# Patient Record
Sex: Female | Born: 1975 | Hispanic: Yes | Smoking: Never smoker
Health system: Southern US, Community
[De-identification: ages and names within clinical notes are randomized; demographics above are authoritative.]

## PROBLEM LIST (undated history)

## (undated) DIAGNOSIS — K3184 Gastroparesis: Secondary | ICD-10-CM

## (undated) DIAGNOSIS — I10 Essential (primary) hypertension: Secondary | ICD-10-CM

## (undated) DIAGNOSIS — G35 Multiple sclerosis: Secondary | ICD-10-CM

## (undated) DIAGNOSIS — R569 Unspecified convulsions: Secondary | ICD-10-CM

## (undated) DIAGNOSIS — K592 Neurogenic bowel, not elsewhere classified: Secondary | ICD-10-CM

## (undated) DIAGNOSIS — G43909 Migraine, unspecified, not intractable, without status migrainosus: Secondary | ICD-10-CM

## (undated) DIAGNOSIS — C439 Malignant melanoma of skin, unspecified: Secondary | ICD-10-CM

## (undated) DIAGNOSIS — E669 Obesity, unspecified: Secondary | ICD-10-CM

## (undated) DIAGNOSIS — N319 Neuromuscular dysfunction of bladder, unspecified: Secondary | ICD-10-CM

## (undated) DIAGNOSIS — E282 Polycystic ovarian syndrome: Secondary | ICD-10-CM

## (undated) DIAGNOSIS — I209 Angina pectoris, unspecified: Secondary | ICD-10-CM

## (undated) DIAGNOSIS — G629 Polyneuropathy, unspecified: Secondary | ICD-10-CM

## (undated) DIAGNOSIS — C55 Malignant neoplasm of uterus, part unspecified: Secondary | ICD-10-CM

## (undated) HISTORY — DX: Malignant melanoma of skin, unspecified: C43.9

## (undated) HISTORY — PX: COLON SURGERY: SHX602

## (undated) HISTORY — DX: Obesity, unspecified: E66.9

## (undated) HISTORY — DX: Multiple sclerosis: G35

## (undated) HISTORY — PX: BARIATRIC SURGERY: SHX1103

## (undated) HISTORY — DX: Neuromuscular dysfunction of bladder, unspecified: N31.9

## (undated) HISTORY — DX: Malignant neoplasm of uterus, part unspecified: C55

## (undated) HISTORY — DX: Neurogenic bowel, not elsewhere classified: K59.2

## (undated) HISTORY — DX: Polycystic ovarian syndrome: E28.2

## (undated) HISTORY — DX: Essential (primary) hypertension: I10

## (undated) HISTORY — PX: SKIN CANCER EXCISION: SHX779

## (undated) HISTORY — PX: DILATION AND CURETTAGE OF UTERUS: SHX78

## (undated) HISTORY — DX: Migraine, unspecified, not intractable, without status migrainosus: G43.909

## (undated) HISTORY — DX: Polyneuropathy, unspecified: G62.9

## (undated) HISTORY — PX: ABDOMINAL HYSTERECTOMY: SHX81

## (undated) HISTORY — PX: CHOLECYSTECTOMY: SHX55

## (undated) HISTORY — DX: Gastroparesis: K31.84

---

## 2004-03-25 ENCOUNTER — Emergency Department (HOSPITAL_COMMUNITY): Admission: EM | Admit: 2004-03-25 | Discharge: 2004-03-26 | Payer: Self-pay | Admitting: Emergency Medicine

## 2004-03-26 ENCOUNTER — Emergency Department (HOSPITAL_COMMUNITY): Admission: EM | Admit: 2004-03-26 | Discharge: 2004-03-26 | Payer: Self-pay | Admitting: Emergency Medicine

## 2004-08-15 ENCOUNTER — Encounter (INDEPENDENT_AMBULATORY_CARE_PROVIDER_SITE_OTHER): Payer: Self-pay | Admitting: *Deleted

## 2004-08-16 ENCOUNTER — Inpatient Hospital Stay (HOSPITAL_COMMUNITY): Admission: AD | Admit: 2004-08-16 | Discharge: 2004-08-17 | Payer: Self-pay | Admitting: Surgery

## 2004-10-09 ENCOUNTER — Encounter (INDEPENDENT_AMBULATORY_CARE_PROVIDER_SITE_OTHER): Payer: Self-pay | Admitting: *Deleted

## 2004-10-09 ENCOUNTER — Ambulatory Visit (HOSPITAL_COMMUNITY): Admission: RE | Admit: 2004-10-09 | Discharge: 2004-10-09 | Payer: Self-pay | Admitting: *Deleted

## 2005-05-21 ENCOUNTER — Ambulatory Visit (HOSPITAL_COMMUNITY): Admission: RE | Admit: 2005-05-21 | Discharge: 2005-05-21 | Payer: Self-pay | Admitting: Obstetrics & Gynecology

## 2005-05-22 ENCOUNTER — Encounter (INDEPENDENT_AMBULATORY_CARE_PROVIDER_SITE_OTHER): Payer: Self-pay | Admitting: *Deleted

## 2005-08-20 ENCOUNTER — Emergency Department (HOSPITAL_COMMUNITY): Admission: EM | Admit: 2005-08-20 | Discharge: 2005-08-20 | Payer: Self-pay | Admitting: Emergency Medicine

## 2006-03-14 ENCOUNTER — Ambulatory Visit: Payer: Self-pay | Admitting: Internal Medicine

## 2006-03-18 ENCOUNTER — Ambulatory Visit: Payer: Self-pay | Admitting: Internal Medicine

## 2006-03-22 ENCOUNTER — Ambulatory Visit: Payer: Self-pay | Admitting: Endocrinology

## 2006-04-01 ENCOUNTER — Encounter: Admission: RE | Admit: 2006-04-01 | Discharge: 2006-04-01 | Payer: Self-pay | Admitting: Internal Medicine

## 2006-04-08 ENCOUNTER — Ambulatory Visit: Payer: Self-pay | Admitting: Internal Medicine

## 2006-04-17 ENCOUNTER — Ambulatory Visit: Payer: Self-pay | Admitting: Internal Medicine

## 2006-05-31 ENCOUNTER — Encounter: Admission: RE | Admit: 2006-05-31 | Discharge: 2006-05-31 | Payer: Self-pay | Admitting: Obstetrics & Gynecology

## 2006-06-03 ENCOUNTER — Encounter: Admission: RE | Admit: 2006-06-03 | Discharge: 2006-06-03 | Payer: Self-pay | Admitting: Obstetrics & Gynecology

## 2006-07-08 ENCOUNTER — Ambulatory Visit: Payer: Self-pay | Admitting: Internal Medicine

## 2006-10-21 ENCOUNTER — Ambulatory Visit: Payer: Self-pay | Admitting: Internal Medicine

## 2006-11-25 ENCOUNTER — Ambulatory Visit: Payer: Self-pay | Admitting: Internal Medicine

## 2006-11-29 ENCOUNTER — Encounter: Payer: Self-pay | Admitting: Internal Medicine

## 2006-11-29 LAB — CONVERTED CEMR LAB

## 2007-01-07 ENCOUNTER — Ambulatory Visit: Payer: Self-pay | Admitting: Internal Medicine

## 2007-01-16 ENCOUNTER — Ambulatory Visit: Payer: Self-pay | Admitting: Internal Medicine

## 2007-04-04 ENCOUNTER — Ambulatory Visit: Payer: Self-pay | Admitting: Internal Medicine

## 2007-06-24 ENCOUNTER — Ambulatory Visit: Payer: Self-pay | Admitting: Internal Medicine

## 2007-06-27 ENCOUNTER — Encounter: Payer: Self-pay | Admitting: Internal Medicine

## 2007-06-27 DIAGNOSIS — E119 Type 2 diabetes mellitus without complications: Secondary | ICD-10-CM | POA: Insufficient documentation

## 2007-06-27 DIAGNOSIS — I10 Essential (primary) hypertension: Secondary | ICD-10-CM | POA: Insufficient documentation

## 2007-06-27 DIAGNOSIS — J45909 Unspecified asthma, uncomplicated: Secondary | ICD-10-CM | POA: Insufficient documentation

## 2007-06-27 DIAGNOSIS — K219 Gastro-esophageal reflux disease without esophagitis: Secondary | ICD-10-CM | POA: Insufficient documentation

## 2007-08-18 LAB — CONVERTED CEMR LAB: Pap Smear: NORMAL

## 2007-12-31 ENCOUNTER — Ambulatory Visit: Payer: Self-pay | Admitting: Internal Medicine

## 2007-12-31 DIAGNOSIS — J069 Acute upper respiratory infection, unspecified: Secondary | ICD-10-CM | POA: Insufficient documentation

## 2007-12-31 LAB — CONVERTED CEMR LAB
ALT: 167 units/L — ABNORMAL HIGH (ref 0–35)
AST: 117 units/L — ABNORMAL HIGH (ref 0–37)
Albumin: 3.8 g/dL (ref 3.5–5.2)
Alkaline Phosphatase: 67 units/L (ref 39–117)
BUN: 3 mg/dL — ABNORMAL LOW (ref 6–23)
Basophils Absolute: 0 10*3/uL (ref 0.0–0.1)
Basophils Relative: 0.2 % (ref 0.0–1.0)
Bilirubin, Direct: 0.2 mg/dL (ref 0.0–0.3)
CO2: 29 meq/L (ref 19–32)
Calcium: 9.2 mg/dL (ref 8.4–10.5)
Chloride: 102 meq/L (ref 96–112)
Cholesterol: 265 mg/dL (ref 0–200)
Creatinine, Ser: 0.6 mg/dL (ref 0.4–1.2)
Creatinine,U: 233.8 mg/dL
Direct LDL: 203.2 mg/dL
Eosinophils Absolute: 0.2 10*3/uL (ref 0.0–0.6)
Eosinophils Relative: 2.4 % (ref 0.0–5.0)
GFR calc Af Amer: 151 mL/min
GFR calc non Af Amer: 125 mL/min
Glucose, Bld: 147 mg/dL — ABNORMAL HIGH (ref 70–99)
HCT: 41.2 % (ref 36.0–46.0)
HDL: 36.7 mg/dL — ABNORMAL LOW (ref >39.0)
Hemoglobin: 14.2 g/dL (ref 12.0–15.0)
Hgb A1c MFr Bld: 6.9 % — ABNORMAL HIGH (ref 4.6–6.0)
Lymphocytes Relative: 38.3 % (ref 12.0–46.0)
MCHC: 34.5 g/dL (ref 30.0–36.0)
MCV: 91.1 fL (ref 78.0–100.0)
Microalb Creat Ratio: 6 mg/g (ref 0.0–30.0)
Microalb, Ur: 1.4 mg/dL (ref 0.0–1.9)
Monocytes Absolute: 0.7 10*3/uL (ref 0.2–0.7)
Monocytes Relative: 7.5 % (ref 3.0–11.0)
Neutro Abs: 4.6 10*3/uL (ref 1.4–7.7)
Neutrophils Relative %: 51.6 % (ref 43.0–77.0)
Platelets: 331 10*3/uL (ref 150–400)
Potassium: 3.9 meq/L (ref 3.5–5.1)
RBC: 4.52 M/uL (ref 3.87–5.11)
RDW: 11.8 % (ref 11.5–14.6)
Rapid Strep: NEGATIVE
Sodium: 138 meq/L (ref 135–145)
TSH: 1.14 microintl units/mL (ref 0.35–5.50)
Total Bilirubin: 0.8 mg/dL (ref 0.3–1.2)
Total CHOL/HDL Ratio: 7.2
Total Protein: 7.7 g/dL (ref 6.0–8.3)
Triglycerides: 132 mg/dL (ref 0–149)
VLDL: 26 mg/dL (ref 0–40)
WBC: 8.9 10*3/uL (ref 4.5–10.5)

## 2008-01-07 ENCOUNTER — Encounter: Payer: Self-pay | Admitting: Internal Medicine

## 2008-01-19 ENCOUNTER — Ambulatory Visit: Payer: Self-pay | Admitting: Internal Medicine

## 2008-01-19 DIAGNOSIS — E785 Hyperlipidemia, unspecified: Secondary | ICD-10-CM | POA: Insufficient documentation

## 2008-01-19 DIAGNOSIS — R945 Abnormal results of liver function studies: Secondary | ICD-10-CM | POA: Insufficient documentation

## 2008-01-21 ENCOUNTER — Encounter: Payer: Self-pay | Admitting: Internal Medicine

## 2008-01-21 LAB — CONVERTED CEMR LAB
Ferritin: 149.4 ng/mL (ref 10.0–291.0)
Iron: 65 ug/dL (ref 42–145)
Saturation Ratios: 18 % — ABNORMAL LOW (ref 20.0–50.0)
Transferrin: 257.4 mg/dL (ref >=212.0)

## 2008-01-22 ENCOUNTER — Encounter: Payer: Self-pay | Admitting: Internal Medicine

## 2008-01-26 HISTORY — PX: LAPAROSCOPIC GASTRIC BANDING: SHX1100

## 2008-01-27 ENCOUNTER — Encounter: Admission: RE | Admit: 2008-01-27 | Discharge: 2008-01-27 | Payer: Self-pay | Admitting: Internal Medicine

## 2008-01-27 DIAGNOSIS — K7689 Other specified diseases of liver: Secondary | ICD-10-CM | POA: Insufficient documentation

## 2008-01-28 ENCOUNTER — Encounter (INDEPENDENT_AMBULATORY_CARE_PROVIDER_SITE_OTHER): Payer: Self-pay | Admitting: *Deleted

## 2008-01-28 ENCOUNTER — Telehealth: Payer: Self-pay | Admitting: Internal Medicine

## 2008-01-29 LAB — CONVERTED CEMR LAB
A-1 Antitrypsin, Ser: 140 mg/dL (ref 83–200)
Albumin ELP: 55.4 % — ABNORMAL LOW (ref 55.8–66.1)
Alpha-1-Globulin: 3.5 % (ref 2.9–4.9)
Alpha-2-Globulin: 9.8 % (ref 7.1–11.8)
Anti Nuclear Antibody(ANA): NEGATIVE
Beta Globulin: 5.9 % (ref 4.7–7.2)
Ceruloplasmin: 40 mg/dL (ref 21–63)
Gamma Globulin: 19.2 % — ABNORMAL HIGH (ref 11.1–18.8)
HCV Ab: NEGATIVE
Hep B Core Total Ab: NEGATIVE
Hep B S Ab: NEGATIVE
Total Protein, Serum Electrophoresis: 8.3 g/dL (ref 6.0–8.3)

## 2008-02-09 ENCOUNTER — Ambulatory Visit: Payer: Self-pay | Admitting: Internal Medicine

## 2008-02-23 ENCOUNTER — Ambulatory Visit: Payer: Self-pay | Admitting: Internal Medicine

## 2008-02-23 DIAGNOSIS — R1084 Generalized abdominal pain: Secondary | ICD-10-CM | POA: Insufficient documentation

## 2008-03-02 ENCOUNTER — Ambulatory Visit: Payer: Self-pay | Admitting: Internal Medicine

## 2008-03-02 DIAGNOSIS — R5383 Other fatigue: Secondary | ICD-10-CM | POA: Insufficient documentation

## 2008-03-02 DIAGNOSIS — R5381 Other malaise: Secondary | ICD-10-CM | POA: Insufficient documentation

## 2008-03-02 DIAGNOSIS — R630 Anorexia: Secondary | ICD-10-CM | POA: Insufficient documentation

## 2008-03-17 ENCOUNTER — Ambulatory Visit: Payer: Self-pay | Admitting: Internal Medicine

## 2008-03-17 LAB — CONVERTED CEMR LAB
ALT: 95 units/L — ABNORMAL HIGH (ref 0–35)
AST: 56 units/L — ABNORMAL HIGH (ref 0–37)
Albumin: 3.9 g/dL (ref 3.5–5.2)
Alkaline Phosphatase: 69 units/L (ref 39–117)
BUN: 8 mg/dL (ref 6–23)
Basophils Absolute: 0 10*3/uL (ref 0.0–0.1)
Basophils Relative: 0.3 % (ref 0.0–1.0)
Bilirubin, Direct: 0.1 mg/dL (ref 0.0–0.3)
CO2: 30 meq/L (ref 19–32)
Calcium: 9.4 mg/dL (ref 8.4–10.5)
Chloride: 102 meq/L (ref 96–112)
Creatinine, Ser: 0.5 mg/dL (ref 0.4–1.2)
Eosinophils Absolute: 0.2 10*3/uL (ref 0.0–0.7)
Eosinophils Relative: 1.7 % (ref 0.0–5.0)
GFR calc Af Amer: 186 mL/min
GFR calc non Af Amer: 154 mL/min
Glucose, Bld: 136 mg/dL — ABNORMAL HIGH (ref 70–99)
HCT: 41.4 % (ref 36.0–46.0)
Hemoglobin: 14.2 g/dL (ref 12.0–15.0)
Lymphocytes Relative: 36.7 % (ref 12.0–46.0)
MCHC: 34.2 g/dL (ref 30.0–36.0)
MCV: 91.3 fL (ref 78.0–100.0)
Monocytes Absolute: 0.8 10*3/uL (ref 0.1–1.0)
Monocytes Relative: 7.9 % (ref 3.0–12.0)
Neutro Abs: 5.6 10*3/uL (ref 1.4–7.7)
Neutrophils Relative %: 53.4 % (ref 43.0–77.0)
Platelets: 345 10*3/uL (ref 150–400)
Potassium: 3.6 meq/L (ref 3.5–5.1)
RBC: 4.54 M/uL (ref 3.87–5.11)
RDW: 12 % (ref 11.5–14.6)
Sed Rate: 43 mm/hr — ABNORMAL HIGH (ref 0–22)
Sodium: 137 meq/L (ref 135–145)
Total Bilirubin: 0.6 mg/dL (ref 0.3–1.2)
Total Protein: 7.9 g/dL (ref 6.0–8.3)
WBC: 10.4 10*3/uL (ref 4.5–10.5)

## 2008-03-19 ENCOUNTER — Encounter: Payer: Self-pay | Admitting: Internal Medicine

## 2008-03-29 ENCOUNTER — Ambulatory Visit: Payer: Self-pay | Admitting: Internal Medicine

## 2008-03-29 DIAGNOSIS — R0609 Other forms of dyspnea: Secondary | ICD-10-CM | POA: Insufficient documentation

## 2008-03-29 DIAGNOSIS — R0989 Other specified symptoms and signs involving the circulatory and respiratory systems: Secondary | ICD-10-CM | POA: Insufficient documentation

## 2008-04-06 ENCOUNTER — Ambulatory Visit: Payer: Self-pay | Admitting: Internal Medicine

## 2008-04-07 ENCOUNTER — Ambulatory Visit (HOSPITAL_BASED_OUTPATIENT_CLINIC_OR_DEPARTMENT_OTHER): Admission: RE | Admit: 2008-04-07 | Discharge: 2008-04-07 | Payer: Self-pay | Admitting: Internal Medicine

## 2008-04-07 ENCOUNTER — Encounter: Payer: Self-pay | Admitting: Internal Medicine

## 2008-04-13 ENCOUNTER — Encounter: Payer: Self-pay | Admitting: Internal Medicine

## 2008-04-14 ENCOUNTER — Telehealth: Payer: Self-pay | Admitting: Internal Medicine

## 2008-04-15 ENCOUNTER — Encounter: Payer: Self-pay | Admitting: Internal Medicine

## 2008-04-22 ENCOUNTER — Ambulatory Visit: Payer: Self-pay | Admitting: Pulmonary Disease

## 2008-04-29 ENCOUNTER — Telehealth: Payer: Self-pay | Admitting: Internal Medicine

## 2008-07-28 ENCOUNTER — Encounter: Payer: Self-pay | Admitting: Internal Medicine

## 2008-08-06 ENCOUNTER — Ambulatory Visit (HOSPITAL_COMMUNITY): Admission: RE | Admit: 2008-08-06 | Discharge: 2008-08-06 | Payer: Self-pay | Admitting: Surgery

## 2008-08-20 ENCOUNTER — Emergency Department (HOSPITAL_COMMUNITY): Admission: EM | Admit: 2008-08-20 | Discharge: 2008-08-20 | Payer: Self-pay | Admitting: Emergency Medicine

## 2008-09-02 ENCOUNTER — Ambulatory Visit: Payer: Self-pay | Admitting: Internal Medicine

## 2008-09-02 DIAGNOSIS — M7710 Lateral epicondylitis, unspecified elbow: Secondary | ICD-10-CM | POA: Insufficient documentation

## 2008-09-02 LAB — CONVERTED CEMR LAB
BUN: 4 mg/dL — ABNORMAL LOW (ref 6–23)
CO2: 26 meq/L (ref 19–32)
Calcium: 9.1 mg/dL (ref 8.4–10.5)
Chloride: 101 meq/L (ref 96–112)
Creatinine, Ser: 0.6 mg/dL (ref 0.4–1.2)
Creatinine,U: 265.1 mg/dL
GFR calc Af Amer: 150 mL/min
GFR calc non Af Amer: 124 mL/min
Glucose, Bld: 183 mg/dL — ABNORMAL HIGH (ref 70–99)
Hgb A1c MFr Bld: 7.3 % — ABNORMAL HIGH (ref 4.6–6.0)
Microalb Creat Ratio: 12.4 mg/g (ref 0.0–30.0)
Microalb, Ur: 3.3 mg/dL — ABNORMAL HIGH (ref 0.0–1.9)
Potassium: 4 meq/L (ref 3.5–5.1)
Sodium: 136 meq/L (ref 135–145)
TSH: 0.98 microintl units/mL (ref 0.35–5.50)

## 2008-09-03 ENCOUNTER — Encounter: Payer: Self-pay | Admitting: Internal Medicine

## 2008-09-08 ENCOUNTER — Encounter: Payer: Self-pay | Admitting: Internal Medicine

## 2008-09-17 ENCOUNTER — Ambulatory Visit: Payer: Self-pay | Admitting: Internal Medicine

## 2008-09-17 DIAGNOSIS — H66009 Acute suppurative otitis media without spontaneous rupture of ear drum, unspecified ear: Secondary | ICD-10-CM | POA: Insufficient documentation

## 2008-09-17 LAB — CONVERTED CEMR LAB: Rapid Strep: NEGATIVE

## 2008-09-24 ENCOUNTER — Encounter: Admission: RE | Admit: 2008-09-24 | Discharge: 2008-09-24 | Payer: Self-pay | Admitting: Surgery

## 2008-10-11 ENCOUNTER — Encounter: Payer: Self-pay | Admitting: Internal Medicine

## 2008-10-18 ENCOUNTER — Ambulatory Visit: Payer: Self-pay | Admitting: Internal Medicine

## 2008-10-18 LAB — CONVERTED CEMR LAB
ALT: 154 units/L — ABNORMAL HIGH (ref 0–35)
AST: 83 units/L — ABNORMAL HIGH (ref 0–37)
Albumin: 3.8 g/dL (ref 3.5–5.2)
Alkaline Phosphatase: 75 units/L (ref 39–117)
BUN: 7 mg/dL (ref 6–23)
Basophils Absolute: 0.1 10*3/uL (ref 0.0–0.1)
Basophils Relative: 1.1 % (ref 0.0–3.0)
Bilirubin, Direct: 0.1 mg/dL (ref 0.0–0.3)
CO2: 27 meq/L (ref 19–32)
Calcium: 9 mg/dL (ref 8.4–10.5)
Chloride: 103 meq/L (ref 96–112)
Creatinine, Ser: 0.6 mg/dL (ref 0.4–1.2)
Eosinophils Absolute: 0.1 10*3/uL (ref 0.0–0.7)
Eosinophils Relative: 1.3 % (ref 0.0–5.0)
GFR calc Af Amer: 150 mL/min
GFR calc non Af Amer: 124 mL/min
Glucose, Bld: 135 mg/dL — ABNORMAL HIGH (ref 70–99)
HCT: 41.8 % (ref 36.0–46.0)
Hemoglobin: 14.4 g/dL (ref 12.0–15.0)
Lymphocytes Relative: 31.3 % (ref 12.0–46.0)
MCHC: 34.4 g/dL (ref 30.0–36.0)
MCV: 92.6 fL (ref 78.0–100.0)
Mono Screen: NEGATIVE
Monocytes Absolute: 0.3 10*3/uL (ref 0.1–1.0)
Monocytes Relative: 2.5 % — ABNORMAL LOW (ref 3.0–12.0)
Neutro Abs: 7.1 10*3/uL (ref 1.4–7.7)
Neutrophils Relative %: 63.8 % (ref 43.0–77.0)
Platelets: 303 10*3/uL (ref 150–400)
Potassium: 3.8 meq/L (ref 3.5–5.1)
RBC: 4.52 M/uL (ref 3.87–5.11)
RDW: 12 % (ref 11.5–14.6)
Sodium: 137 meq/L (ref 135–145)
TSH: 2.23 microintl units/mL (ref 0.35–5.50)
Total Bilirubin: 0.5 mg/dL (ref 0.3–1.2)
Total Protein: 7.9 g/dL (ref 6.0–8.3)
WBC: 11 10*3/uL — ABNORMAL HIGH (ref 4.5–10.5)

## 2008-10-19 ENCOUNTER — Telehealth: Payer: Self-pay | Admitting: Internal Medicine

## 2008-10-21 ENCOUNTER — Encounter: Payer: Self-pay | Admitting: Internal Medicine

## 2008-10-25 ENCOUNTER — Ambulatory Visit (HOSPITAL_COMMUNITY): Admission: RE | Admit: 2008-10-25 | Discharge: 2008-10-25 | Payer: Self-pay | Admitting: Surgery

## 2008-11-24 ENCOUNTER — Ambulatory Visit: Payer: Self-pay | Admitting: Internal Medicine

## 2008-11-24 DIAGNOSIS — R209 Unspecified disturbances of skin sensation: Secondary | ICD-10-CM | POA: Insufficient documentation

## 2008-11-24 LAB — CONVERTED CEMR LAB
Creatinine,U: 357.4 mg/dL
Folate: 9.8 ng/mL
Hgb A1c MFr Bld: 7.2 % — ABNORMAL HIGH (ref 4.6–6.0)
Microalb Creat Ratio: 9.5 mg/g (ref 0.0–30.0)
Microalb, Ur: 3.4 mg/dL — ABNORMAL HIGH (ref 0.0–1.9)
Vitamin B-12: 397 pg/mL (ref 211–911)

## 2008-12-03 DIAGNOSIS — G35 Multiple sclerosis: Secondary | ICD-10-CM

## 2008-12-03 HISTORY — DX: Multiple sclerosis: G35

## 2008-12-17 ENCOUNTER — Ambulatory Visit: Payer: Self-pay | Admitting: Internal Medicine

## 2008-12-17 DIAGNOSIS — J209 Acute bronchitis, unspecified: Secondary | ICD-10-CM | POA: Insufficient documentation

## 2008-12-22 ENCOUNTER — Telehealth: Payer: Self-pay | Admitting: Internal Medicine

## 2008-12-23 ENCOUNTER — Ambulatory Visit: Payer: Self-pay | Admitting: Internal Medicine

## 2008-12-23 DIAGNOSIS — R350 Frequency of micturition: Secondary | ICD-10-CM | POA: Insufficient documentation

## 2008-12-23 LAB — CONVERTED CEMR LAB
Bilirubin Urine: NEGATIVE
Glucose, Urine, Semiquant: NEGATIVE
Ketones, urine, test strip: NEGATIVE
Nitrite: NEGATIVE
Protein, U semiquant: 30
Specific Gravity, Urine: >=1.03
Urobilinogen, UA: 0.2
WBC Urine, dipstick: NEGATIVE
pH: 6

## 2009-01-04 ENCOUNTER — Ambulatory Visit: Payer: Self-pay | Admitting: Diagnostic Radiology

## 2009-01-04 ENCOUNTER — Ambulatory Visit (HOSPITAL_BASED_OUTPATIENT_CLINIC_OR_DEPARTMENT_OTHER): Admission: RE | Admit: 2009-01-04 | Discharge: 2009-01-04 | Payer: Self-pay | Admitting: Internal Medicine

## 2009-01-04 ENCOUNTER — Ambulatory Visit: Payer: Self-pay | Admitting: Internal Medicine

## 2009-01-11 ENCOUNTER — Encounter: Admission: RE | Admit: 2009-01-11 | Discharge: 2009-02-08 | Payer: Self-pay | Admitting: Surgery

## 2009-01-17 ENCOUNTER — Telehealth (INDEPENDENT_AMBULATORY_CARE_PROVIDER_SITE_OTHER): Payer: Self-pay | Admitting: *Deleted

## 2009-01-19 ENCOUNTER — Encounter: Payer: Self-pay | Admitting: Internal Medicine

## 2009-01-19 LAB — HM DIABETES EYE EXAM: HM Diabetic Eye Exam: NORMAL

## 2009-01-20 ENCOUNTER — Encounter: Payer: Self-pay | Admitting: Internal Medicine

## 2009-01-21 ENCOUNTER — Encounter: Payer: Self-pay | Admitting: Internal Medicine

## 2009-01-25 ENCOUNTER — Ambulatory Visit (HOSPITAL_COMMUNITY): Admission: RE | Admit: 2009-01-25 | Discharge: 2009-01-26 | Payer: Self-pay | Admitting: Surgery

## 2009-02-07 ENCOUNTER — Ambulatory Visit: Payer: Self-pay | Admitting: Internal Medicine

## 2009-02-07 DIAGNOSIS — J309 Allergic rhinitis, unspecified: Secondary | ICD-10-CM | POA: Insufficient documentation

## 2009-02-07 LAB — HM DIABETES FOOT EXAM

## 2009-02-18 ENCOUNTER — Encounter: Payer: Self-pay | Admitting: Internal Medicine

## 2009-03-15 ENCOUNTER — Encounter: Payer: Self-pay | Admitting: Internal Medicine

## 2009-03-23 ENCOUNTER — Ambulatory Visit: Payer: Self-pay | Admitting: Internal Medicine

## 2009-03-23 DIAGNOSIS — J45901 Unspecified asthma with (acute) exacerbation: Secondary | ICD-10-CM | POA: Insufficient documentation

## 2009-05-13 ENCOUNTER — Emergency Department (HOSPITAL_COMMUNITY): Admission: EM | Admit: 2009-05-13 | Discharge: 2009-05-14 | Payer: Self-pay | Admitting: Emergency Medicine

## 2009-06-10 ENCOUNTER — Inpatient Hospital Stay (HOSPITAL_COMMUNITY): Admission: EM | Admit: 2009-06-10 | Discharge: 2009-06-13 | Payer: Self-pay | Admitting: Emergency Medicine

## 2009-06-10 ENCOUNTER — Telehealth: Payer: Self-pay | Admitting: Internal Medicine

## 2009-06-15 ENCOUNTER — Encounter: Admission: RE | Admit: 2009-06-15 | Discharge: 2009-08-16 | Payer: Self-pay | Admitting: Neurology

## 2009-06-16 ENCOUNTER — Ambulatory Visit: Payer: Self-pay | Admitting: Family Medicine

## 2009-06-16 ENCOUNTER — Telehealth (INDEPENDENT_AMBULATORY_CARE_PROVIDER_SITE_OTHER): Payer: Self-pay | Admitting: *Deleted

## 2009-06-16 DIAGNOSIS — G35 Multiple sclerosis: Secondary | ICD-10-CM | POA: Insufficient documentation

## 2009-06-23 ENCOUNTER — Encounter: Admission: RE | Admit: 2009-06-23 | Discharge: 2009-06-23 | Payer: Self-pay | Admitting: Neurology

## 2009-06-28 ENCOUNTER — Encounter: Admission: RE | Admit: 2009-06-28 | Discharge: 2009-09-26 | Payer: Self-pay | Admitting: Surgery

## 2009-07-04 ENCOUNTER — Encounter: Payer: Self-pay | Admitting: Internal Medicine

## 2009-07-06 ENCOUNTER — Telehealth: Payer: Self-pay | Admitting: Internal Medicine

## 2009-07-11 ENCOUNTER — Ambulatory Visit: Payer: Self-pay | Admitting: Internal Medicine

## 2009-07-11 DIAGNOSIS — G0490 Encephalitis and encephalomyelitis, unspecified: Secondary | ICD-10-CM | POA: Insufficient documentation

## 2009-07-11 DIAGNOSIS — G0491 Myelitis, unspecified: Secondary | ICD-10-CM

## 2009-08-02 ENCOUNTER — Encounter: Admission: RE | Admit: 2009-08-02 | Discharge: 2009-09-01 | Payer: Self-pay | Admitting: Internal Medicine

## 2009-09-05 ENCOUNTER — Emergency Department (HOSPITAL_COMMUNITY): Admission: EM | Admit: 2009-09-05 | Discharge: 2009-09-06 | Payer: Self-pay | Admitting: Emergency Medicine

## 2009-09-08 ENCOUNTER — Encounter: Payer: Self-pay | Admitting: Internal Medicine

## 2010-01-12 ENCOUNTER — Ambulatory Visit: Payer: Self-pay | Admitting: Internal Medicine

## 2010-01-12 DIAGNOSIS — J40 Bronchitis, not specified as acute or chronic: Secondary | ICD-10-CM | POA: Insufficient documentation

## 2010-01-19 ENCOUNTER — Ambulatory Visit: Payer: Self-pay | Admitting: Internal Medicine

## 2010-01-19 DIAGNOSIS — J329 Chronic sinusitis, unspecified: Secondary | ICD-10-CM | POA: Insufficient documentation

## 2010-03-09 ENCOUNTER — Ambulatory Visit: Payer: Self-pay | Admitting: Family Medicine

## 2010-03-17 ENCOUNTER — Telehealth (INDEPENDENT_AMBULATORY_CARE_PROVIDER_SITE_OTHER): Payer: Self-pay | Admitting: *Deleted

## 2010-03-19 ENCOUNTER — Emergency Department (HOSPITAL_COMMUNITY): Admission: EM | Admit: 2010-03-19 | Discharge: 2010-03-19 | Payer: Self-pay | Admitting: Emergency Medicine

## 2010-03-20 ENCOUNTER — Ambulatory Visit: Payer: Self-pay | Admitting: Internal Medicine

## 2010-04-06 ENCOUNTER — Telehealth: Payer: Self-pay | Admitting: Internal Medicine

## 2010-06-18 ENCOUNTER — Emergency Department (HOSPITAL_COMMUNITY): Admission: EM | Admit: 2010-06-18 | Discharge: 2010-06-18 | Payer: Self-pay | Admitting: Family Medicine

## 2010-08-28 ENCOUNTER — Ambulatory Visit: Payer: Self-pay | Admitting: Internal Medicine

## 2010-09-04 ENCOUNTER — Ambulatory Visit: Payer: Self-pay | Admitting: Internal Medicine

## 2010-09-04 LAB — CONVERTED CEMR LAB
ALT: 51 units/L — ABNORMAL HIGH (ref 0–35)
AST: 27 units/L (ref 0–37)
Albumin: 4.5 g/dL (ref 3.5–5.2)
Alkaline Phosphatase: 74 units/L (ref 39–117)
BUN: 8 mg/dL (ref 6–23)
Bilirubin, Direct: 0.1 mg/dL (ref 0.0–0.3)
CO2: 25 meq/L (ref 19–32)
Calcium: 9 mg/dL (ref 8.4–10.5)
Chloride: 105 meq/L (ref 96–112)
Creatinine, Ser: 0.58 mg/dL (ref 0.40–1.20)
Glucose, Bld: 152 mg/dL — ABNORMAL HIGH (ref 70–99)
HCT: 43.5 % (ref 36.0–46.0)
Hemoglobin: 15.1 g/dL — ABNORMAL HIGH (ref 12.0–15.0)
Hgb A1c MFr Bld: 6.7 % — ABNORMAL HIGH (ref <5.7)
Indirect Bilirubin: 0.3 mg/dL (ref 0.0–0.9)
MCHC: 34.7 g/dL (ref 30.0–36.0)
MCV: 89.5 fL (ref 78.0–100.0)
Platelets: 357 10*3/uL (ref 150–400)
Potassium: 4.5 meq/L (ref 3.5–5.3)
RBC: 4.86 M/uL (ref 3.87–5.11)
RDW: 12.9 % (ref 11.5–15.5)
Sodium: 139 meq/L (ref 135–145)
TSH: 1.56 microintl units/mL (ref 0.350–4.500)
Total Bilirubin: 0.4 mg/dL (ref 0.3–1.2)
Total Protein: 7.4 g/dL (ref 6.0–8.3)
WBC: 9.4 10*3/uL (ref 4.0–10.5)

## 2010-09-05 ENCOUNTER — Encounter: Payer: Self-pay | Admitting: Internal Medicine

## 2010-09-07 ENCOUNTER — Encounter: Payer: Self-pay | Admitting: Internal Medicine

## 2010-09-11 ENCOUNTER — Encounter: Payer: Self-pay | Admitting: Internal Medicine

## 2010-09-11 LAB — CONVERTED CEMR LAB
Collection Interval-CRCL: 24 hr
Creatinine 24 HR UR: 986 mg/24hr (ref 700–1800)
Creatinine Clearance: 122 mL/min — ABNORMAL HIGH (ref 75–115)
Creatinine, Urine: 164.3 mg/dL

## 2010-09-13 ENCOUNTER — Encounter: Payer: Self-pay | Admitting: Internal Medicine

## 2010-09-21 ENCOUNTER — Encounter: Admission: RE | Admit: 2010-09-21 | Discharge: 2010-09-21 | Payer: Self-pay | Admitting: Neurology

## 2010-12-03 ENCOUNTER — Emergency Department (HOSPITAL_COMMUNITY)
Admission: EM | Admit: 2010-12-03 | Discharge: 2010-12-03 | Payer: Self-pay | Source: Home / Self Care | Admitting: Emergency Medicine

## 2010-12-15 ENCOUNTER — Ambulatory Visit
Admission: RE | Admit: 2010-12-15 | Discharge: 2010-12-15 | Payer: Self-pay | Source: Home / Self Care | Attending: Internal Medicine | Admitting: Internal Medicine

## 2010-12-15 DIAGNOSIS — G43909 Migraine, unspecified, not intractable, without status migrainosus: Secondary | ICD-10-CM | POA: Insufficient documentation

## 2010-12-24 ENCOUNTER — Encounter: Payer: Self-pay | Admitting: Obstetrics & Gynecology

## 2011-01-02 NOTE — Assessment & Plan Note (Signed)
Summary: 2:45 appt.-Not feeling any better- jr   Vital Signs:  Patient profile:   35 year old female Weight:      256.50 pounds BMI:     44.19 O2 Sat:      97 % on Room air Temp:     98.2 degrees F oral Pulse rate:   98 / minute Pulse rhythm:   regular Resp:     18 per minute BP sitting:   120 / 72  (right arm) Cuff size:   large  Vitals Entered By: Glendell Docker CMA (January 19, 2010 2:59 PM)  O2 Flow:  Room air  Primary Care Provider:  D. Thomos Lemons DO  CC:  Cough.  History of Present Illness:   35 y/o for f/u re:  cough.  no significant improvement with doxy.  cough worse with laying down. sinus congestion.  cough is production.  dulera helping.  Allergies: 1)  ! Actos (Pioglitazone Hcl)  Past History:  Past Medical History: Asthma Diabetes mellitus, type II GERD Hypertension  PCOS  Gastroparesis Fatty Liver (NA[facility])    Polyneuropathy      Multple Sclerosis  (Dr Anne Hahn)  Past Surgical History: Cholecystectomy D&C        Lap Band surgery - 01/26/2008   Family History: Mother has diabetes, hypertension, and depresson Father has history of liver disease.  He died of cirrhosis. Family history of CAD Family history of ovarian cancer  Daughter with bipolar depression        Social History: Married Never Smoked   Alcohol use-no        works at [employer]   Physical Exam  General:  alert, well-developed, and well-nourished.   Ears:  rt and left TM retracted Mouth:  no exudates and pharyngeal erythema.   Lungs:  normal respiratory effort, normal breath sounds, and no wheezes.   Heart:  normal rate, regular rhythm, and no gallop.     Impression & Recommendations:  Problem # 1:  SINUSITIS (ICD-473.9) Pt with persistent cough that is worse with laying.  lungs are clear.  I suspect sinusitis.  take avelox as directed.  Patient advised to call office if symptoms persist or worsen.  Her updated medication list for this problem includes:    Avelox 400 Mg  Tabs (Moxifloxacin hcl) ..... One by mouth once daily  Complete Medication List: 1)  Onetouch Ultra Test Strp (Glucose blood) .... For once daily testing 2)  Riomet 500 Mg/37ml Soln (Metformin hcl) .[name].. 10 ml twice daily 3)  Handicapt Accessible Shower Head  .... Use as directed  dx: ms 4)  Wheelchair Misc (Misc. devices) .... Dx ms 5)  Shower Chair  .... Dx: ms 6)  Rolling Walker  .... Use as directed dx: ms 7)  Bd Insulin Syringe 27.5g X 5/8" 2 Ml Misc (Insulin syringe-needle u-100) .... Use as directed 8)  Neurontin 600 Mg Tabs (Gabapentin) .[name].. 1 by mouth tid 9)  Adderall Xr 20 Mg Xr24h-cap (Amphetamine-dextroamphetamine) .... Take 1 tablet by mouth every morning 10)  Rebif 22 Mcg/0.10ml Soln (Interferon beta-1a) .... 3x per week--mon, wed, sat 11)  Avelox 400 Mg Tabs (Moxifloxacin hcl) .... One by mouth once daily 12)  Dulera 100-5 Mcg/act Aero (Mometasone furo-formoterol fum) .... 2 puffs two times a day  Patient Instructions: 1)  Call our office if your symptoms do not  improve or gets worse. Prescriptions: AVELOX 400 MG TABS (MOXIFLOXACIN HCL) one by mouth once daily  #10 x 0   Entered  and Authorized by:   D. Thomos Lemons DO   Signed by:   D. Thomos Lemons DO on 01/19/2010   Method used:   Electronically to        [facility]. [phone]* (retail)       [address]       [place], [place]  [address]       Ph: [phone]       Fax: [phone]   RxID:   [id number]   Current Allergies (reviewed today): ! ACTOS (PIOGLITAZONE HCL)

## 2011-01-02 NOTE — Assessment & Plan Note (Signed)
Summary: f/u Re: M.S. & Diabetes, - jr   Vital Signs:  Patient profile:   35 year old female Weight:      246.75 pounds BMI:     42.51 Temp:     98.4 degrees F oral Pulse rate:   84 / minute Pulse rhythm:   regular Resp:     18 per minute BP sitting:   114 / 80  (right arm) Cuff size:   large  Vitals Entered By: Glendell Docker CMA (July 11, 2009 3:47 PM)  Primary Care Provider:  Dondra Spry DO  CC:  Discuss Health Status.  History of Present Illness: Discuss Health Status 35 y/o hispanic female for follow up.  Pt admitted 06/10/2009 and diagnosed with transverse myelitis.   Hospital records reviewed.   She has been treated with steroids.  Her lower ext somewhat improved but she still has to walk with walker.  She is very unsteady.     Her blood sugars exacerbated by steroids but now returning to normal.  Her brother concerned her symptoms may be secondary to Lyme disease.   Hosp w/u included Lyme titer which was negative.  Allergies: 1)  ! Actos (Pioglitazone Hcl)  Past History:  Past Medical History: Asthma Diabetes mellitus, type II GERD Hypertension PCOS  Gastroparesis Fatty Liver (NA[facility])    Polyneuropathy      Multple Sclerosis  (Dr Anne Hahn)  Family History: Mother has diabetes, hypertension, and depresson Father has history of liver disease.  He died of cirrhosis. Family history of CAD Family history of ovarian cancer  Daughter with bipolar depression       Social History: Married Never Smoked   Alcohol use-no        works at [employer]  Review of Systems       The patient complains of dyspnea on exertion.  The patient denies chest pain.    Physical Exam  General:  alert and overweight-appearing.   Head:  normocephalic and atraumatic.   Eyes:  vision grossly intact, pupils equal, pupils round, and pupils reactive to light.   Neck:  no masses.   Lungs:  Normal respiratory effort, chest expands symmetrically. Lungs are clear to auscultation, no  crackles or wheezes. Heart:  Normal rate and regular rhythm. S1 and S2 normal without gallop, murmur, click, rub or other extra sounds. Abdomen:  soft, non-tender, and normal bowel sounds.   Extremities:  trace ankle edema bilat Neurologic:  alert & oriented X3 and cranial nerves II-XII intact.  wide based unsteady gait.  ambulates with walker Psych:  normally interactive, good eye contact, not anxious appearing, and not depressed appearing.     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  She does not recall tick bite but [place]   Pt considering traveling to [facility] to meet with Lyme specialist.   Orders: T- * Misc. Laboratory test [phone]) T- * Misc. Laboratory test [phone]) Physical Therapy Referral (PT)  Problem # 2:  DIABETES MELLITUS, TYPE II (ICD-250.00) BS exacerbated by steroids.  Monitor A1c.  Her updated medication list for this problem includes:    Riomet 500 Mg/62ml Soln (Metformin hcl) .Marland KitchenMarland KitchenMarland KitchenMarland Kitchen 10 ml twice daily    Novolog 100 Unit/ml Soln (Insulin aspart) ..... Use as directed for sliding scale insulin  Orders: T-Basic Metabolic Panel [phone]) T- Hemoglobin A1C [phone])  Complete Medication List: 1)  Onetouch Ultra Test Strp (Glucose blood) .... For once daily testing 2)  Riomet 500 Mg/57ml Soln (Metformin hcl) .[name].. 10 ml twice daily 3)  Depo-provera 150 Mg/ml Susp (Medroxyprogesterone acetate) .... One shot im every 3 months 4)  Prednisone 10 Mg Tabs (Prednisone) .... Take as directed for 12 days 5)  Handicapt Accessible Shower Head  .... Use as directed  dx: ms 6)  Wheelchair Misc (Misc. devices) .... Dx ms 7)  Shower Chair  .... Dx: ms 8)  Rolling Walker  .... Use as directed dx: ms 9)  Novolog 100 Unit/ml Soln (Insulin aspart) .... Use as directed for sliding scale insulin 10)  Bd Insulin Syringe 27.5g X 5/8" 2 Ml Misc  (Insulin syringe-needle u-100) .... Use as directed 11)  Neurontin 300 Mg Caps (Gabapentin) .[name].. 1 cap  every morning and 2 capsules every evening 12)  Adderall Xr 20 Mg Xr24h-cap (Amphetamine-dextroamphetamine) .... Take 1 tablet by mouth every morning  Patient Instructions: 1)  Please schedule a follow-up appointment in 1 month.  Current Allergies (reviewed today): ! ACTOS (PIOGLITAZONE HCL)

## 2011-01-02 NOTE — Letter (Signed)
Summary: [facility]  [facility]   Imported By: Lester Hatley 10/25/2008 09:51:58  _____________________________________________________________________  External Attachment:    Type:   Image     Comment:   External Document

## 2011-01-02 NOTE — Assessment & Plan Note (Signed)
Summary: not feeling well   Vital Signs:  Patient Profile:   35 Years Old Female Height:     64 inches Weight:      266.50 pounds BMI:     45.91 Temp:     98.5 degrees F oral Pulse rate:   88 / minute Pulse rhythm:   regular Resp:     18 per minute BP sitting:   122 / 88  (right arm) Cuff size:   large  Vitals Entered By: Glendell Docker CMA (October 18, 2008 1:09 PM)                 PCP:  Dondra Spry DO  Chief Complaint:  Severe Fatigue.  History of Present Illness: c/o severe fatigue x 2 weeks.  She was seen approx 1 month ago with ear infection.   She says she has not been this tired since her last pregnancy.  She can not stay awake during the day.   She has fallen asleep at work.  She gets exhausted just taking a shower.    Current Allergies (reviewed today): ! ACTOS (PIOGLITAZONE HCL)  Past Medical History:    Asthma    Diabetes mellitus, type II    GERD    Hypertension    PCOS    Gastroparesis    Fatty Liver (NA[facility])     Past Surgical History:    Cholecystectomy    D&C      Family History:    Mother has diabetes, hypertension, and depresson    Father has history of liver disease.  He died of cirrhosis.    Family history of CAD    Family history of ovarian cancer    Daughter with bipolar depression    Social History:    Married    Never Smoked    Alcohol use-no            Physical Exam  General:     well-developed, well-nourished, and overweight-appearing.   Head:     normocephalic and atraumatic.   Ears:     R ear normal and L ear normal.   Mouth:     pharynx pink and moist and pharyngeal crowding.   Neck:     supple and no masses.   Lungs:     normal respiratory effort and normal breath sounds.   Heart:     normal rate, regular rhythm, and no gallop.   Abdomen:     mild RUQ tendernesssoft, no guarding, and no rigidity.   Extremities:     trace left pedal edema and trace right pedal edema.   Neurologic:     cranial nerves  II-XII intact and gait normal.   Psych:     normally interactive, good eye contact, not anxious appearing, and not depressed appearing.      Impression & Recommendations:  Problem # 1:  FATIGUE (ICD-780.79) Pt with severe fatigue.  She is unable to keep awake at work.  She has URI last month.  ? mono / viral illness.  If blood work normal, we discussed follow up with Dr. Shelle Iron for trial of CPAP.   (Previous sleep study only showed mild OSA 04/2008). Orders: TLB-ALT (SGPT) (84460-ALT) TLB-AST (SGOT) (84450-SGOT) TLB-BMP (Basic Metabolic Panel-BMET) (80048-METABOL) TLB-CBC Platelet - w/Differential (85025-CBCD) TLB-Hepatic/Liver Function Pnl (80076-HEPATIC) TLB-Mono Test (Monospot) (09811-BJYN) TLB-TSH (Thyroid Stimulating Hormone) (84443-TSH)   Complete Medication List: 1)  Bd U/f Mini Pen Needle 31g X 5 Mm Misc (Insulin pen needle) .... For  two times a day injection 2)  Onetouch Ultra Test Strp (Glucose blood) .... For once daily testing 3)  Diovan 80 Mg Tabs (Valsartan) .... One by mouth once daily 4)  Riomet 500 Mg/9ml Soln (Metformin hcl) .[name].. 10 ml twice daily 5)  Voltaren 1 % Gel (Diclofenac sodium) .... Apply qid 6)  Crestor 10 Mg Tabs (Rosuvastatin calcium) .... One by mouth once daily 7)  Fish Oil 1000 Mg Caps (Omega-3 fatty acids) .... Take 1 tablet by mouth two times a day   Patient Instructions: 1)  Please schedule a follow-up appointment in 2 weeks.   ] Current Allergies (reviewed today): ! ACTOS (PIOGLITAZONE HCL)

## 2011-01-02 NOTE — Letter (Signed)
Summary: Handicapped Placard/[facility]  Handicapped Placard/[facility]   Imported By: Lanelle Bal 06/29/2009 12:43:07  _____________________________________________________________________  External Attachment:    Type:   Image     Comment:   External Document

## 2011-01-02 NOTE — Letter (Signed)
     March 19, 2008   [facility] [address] [place], [place] [address]  RE:  LAB RESULTS  Dear  Ms. Rob Hickman,  The following is an interpretation of your most recent lab tests.  Please take note of any instructions provided or changes to medications that have resulted from your lab work.  ELECTROLYTES:  Good - no changes needed  KIDNEY FUNCTION TESTS:  Good - no changes needed  LIVER FUNCTION TESTS:  Fair - review at your next visit     ESR (sed rate) - slightly elevated.    I will discuss your labs further at our next office visit.   Sincerely Yours,    Dr. Thomos Lemons

## 2011-01-02 NOTE — Assessment & Plan Note (Signed)
Summary: feeling much worse/mhf   Vital Signs:  Patient profile:   35 year old female Height:      64 inches Weight:      257.75 pounds O2 Sat:      96 % on Room air Temp:     98.1 degrees F oral Pulse rate:   104 / minute Pulse rhythm:   regular Resp:     22 per minute BP sitting:   120 / 80  (right arm) Cuff size:   large  Vitals Entered By: Glendell Docker CMA (March 20, 2010 1:43 PM)  O2 Flow:  Room air  CC: Rm 2- Follow up    Primary Care Provider:  Dondra Spry DO  CC:  Rm 2- Follow up .  History of Present Illness: 35 y/o female for Er Follow up pt having persistent cough and congestion   vomiting is unresolved, has a rx for Zofran, but has not started cough is barking in nature  Allergies: 1)  ! Actos (Pioglitazone Hcl)  Past History:  Past Medical History: Asthma Diabetes mellitus, type II GERD Hypertension   PCOS  Gastroparesis Fatty Liver (NASH)    Polyneuropathy      Multple Sclerosis  (Dr Anne Hahn)  Past Surgical History: Cholecystectomy D&C        Lap Band surgery - 01/26/2008    Family History: Mother has diabetes, hypertension, and depresson Father has history of liver disease.  He died of cirrhosis. Family history of CAD Family history of ovarian cancer  Daughter with bipolar depression         Social History: Married Never Smoked    Alcohol use-no        works at [employer]   Physical Exam  General:  alert, well-developed, and well-nourished.   Lungs:  normal respiratory effort.  faint exp wheezing bilaterally Heart:  normal rate, regular rhythm, no murmur, and no gallop.   Extremities:  trace ankle edema bilat   Impression & Recommendations:  Problem # 1:  BRONCHITIS (ICD-490) Pt received course of z pak.  add ceftin.  question post viral cough vs pertussis.  use dulera as directed.  Patient advised to call office if symptoms persist or worsen.  The following medications were removed from the medication list:    Cheratussin  Ac 100-10 Mg/33ml Syrp (Guaifenesin-codeine) .[name]... 1-2 tsps q4-6 as needed for cough.  will cause drowsiness.  disp Her updated medication list for this problem includes:    Dulera 100-5 Mcg/act Aero (Mometasone furo-formoterol fum) .[name]... 2 puffs two times a day    Ventolin Hfa 108 (90 Base) Mcg/act Aers (Albuterol sulfate) .[name]... 2 puffs q4 as needed for wheezing    Hydromet 5-1.5 Mg/67ml Syrp (Hydrocodone-homatropine) .Marland KitchenMarland KitchenMarland KitchenMarland Kitchen 5 ml at bedtime as needed    Albuterol Sulfate (2.5 Mg/48ml) 0.083% Nebu (Albuterol sulfate) ..... Use qid as needed    Cefuroxime Axetil 500 Mg Tabs (Cefuroxime axetil) ..... One by mouth two times a day  Complete Medication List: 1)  Handicapt Accessible Shower Head  .... Use as directed  dx: ms 2)  Wheelchair Misc (Misc. devices) .... Dx ms 3)  Shower Chair  .... Dx: ms 4)  Rolling Walker  .... Use as directed dx: ms 5)  Neurontin 800 Mg Tabs (Gabapentin) .... Three times a day 6)  Adderall Xr 20 Mg Xr24h-cap (Amphetamine-dextroamphetamine) .... Take 1 tablet by mouth every morning as needed 7)  Rebif 22 Mcg/0.94ml Soln (Interferon beta-1a) .... 3x per week--mon, wed, sat  8)  Dulera 100-5 Mcg/act Aero (Mometasone furo-formoterol fum) .... 2 puffs two times a day 9)  Prednisone 20 Mg Tabs (Prednisone) .... Taper dose as directed 10)  Ventolin Hfa 108 (90 Base) Mcg/act Aers (Albuterol sulfate) .... 2 puffs q4 as needed for wheezing 11)  Zofran 4 Mg Tabs (Ondansetron hcl) .... Use as directed 12)  Hydromet 5-1.5 Mg/76ml Syrp (Hydrocodone-homatropine) .... 5 ml at bedtime as needed 13)  Albuterol Sulfate (2.5 Mg/36ml) 0.083% Nebu (Albuterol sulfate) .... Use qid as needed 14)  Cefuroxime Axetil 500 Mg Tabs (Cefuroxime axetil) .... One by mouth two times a day  Patient Instructions: 1)  Use neil med sinus over the counter 2)  Call our office if your symptoms do not  improve or gets worse. Prescriptions: CEFUROXIME AXETIL 500 MG TABS (CEFUROXIME AXETIL) one by mouth two  times a day  #20 x 0   Entered and Authorized by:   D. Thomos Lemons DO   Signed by:   D. Thomos Lemons DO on 03/20/2010   Method used:   Electronically to        [facility]. [phone]* (retail)       [address]       [place], [place]  [address]       Ph: [phone]       Fax: [phone]   RxID:   [id number] ALBUTEROL SULFATE (2.5 MG/3ML) 0.083% NEBU (ALBUTEROL SULFATE) use qid as needed  #1 month x 1   Entered and Authorized by:   D. Thomos Lemons DO   Signed by:   D. Thomos Lemons DO on 03/20/2010   Method used:   Electronically to        [facility]. [phone]* (retail)       [address]       [place], [place]  [address]       Ph: [phone]       Fax: [phone]   RxID:   [id number] HYDROMET 5-1.5 MG/5ML SYRP (HYDROCODONE-HOMATROPINE) 5 ml at bedtime as needed  #60 ml x 0   Entered and Authorized by:   D. Thomos Lemons DO   Signed by:   D. Thomos Lemons DO on 03/20/2010   Method used:   Print then Give to Patient   RxID:   [id number]

## 2011-01-02 NOTE — Letter (Signed)
   [place] [facility] [address] [place], [place]  [address]  [place] Phone: [phone]      September 05, 2010   [name] [address] [place], [place] [address]  RE:  LAB RESULTS  Dear  Ms. Plumb,  The following is an interpretation of your most recent lab tests.  Please take note of any instructions provided or changes to medications that have resulted from your lab work.  ELECTROLYTES:  Good - no changes needed  KIDNEY FUNCTION TESTS:  Good - no changes needed  LIVER FUNCTION TESTS:  Stable - no changes needed   THYROID STUDIES:  Thyroid studies normal TSH: 1.560     DIABETIC STUDIES:  Fair - schedule a follow-up appointment Blood Glucose: 152   HgbA1C: 6.7   Microalbumin/Creatinine Ratio: 9.5     CBC:  Good - no changes needed       Sincerely Yours,    Dr. Thomos Lemons  Appended Document:  mailed

## 2011-01-02 NOTE — Miscellaneous (Signed)
Summary: update meds  Clinical Lists Changes  Medications: Removed medication of GLUMETZA 1000 MG  TB24 (METFORMIN HCL) one by mouth qd Removed medication of OMEPRAZOLE 20 MG  TBEC (OMEPRAZOLE) 1 by mouth qd Added new medication of METFORMIN HCL 500 MG  TB24 (METFORMIN HCL) two times a day

## 2011-01-02 NOTE — Letter (Signed)
Summary: [facility]   Imported By: Lanelle Bal 09/19/2010 15:16:42  _____________________________________________________________________  External Attachment:    Type:   Image     Comment:   External Document

## 2011-01-02 NOTE — Assessment & Plan Note (Signed)
Summary: BS IS 160/ PAIN ON RIGHT ABD/MIGRAINE /NWS   Vital Signs:  Patient Profile:   35 Years Old Female Height:     64 inches Weight:      265 pounds Temp:     97.3 degrees F oral Pulse rate:   84 / minute BP sitting:   133 / 86  (right arm) Cuff size:   regular  Pt. in pain?   yes    Location:   abdomen    Intensity:   6    Type:       dull  Vitals Entered By: Maris Berger (February 23, 2008 3:53 PM)                  Chief Complaint:  abd pain.  History of Present Illness: here with abd pain for the past week more mid abd and right side, 6/10 in severity, constant, with weaness,m fatigue, lathargy, and decreased apetitie, no fever and not really feeling hot and cold; no other GI symptoms such as constipation; urine might be some cloudy  - no freq or pain or blood; blood sugars now more in the 160 range since she has been feeling ill    Updated Prior Medication List: GLUCOPHAGE XR 500 MG  TB24 (METFORMIN HCL) one by mouth bid BD U/F MINI PEN NEEDLE 31G X 5 MM  MISC (INSULIN PEN NEEDLE) for two times a day injection ONETOUCH ULTRA TEST   STRP (GLUCOSE BLOOD) for once daily testing DIOVAN 80 MG  TABS (VALSARTAN) one by mouth once daily CIPROFLOXACIN HCL 500 MG  TABS (CIPROFLOXACIN HCL) 1 by mouth bid OMEPRAZOLE 20 MG  TBEC (OMEPRAZOLE) 1 by mouth qd  Current Allergies (reviewed today): ! ACTOS (PIOGLITAZONE HCL)  Past Medical History:    Reviewed history from 06/27/2007 and no changes required:       Asthma       Diabetes mellitus, type II       GERD       Hypertension       PCOS       Gastroparesis  Past Surgical History:    Reviewed history from 06/27/2007 and no changes required:       Cholecystectomy       D&C   Family History:    Reviewed history from 01/19/2008 and no changes required:       Mother has diabetes, hypertension, and depresson       Father has history of liver disease.  He died of cirrhosis.       Family history of CAD  Family history of ovarian cancer  Social History:    Reviewed history from 01/19/2008 and no changes required:       Married       Never Smoked       Alcohol use-no       Daughter with bipolar depression     Physical Exam  General:     mild ill Head:     Normocephalic and atraumatic without obvious abnormalities. No apparent alopecia or balding. Eyes:     No corneal or conjunctival inflammation noted. EOMI. Perrla. Ears:     External ear exam shows no significant lesions or deformities.  Otoscopic examination reveals clear canals, tympanic membranes are intact bilaterally without bulging, retraction, inflammation or discharge. Hearing is grossly normal bilaterally. Nose:     External nasal examination shows no deformity or inflammation. Nasal mucosa are pink and moist without lesions or exudates. Mouth:  Oral mucosa and oropharynx without lesions or exudates.  Teeth in good repair. Neck:     No deformities, masses, or tenderness noted. Lungs:     Normal respiratory effort, chest expands symmetrically. Lungs are clear to auscultation, no crackles or wheezes. Heart:     Normal rate and regular rhythm. S1 and S2 normal without gallop, murmur, click, rub or other extra sounds. Abdomen:     diffuse mild tender and bloated somewhat it seems, ? worse suprapubic Extremities:     no edema, no ulcers     Impression & Recommendations:  Problem # 1:  ABDOMINAL PAIN, GENERALIZED (ICD-789.07) etiology unclear - recent labs reviewed, is s/p ccx, mostly bloating and pain I suspect may be related to her gastroparesis, or possible ileus related to UTI ?  - will check ua and culture, tx empiric with cipro course, also gave PPI with her epigastric problems epmric, check h pylori and lipase as well; consider CT if no answers or no better Orders: TLB-H. Pylori Abs(Helicobacter Pylori) (86677-HELICO) TLB-Lipase (83690-LIPASE) TLB-Amylase (82150-AMYL) T-Culture, Urine  (60454-09811) TLB-Udip ONLY (81003-UDIP)   Problem # 2:  DIABETES MELLITUS, TYPE II (ICD-250.00)  Her updated medication list for this problem includes:    Glucophage Xr 500 Mg Tb24 (Metformin hcl) ..... One by mouth bid    Diovan 80 Mg Tabs (Valsartan) ..... One by mouth once daily  Labs Reviewed: HgBA1c: 6.9 (12/31/2007)   Creat: 0.6 (12/31/2007)     stable overall by hx and exam, ok to continue meds/tx as is   Problem # 3:  OBESITY, MORBID (ICD-278.01) d/w pt differences and advantages/disadvantages between lap gastric bypass vs roux-n-y - 10 minutes  Complete Medication List: 1)  Glucophage Xr 500 Mg Tb24 (Metformin hcl) .... One by mouth bid 2)  Bd U/f Mini Pen Needle 31g X 5 Mm Misc (Insulin pen needle) .... For two times a day injection 3)  Onetouch Ultra Test Strp (Glucose blood) .... For once daily testing 4)  Diovan 80 Mg Tabs (Valsartan) .... One by mouth once daily 5)  Ciprofloxacin Hcl 500 Mg Tabs (Ciprofloxacin hcl) .[name].. 1 by mouth bid 6)  Omeprazole 20 Mg Tbec (Omeprazole) .[name].. 1 by mouth qd   Patient Instructions: 1)  you will have blood and urine tests today 2)  start the omeprazole20 mg for possible stomach acid problems 3)  take the ciprofloxocin for probable urinary tract infection 4)  consider CT scan of the stomach if no better 5)  continue all medications that you may have been taking previously 6)  Please schedule an appointment with your primary doctor as needed    Prescriptions: OMEPRAZOLE 20 MG  TBEC (OMEPRAZOLE) 1 by mouth qd  #30 x 11   Entered and Authorized by:   Corwin Levins MD   Signed by:   Corwin Levins MD on 02/23/2008   Method used:   Print then Give to Patient   RxID:   [id number] CIPROFLOXACIN HCL 500 MG  TABS (CIPROFLOXACIN HCL) 1 by mouth bid  #20 x 0   Entered and Authorized by:   Corwin Levins MD   Signed by:   Corwin Levins MD on 02/23/2008   Method used:   Print then Give to Patient   RxID:   [id number]  ]
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
## 2011-01-02 NOTE — Assessment & Plan Note (Signed)
Summary: SORE THROAT-EAR ACHE-HEADACHE-STC  Medications Added CEFTIN 500 MG  TABS (CEFUROXIME AXETIL) one by mouth bid GLUCOPHAGE XR 500 MG  TB24 (METFORMIN HCL) one by mouth bid        Vital Signs:  Patient Profile:   35 Years Old Female Height:     64 inches Weight:      266.25 pounds BMI:     45.87 Temp:     97.7 degrees F oral Pulse rate:   82 / minute BP sitting:   124 / 82  (right arm)  Vitals Entered By: Glendell Docker (December 31, 2007 10:57 AM)                 Chief Complaint:  C/O SORE THROAT AND SNUS PROBLEMS and URI symptoms.  History of Present Illness:  URI Symptoms      This is a 35 year old woman who presents with URI symptoms.  The patient reports nasal congestion, sore throat, and earache.  Associated symptoms include fever.  The patient denies headache, muscle aches, and severe fatigue.    Current Allergies: ! ACTOS (PIOGLITAZONE HCL)  Past Medical History:    Reviewed history from 06/27/2007 and no changes required:       Asthma       Diabetes mellitus, type II       GERD       Hypertension       PCOS       Gastroparesis   Social History:    Married    Never Smoked    Alcohol use-no   Risk Factors:  Tobacco use:  never Alcohol use:  no  PAP Smear History:     Date of Last PAP Smear:  08/18/2007    Results:  normal    Review of Systems       She stopped all meds due to financial reasons.   Physical Exam  General:     alert and overweight-appearing.   Eyes:     vision grossly intact, pupils equal, pupils round, and pupils reactive to light.   Ears:     R TM retraction and L TM retraction.   Nose:     mucosal erythema and mucosal edema.   Mouth:     pharyngeal erythema and postnasal drip.   Neck:     supple and no masses.   Lungs:     normal respiratory effort and normal breath sounds.   Heart:     normal rate, regular rhythm, and no murmur.      Impression & Recommendations:  Problem # 1:  URI  (ICD-465.9) Patient with URI symptoms for over 1 week.  She reports fever this am and sinus congestion. Ceftin 500 mg by mouth two times a day.  Problem # 2:  DIABETES MELLITUS, TYPE II (ICD-250.00) She has stopped all meds due to financial pressure.  Repeat labs.  Restart Metformin.  Follow up within 1-2 months.  The following medications were removed from the medication list:    Metformin Hcl 1000 Mg Tabs (Metformin hcl) ..... One by mouth two times a day    Diovan 160 Mg Tabs (Valsartan) ..... One by mouth once daily    Byetta 10 Mcg Pen 10 Mcg/0.30ml Soln (Exenatide) ..... One by mouth two times a day before meals  Her updated medication list for this problem includes:    Glucophage Xr 500 Mg Tb24 (Metformin hcl) ..... One by mouth bid  Orders: TLB-A1C / Hgb A1C (  Glycohemoglobin) (83036-A1C) TLB-Hepatic/Liver Function Pnl (80076-HEPATIC) TLB-Lipid Panel (80061-LIPID) TLB-Microalbumin/Creat Ratio, Urine (82043-MALB) TLB-CBC Platelet - w/Differential (85025-CBCD)   Complete Medication List: 1)  Ceftin 500 Mg Tabs (Cefuroxime axetil) .... One by mouth bid 2)  Glucophage Xr 500 Mg Tb24 (Metformin hcl) .... One by mouth bid  Other Orders: Rapid Strep (16109) TLB-BMP (Basic Metabolic Panel-BMET) (80048-METABOL) TLB-TSH (Thyroid Stimulating Hormone) (84443-TSH)   Patient Instructions: 1)  Please schedule a follow-up appointment in 2 months.    Prescriptions: GLUCOPHAGE XR 500 MG  TB24 (METFORMIN HCL) one by mouth bid  #60 x 5   Entered and Authorized by:   Dondra Spry DO   Signed by:   Dondra Spry DO on 12/31/2007   Method used:   Electronically sent to ...       [facility] [address]       [address]       [place], [place]  [address]       Ph: [phone]       Fax: [phone]   RxID:   [id number] CEFTIN 500 MG  TABS (CEFUROXIME AXETIL) one by mouth bid  #20 x 0   Entered and Authorized by:   Dondra Spry DO   Signed by:    Dondra Spry DO on 12/31/2007   Method used:   Electronically sent to ...       [facility] [address]       [address]       [place], [place]  [address]       Ph: [phone]       Fax: [phone]   RxID:   [id number]  ]  Preventive Care Screening  Pap Smear:    Date:  08/18/2007    Results:  normal    Laboratory Results    Other Tests  Rapid Strep: negative

## 2011-01-02 NOTE — Letter (Signed)
Summary: [facility]  [facility]   Imported By: Lanelle Bal 09/27/2008 11:49:40  _____________________________________________________________________  External Attachment:    Type:   Image     Comment:   External Document

## 2011-01-02 NOTE — Progress Notes (Signed)
Summary: Status Update  Phone Note Call from Patient Call back at Home Phone [phone]   Caller: Patient Reason for Call: Acute Illness Summary of Call: patient called and states she is still having some chest tightness, weakness, sob and fatigue. She states she is using her inhalerwith some improvement, but is still having the chest tightness after use. She states she can schedule appointment for 1/21, but she would like to know what Dr Artist Pais suggest Initial call taken by: Glendell Docker CMA,  December 22, 2008 9:01 AM  Follow-up for Phone Call        see if pt can come in now Follow-up by: D. Thomos Lemons DO,  December 22, 2008 10:39 AM  Additional Follow-up for Phone Call Additional follow up Details #1::        I called to see if pt. could come in now & nobody answered the phone. Thank you, Michaelle Copas Additional Follow-up by: Michaelle Copas,  December 22, 2008 11:08 AM

## 2011-01-02 NOTE — Progress Notes (Signed)
Summary: Course of Treatment  Phone Note Call from Patient Call back at Work Phone [phone]   Caller: Patient Summary of Call: voice message left stating she was seen by Neurology and she was advised to choose her course of treatment and did not feel comfortable doing so. She is requesting a return call to discuss. Initial call taken by: Glendell Docker CMA,  July 06, 2009 8:46 AM  Follow-up for Phone Call        call returned to patient at [phone], no answer no voice mail call placed to [phone], no answer voice message left informing patient I was returning her phone call. Follow-up by: Glendell Docker CMA,  July 06, 2009 3:03 PM  Additional Follow-up for Phone Call Additional follow up Details #1::        needs OV to discuss and to review information Additional Follow-up by: D. Thomos Lemons DO,  July 07, 2009 11:29 AM    Additional Follow-up for Phone Call Additional follow up Details #2::    attempted to contact patient [phone], no answer, detailed voice message left informing patient per Dr Artist Pais instructions Follow-up by: Glendell Docker CMA,  July 08, 2009 9:25 AM

## 2011-01-02 NOTE — Assessment & Plan Note (Signed)
Summary: Wheezing, breathing problems, Sweating- jr   Vital Signs:  Patient profile:   35 year old female Weight:      245.25 pounds BMI:     42.25 Temp:     98.9 degrees F oral Pulse rate:   90 / minute Pulse rhythm:   regular Resp:     20 per minute BP sitting:   112 / 80  (right arm) Cuff size:   large  Primary Care Provider:  Dondra Spry DO  CC:  Wheezing.  History of Present Illness:       The patient presents with asthma exacerbation.  The patient complains of history of diagnosed Asthma, chest tightness, wheezing, and nocturnal awakening.  Precipitating event is non-adherence to asthma plan and ran out of medications.  Compliance with asthma plan is fair.  Response to rescue meds has been good.    Allergies: 1)  ! Actos (Pioglitazone Hcl)  Past History:  Past Medical History:    Asthma    Diabetes mellitus, type II    GERD    Hypertension    PCOS    Gastroparesis    Fatty Liver (NASH)       Polyneuropathy       Past Surgical History:    Cholecystectomy    D&C           Lap Band surgery - 01/26/2008  Family History:    Mother has diabetes, hypertension, and depresson    Father has history of liver disease.  He died of cirrhosis.    Family history of CAD    Family history of ovarian cancer     Daughter with bipolar depression      Social History:    Married    Never Smoked      Alcohol use-no             Review of Systems      See HPI for Pulmonary, Cardiac, and General review of systems.  Physical Exam  General:  alert and overweight-appearing.   Eyes:  vision grossly intact, pupils equal, pupils round, and pupils reactive to light.   Ears:  R ear normal and L ear normal.   Mouth:  pharynx pink and moist and no exudates.   Neck:  supple and no masses.   Lungs:  normal respiratory effort.  scattered exp wheeze bil Heart:  normal rate, regular rhythm, and no gallop.   Abdomen:  soft and non-tender.   Extremities:  trace left pedal edema and  trace right pedal edema.   Neurologic:  cranial nerves II-XII intact and gait normal.   Psych:  normally interactive, good eye contact, not anxious appearing, and not depressed appearing.     Impression & Recommendations:  Problem # 1:  ASTHMA, WITH ACUTE EXACERBATION (ICD-493.92) 35 yo with hx of asthma ran out her symbicort.   She is taking productive cough and wheezing.   Take Azithromycin and prednisone as directed.   Patient advised to call office if symptoms persist or worsen.  Her updated medication list for this problem includes:    Proair Hfa 108 (90 Base) Mcg/act Aers (Albuterol sulfate) .[name]... 2 inh q4h as needed shortness of breath    Symbicort 160-4.5 Mcg/act Aero (Budesonide-formoterol fumarate) .[name]... 2 puffs two times a day    Albuterol Sulfate (2.5 Mg/59ml) 0.083% Nebu (Albuterol sulfate) ..... Use qid as needed    Prednisone 10 Mg Tabs (Prednisone) .Marland KitchenMarland KitchenMarland KitchenMarland Kitchen 4 tabs by mouth q.d. x 3 days,  3 tabs  by mouth q.d. x 3 days, 2 tabs by mouth q.d. x 3 days, 1 tab p.o. x 3 days  Pulmonary Functions Reviewed: O2 sat: 95 (01/04/2009)  Complete Medication List: 1)  Onetouch Ultra Test Strp (Glucose blood) .... For once daily testing 2)  Riomet 500 Mg/64ml Soln (Metformin hcl) .[name].. 10 ml twice daily 3)  Proair Hfa 108 (90 Base) Mcg/act Aers (Albuterol sulfate) .... 2 inh q4h as needed shortness of breath 4)  Nexium 40 Mg Cpdr (Esomeprazole magnesium) .... One by mouth bid 30 min before meal 5)  Symbicort 160-4.5 Mcg/act Aero (Budesonide-formoterol fumarate) .... 2 puffs two times a day 6)  Albuterol Sulfate (2.5 Mg/26ml) 0.083% Nebu (Albuterol sulfate) .... Use qid as needed 7)  Fluticasone Propionate 50 Mcg/act Susp (Fluticasone propionate) .... 2 sprays each nostril qd 8)  Depo-provera 150 Mg/ml Susp (Medroxyprogesterone acetate) .... One shot im every 3 months 9)  Norethindrone Acetate 5 Mg Tabs (Norethindrone acetate) .... 2-3 tablets by mouth once daily 10)  Claritin 10 Mg Tabs  (Loratadine) .... Take 1 tablet by mouth once a day  as needed 11)  Azithromycin 250 Mg Tabs (Azithromycin) .... 2 tabs by mouth once daily x 3 days 12)  Prednisone 10 Mg Tabs (Prednisone) .... 4 tabs by mouth q.d. x 3 days,  3 tabs  by mouth q.d. x 3 days, 2 tabs by mouth q.d. x 3 days, 1 tab p.o. x 3 days  Patient Instructions: 1)  Call our office if your symptoms do not  improve or gets worse. 2)  Please schedule a follow-up appointment in 3 months. Prescriptions: PROAIR HFA 108 (90 BASE) MCG/ACT  AERS (ALBUTEROL SULFATE) 2 inh q4h as needed shortness of breath  #1 x 5   Entered and Authorized by:   D. Thomos Lemons DO   Signed by:   D. Thomos Lemons DO on 03/23/2009   Method used:   Electronically to        [facility] [phone]* (retail)       [address]       [place], [place]  [address]       Ph: [phone]       Fax: [phone]   RxID:   [id number] RIOMET 500 MG/5ML SOLN (METFORMIN HCL) 10 ml twice daily  #1 month x 5   Entered and Authorized by:   D. Thomos Lemons DO   Signed by:   D. Thomos Lemons DO on 03/23/2009   Method used:   Electronically to        [facility]. [phone]* (retail)       [address]       [place], [place]  [address]       Ph: [phone]       Fax: [phone]   RxID:   [id number] ONETOUCH ULTRA TEST   STRP (GLUCOSE BLOOD) for once daily testing  #100 x 5   Entered and Authorized by:   D. Thomos Lemons DO   Signed by:   D. Thomos Lemons DO on 03/23/2009   Method used:   Electronically to        [facility]. [phone]* (retail)       [address]       [place], [place]  [address]  Ph: [phone]       Fax: [phone]   RxID:   [id number] SYMBICORT 160-4.5 MCG/ACT AERO (BUDESONIDE-FORMOTEROL FUMARATE) 2 puffs two times a day  #1 x 5   Entered and Authorized by:   D. Thomos Lemons DO   Signed by:   D. Thomos Lemons DO on 03/23/2009   Method used:    Electronically to        [facility]. [phone]* (retail)       [address]       [place], [place]  [address]       Ph: [phone]       Fax: [phone]   RxID:   [id number] PREDNISONE 10 MG TABS (PREDNISONE) 4 tabs by mouth q.d. x 3 days,  3 tabs  by mouth q.d. x 3 days, 2 tabs by mouth q.d. x 3 days, 1 tab p.o. x 3 days  #30 x 0   Entered and Authorized by:   D. Thomos Lemons DO   Signed by:   D. Thomos Lemons DO on 03/23/2009   Method used:   Electronically to        [facility]. [phone]* (retail)       [address]       [place], [place]  [address]       Ph: [phone]       Fax: [phone]   RxID:   [id number] AZITHROMYCIN 250 MG TABS (AZITHROMYCIN) 2 tabs by mouth once daily x 3 days  #6 x 0   Entered and Authorized by:   D. Thomos Lemons DO   Signed by:   D. Thomos Lemons DO on 03/23/2009   Method used:   Electronically to        [facility]. [phone]* (retail)       [address]       [place], [place]  [address]       Ph: [phone]       Fax: [phone]   RxID:   [id number]   Current Allergies (reviewed today): ! ACTOS (PIOGLITAZONE HCL)

## 2011-01-02 NOTE — Consult Note (Signed)
Summary: [facility]   Imported By: Maryln Gottron 01/21/2008 15:45:48  _____________________________________________________________________  External Attachment:    Type:   Image     Comment:   External Document

## 2011-01-02 NOTE — Assessment & Plan Note (Signed)
Summary: coughing,congestion, chest pressure- jr   Vital Signs:  Patient profile:   35 year old female Height:      64 inches Weight:      254.75 pounds BMI:     43.89 O2 Sat:      97 % on Room air Temp:     98.0 degrees F oral Pulse rate:   106 / minute BP sitting:   128 / 88  (right arm)  Vitals Entered By: Lucious Groves (January 12, 2010 2:39 PM)  O2 Flow:  Room air CC: C/O coughing and congestion x 3 days. Chest pain and trouble breathing started last night. Pt is not producing any mucous, but has fever, and N&V./kb, URI symptoms Is Patient Diabetic? Yes Did you bring your meter with you today? No Pain Assessment Patient in pain? yes     Location: chest Intensity: 5 Type: heaviness Onset of pain  01-11-2010 Comments Motrin has not really made a difference./kb   Primary Care Provider:  Dondra Spry DO  CC:  C/O coughing and congestion x 3 days. Chest pain and trouble breathing started last night. Pt is not producing any mucous, but has fever, and N&V./kb, and URI symptoms.  History of Present Illness:  URI Symptoms      This is a 35 year old woman who presents with URI symptoms.  The patient reports productive cough.  Associated symptoms include low-grade fever (<100.5 degrees).  mild sob that is worse at night  Current Medications (verified): 1)  Onetouch Ultra Test   Strp (Glucose Blood) .... For Once Daily Testing 2)  Riomet 500 Mg/56ml Soln (Metformin Hcl) .[name].. 10 Ml Twice Daily 3)  Handicapt Accessible Shower Head .... Use As Directed  Dx: Ms 4)  Wheelchair  Misc (Misc. Devices) .... Dx Ms 5)  Shower Chair .... Dx: Ms 6)  Rolling Walker .... Use As Directed Dx: Ms 7)  Bd Insulin Syringe 27.5g X 5/8" 2 Ml Misc (Insulin Syringe-Needle U-100) .... Use As Directed 8)  Neurontin 600 Mg Tabs (Gabapentin) .[name].. 1 By Mouth Tid 9)  Adderall Xr 20 Mg Xr24h-Cap (Amphetamine-Dextroamphetamine) .... Take 1 Tablet By Mouth Every Morning 10)  Rebif 22 Mcg/0.74ml Soln (Interferon  Beta-1a) .... 3x Per Merril Abbe, Wed, Sat  Allergies (verified): 1)  ! Actos (Pioglitazone Hcl)  Past History:  Past Medical History: Asthma Diabetes mellitus, type II GERD  Hypertension  PCOS  Gastroparesis Fatty Liver (NASH)    Polyneuropathy      Multple Sclerosis  (Dr Anne Hahn)  Past Surgical History: Cholecystectomy D&C        Lap Band surgery - 01/26/2008    Family History: Mother has diabetes, hypertension, and depresson Father has history of liver disease.  He died of cirrhosis. Family history of CAD Family history of ovarian cancer  Daughter with bipolar depression        Social History: Married Never Smoked   Alcohol use-no        works at [employer]    Physical Exam  General:  alert and overweight-appearing.   Ears:  R ear normal and L ear normal.   Mouth:  pharyngeal erythema.   Neck:  supple, no masses, and no neck tenderness.   Lungs:  normal respiratory effort.  faint coarse breath sounds bilaterally Heart:  normal rate, regular rhythm, and no gallop.     Impression & Recommendations:  Problem # 1:  BRONCHITIS (ICD-490) 35 y/o female with hx of asthma with early bronchitis.  start  doxy.  samples of dulera provided.   Patient advised to call office if symptoms persist or worsen.  Her updated medication list for this problem includes:    Doxycycline 100 mg by mouth two times a day     Dulera 100-5 Mcg/act Aero (Mometasone furo-formoterol fum) .[name]... 2 puffs two times a day  Complete Medication List: 1)  Onetouch Ultra Test Strp (Glucose blood) .... For once daily testing 2)  Riomet 500 Mg/75ml Soln (Metformin hcl) .[name].. 10 ml twice daily 3)  Handicapt Accessible Shower Head  .... Use as directed  dx: ms 4)  Wheelchair Misc (Misc. devices) .... Dx ms 5)  Shower Chair  .... Dx: ms 6)  Rolling Walker  .... Use as directed dx: ms 7)  Bd Insulin Syringe 27.5g X 5/8" 2 Ml Misc (Insulin syringe-needle u-100) .... Use as directed 8)  Neurontin 600 Mg Tabs  (Gabapentin) .[name].. 1 by mouth tid 9)  Adderall Xr 20 Mg Xr24h-cap (Amphetamine-dextroamphetamine) .... Take 1 tablet by mouth every morning 10)  Rebif 22 Mcg/0.62ml Soln (Interferon beta-1a) .... 3x per week--mon, wed, sat 11)  Avelox 400 Mg Tabs (Moxifloxacin hcl) .... One by mouth once daily 12)  Dulera 100-5 Mcg/act Aero (Mometasone furo-formoterol fum) .... 2 puffs two times a day  Patient Instructions: 1)  Call our office if your symptoms do not  improve or gets worse. 2)  Please schedule a follow-up appointment in 1 month. 3)  BMP prior to visit, ICD-9: 250.00 4)  Hepatic Panel prior to visit, ICD-9: 250.00 5)  Lipid Panel prior to visit, ICD-9: 250.00 6)  TSH prior to visit, ICD-9: 250.00 7)  HbgA1C prior to visit, ICD-9: 250.00 8)  Please return for lab work one (1) week before your next appointment.  Prescriptions: DOXYCYCLINE HYCLATE 100 MG TABS (DOXYCYCLINE HYCLATE) one by mouth two times a day  #20 x 0   Entered and Authorized by:   D. Thomos Lemons DO   Signed by:   D. Thomos Lemons DO on 01/12/2010   Method used:   Electronically to        [facility]. [phone]* (retail)       [address]       [place], [place]  [address]       Ph: [phone]       Fax: [phone]   RxID:   [id number]

## 2011-01-02 NOTE — Assessment & Plan Note (Signed)
Summary: FU-$50-PER PT DISCUSS BLOODWORK-STC   Vital Signs:  Patient Profile:   35 Years Old Female Height:     64 inches Weight:      266.38 pounds BMI:     45.89 Temp:     97.8 degrees F oral Pulse rate:   76 / minute BP sitting:   120 / 88  (left arm)  Vitals Entered By: Glendell Docker (March 29, 2008 10:40 AM)                 Chief Complaint:  F/U BLOOD SUGAR & LAB and Type 2 diabetes mellitus follow-up.  History of Present Illness:  Type 2 Diabetes Mellitus Follow-Up      This is a 35 year old woman who presents for Type 2 diabetes mellitus follow-up.  The patient denies polyuria, polydipsia, weight loss, and weight gain.  The patient denies the following symptoms: chest pain.  Since the last visit the patient reports good dietary compliance and  monitoring blood glucose.  The patient has been measuring capillary blood glucose before breakfast.  AM fasting CBG is 160's to 180's.      Current Allergies (reviewed today): ! ACTOS (PIOGLITAZONE HCL)  Past Medical History:    Asthma    Diabetes mellitus, type II    GERD    Hypertension    PCOS    Gastroparesis    Fatty Liver (NASH)   Family History:    Mother has diabetes, hypertension, and depresson    Father has history of liver disease.  He died of cirrhosis.    Family history of CAD    Family history of ovarian cancer    Daughter with bipolar depression  Social History:    Married    Never Smoked    Alcohol use-no    Review of Systems      See HPI   Physical Exam  General:     alert and overweight-appearing.   Mouth:     Oral mucosa and oropharynx without lesions or exudates.   Neck:     supple and no masses.   Lungs:     normal respiratory effort and normal breath sounds.   Heart:     normal rate, regular rhythm, no murmur, and no gallop.   Abdomen:     soft and non-tender.   Extremities:     trace left pedal edema and trace right pedal edema.   Neurologic:     alert & oriented X3 and  cranial nerves II-XII intact.   Skin:     turgor normal and color normal.   Psych:     normally interactive and good eye contact.      Impression & Recommendations:  Problem # 1:  DIABETES MELLITUS, TYPE II (ICD-250.00) Pt reports nausea and loose stools with metformin.  Trial of glumetza.  Pt's AM CBG 160-180.  Increase lantus to 10 units two times a day.  Her updated medication list for this problem includes:    Glumetza 1000 Mg Tb24 (Metformin hcl) ..... One by mouth qd    Diovan 80 Mg Tabs (Valsartan) ..... One by mouth once daily    Lantus Solostar 100 Unit/ml Soln (Insulin glargine) .Marland KitchenMarland KitchenMarland KitchenMarland Kitchen 10 units two times a day - titrate as discussed  Labs Reviewed: HgBA1c: 6.9 (12/31/2007)   Creat: 0.5 (03/17/2008)      Problem # 2:  FATTY LIVER DISEASE (ICD-571.8) Transaminases improved.  Continue metformin.  Question referral to GI specialist at [facility]. (Dr. Tobi Bastos  [name] )  Problem # 3:  SNORING (ICD-786.09) Daugher notes loud snoring.  Arrange sleep study.  OSA may be exacerbating diabetes control.  Problem # 4:  HYPERTENSION (ICD-401.9) BP improved.  Maintain current medication regimen.  Her updated medication list for this problem includes:    Diovan 80 Mg Tabs (Valsartan) ..... One by mouth once daily  BP today: 120/88 Prior BP: 136/98 (03/02/2008)  Labs Reviewed: Creat: 0.5 (03/17/2008) Chol: 265 (12/31/2007)   HDL: 36.7 (12/31/2007)   LDL: DEL (12/31/2007)   TG: 132 (12/31/2007)   Complete Medication List: 1)  Glumetza 1000 Mg Tb24 (Metformin hcl) .... One by mouth qd 2)  Bd U/f Mini Pen Needle 31g X 5 Mm Misc (Insulin pen needle) .... For two times a day injection 3)  Onetouch Ultra Test Strp (Glucose blood) .... For once daily testing 4)  Diovan 80 Mg Tabs (Valsartan) .... One by mouth once daily 5)  Omeprazole 20 Mg Tbec (Omeprazole) .[name].. 1 by mouth qd 6)  Lantus Solostar 100 Unit/ml Soln (Insulin glargine) .[name].. 10 units two times a day - titrate as  discussed  Other Orders: Sleep Disorder Referral (Sleep Disorder)   Patient Instructions: 1)  Please schedule a follow-up appointment in 3 months. 2)  BMP prior to visit, ICD-9:  401.9 3)  Hepatic Panel prior to visit, ICD-9:  272.4 4)  HbgA1C prior to visit, ICD-9:  250.00 5)  Urine Microalbumin prior to visit, ICD-9: 250.00 6)  Please return for lab work one (1) week before your next appointment.   (Fasting)    Prescriptions: GLUMETZA 1000 MG  TB24 (METFORMIN HCL) one by mouth qd  #30 x 5   Entered and Authorized by:   D. Thomos Lemons DO   Signed by:   D. Thomos Lemons DO on 03/29/2008   Method used:   Electronically sent to ...       [facility] [address] [phone]*       [phone]       [place], [place]  [address]       Ph: [phone]       Fax: [phone]   RxID:   [id number] LANTUS SOLOSTAR 100 UNIT/ML  SOLN (INSULIN GLARGINE) 10 units two times a day - titrate as discussed  #1 box x 5   Entered and Authorized by:   D. Thomos Lemons DO   Signed by:   D. Thomos Lemons DO on 03/29/2008   Method used:   Electronically sent to ...       [facility] [address] [phone]*       [address]       [place], [place]  [address]       Ph: [phone]       Fax: [phone]   RxID:   [id number]  ] Current Allergies (reviewed today): ! ACTOS (PIOGLITAZONE HCL)

## 2011-01-02 NOTE — Progress Notes (Signed)
Summary: Results   Phone Note Call from Patient Call back at Work Phone [phone]   Caller: Patient Call For: Dr Artist Pais Summary of Call: Patient called [facility] requesting lab and U/S results. Please advise   OK to lm on office vm per patient Initial call taken by: Rock Nephew CMA,  January 28, 2008 1:36 PM  Follow-up for Phone Call        blood tests neg for chronic hepatitis.  Abd u/s shows fatty liver.  She needs f/u with GI for NASH. Follow-up by: D. Thomos Lemons DO,  January 28, 2008 9:50 PM  Additional Follow-up for Phone Call Additional follow up Details #1::        Lf mess for pt Additional Follow-up by: Lamar Sprinkles,  January 29, 2008 1:18 PM

## 2011-01-02 NOTE — Consult Note (Signed)
Summary: [facility]   Imported By: Lanelle Bal 02/07/2009 12:14:00  _____________________________________________________________________  External Attachment:    Type:   Image     Comment:   External Document

## 2011-01-02 NOTE — Assessment & Plan Note (Signed)
Summary: SHORTNESS OF BREATH COUGHING UP PHLEGM/MHF   Vital Signs:  Patient profile:   35 year old female Weight:      255 pounds O2 Sat:      95 % on Room air Temp:     98.5 degrees F oral Pulse rate:   106 / minute BP sitting:   110 / 80  (left arm)  Vitals Entered By: Doristine Devoid (March 09, 2010 1:56 PM)  O2 Flow:  Room air CC: wheezing, cough, and SOB x4 days    History of Present Illness: 35 yo woman here today for wheezing, cough, and SOB.  sxs started a few weeks ago w/ 'allergy type symptoms', SOB x2 days.  ran out of inhaler 4 days ago.  hx of asthma.  tried claritin and zyrtec w/out relief.  cough is now productive.  fever to 102 at night.  pt reports asthma only occurs when sick.  doesn't have albuterol inhaler.  no known sick contacts.  Current Medications (verified): 1)  Handicapt Accessible Shower Head .... Use As Directed  Dx: Ms 2)  Wheelchair  Misc (Misc. Devices) .... Dx Ms 3)  Shower Chair .... Dx: Ms 4)  Rolling Walker .... Use As Directed Dx: Ms 5)  Neurontin 800 Mg Tabs (Gabapentin) .... Three Times A Day 6)  Adderall Xr 20 Mg Xr24h-Cap (Amphetamine-Dextroamphetamine) .... Take 1 Tablet By Mouth Every Morning As Needed 7)  Rebif 22 Mcg/0.71ml Soln (Interferon Beta-1a) .... 3x Per Merril Abbe, Wed, Sat 8)  Dulera 100-5 Mcg/act Aero (Mometasone Furo-Formoterol Fum) .... 2 Puffs Two Times A Day 9)  Prednisone 20 Mg Tabs (Prednisone) .... 2 Tabs Daily X10 Days.  Take W/ Food. 10)  Ventolin Hfa 108 (90 Base) Mcg/act Aers (Albuterol Sulfate) .... 2 Puffs Q4 As Needed For Wheezing 11)  Azithromycin 250 Mg  Tabs (Azithromycin) .... 2 By  Mouth Today and Then 1 Daily For 4 Days 12)  Cheratussin Ac 100-10 Mg/7ml Syrp (Guaifenesin-Codeine) .[name].. 1-2 Tsps Q4-6 As Needed For Cough.  Will Cause Drowsiness.  Disp  Allergies (verified): 1)  ! Actos (Pioglitazone Hcl)  Past History:  Past Medical History: Last updated: 01/19/2010 Asthma Diabetes mellitus, type  II GERD Hypertension  PCOS  Gastroparesis Fatty Liver (NASH)    Polyneuropathy      Multple Sclerosis  (Dr Anne Hahn)  Review of Systems      See HPI  Physical Exam  General:  alert, well-developed, and well-nourished.   Head:  normocephalic and atraumatic.   Eyes:  no injxn or inflammation Ears:  TMs WNL Nose:  mild congestion Mouth:  no tonsilar erythema, edema, or exudate Lungs:  diffuse wheezing CTAB s/p neb Heart:  normal rate, regular rhythm, and no gallop.     Impression & Recommendations:  Problem # 1:  BRONCHITIS (ICD-490) Assessment Unchanged pt w/ bronchitis which is likely exacerbating her asthma.  start Azithro.  codeine cough syrup for night cough.  sample and script given for dulera.  sample and script for albuterol.  start steroids for 10 day burst, no taper needed.  reviewed supportive care and red flags that should prompt return.  Pt expresses understanding and is in agreement w/ this plan.  The following medications were removed from the medication list:    Avelox 400 Mg Tabs (Moxifloxacin hcl) ..... One by mouth once daily Her updated medication list for this problem includes:    Dulera 100-5 Mcg/act Aero (Mometasone furo-formoterol fum) .[name]... 2 puffs two times a day  Ventolin Hfa 108 (90 Base) Mcg/act Aers (Albuterol sulfate) .[name]... 2 puffs q4 as needed for wheezing    Azithromycin 250 Mg Tabs (Azithromycin) .[name]... 2 by  mouth today and then 1 daily for 4 days    Cheratussin Ac 100-10 Mg/8ml Syrp (Guaifenesin-codeine) .[name]... 1-2 tsps q4-6 as needed for cough.  will cause drowsiness.  disp  Problem # 2:  ASTHMA (ICD-493.90) Assessment: Deteriorated see above Her updated medication list for this problem includes:    Dulera 100-5 Mcg/act Aero (Mometasone furo-formoterol fum) .[name]... 2 puffs two times a day    Prednisone 20 Mg Tabs (Prednisone) .[name]... 2 tabs daily x10 days.  take w/ food.    Ventolin Hfa 108 (90 Base) Mcg/act Aers (Albuterol sulfate) .[name]...  2 puffs q4 as needed for wheezing  Complete Medication List: 1)  Handicapt Accessible Shower Head  .... Use as directed  dx: ms 2)  Wheelchair Misc (Misc. devices) .... Dx ms 3)  Shower Chair  .... Dx: ms 4)  Rolling Walker  .... Use as directed dx: ms 5)  Neurontin 800 Mg Tabs (Gabapentin) .... Three times a day 6)  Adderall Xr 20 Mg Xr24h-cap (Amphetamine-dextroamphetamine) .... Take 1 tablet by mouth every morning as needed 7)  Rebif 22 Mcg/0.64ml Soln (Interferon beta-1a) .... 3x per week--mon, wed, sat 8)  Dulera 100-5 Mcg/act Aero (Mometasone furo-formoterol fum) .... 2 puffs two times a day 9)  Prednisone 20 Mg Tabs (Prednisone) .... 2 tabs daily x10 days.  take w/ food. 10)  Ventolin Hfa 108 (90 Base) Mcg/act Aers (Albuterol sulfate) .... 2 puffs q4 as needed for wheezing 11)  Azithromycin 250 Mg Tabs (Azithromycin) .... 2 by  mouth today and then 1 daily for 4 days 12)  Cheratussin Ac 100-10 Mg/31ml Syrp (Guaifenesin-codeine) .[name].. 1-2 tsps q4-6 as needed for cough.  will cause drowsiness.  disp  Other Orders: Nebulizer Tx (98119)  Patient Instructions: 1)  Follow up if wheezing continues or worsens 2)  Start the Azithromycin to treat a bronchitis 3)  Use the Dulera daily as directed to prevent symptoms 4)  Use the albuterol as needed for wheezing 5)  Take the Prednisone as directed- no need for a taper 6)  Use the codeine cough syrup to help you sleep 7)  Hang in there! Prescriptions: CHERATUSSIN AC 100-10 MG/5ML SYRP (GUAIFENESIN-CODEINE) 1-2 tsps Q4-6 as needed for cough.  will cause drowsiness.  disp  #150 x 0   Entered and Authorized by:   Neena Rhymes MD   Signed by:   Neena Rhymes MD on 03/09/2010   Method used:   Print then Give to Patient   RxID:   [id number] AZITHROMYCIN 250 MG  TABS (AZITHROMYCIN) 2 by  mouth today and then 1 daily for 4 days  #6 x 0   Entered and Authorized by:   Neena Rhymes MD   Signed by:   Neena Rhymes MD on  03/09/2010   Method used:   Electronically to        [facility] [phone]* (retail)       [address]       [place], [place]  [address]       Ph: [phone]       Fax: [phone]   RxID:   [id number] VENTOLIN HFA 108 (90 BASE) MCG/ACT AERS (ALBUTEROL SULFATE) 2 puffs Q4 as needed for wheezing  #1 x 3   Entered  and Authorized by:   Neena Rhymes MD   Signed by:   Neena Rhymes MD on 03/09/2010   Method used:   Electronically to        [facility]. [phone]* (retail)       [address]       [place], [place]  [address]       Ph: [phone]       Fax: [phone]   RxID:   [id number] PREDNISONE 20 MG TABS (PREDNISONE) 2 tabs daily x10 days.  take w/ food.  #20 x 0   Entered and Authorized by:   Neena Rhymes MD   Signed by:   Neena Rhymes MD on 03/09/2010   Method used:   Electronically to        [facility]. [phone]* (retail)       [address]       [place], [place]  [address]       Ph: [phone]       Fax: [phone]   RxID:   [id number] DULERA 100-5 MCG/ACT AERO (MOMETASONE FURO-FORMOTEROL FUM) 2 puffs two times a day  #1 x 1   Entered and Authorized by:   Neena Rhymes MD   Signed by:   Neena Rhymes MD on 03/09/2010   Method used:   Electronically to        [facility]. [phone]* (retail)       [address]       [place], [place]  [address]       Ph: [phone]       Fax: [phone]   RxID:   [id number]

## 2011-01-02 NOTE — Letter (Signed)
Summary: Paperwork for [facility]  Paperwork for [facility]   Imported By: Lanelle Bal 09/14/2009 12:18:18  _____________________________________________________________________  External Attachment:    Type:   Image     Comment:   External Document

## 2011-01-02 NOTE — Letter (Signed)
Summary: Out of Work  Adult nurse at [employer]. [address]   [place], [place] [address]   Phone: [phone]  Fax: [phone]      January 04, 2009      Employee:  [name]       To Whom It May Concern:   For Medical reasons, please excuse the above named employee from work for the following dates:  Start:   01/04/2009  End:   01/06/2009  May resume normal work schedule on 01/06/2009.If you need additional information, please feel free to contact our office.           Sincerely,    Glendell Docker CMA Dr Thomos Lemons

## 2011-01-02 NOTE — Assessment & Plan Note (Signed)
Summary: F/U MEDS/JK   Vital Signs:  Patient Profile:   35 Years Old Female Height:     64 inches Weight:      266 pounds BMI:     45.82 Temp:     98.2 degrees F oral Pulse rate:   88 / minute Pulse rhythm:   regular Resp:     18 per minute BP sitting:   118 / 84  (right arm) Cuff size:   large  Vitals Entered By: Glendell Docker CMA (September 02, 2008 1:09 PM)                 PCP:  Dondra Spry DO  Chief Complaint:  Follow up Er visit and Type 2 diabetes mellitus follow-up.  History of Present Illness:  Type 2 Diabetes Mellitus Follow-Up      This is a 35 year old woman who presents for Type 2 diabetes mellitus follow-up.  The patient denies weight loss, and weight gain.  She stopped lantus due to low blood sugars.  The patient denies the following symptoms: neuropathic pain and chest pain.  Since the last visit the patient reports good dietary compliance and monitoring blood glucose.  The patient has been measuring capillary blood glucose before breakfast.  Since the last visit, the patient reports having had eye care by an ophthalmologist.    Pt reports left elbow injury.  She bumped her elbow and has persistent pain (laternal aspect).  No redness or swelling.  Pt still persuing wt loss surgery.   She requests medications be changed to liquid if possible.    Current Allergies (reviewed today): ! ACTOS (PIOGLITAZONE HCL)  Past Medical History:    Asthma    Diabetes mellitus, type II    GERD    Hypertension    PCOS    Gastroparesis    Fatty Liver (NASH)   Past Surgical History:    Cholecystectomy    D&C    Family History:    Mother has diabetes, hypertension, and depresson    Father has history of liver disease.  He died of cirrhosis.    Family history of CAD    Family history of ovarian cancer    Daughter with bipolar depression   Social History:    Married    Never Smoked    Alcohol use-no     Review of Systems      See HPI   Physical  Exam  General:     alert, well-developed, and well-nourished.   Head:     normocephalic and atraumatic.   Neck:     supple and no masses.   Lungs:     normal respiratory effort and normal breath sounds.   Heart:     normal rate, regular rhythm, no murmur, and no gallop.   Abdomen:     soft and non-tender.   Extremities:     trace left pedal edema and trace right pedal edema.   Neurologic:     alert & oriented X3 and cranial nerves II-XII intact.    Diabetes Management Exam:    Foot Exam (with socks and/or shoes not present):       Sensory-Monofilament:          Left foot: normal          Right foot: normal       Inspection:          Left foot: normal  Right foot: normal       Nails:          Left foot: normal          Right foot: normal    Eye Exam:       Eye Exam done elsewhere          Date: 01/07/2008          Results: normal          Done by: Dr. Lorin Picket ([facility])    Impression & Recommendations:  Problem # 1:  DIABETES MELLITUS, TYPE II (ICD-250.00) Lantus DCed due to low blood sugar.   Change metformin to liquid as per Dr. Allene Pyo request.  ( There is concern pills can get stuck after surgery)  The following medications were removed from the medication list:    Lantus Solostar 100 Unit/ml Soln (Insulin glargine) .Marland KitchenMarland KitchenMarland KitchenMarland Kitchen 10 units two times a day - titrate as discussed  Her updated medication list for this problem includes:    Diovan 80 Mg Tabs (Valsartan) ..... One by mouth once daily    Riomet 500 Mg/23ml Soln (Metformin hcl) .Marland KitchenMarland KitchenMarland KitchenMarland Kitchen 10 ml twice daily  Orders: TLB-BMP (Basic Metabolic Panel-BMET) (80048-METABOL) TLB-TSH (Thyroid Stimulating Hormone) (84443-TSH) TLB-A1C / Hgb A1C (Glycohemoglobin) (83036-A1C) TLB-Microalbumin/Creat Ratio, Urine (82043-MALB)  Labs Reviewed: HgBA1c: 6.9 (12/31/2007)   Creat: 0.5 (03/17/2008)   Microalbumin: 1.4 (12/31/2007)   Problem # 2:  HYPERLIPIDEMIA (ICD-272.4) We discussed risk of statin therapy in  NASH pts.  Limited studies but statin may actually held NASH.  Monitor LFT's before next visit.  Her updated medication list for this problem includes:    Crestor 10 Mg Tabs (Rosuvastatin calcium) ..... One by mouth once daily  Labs Reviewed: Chol: 265 (12/31/2007)   HDL: 36.7 (12/31/2007)   LDL: DEL (12/31/2007)   TG: 132 (12/31/2007) SGOT: 56 (03/17/2008)   SGPT: 95 (03/17/2008)   Problem # 3:  HYPERTENSION (ICD-401.9) BP well controlled.  Maintain current medication regimen.  Her updated medication list for this problem includes:    Diovan 80 Mg Tabs (Valsartan) ..... One by mouth once daily  Orders: TLB-BMP (Basic Metabolic Panel-BMET) (80048-METABOL) TLB-TSH (Thyroid Stimulating Hormone) (84443-TSH) TLB-A1C / Hgb A1C (Glycohemoglobin) (83036-A1C) TLB-Microalbumin/Creat Ratio, Urine (82043-MALB)  BP today: 118/84 Prior BP: 120/88 (03/29/2008)  Labs Reviewed: Creat: 0.5 (03/17/2008) Chol: 265 (12/31/2007)   HDL: 36.7 (12/31/2007)   LDL: DEL (12/31/2007)   TG: 132 (12/31/2007)   Problem # 4:  EPICONDYLITIS (ICD-726.32) Left epicondylitis after mild trauma.  Ice pk two times a day x [redacted] wk along w diclofenac gel.  Ortho referral if no improvement.  Complete Medication List: 1)  Bd U/f Mini Pen Needle 31g X 5 Mm Misc (Insulin pen needle) .... For two times a day injection 2)  Onetouch Ultra Test Strp (Glucose blood) .... For once daily testing 3)  Diovan 80 Mg Tabs (Valsartan) .... One by mouth once daily 4)  Riomet 500 Mg/55ml Soln (Metformin hcl) .[name].. 10 ml twice daily 5)  Voltaren 1 % Gel (Diclofenac sodium) .... Apply qid 6)  Crestor 10 Mg Tabs (Rosuvastatin calcium) .... One by mouth once daily   Patient Instructions: 1)  Take fish oil two times a day. 2)  Please schedule a follow-up appointment in 2 months. 3)  AST, ALT prior to visit, ICD-9: 272.4 4)  Lipid Panel prior to visit, ICD-9: 272.4   Prescriptions: CRESTOR 10 MG TABS (ROSUVASTATIN CALCIUM) one by mouth  once daily  #30 x 3  Entered and Authorized by:   D. Thomos Lemons DO   Signed by:   D. Thomos Lemons DO on 09/02/2008   Method used:   Electronically to        [facility]. [phone]* (retail)       [address]       [place], [place]  [address]       Ph: [phone]       Fax: [phone]   RxID:   [id number] VOLTAREN 1 % GEL (DICLOFENAC SODIUM) apply qid  #3 tubes x 0   Entered and Authorized by:   D. Thomos Lemons DO   Signed by:   D. Thomos Lemons DO on 09/02/2008   Method used:   Electronically to        [facility]. [phone]* (retail)       [address]       [place], [place]  [address]       Ph: [phone]       Fax: [phone]   RxID:   [id number] RIOMET 500 MG/5ML SOLN (METFORMIN HCL) 10 ml twice daily  #1 month x 3   Entered and Authorized by:   D. Thomos Lemons DO   Signed by:   D. Thomos Lemons DO on 09/02/2008   Method used:   Electronically to        [facility]. [phone]* (retail)       [address]       [place], [place]  [address]       Ph: [phone]       Fax: [phone]   RxID:   [id number] METFORMIN HCL 750 MG XR24H-TAB (METFORMIN HCL) one by mouth bid  #60 x 3   Entered and Authorized by:   D. Thomos Lemons DO   Signed by:   D. Thomos Lemons DO on 09/02/2008   Method used:   Electronically to        [facility]. [phone]* (retail)       [address]       [place], [place]  [address]       Ph: [phone]       Fax: [phone]   RxID:   [id number]  ] Current Allergies (reviewed today): ! ACTOS (PIOGLITAZONE HCL)    Appended Document: Orders Update    Clinical Lists Changes  Orders: Added new Service order of Pneumococcal Vaccine (79892) - Signed Added new Service order of Influenza Vaccine NON MCR (11941) - Signed Added new Service order of Admin 1st Vaccine (74081) -  Signed Observations: Added new observation of FLU VAX#1VIS: 06/26/07 version given September 02, 2008. (09/02/2008 14:57) Added new observation of FLU VAXLOT: [id number] (09/02/2008 14:57) Added new observation of FLU VAX EXP: 06/01/2009 (09/02/2008 14:57) Added new observation of FLU VAXBY: Darlene Knight CMA (09/02/2008 14:57) Added new observation of FLU VAXRTE: IM (09/02/2008 14:57) Added new observation of FLU VAX DSE: 0.5 ml (09/02/2008 14:57) Added new observation of FLU VAXMFR: GlaxoSmithKline (09/02/2008 14:57) Added new observation of FLU VAX SITE: right deltoid (09/02/2008 14:57) Added new observation of FLU VAX: Fluvax Non-MCR (09/02/2008 14:57) Added new observation of PNEUMOVAXVIS: 06/30/96 version given September 02, 2008. (09/02/2008 14:57) Added new observation of PNEUMOVAXLOT: 0472Y (09/02/2008 14:57) Added new  observation of PNEUMOVAXEXP: 06/24/2009 (09/02/2008 14:57) Added new observation of PNEUMOVAXBY: Glendell Docker CMA (09/02/2008 14:57) Added new observation of PNEUMOVAXRTE: IM (09/02/2008 14:57) Added new observation of PNEUMOVAXMFR: Merck (09/02/2008 14:57) Added new observation of PNEUMOVAXSIT: right deltoid (09/02/2008 14:57) Added new observation of PNEUMOVAX: Pneumovax (09/02/2008 14:57)       Pneumovax Vaccine    Vaccine Type: Pneumovax    Site: right deltoid    Mfr: Merck    Dose: 0.5 ml    Route: IM    Given by: Glendell Docker CMA    Exp. Date: 06/24/2009    Lot #: [id number]    VIS given: 06/30/96 version given September 02, 2008.  Influenza Vaccine    Vaccine Type: Fluvax Non-MCR    Site: right deltoid    Mfr: GlaxoSmithKline    Dose: 0.5 ml    Route: IM    Given by: Glendell Docker CMA    Exp. Date: 06/01/2009    Lot #: [id number]    VIS given: 06/26/07 version given September 02, 2008.  Flu Vaccine Consent Questions    Do you have a history of severe allergic reactions to this vaccine? no    Any prior history of allergic reactions to egg and/or  gelatin? no    Do you have a sensitivity to the preservative Thimersol? no    Do you have a past history of Guillan-Barre Syndrome? no    Do you currently have an acute febrile illness? no    Have you ever had a severe reaction to latex? no    Vaccine information given and explained to patient? yes    Are you currently pregnant? no

## 2011-01-02 NOTE — Assessment & Plan Note (Signed)
Summary: PER PT RECEIVED LETTER-NEED A OV WITHIN THE [facility]  Medications Added BD U/F MINI PEN NEEDLE 31G X 5 MM  MISC (INSULIN PEN NEEDLE) for two times a day injection ONETOUCH ULTRA TEST   STRP (GLUCOSE BLOOD) for once daily testing DIOVAN 80 MG  TABS (VALSARTAN) one by mouth once daily        Vital Signs:  Patient Profile:   35 Years Old Female Height:     64 inches Temp:     97.3 degrees F oral Pulse rate:   76 / minute BP sitting:   134 / 90  (right arm)  Vitals Entered By: Glendell Docker (January 19, 2008 8:29 AM)                 Chief Complaint:  FOLLOW UP TO DISCUSS LABS.  History of Present Illness: 35 year old here for follow up.   She recently restarted metformin after not taking any of her meds for several months.  We reviewed labs.  She has abnormal LFT's.  I reviewed her previous records.  Her LFT's were slightly elevated in 04/07 ;  AST 38, ALT 67.  Most recent LFT's shows AST 117 and ALT 167.  She is asymptomatic.  She reports family hx of liver disease.  She does not drink.     Current Allergies (reviewed today): ! ACTOS (PIOGLITAZONE HCL)  Past Medical History:    Reviewed history from 06/27/2007 and no changes required:       Asthma       Diabetes mellitus, type II       GERD       Hypertension       PCOS       Gastroparesis   Family History:    Mother has diabetes, hypertension, and depresson    Father has history of liver disease.  He died of cirrhosis.    Family history of CAD    Family history of ovarian cancer  Social History:    Married    Never Smoked    Alcohol use-no    Daughter with bipolar depression    Review of Systems  The patient denies chest pain, dyspnea on exhertion, abdominal pain, and severe indigestion/heartburn.     Physical Exam  General:     alert and overweight-appearing.   Eyes:     no scleral icterus  Neck:     supple, no masses, and no carotid bruits.   Lungs:     normal respiratory effort and  normal breath sounds.   Heart:     normal rate, regular rhythm, and no murmur.   Abdomen:     soft, non-tender, no hepatomegaly, and no splenomegaly.   Extremities:     No lower extremity edema  Psych:     normally interactive and good eye contact.      Impression & Recommendations:  Problem # 1:  LIVER FUNCTION TESTS, ABNORMAL (ICD-794.8) Initiate laboratory work up.  Screen for hemachromatosis considering diabetes history.  RUQ to rule out fatty liver.  She may need GI consultation. Orders: TLB-Iron, (Fe) Total (83540-FE) TLB-Ferritin (82728-FER) T-Hepatitis B Core Antibody ([address]-60454) T-Hepatitis B Surface Antibody (09811-91478) T-Hepatitis C Antibody (29562-13086) T-Serum Protein Electrophoresis (57846-96295) T- * Misc. Laboratory test [id number]) T-Ceruloplasmin [id number]) TLB-IBC Pnl(Iron/FE;Transferrin) (83550-IBC) T-Antinuclear Antib (ANA) [phone]) Radiology Referral (Radiology)   Problem # 2:  DIABETES MELLITUS, TYPE II (ICD-250.00) Pt tolerating glucophage.  Add back Byetta  (5 micrograms sample pen provided).  She did  very well on Byetta 10 micrograms in the past.   Her updated medication list for this problem includes:    Glucophage Xr 500 Mg Tb24 (Metformin hcl) ..... One by mouth bid    Diovan 80 Mg Tabs (Valsartan) ..... One by mouth once daily  Labs Reviewed: HgBA1c: 6.9 (12/31/2007)   Creat: 0.6 (12/31/2007)      Problem # 3:  HYPERTENSION (ICD-401.9) Restart low dose ARB.  She was previously on benazepril but was bothered by dry hacking cough.  Her updated medication list for this problem includes:    Diovan 80 Mg Tabs (Valsartan) ..... One by mouth once daily  BP today: 134/90 Prior BP: 124/82 (12/31/2007)  Labs Reviewed: Creat: 0.6 (12/31/2007) Chol: 265 (12/31/2007)   HDL: 36.7 (12/31/2007)   LDL: DEL (12/31/2007)   TG: 132 (12/31/2007)   Problem # 4:  HYPERLIPIDEMIA (ICD-272.4) Hold on starting statin until liver disease work up  completed.   I advised dietary and lifestyle changes.  Consider using Welchol. Labs Reviewed: Chol: 265 (12/31/2007)   HDL: 36.7 (12/31/2007)   LDL: DEL (12/31/2007)   TG: 132 (12/31/2007) SGOT: 117 (12/31/2007)   SGPT: 167 (12/31/2007)   Complete Medication List: 1)  Glucophage Xr 500 Mg Tb24 (Metformin hcl) .... One by mouth bid 2)  Bd U/f Mini Pen Needle 31g X 5 Mm Misc (Insulin pen needle) .... For two times a day injection 3)  Onetouch Ultra Test Strp (Glucose blood) .... For once daily testing 4)  Diovan 80 Mg Tabs (Valsartan) .... One by mouth once daily   Patient Instructions: 1)  Please schedule a follow-up appointment in 6 weeks.    Prescriptions: BD U/F MINI PEN NEEDLE 31G X 5 MM  MISC (INSULIN PEN NEEDLE) for two times a day injection  #100 x 3   Entered and Authorized by:   D. Thomos Lemons DO   Signed by:   D. Thomos Lemons DO on 01/19/2008   Method used:   Electronically sent to ...       [facility] [address] [phone]*       [address]       [place], [place]  [address]       Ph: [phone]       Fax: [phone]   RxID:   [id number] ONETOUCH ULTRA TEST   STRP (GLUCOSE BLOOD) for once daily testing  #100 x 3   Entered and Authorized by:   D. Thomos Lemons DO   Signed by:   D. Thomos Lemons DO on 01/19/2008   Method used:   Electronically sent to ...       [facility] [address] [phone]*       [address]       [place], [place]  [address]       Ph: [phone]       Fax: [phone]   RxID:   [id number]  ] Current Allergies (reviewed today): ! ACTOS (PIOGLITAZONE HCL)

## 2011-01-02 NOTE — Assessment & Plan Note (Signed)
Summary: Asthma Exacerbation   Vital Signs:  Patient Profile:   35 Years Old Female Height:     64 inches Weight:      266.50 pounds BMI:     45.91 Temp:     97.6 degrees F oral Pulse rate:   100 / minute Pulse rhythm:   regular Resp:     24 per minute  Vitals Entered By: Glendell Docker CMA (December 17, 2008 2:35 PM)                 PCP:  Dondra Spry DO  Chief Complaint:  Cold & URI symptoms.  History of Present Illness:  URI Symptoms      This is a 35 year old woman who presents with URI symptoms.  The patient reports nasal congestion, purulent nasal discharge, sore throat, productive cough, and earache.  Associated symptoms include fever of 100.5-103 degrees, dyspnea, wheezing, diarrhea, and use of an antipyretic.  The patient also reports itchy watery eyes, sneezing, headache, and muscle aches.    She also reports significant wheezing and chest tightness.  Htn - stable. DM II - blood sugars have been slightly higher    Current Allergies (reviewed today): ! ACTOS (PIOGLITAZONE HCL)  Past Medical History:    Asthma    Diabetes mellitus, type II    GERD    Hypertension    PCOS    Gastroparesis    Fatty Liver (NASH)       Polyneuropathy   Past Surgical History:    Cholecystectomy    D&C       Family History:    Mother has diabetes, hypertension, and depresson    Father has history of liver disease.  He died of cirrhosis.    Family history of CAD    Family history of ovarian cancer    Daughter with bipolar depression     Social History:    Married    Never Smoked     Alcohol use-no            Review of Systems  The patient denies abdominal pain and severe indigestion/heartburn.     Physical Exam  General:     alert, well-developed, and well-nourished.   Head:     normocephalic and atraumatic.   Ears:     R TM mild erythema.  L ear normal and R TM erythema.   Mouth:     pharyngeal erythema and pharyngeal crowding.   Neck:     supple  and no masses.  tenderness near mastoid Lungs:     normal respiratory effort, R wheezes, and L wheezes.   Heart:     regular rhythm, no gallop, and tachycardia.   Extremities:     trace left pedal edema and trace right pedal edema.   Skin:     warm, slightly clammy Psych:     normally interactive, good eye contact, not anxious appearing, and not depressed appearing.      Impression & Recommendations:  Problem # 1:  ASTHMATIC BRONCHITIS, ACUTE (ICD-466.0) 35 y/o with hx of previous well controlled asthma reports 3 days of severe cough, wheezing and dyspnea.  O2 sat 94%.  Patient advised to call office if symptoms persist or worsen.  Her updated medication list for this problem includes:    Avelox 400 Mg Tabs (Moxifloxacin hcl) ..... One by mouth qd    Proair Hfa 108 (90 Base) Mcg/act Aers (Albuterol sulfate) .[name]... 2 inh q4h as needed shortness  of breath  Orders: Admin of Therapeutic Inj  intramuscular or subcutaneous (81191) Solumedrol up to 125mg  (Y7829) Nebulizer Tx (56213)   Problem # 2:  DIABETES MELLITUS, TYPE II (ICD-250.00) Pt advised to monitor CBG closely.  Pt given sample of lantus solostar pen.  She has used insulin before.  If CBG >300, pt to start 10 units of lantus.  Patient advised to call office if CBG persistently >300.  Her updated medication list for this problem includes:    Diovan 80 Mg Tabs (Valsartan) ..... One by mouth once daily    Riomet 500 Mg/61ml Soln (Metformin hcl) .Marland KitchenMarland KitchenMarland KitchenMarland Kitchen 10 ml twice daily    Lantus Solostar 100 Unit/ml Soln (Insulin glargine) .Marland KitchenMarland KitchenMarland KitchenMarland Kitchen 10 units once daily if blood sugar >300   Problem # 3:  HYPERTENSION (ICD-401.9) Stable.   Maintain current medication regimen.  Her updated medication list for this problem includes:    Diovan 80 Mg Tabs (Valsartan) ..... One by mouth once daily  Prior BP: 126/94 (11/24/2008)  Labs Reviewed: Creat: 0.6 (10/18/2008) Chol: 265 (12/31/2007)   HDL: 36.7 (12/31/2007)   LDL: 203.2 (12/31/2007)   TG:  132 (12/31/2007)   Complete Medication List: 1)  Bd U/f Mini Pen Needle 31g X 5 Mm Misc (Insulin pen needle) .... For two times a day injection 2)  Onetouch Ultra Test Strp (Glucose blood) .... For once daily testing 3)  Diovan 80 Mg Tabs (Valsartan) .... One by mouth once daily 4)  Riomet 500 Mg/63ml Soln (Metformin hcl) .[name].. 10 ml twice daily 5)  Voltaren 1 % Gel (Diclofenac sodium) .... Apply qid 6)  Gabapentin 100 Mg Caps (Gabapentin) .... One to two caps by mouth at bedtime as needed 7)  Avelox 400 Mg Tabs (Moxifloxacin hcl) .... One by mouth qd 8)  Prednisone 20 Mg Tabs (Prednisone) .... 2 tabs by mouth once daily x 3 days,  1 tab by mouth once daily x 3 days, 1/2 tab by mouth once daily x 4 days 9)  Proair Hfa 108 (90 Base) Mcg/act Aers (Albuterol sulfate) .... 2 inh q4h as needed shortness of breath 10)  Lantus Solostar 100 Unit/ml Soln (Insulin glargine) .[name].. 10 units once daily if blood sugar >300   Patient Instructions: 1)  Call our office if your symptoms do not  improve or gets worse.   Prescriptions: PROAIR HFA 108 (90 BASE) MCG/ACT  AERS (ALBUTEROL SULFATE) 2 inh q4h as needed shortness of breath  #1 x 1   Entered and Authorized by:   D. Thomos Lemons DO   Signed by:   D. Thomos Lemons DO on 12/17/2008   Method used:   Electronically to        [facility] [phone]* (retail)       [address]       [place], [place]  [address]       Ph: [phone]       Fax: [phone]   RxID:   [id number] PREDNISONE 20 MG TABS (PREDNISONE) 2 tabs by mouth once daily x 3 days,  1 tab by mouth once daily x 3 days, 1/2 tab by mouth once daily x 4 days  #15 x 0   Entered and Authorized by:   D. Thomos Lemons DO   Signed by:   D. Thomos Lemons DO on 12/17/2008   Method used:   Electronically to        [facility] [phone]* (retail)  [address]       [place], [place]  [address]       Ph: [phone]       Fax:  [phone]   RxID:   [id number] AVELOX 400 MG TABS (MOXIFLOXACIN HCL) one by mouth qd  #10 x 0   Entered and Authorized by:   D. Thomos Lemons DO   Signed by:   D. Thomos Lemons DO on 12/17/2008   Method used:   Electronically to        [facility]. [phone]* (retail)       [address]       [place], [place]  [address]       Ph: [phone]       Fax: [phone]   RxID:   [id number]   Current Allergies (reviewed today): ! ACTOS (PIOGLITAZONE HCL)   Medication Administration  Injection # 1:    Medication: Solumedrol up to 125mg     Diagnosis: ASTHMATIC BRONCHITIS, ACUTE (ICD-466.0)    Route: IM    Site: RUOQ gluteus    Exp Date: 03/03/2011    Lot #: [id number]    Mfr: [employer]    Patient tolerated injection without complications    Given by: Glendell Docker CMA (December 17, 2008 3:14 PM)  Orders Added: 1)  Admin of Therapeutic Inj  intramuscular or subcutaneous [96372] 2)  Solumedrol up to 125mg  [J2930] 3)  Nebulizer Tx [60109] 4)  Est. Patient Level IV [32355]

## 2011-01-02 NOTE — Letter (Signed)
Summary: Generic Letter  [name] at [facility]  [address]   [place], [place] [address]   Phone: [phone]  Fax: [phone]       10/21/2008      [name] [address] [place], [place]  [address]      To whom it may concern:  [name] is a patient in our practice. She has been seen recently for a medical condition that may affect her ability to work full time over the nex 1-2 weeks. We will be able to better assess her prognosis after further testing.           Sincerely,   Dr. Myrtis Ser at [facility]

## 2011-01-02 NOTE — Progress Notes (Signed)
  Phone Note From Other Clinic   Caller: Receptionist Summary of Call: [facility] called and stated that the information faxed to them on this patient did not have insurance information on it. They also need pt.'s height and weight. They state that pt. will have to come by [facility] to pick up Shower Head. Please call Alcario Drought [phone] Initial call taken by: Michaelle Copas,  June 16, 2009 11:40 AM  Follow-up for Phone Call        Other info faxed. LMOM for erica asking her to call Pt about shower head bc I am not sure where [facility] is located. Erica to call back if any other questions.  Follow-up by: Payton Spark CMA,  June 16, 2009 11:48 AM

## 2011-01-02 NOTE — Assessment & Plan Note (Signed)
Summary: fatigue,SOB,   Vital Signs:  Patient Profile:   36 Years Old Female Height:     64 inches Weight:      118 pounds BMI:     20.33 O2 Sat:      96 % O2 treatment:    Room Air Temp:     98.1 degrees F oral Pulse rate:   88 / minute Pulse rhythm:   regular Resp:     20 per minute BP sitting:   118 / 88  (right arm) Cuff size:   large  Vitals Entered By: Glendell Docker CMA (December 23, 2008 10:54 AM)                 PCP:  Dondra Spry DO  Chief Complaint:  Unresolved fatigue and chest tightness.  History of Present Illness: 35 y/o female for asthma follow up.   Pt's wheezing and shortness of breath improved.   She still has slight chest tightness with cough.   Her blood sugars not too high. (160-170's).   She started using lantus.  Pt complains of urinary freq.   She is having some difficutly holding her urine.   No fever or dysuria.      Current Allergies (reviewed today): ! ACTOS (PIOGLITAZONE HCL)  Past Medical History:    Asthma    Diabetes mellitus, type II    GERD    Hypertension    PCOS    Gastroparesis    Fatty Liver (NASH)       Polyneuropathy    Past Surgical History:    Cholecystectomy    D&C        Social History:    Married    Never Smoked     Alcohol use-no              Physical Exam  General:     alert, well-developed, and well-nourished.   Lungs:     normal respiratory effort, normal breath sounds, and no wheezes.   Heart:     normal rate, regular rhythm, and no gallop.   Abdomen:     soft and non-tender.   Extremities:     trace left pedal edema and trace right pedal edema.      Impression & Recommendations:  Problem # 1:  ASTHMATIC BRONCHITIS, ACUTE (ICD-466.0) Assessment: Improved Improved.   Significant decrease in wheezing.  Pt still having some chest tightness with cough. Finish abx and prednisone.    Her updated medication list for this problem includes:    Avelox 400 Mg Tabs (Moxifloxacin hcl) ..... One  by mouth qd    Proair Hfa 108 (90 Base) Mcg/act Aers (Albuterol sulfate) .[name]... 2 inh q4h as needed shortness of breath   Problem # 2:  URINARY FREQUENCY (ICD-788.41) UA negative for UTI.  Pt with microscopic hematuria.    Repeat u/a in 2-4 weeks.    Problem # 3:  DIABETES MELLITUS, TYPE II (ICD-250.00) Pt reports CBG higher -160-170's.   She is using lantus.  CBG should improve as prednisone is tapered.  Her updated medication list for this problem includes:    Diovan 80 Mg Tabs (Valsartan) ..... One by mouth once daily    Riomet 500 Mg/55ml Soln (Metformin hcl) .Marland KitchenMarland KitchenMarland KitchenMarland Kitchen 10 ml twice daily    Lantus Solostar 100 Unit/ml Soln (Insulin glargine) .Marland KitchenMarland KitchenMarland KitchenMarland Kitchen 10 units once daily if blood sugar >300   Complete Medication List: 1)  Bd U/f Mini Pen Needle 31g X 5 Mm Misc (Insulin  pen needle) .... For two times a day injection 2)  Onetouch Ultra Test Strp (Glucose blood) .... For once daily testing 3)  Diovan 80 Mg Tabs (Valsartan) .... One by mouth once daily 4)  Riomet 500 Mg/47ml Soln (Metformin hcl) .[name].. 10 ml twice daily 5)  Voltaren 1 % Gel (Diclofenac sodium) .... Apply qid 6)  Gabapentin 100 Mg Caps (Gabapentin) .... One to two caps by mouth at bedtime as needed 7)  Avelox 400 Mg Tabs (Moxifloxacin hcl) .... One by mouth qd 8)  Prednisone 20 Mg Tabs (Prednisone) .... 2 tabs by mouth once daily x 3 days,  1 tab by mouth once daily x 3 days, 1/2 tab by mouth once daily x 4 days 9)  Proair Hfa 108 (90 Base) Mcg/act Aers (Albuterol sulfate) .... 2 inh q4h as needed shortness of breath 10)  Lantus Solostar 100 Unit/ml Soln (Insulin glargine) .[name].. 10 units once daily if blood sugar >300 11)  Nexium 40 Mg Cpdr (Esomeprazole magnesium) .... One by mouth once daily 30 min before meal   Patient Instructions: 1)  Please schedule a follow-up appointment in 2 weeks.   Prescriptions: NEXIUM 40 MG CPDR (ESOMEPRAZOLE MAGNESIUM) one by mouth once daily 30 min before meal  #30 x 3   Entered and Authorized by:    D. Thomos Lemons DO   Signed by:   D. Thomos Lemons DO on 12/23/2008   Method used:   Electronically to        [facility] [phone]* (retail)       [address]       [place], [place]  [address]       Ph: [phone]       Fax: [phone]   RxID:   [id number]   Current Allergies (reviewed today): ! ACTOS (PIOGLITAZONE HCL)  Laboratory Results   Urine Tests  Date/Time Received: December 23, 2008 11:58 AM  Date/Time Reported: December 23, 2008 11:58 AM   Routine Urinalysis   Color: red Appearance: Clear Glucose: negative   (Normal Range: Negative) Bilirubin: negative   (Normal Range: Negative) Ketone: negative   (Normal Range: Negative) Spec. Gravity: >=1.030   (Normal Range: 1.003-1.035) Blood: large   (Normal Range: Negative) pH: 6.0   (Normal Range: 5.0-8.0) Protein: 30   (Normal Range: Negative) Urobilinogen: 0.2   (Normal Range: 0-1) Nitrite: negative   (Normal Range: Negative) Leukocyte Esterace: negative   (Normal Range: Negative)    Comments: Darra Lis RMA  December 23, 2008 11:59 AM

## 2011-01-02 NOTE — Miscellaneous (Signed)
Summary: Eye Exam  Clinical Lists Changes  Observations: Added new observation of EYES COMMENT: 01/2010 (01/20/2009 10:20) Added new observation of DIAB EYE EX: normal (01/19/2009 10:21)     Current Allergies: ! ACTOS (PIOGLITAZONE HCL)    Diabetes Management Exam:    Eye Exam:       Eye Exam done elsewhere          Date: 01/19/2009          Results: normal          Done by: [facility] Dr Cletis [place] B. [place]-[phone]

## 2011-01-02 NOTE — Letter (Signed)
Summary: [facility] Consult Scheduled Letter  All     ,     Phone:   Fax:       01/28/2008 MRN: [id number]  [facility] [address] [place], [place]  [address]    Dear Ms. Rob Hickman,      We have scheduled an appointment for you.  At the recommendation of Dr.Robert Artist Pais, we have scheduled you a consult with Dr Marina Goodell on 02/09/08 at 2:00pm.  Their phone number is [phone].  If this appointment day and time is not convenient for you, please feel free to call the office of the doctor you are being referred to at the number listed above and reschedule the appointment.     [facility] [address] [place] [address] *Gastroenterology Dept.3rd Floor*   If you need to reschedule/cancel appointment please give 24hr notice to avoid a $50.00 fee.     Thank you,  Patient Care Coordinator [facility]

## 2011-01-02 NOTE — Letter (Signed)
Summary: Generic Letter  [facility]  [address] [place], [place] [address]   Phone: [phone]  Fax: [phone]    04/13/2008   ARNEISHA KINCANNON [address] [place], [place]  [address]    To Whom It May Concern:  Please accept this letter as a formal request for authorization for lap band surgery for my patient, Jariana Shumard, MRN# [id number]. She has been a patient of in our practice since 03/18/06, has been morbidly obese for 15 years and has continually struggled to lose weight.  Her medical history indicates that she has tried many diets consecutively during the past 2 (2) years under medical supervision, including diabetic education counseling for 6 months, and low fat low calorie diets on and off since the age of 41. Although these programs have initially shown promise, all ended in failure with little or no weight loss.  Ms. Rob Hickman has been diagnosed with morbid obesity as well as Diabetes Mellitus Type II, Non alcoholic steatohepatitis, hypertension and asthma. Her co-morbid conditions are being treated with metformin, antihypertensives and Lantus insulin. Her weight is 266.4 pounds, height 64 inches with a BMI of 46.  I find this surgery to be a medical necessity and I have referred Ms. Bekoe to [facility], who specializes in bariatric surgery and offers extensive pre and post-op education and follow-up care. Should you have any questions regarding this recommendation, please feel free to contact me directly.    Sincerely,     D. Artist Pais DO [facility]

## 2011-01-02 NOTE — Assessment & Plan Note (Signed)
Summary: MEDICATION F/U/HEA   Vital Signs:  Patient Profile:   35 Years Old Female Height:     64 inches Weight:      252.75 pounds BMI:     43.54 Temp:     98.5 degrees F oral Pulse rate:   70 / minute Pulse rhythm:   regular Resp:     18 per minute BP sitting:   100 / 70  (left arm) Cuff size:   large  Vitals Entered By: Glendell Docker CMA (February 07, 2009 11:12 AM)             Is Patient Diabetic? Yes     PCP:  Dondra Spry DO  Chief Complaint:  Medication Follow up.  History of Present Illness: medication follow up        This is a 35 years old female who presents with allergic rhinitis.  The patient complains of runny nose, nasal congestion, sneezing, and post nasal drip, but denies chest tightness and wheezing.  Patient states symptoms are worse with exposure to change in weather and spring.  Symptoms improve with antihistamines and steroid nasal spray.    Hypertension-blood pressure stable without Diovan since lap band surgery. Patient is currently on liquid diet.  Type 2 diabetes-blood sugars significantly improved since left and surgery. Lantus discontinued.  Asthma - no wheezing.    Current Allergies (reviewed today): ! ACTOS (PIOGLITAZONE HCL)  Past Medical History:    Asthma    Diabetes mellitus, type II    GERD    Hypertension    PCOS    Gastroparesis    Fatty Liver (NASH)       Polyneuropathy      Past Surgical History:    Cholecystectomy    D&C          Family History:    Mother has diabetes, hypertension, and depresson    Father has history of liver disease.  He died of cirrhosis.    Family history of CAD    Family history of ovarian cancer     Daughter with bipolar depression     Social History:    Married    Never Smoked     Alcohol use-no               Review of Systems       See HPI.  All other systems negative.    Physical Exam  General:     alert, well-developed, and well-nourished.   Head:     normocephalic  and atraumatic.   Neck:     supple and no masses.  tenderness near mastoid Lungs:     normal respiratory effort, no accessory muscle use, and no wheezes.   Heart:     normal rate, regular rhythm, and no gallop.   Abdomen:     soft and non-tender.   Extremities:     trace left pedal edema and trace right pedal edema.    Diabetes Management Exam:    Foot Exam (with socks and/or shoes not present):       Sensory-Monofilament:          Left foot: normal          Right foot: normal       Inspection:          Left foot: normal          Right foot: normal    Impression & Recommendations:  Problem # 1:  HYPERTENSION (ICD-401.9)  Blood pressure improved since patient underwent lap band surgery. She is currently on liquid diet. Discontinue Diovan. Patient advised to monitor blood pressure at home. The following medications were removed from the medication list:    Diovan 80 Mg Tabs (Valsartan) .[name]... Take 1 tablet by mouth once a day as directed  BP today: 100/70 Prior BP: 110/80 (01/04/2009)  Labs Reviewed: Creat: 0.6 (10/18/2008) Chol: 265 (12/31/2007)   HDL: 36.7 (12/31/2007)   LDL: DEL (12/31/2007)   TG: 132 (12/31/2007)   Problem # 2:  ASTHMATIC BRONCHITIS, ACUTE (ICD-466.0) Assessment: Improved Asthmatic bronchitis flare resolved. Continue maintenance inhalers.  Her updated medication list for this problem includes:    Proair Hfa 108 (90 Base) Mcg/act Aers (Albuterol sulfate) .[name]... 2 inh q4h as needed shortness of breath    Symbicort 160-4.5 Mcg/act Aero (Budesonide-formoterol fumarate) .[name]... 2 puffs two times a day    Albuterol Sulfate (2.5 Mg/63ml) 0.083% Nebu (Albuterol sulfate) ..... Use qid as needed   Problem # 3:  DIABETES MELLITUS, TYPE II (ICD-250.00) Patient reports significantly improved  blood sugars since LAP-BAND surgery. Discontinue Lantus. Continue Riomet for now. The following medications were removed from the medication list:    Diovan 80 Mg Tabs  (Valsartan) .[name]... Take 1 tablet by mouth once a day as directed    Lantus Solostar 100 Unit/ml Soln (Insulin glargine) .Marland KitchenMarland KitchenMarland KitchenMarland Kitchen 15-25 units once daily if blood sugar >300  Her updated medication list for this problem includes:    Riomet 500 Mg/30ml Soln (Metformin hcl) .Marland KitchenMarland KitchenMarland KitchenMarland Kitchen 10 ml twice daily  Labs Reviewed: Creat: 0.6 (10/18/2008)     Last Eye Exam: normal (01/19/2009) HgBA1c: 7.2 (11/24/2008)  7.3 (09/02/2008)  Problem # 4:  ALLERGIC RHINITIS (ICD-477.9)  Her updated medication list for this problem includes:    Fluticasone Propionate 50 Mcg/act Susp (Fluticasone propionate) .[name]... 2 sprays each nostril qd Discussed use of allergy medications and environmental measures.   Complete Medication List: 1)  Bd U/f Mini Pen Needle 31g X 5 Mm Misc (Insulin pen needle) .... For two times a day injection 2)  Onetouch Ultra Test Strp (Glucose blood) .... For once daily testing 3)  Riomet 500 Mg/2ml Soln (Metformin hcl) .[name].. 10 ml twice daily 4)  Voltaren 1 % Gel (Diclofenac sodium) .... Apply qid 5)  Proair Hfa 108 (90 Base) Mcg/act Aers (Albuterol sulfate) .... 2 inh q4h as needed shortness of breath 6)  Nexium 40 Mg Cpdr (Esomeprazole magnesium) .... One by mouth bid 30 min before meal 7)  Symbicort 160-4.5 Mcg/act Aero (Budesonide-formoterol fumarate) .... 2 puffs two times a day 8)  Albuterol Sulfate (2.5 Mg/54ml) 0.083% Nebu (Albuterol sulfate) .... Use qid as needed 9)  Ocella 3-0.03 Mg Tabs (Drospirenone-ethinyl estradiol) .... Take 1 tablet by mouth once a day 10)  Fluticasone Propionate 50 Mcg/act Susp (Fluticasone propionate) .... 2 sprays each nostril qd   Patient Instructions: 1)  Please schedule a follow-up appointment in 3 months. 2)  BMP prior to visit, ICD-9:  250.02 3)  Hepatic Panel prior to visit, ICD-9: 272.4 4)  Lipid Panel prior to visit, ICD-9: 272.4 5)  HbgA1C prior to visit, ICD-9: 250.02 6)  Vitamin d level:  V70 7)  Please return for lab work within 2  months.   Prescriptions: FLUTICASONE PROPIONATE 50 MCG/ACT SUSP (FLUTICASONE PROPIONATE) 2 sprays each nostril qd  #1 x 3   Entered and Authorized by:   D. Thomos Lemons DO   Signed by:   D. Thomos Lemons DO on 02/07/2009  Method used:   Electronically to        [facility] [phone]* (retail)       [address]       [place], [place]  [address]       Ph: [phone]       Fax: [phone]   RxID:   [id number] DIOVAN 80 MG  TABS (VALSARTAN) Take 1 tablet by mouth once a day as directed  #30 x 3   Entered by:   Glendell Docker CMA   Authorized by:   D. Thomos Lemons DO   Signed by:   Glendell Docker CMA on 02/07/2009   Method used:   Electronically to        [facility]. [phone]* (retail)       [address]       [place], [place]  [address]       Ph: [phone]       Fax: [phone]   RxID:   [id number]   Current Allergies (reviewed today): ! ACTOS (PIOGLITAZONE HCL)

## 2011-01-02 NOTE — Consult Note (Signed)
Summary: [facility]   Imported By: Esmeralda Links D'jimraou 08/05/2008 12:08:17  _____________________________________________________________________  External Attachment:    Type:   Image     Comment:   External Document

## 2011-01-02 NOTE — Progress Notes (Signed)
Summary: Nueropathy & Work note  Phone Note Call from Patient Call back at Work Phone [phone]   Caller: Patient Call For: D. Thomos Lemons DO Summary of Call: patient called and left voice message requesting a letter for special consideration for medical reasons until she can find out what is going on with her. She ask that the note be non specific because she does not want employer to know what her health status is. She is also wanting to know what her options are for her neuropathy. She states she is unable to get sleep due to the increase in pain at bed time, which she says is not relieved by taking tylenol, advil, etc.   If work note approved she would like the note faxed to her at [phone] Initial call taken by: Glendell Docker CMA,  October 19, 2008 5:08 PM  Follow-up for Phone Call        [facility] for work note.  It should state pt is being evaluated for severe fatigue of unclear etiology.   Inform pt - liver enzymes are slightly worse.  She should stop Crestor. I rec she follow up with Dr. Shelle Iron for trial of CPAP. Follow-up by: D. Thomos Lemons DO,  October 19, 2008 6:14 PM  Additional Follow-up for Phone Call Additional follow up Details #1::        She would like to know what she should do about her leg pain. It is increasingly getting worse.She states she has been using the Epsom salt soaks and  taking otc meds and it helps very little  FYI- she asked to inform you that she has been approved for her Lap Band surgery, she is awaiting date. Additional Follow-up by: Glendell Docker CMA,  October 20, 2008 8:51 AM    Additional Follow-up for Phone Call Additional follow up Details #2::    I rec she try gabapentin at bedtime.  See rx. Follow-up by: D. Thomos Lemons DO,  October 24, 2008 10:46 PM  Additional Follow-up for Phone Call Additional follow up Details #3:: Details for Additional Follow-up Action Taken: patient asdvised per Dr Artist Pais instructions Additional Follow-up by: Glendell Docker CMA,  October 25, 2008 9:09 AM  New/Updated Medications: GABAPENTIN 100 MG CAPS (GABAPENTIN) one to two caps by mouth at bedtime as needed   Prescriptions: GABAPENTIN 100 MG CAPS (GABAPENTIN) one to two caps by mouth at bedtime as needed  #60 x 1   Entered and Authorized by:   D. Thomos Lemons DO   Signed by:   D. Thomos Lemons DO on 10/24/2008   Method used:   Electronically to        [facility]. [phone]* (retail)       [address]       [place], [place]  [address]       Ph: [phone]       Fax: [phone]   RxID:   [id number]

## 2011-01-02 NOTE — Progress Notes (Signed)
Summary: Weight Loss Info  ---- Converted from flag ---- ---- 04/13/2008 5:40 PM, D. Thomos Lemons DO wrote:   ---- 04/13/2008 5:38 PM, D. Thomos Lemons DO wrote: call pt - I need list of weight loss attempts (diets, exercise programs, medications) with dates and results in order to complete letter of medical necessity.  We can only go back two years.  Did she go to any other doctors in the past?  Any other medical providers who may have documented her weight and/or wt loss attempts? ------------------------------ Left message at [phone] to callPhone Note Outgoing Call   Summary of Call: call pt - she can pick up letter. Initial call taken by: D. Thomos Lemons DO,  Apr 30, 2008 5:54 PM  Follow-up for Phone Call        Voice message left to call. Pt aware letter ready Follow-up by: Glendell Docker,  May 03, 2008 1:49 PM       Patient states she has only visited with the Diabetes center, our office and Dr [name]. She states she will fax the information today  that she currently has. Glendell Docker  Apr 14, 2008 11:33 AM    Spoke with patient informing her the information faxed was received. She would like to know the status once it is complete Glendell Docker  Apr 15, 2008 4:49 PM

## 2011-01-02 NOTE — Letter (Signed)
Summary: [facility]  [facility]   Imported By: Lanelle Bal 09/22/2010 12:52:56  _____________________________________________________________________  External Attachment:    Type:   Image     Comment:   External Document

## 2011-01-02 NOTE — Progress Notes (Signed)
Summary: seen 03/09/10 not better  Phone Note Call from Patient Call back at [phone] until 4:30   Caller: Patient Summary of Call: seen 03/09/10 for bronchitis, still have continued cough and sob better but not gone --cough med keeps me awake at night, almost hyper Initial call taken by: Kandice Hams,  March 17, 2010 3:16 PM  Follow-up for Phone Call        cough may continue for weeks after illness improves.  can stop codeine cough syrup if it's making her hyper.  can switch to Delsym or Robitussin over the counter as needed.  as long as shortness of breath is improving she should continue with the steroids and inhaler as directed.  abx, even though she finished them, are still in her system. Follow-up by: Neena Rhymes MD,  March 17, 2010 3:31 PM  Additional Follow-up for Phone Call Additional follow up Details #1::        spoke wit pt and informed of Dr Beverely Low recommendations .Kandice Hams  March 17, 2010 3:42 PM  Additional Follow-up by: Kandice Hams,  March 17, 2010 3:42 PM

## 2011-01-02 NOTE — Progress Notes (Signed)
Summary: Difficulty Walking  Phone Note Call from Patient Call back at Home Phone [phone]   Caller: Patient Summary of Call:  8:19 am Patient called and left voice message stating she is having numbness from the waist down and difficulty walking. She would like to know if she could be seen today, or does she need to go to the ER Initial call taken by: Glendell Docker CMA,  June 10, 2009 8:56 AM  Follow-up for Phone Call        Go to ER for evaluation Follow-up by: D. Thomos Lemons DO,  June 10, 2009 9:00 AM  Additional Follow-up for Phone Call Additional follow up Details #1::        patient advised per Dr Artist Pais instructions Additional Follow-up by: Glendell Docker CMA,  June 10, 2009 9:10 AM

## 2011-01-02 NOTE — Assessment & Plan Note (Signed)
Summary: f/u   Vital Signs:  Patient Profile:   35 Years Old Female Height:     64 inches Weight:      266 pounds BMI:     45.82 Temp:     98.6 degrees F oral Pulse rate:   76 / minute Pulse rhythm:   regular Resp:     18 per minute BP sitting:   126 / 94  (right arm) Cuff size:   large  Vitals Entered By: Glendell Docker CMA (November 24, 2008 11:08 AM)                 PCP:  Dondra Spry DO  Chief Complaint:  Follow up disease management and Type 2 diabetes mellitus follow-up.  History of Present Illness: Type 2 Diabetes Mellitus Follow-Up      This is a 35 year old woman who presents for Type 2 diabetes mellitus follow-up.  The patient denies self managed hypoglycemia and hypoglycemia requiring help.  Other symptoms include neuropathic pain.  The patient denies the following symptoms: chest pain.  Since the last visit the patient reports good dietary compliance.  The patient has been measuring capillary blood glucose before breakfast.    low bs 120 high 150,avg 130  Numbness/pain in her feet unchanged.   Gabapentin helping.   She is able to sleep through the night.   She can't take gabapentin during the day due to somnolence.    Current Allergies: ! ACTOS (PIOGLITAZONE HCL)  Past Medical History:    Asthma    Diabetes mellitus, type II    GERD    Hypertension    PCOS    Gastroparesis    Fatty Liver (NASH)       Polyneuropathy   Social History:    Married    Never Smoked     Alcohol use-no            Physical Exam  General:     alert and overweight-appearing.   Lungs:     normal respiratory effort and normal breath sounds.   Heart:     normal rate, regular rhythm, and no gallop.   Abdomen:     soft and non-tender.   Extremities:     trace left pedal edema and trace right pedal edema.    Diabetes Management Exam:    Foot Exam (with socks and/or shoes not present):       Sensory-Monofilament:          Left foot: diminished          Right  foot: diminished       Inspection:          Left foot: normal          Right foot: normal       Nails:          Left foot: normal          Right foot: normal    Impression & Recommendations:  Problem # 1:  PARESTHESIA (ICD-782.0) Pt able to sleep w/o discomfort with gabapentin.  She is unable to take during the day due to somnolence.   She likely has diabetic polyneuropathy but will check B12 level.  Orders: TLB-B12 + Folate Pnl ([id number]-G95/AOZ)   Problem # 2:  DIABETES MELLITUS, TYPE II (ICD-250.00) Pt reports AM CBG avg 130's.   Continue riomet for now.   She is schedule for lap band surgery in Feb, 2009.  Her updated medication list for this  problem includes:    Diovan 80 Mg Tabs (Valsartan) ..... One by mouth once daily    Riomet 500 Mg/38ml Soln (Metformin hcl) .Marland KitchenMarland KitchenMarland KitchenMarland Kitchen 10 ml twice daily  Orders: TLB-A1C / Hgb A1C (Glycohemoglobin) (83036-A1C) TLB-Microalbumin/Creat Ratio, Urine (82043-MALB)  Labs Reviewed: HgBA1c: 7.3 (09/02/2008)   Creat: 0.6 (10/18/2008)   Microalbumin: 3.3 (09/02/2008)   Problem # 3:  HYPERTENSION (ICD-401.9) BP stable.   Maintain current medication regimen.  Her updated medication list for this problem includes:    Diovan 80 Mg Tabs (Valsartan) ..... One by mouth once daily  BP today: 126/94 Prior BP: 122/88 (10/18/2008)  Labs Reviewed: Creat: 0.6 (10/18/2008) Chol: 265 (12/31/2007)   HDL: 36.7 (12/31/2007)   LDL: 203.2 (12/31/2007)   TG: 132 (12/31/2007)   Complete Medication List: 1)  Bd U/f Mini Pen Needle 31g X 5 Mm Misc (Insulin pen needle) .... For two times a day injection 2)  Onetouch Ultra Test Strp (Glucose blood) .... For once daily testing 3)  Diovan 80 Mg Tabs (Valsartan) .... One by mouth once daily 4)  Riomet 500 Mg/75ml Soln (Metformin hcl) .[name].. 10 ml twice daily 5)  Voltaren 1 % Gel (Diclofenac sodium) .... Apply qid 6)  Gabapentin 100 Mg Caps (Gabapentin) .... One to two caps by mouth at bedtime as needed   Patient  Instructions: 1)  Please schedule a follow-up appointment in 4 months. 2)  BMP prior to visit, ICD-9:  401.9 3)  Hepatic Panel prior to visit, ICD-9: 571.8 4)  HbgA1C prior to visit, ICD-9:  250.02 5)  Please return for lab work one (1) week before your next appointment.    ]

## 2011-01-02 NOTE — Letter (Signed)
Summary: wt loss history/[facility]  wt loss history/[facility]   Imported By: Lester Marshallton 05/06/2008 08:54:31  _____________________________________________________________________  External Attachment:    Type:   Image     Comment:   External Document

## 2011-01-02 NOTE — Progress Notes (Signed)
Summary: Lab work  Programme researcher, broadcasting/film/video of Call: pt needs f/u visit within 1 month for diabetes follow up. plz schedule following labs before OV BMP prior to visit, ICD-9:  250.00 Hepatic Panel prior to visit, ICD-9:  250.00, 272.4 Lipid Panel prior to visit, ICD-9:  250.00. 272.4 TSH prior to visit, ICD-9: 272.4 HbgA1C prior to visit, ICD-9: 250.00 Urine Microalbumin prior to visit, ICD-9: 250.00  Initial call taken by: D. Thomos Lemons DO,  Apr 06, 2010 6:00 PM  Follow-up for Phone Call        call placed to patient at [phone], no answer, detailed voice message left for patient to return call to schedule one month follow up blood work per Dr Artist Pais instructions. She was advised to call with the location that she would like to have the labs drawn.   Labs have been entered for the [facility] for 05/02/10 Follow-up by: Glendell Docker CMA,  Apr 07, 2010 3:19 PM

## 2011-01-02 NOTE — Assessment & Plan Note (Signed)
Summary: 6 WK ROV/ $50 /NWS   Vital Signs:  Patient Profile:   35 Years Old Female Height:     64 inches Weight:      267.13 pounds BMI:     46.02 Temp:     97.3 degrees F oral Pulse rate:   84 / minute BP sitting:   136 / 98  (right arm)  Vitals Entered By: Glendell Docker (March 02, 2008 8:33 AM)                 Chief Complaint:  6 WEEK FOLLOW UP .  History of Present Illness: 35 year old hispanic female complains of severe fatigue and anorexia.   She wants to sleep all the time.  She fell asleep at work in the morning.  She denies fever and upper resp symptoms.  She has very poor appetite.  She denies chest pain or shortness of breath.  She was seen by Dr. Marina Goodell.   She likely has NASH.   He recommended pt consider gastric bypass surgery.  She is not ready for this option.  She would like to exhaust all other options.      Current Allergies (reviewed today): ! ACTOS (PIOGLITAZONE HCL)  Past Medical History:    Reviewed history from 06/27/2007 and no changes required:       Asthma       Diabetes mellitus, type II       GERD       Hypertension       PCOS       Gastroparesis  Past Surgical History:    Reviewed history from 06/27/2007 and no changes required:       Cholecystectomy       D&C   Social History:    Reviewed history from 01/19/2008 and no changes required:       Married       Never Smoked       Alcohol use-no       Daughter with bipolar depression    Review of Systems      See HPI   Physical Exam  General:     alert and overweight-appearing.   Head:     normocephalic and atraumatic.   Eyes:     vision grossly intact, pupils equal, pupils round, and pupils reactive to light.   Ears:     R ear normal and L ear normal.   Mouth:     Oral mucosa and oropharynx without lesions or exudates.  Teeth in good repair. Neck:     supple and no masses.   Lungs:     normal respiratory effort and normal breath sounds.   Heart:     normal rate,  regular rhythm, no murmur, and no gallop.   Abdomen:     soft and mild RUQ tenderness Extremities:     No lower extremity edema  Psych:     normally interactive and good eye contact.      Impression & Recommendations:  Problem # 1:  FATIGUE (ICD-780.79) Pt reports severe fatigue.   She wants to sleep all the time.  She fell asleep at work.  No signs or symptoms of infection.  We discussed possibility of sleep apnea. Orders: TLB-CBC Platelet - w/Differential (85025-CBCD) TLB-BMP (Basic Metabolic Panel-BMET) (80048-METABOL) TLB-Hepatic/Liver Function Pnl (80076-HEPATIC) TLB-Sedimentation Rate (ESR) (85651-ESR)   Problem # 2:  ANOREXIA (ICD-783.0) Pt reports very poor appetite.  Her weight is stable.  Hold Byetta for now.  Rule out metabolic derangement.  Labs pending.  Problem # 3:  DIABETES MELLITUS, TYPE II (ICD-250.00) Pt's am CBG 160-200.  Some of her fatigue may be secondary to uncontrolled diabetes.   Start basal insulin.  The following medications were removed from the medication list:    Byetta 5 Mcg Pen 5 Mcg/0.22ml Soln (Exenatide) ..... Inject subcutaneously two times a day  Her updated medication list for this problem includes:    Glucophage Xr 500 Mg Tb24 (Metformin hcl) ..... One by mouth bid    Diovan 80 Mg Tabs (Valsartan) ..... One by mouth once daily    Lantus Solostar 100 Unit/ml Soln (Insulin glargine) .Marland KitchenMarland KitchenMarland KitchenMarland Kitchen 10 units q am   Complete Medication List: 1)  Glucophage Xr 500 Mg Tb24 (Metformin hcl) .... One by mouth bid 2)  Bd U/f Mini Pen Needle 31g X 5 Mm Misc (Insulin pen needle) .... For two times a day injection 3)  Onetouch Ultra Test Strp (Glucose blood) .... For once daily testing 4)  Diovan 80 Mg Tabs (Valsartan) .... One by mouth once daily 5)  Ciprofloxacin Hcl 500 Mg Tabs (Ciprofloxacin hcl) .[name].. 1 by mouth bid 6)  Omeprazole 20 Mg Tbec (Omeprazole) .[name].. 1 by mouth qd 7)  Lantus Solostar 100 Unit/ml Soln (Insulin glargine) .[name].. 10 units q  am   Patient Instructions: 1)  Please schedule a follow-up appointment in 2 weeks.    ] Current Allergies (reviewed today): ! ACTOS (PIOGLITAZONE HCL)

## 2011-01-02 NOTE — Letter (Signed)
     January 01, 2008   [facility] [address] [place], [place] [address]  RE:  LAB RESULTS  Dear  Ms. Rob Hickman,  The following is an interpretation of your most recent lab tests.  Please take note of any instructions provided or changes to medications that have resulted from your lab work.  ELECTROLYTES:  Good - no changes needed  KIDNEY FUNCTION TESTS:  Good - no changes needed  LIVER FUNCTION TESTS:  Abnormal - schedule a follow-up appointment  LIPID PANEL:  Abnormal - schedule a follow-up appointment Triglyceride: 132   Cholesterol: 265   LDL: DEL   HDL: 36.7   Chol/HDL%:  7.2 CALC   DIABETIC STUDIES:  Fair - schedule a follow-up appointment Blood Glucose: 147   HgbA1C: 6.9   Microalbumin/Creatinine Ratio: 6.0     CBC:  Good - no changes needed  Please make a follow up appointment within 1 month to further discuss you labs.   Sincerely Yours,    Dr. Thomos Lemons

## 2011-01-02 NOTE — Progress Notes (Signed)
Summary: Nebulizer Medication  Phone Note Call from Patient Call back at Home Phone [phone]   Caller: Patient Summary of Call: patient called and left voice message requesting a rx for Nebulizer treatment. She states that her pharmacy [facility] on [address] does not have a Rx on file for her.  Initial call taken by: Glendell Docker CMA,  January 17, 2009 1:46 PM  Follow-up for Phone Call        see rx Follow-up by: D. Thomos Lemons DO,  January 17, 2009 5:36 PM  Additional Follow-up for Phone Call Additional follow up Details #1::        Patient informed. Additional Follow-up by: Darra Lis RMA,  January 18, 2009 9:49 AM    New/Updated Medications: ALBUTEROL SULFATE (2.5 MG/3ML) 0.083% NEBU (ALBUTEROL SULFATE) use qid as needed   Prescriptions: ALBUTEROL SULFATE (2.5 MG/3ML) 0.083% NEBU (ALBUTEROL SULFATE) use qid as needed  #1 month x 1   Entered and Authorized by:   D. Thomos Lemons DO   Signed by:   D. Thomos Lemons DO on 01/17/2009   Method used:   Electronically to        [facility]. [phone]* (retail)       [address]       [place], [place]  [address]       Ph: [phone]       Fax: [phone]   RxID:   [id number]

## 2011-01-02 NOTE — Letter (Signed)
   [place] [facility] [address] [place], [place]  [address]  [place] Phone: [phone]      November 24, 2008   [facility] [address] [place], [place] [address]  RE:  LAB RESULTS  Dear  Ms. Rob Hickman,  The following is an interpretation of your most recent lab tests.  Please take note of any instructions provided or changes to medications that have resulted from your lab work.    DIABETIC STUDIES:  Good - no changes needed Blood Glucose: 135   HgbA1C: 7.2   Microalbumin/Creatinine Ratio: 9.5     B12 levels - normal.       Sincerely Yours,    Dr. Thomos Lemons

## 2011-01-02 NOTE — Letter (Signed)
Summary: [facility]   Imported By: Lanelle Bal 03/21/2009 11:36:43  _____________________________________________________________________  External Attachment:    Type:   Image     Comment:   External Document

## 2011-01-02 NOTE — Letter (Signed)
Summary: [facility]  [facility]   Imported By: Lanelle Bal 01/26/2009 12:02:00  _____________________________________________________________________  External Attachment:    Type:   Image     Comment:   External Document

## 2011-01-02 NOTE — Assessment & Plan Note (Signed)
Summary: cpx patient fasting/mhf resch per pt/dt   Vital Signs:  Patient profile:   35 year old female Height:      64 inches Weight:      266.25 pounds BMI:     45.87 O2 Sat:      93 % on Room air Temp:     98.3 degrees F oral Pulse rate:   87 / minute Pulse rhythm:   regular Resp:     18 per minute BP sitting:   114 / 80  (right arm) Cuff size:   large  Vitals Entered By: Glendell Docker CMA (September 04, 2010 8:11 AM)  O2 Flow:  Room air CC: CPX Is Patient Diabetic? No Pain Assessment Patient in pain? no       Does patient need assistance? Functional Status Self care Ambulation Normal Comments refill on dulera, ventolin and albuterol   Primary Care Provider:  Dondra Spry DO  CC:  CPX.  History of Present Illness: 35 y/o for routine cpx  MS is not in control dropping things,  motor skills are "not there" fingers are numb - symptoms are worse on right hand multiple falls also burns herself because - arms get weak she is not cooking anymore currently taking lyrica - started one week prednisone restarted can't continue rabif due to flu like symptoms  appt with neurologist in November  asthma - allergy component is worse she thinks she is allergic to her daughter 's [place] pigs  DM II - has not been checking her blood sugars no polyuria or polydipsia  V 70 - has appt for PAP and Pelvic next month  Current Diet Breakfast: hard boiled egg Lunch: sandwich, gold fish crackers DInner: what ever husband brings home - pizza, chicken Snacks: popcicles, sunflower seeds Beverage: water, OJ, apple juice.     Preventive Screening-Counseling & Management  Alcohol-Tobacco     Alcohol drinks/day: <1     Smoking Status: never  Caffeine-Diet-Exercise     Caffeine use/day: None     Does Patient Exercise: yes     Type of exercise: swimming     Times/week: 3  Allergies: 1)  ! Actos (Pioglitazone Hcl)  Past History:  Past Medical  History: Asthma Diabetes mellitus, type II GERD Hypertension    PCOS  Gastroparesis Fatty Liver (NASH)    Polyneuropathy      Multple Sclerosis  (Dr Anne Hahn)  Past Surgical History: Cholecystectomy D&C        Lap Band surgery - 01/26/2008     Social History: Caffeine use/day:  None Does Patient Exercise:  yes  Review of Systems       The patient complains of weight gain.  The patient denies fever, vision loss, chest pain, syncope, abdominal pain, melena, hematochezia, severe indigestion/heartburn, and depression.    Physical Exam  General:  alert and overweight-appearing.   Eyes:  pupils equal, pupils round, and pupils reactive to light.   Ears:  R ear normal and L ear normal.   Mouth:  no dental plaque.   Neck:  supple and no masses.   Lungs:  normal respiratory effort, normal breath sounds, no crackles, and no wheezes.   Heart:  normal rate, regular rhythm, no murmur, and no gallop.   Abdomen:  soft, non-tender, normal bowel sounds, no masses, no hepatomegaly, and no splenomegaly.   Extremities:  trace left pedal edema and trace right pedal edema.   Neurologic:  cranial nerves II-XII intact and gait normal.  Psych:  normally interactive, good eye contact, not anxious appearing, and not depressed appearing.     Impression & Recommendations:  Problem # 1:  PREVENTIVE HEALTH CARE (ICD-V70.0) Reviewed adult health maintenance protocols.  Pap smear: normal (08/18/2007) Flu Vax: Fluvax Non-MCR (09/02/2008)   Pneumovax: Pneumovax (09/02/2008) Chol: 265 (12/31/2007)   HDL: 36.7 (12/31/2007)   LDL: DEL (12/31/2007)   TG: 132 (12/31/2007) TSH: 2.23 (10/18/2008)   HgbA1C: 7.2 (11/24/2008)     Problem # 2:  DIABETES MELLITUS, TYPE II (ICD-250.00) Pt counseled on diet and exercise.  Orders: T- Hemoglobin A1C ([id number]) T-Urine 24hr. Creatinine Clearance [phone])  Labs Reviewed: Creat: 0.6 (10/18/2008)     Last Eye Exam: normal (01/19/2009) Reviewed HgBA1c  results: 7.2 (11/24/2008)  7.3 (09/02/2008)  Problem # 3:  HYPERTENSION (ICD-401.9) Assessment: Unchanged  Orders: T-Basic Metabolic Panel [phone])  BP today: 114/80 Prior BP: 120/80 (03/20/2010)  Labs Reviewed: K+: 3.8 (10/18/2008) Creat: : 0.6 (10/18/2008)   Chol: 265 (12/31/2007)   HDL: 36.7 (12/31/2007)   LDL: DEL (12/31/2007)   TG: 132 (12/31/2007)  Complete Medication List: 1)  Rebif 44 Mcg/0.57ml Soln (Interferon beta-1a) .... Inject subcutaneously three times per week 2)  Dulera 100-5 Mcg/act Aero (Mometasone furo-formoterol fum) .... 2 puffs two times a day 3)  Prednisone 20 Mg Tabs (Prednisone) .... Taper dose as directed 4)  Ventolin Hfa 108 (90 Base) Mcg/act Aers (Albuterol sulfate) .... 2 puffs q4 as needed for wheezing 5)  Albuterol Sulfate (2.5 Mg/65ml) 0.083% Nebu (Albuterol sulfate) .... Use qid as needed 6)  Lyrica 100 Mg Caps (Pregabalin) .... Take 1 capsule by mouth three times a day  Other Orders: T-Hepatic Function [phone]) T-TSH [id number]) T-CBC No Diff ([id number]) Flu Vaccine 37yrs + ([address]956) Admin 1st Vaccine (38756)  Patient Instructions: 1)  Please schedule a follow-up appointment in 3 months.  Current Allergies (reviewed today): ! ACTOS (PIOGLITAZONE HCL)     Immunization History:  Tetanus/Td Immunization History:    Tetanus/Td:  Historical (08/15/2010)  Immunizations Administered:  Influenza Vaccine # 1:    Vaccine Type: Fluvax 3+    Site: left deltoid    Mfr: GlaxoSmithKline    Dose: 0.5 ml    Route: IM    Given by: Glendell Docker CMA    Exp. Date: 06/02/2011    Lot #: [id number]    VIS given: 06/27/10 version given September 04, 2010.   Immunization History:  Tetanus/Td Immunization History:    Tetanus/Td:  historical (08/15/2010)  Immunizations Administered:  Influenza Vaccine # 1:    Vaccine Type: Fluvax 3+    Site: left deltoid    Mfr: GlaxoSmithKline    Dose: 0.5 ml    Route: IM    Given by: Glendell Docker CMA    Exp. Date: 06/02/2011    Lot #: [id number]    VIS given: 06/27/10 version given September 04, 2010.  Flu Vaccine Consent Questions:    Do you have a history of severe allergic reactions to this vaccine? no    Any prior history of allergic reactions to egg and/or gelatin? no    Do you have a sensitivity to the preservative Thimersol? no    Do you have a past history of Guillan-Barre Syndrome? no    Do you currently have an acute febrile illness? no    Have you ever had a severe reaction to latex? no    Vaccine information given and explained to patient? yes    Are you currently pregnant? no

## 2011-01-02 NOTE — Letter (Signed)
Summary: Out of Work  Adult nurse at [employer]. [address]   [place], [place] [address]   Phone: [phone]  Fax: [phone]       September 17, 2008       Employee:  [name]    To Whom It May Concern:   For Medical reasons, please excuse the above named employee from work for the following dates:  Start:   09/17/08    End:   09/20/08  She may return to work on 09/20/08. If you need additional information, please feel free to contact our office.          Sincerely,    Glendell Docker CMA Dr Thomos Lemons

## 2011-01-02 NOTE — Assessment & Plan Note (Signed)
Summary: MS - new   Vital Signs:  Patient profile:   35 year old female Height:      64 inches Weight:      241 pounds BMI:     41.52 O2 Sat:      97 % on Room air Temp:     98.4 degrees F oral Pulse rate:   94 / minute BP sitting:   116 / 74  (left arm) Cuff size:   large  Vitals Entered By: Payton Spark CMA (June 16, 2009 10:30 AM)  O2 Flow:  Room air CC: New Dx of MS. Needs wheelchair and other supplies for everyday living.    Primary Care Provider:  Dondra Spry DO  CC:  New Dx of MS. Needs wheelchair and other supplies for everyday living. [name]  History of Present Illness: 35 yo obese AAF with T2DM presents for HFU visit.  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  She had an MRI and LP c/w the dx of mulitple sclerosis.  She was started on steroids and started PT yesterday.  She denies a hx of dragging her legs, sudden vision changes or chronic paresthesias but she did have a dx of diabetic peripheral neuropathy.  Denies a fam hx of MS.  She is set up to see Dr Anne Hahn outpt in about 2 wks.  Since admission, she has regains her bowel control and has improvement in bladder control.  She is able to feel her leg better (the L side mostly was affected).  She still has some L foot numbness.  No UE symptoms at all.  She is able to transfer indepenedently and walk very short distances.  She is finding PT 'hard'.  Going 3 x a wk for 8 wks.  Feels unable to do her job at [employer] b/c it requires a lot of walking.  She would like to get a wheelchair, walker, shower chair and detachable shower head.    She also brings in paperwork for a handicapt placard today.    Current Medications (verified): 1)  Onetouch Ultra Test   Strp (Glucose Blood) .... For Once Daily Testing 2)  Riomet 500 Mg/8ml Soln (Metformin Hcl) .[name].. 10 Ml Twice Daily 3)  Depo-Provera 150 Mg/ml Susp  (Medroxyprogesterone Acetate) .... One Shot Im Every 3 Months 4)  Prednisone 10 Mg Tabs (Prednisone) .... Take As Directed For 12 Days  Allergies (verified): 1)  ! Actos (Pioglitazone Hcl)  Comments:  Nurse/Medical Assistant: Payton Spark CMA (June 16, 2009 10:32 AM) The patient's medications were reviewed with the patient and were updated in the Medication List.  Past History:  Past Medical History: Asthma Diabetes mellitus, type II GERD Hypertension PCOS Gastroparesis Fatty Liver (NASH)    Polyneuropathy      MS (Dr Anne Hahn)  Past Surgical History: Reviewed history from 03/23/2009 and no changes required. Cholecystectomy D&C        Lap Band surgery - 01/26/2008  Social History: Married Never Smoked   Alcohol use-no       works at [employer]  Review of [employer]:  Denies fatigue, fever, sleep disorder, and weakness. Eyes:  Denies blurring and eye pain. ENT:  Denies difficulty swallowing. CV:  Denies chest pain or discomfort, shortness of breath with exertion, and swelling of feet. Resp:  Denies shortness of breath. GI:  Denies constipation and diarrhea. GU:  Complains  of incontinence. Neuro:  Complains of poor balance, tingling, and weakness; denies falling down and headaches.  Physical Exam  General:  obese AAF in NAD Head:  [place]/AT Eyes:  PERRLA Nose:  no nasal discharge.   Mouth:  pharynx pink and moist.   Neck:  no masses.   Lungs:  Normal respiratory effort, chest expands symmetrically. Lungs are clear to auscultation, no crackles or wheezes. Heart:  Normal rate and regular rhythm. S1 and S2 normal without gallop, murmur, click, rub or other extra sounds. Msk:  able to actively move the L hip, knee and ankle Pulses:  2+ radial and pedal pulses Extremities:  trace ankle edema bilat Neurologic:  wide based slow gait, no clonus. sensation intact to light touch and DTRs symmetrical and normal.   Skin:  color normal.   Cervical Nodes:  No lymphadenopathy  noted Psych:  good eye contact, not anxious appearing, and not depressed appearing.     Impression & Recommendations:  Problem # 1:  MULTIPLE SCLEROSIS (ICD-340) Assessment New She is in PT and has appt for f/u with Dr Anne Hahn.  I reviewed her hospital discharge summary and completed handicapt application and FMLA papers for short term out of work x 1 month.  I had a long discusssion w/ her about MS and gave her h/o from [email or web address].  She has asked for DME equipment which I will send to [facility].  I did tell her that I expect much of her weakness to greatly improve and she will likely not need this.  I want her to finish out her steroids and work hard at PT to regain strength.  REassured by her improved sensation and improvement in bowel and bladder habits.  will need more time to see if she falls into a relapsing/ remitting type.  I expect her to be able to go back to her regular job in about a month.  Problem # 2:  DIABETES MELLITUS, TYPE II (ICD-250.00) Steroids have increased her sugars to the 200s to 300s.  Start Novolog for SSI.  She already knows how to self- inject. Her updated medication list for this problem includes:    Riomet 500 Mg/66ml Soln (Metformin hcl) .Marland KitchenMarland KitchenMarland KitchenMarland Kitchen 10 ml twice daily    Novolog 100 Unit/ml Soln (Insulin aspart) ..... Use as directed for sliding scale insulin  Complete Medication List: 1)  Onetouch Ultra Test Strp (Glucose blood) .... For once daily testing 2)  Riomet 500 Mg/79ml Soln (Metformin hcl) .[name].. 10 ml twice daily 3)  Depo-provera 150 Mg/ml Susp (Medroxyprogesterone acetate) .... One shot im every 3 months 4)  Prednisone 10 Mg Tabs (Prednisone) .... Take as directed for 12 days 5)  Handicapt Accessible Shower Head  .... Use as directed  dx: ms 6)  Wheelchair Misc (Misc. devices) .... Dx ms 7)  Shower Chair  .... Dx: ms 8)  Rolling Walker  .... Use as directed dx: ms 9)  Novolog 100 Unit/ml Soln (Insulin aspart) .... Use as directed for sliding scale  insulin 10)  Bd Insulin Syringe 27.5g X 5/8" 2 Ml Misc (Insulin syringe-needle u-100) .... Use as directed  Patient Instructions: 1)  Sliding Scale with NOVOLOG 2)  BG 150-199: 2 unit   3)  BG 200-249: 3 units   4)  BG 250-299: 5 units  5)  BG 300-349: 7 units  6)  BG Over 350: 8 units  7)  >400 call MD 8)  F/U with Dr Anne Hahn and stay in PT. 9)  DME order  sent to [facility] to deliver your supplies. 10)  Paperwork completed. 11)  F/U with Dr Artist Pais in 4-5 wks. Prescriptions: BD INSULIN SYRINGE 27.5G X 5/8" 2 ML MISC (INSULIN SYRINGE-NEEDLE U-100) use as directed  #60 x 2   Entered and Authorized by:   Seymour Bars DO   Signed by:   Seymour Bars DO on 06/16/2009   Method used:   Electronically to        [facility]. [phone]* (retail)       [address]       [place], [place]  [address]       Ph: [phone]       Fax: [phone]   RxID:   [id number] NOVOLOG 100 UNIT/ML SOLN (INSULIN ASPART) use as directed for Sliding Scale insulin  #1 vial x 1   Entered and Authorized by:   Seymour Bars DO   Signed by:   Seymour Bars DO on 06/16/2009   Method used:   Electronically to        [facility]. [phone]* (retail)       [address]       [place], [place]  [address]       Ph: [phone]       Fax: [phone]   RxID:   [id number] Levan Hurst use as directed Dx: MS  #1 x 0   Entered and Authorized by:   Seymour Bars DO   Signed by:   Seymour Bars DO on 06/16/2009   Method used:   Print then Give to Patient   RxID:   [id number] URKYHC CHAIR Dx: MS  #1 x 0   Entered and Authorized by:   Seymour Bars DO   Signed by:   Seymour Bars DO on 06/16/2009   Method used:   Print then Give to Patient   RxID:   [id number] WHEELCHAIR  MISC (MISC. DEVICES) Dx MS  #1 x 0   Entered and Authorized by:   Seymour Bars DO   Signed by:   Seymour Bars DO on 06/16/2009   Method used:   Print then Give to  Patient   RxID:   [id number] HANDICAPT ACCESSIBLE SHOWER HEAD use as directed  Dx: MS  #1 x 0   Entered and Authorized by:   Seymour Bars DO   Signed by:   Seymour Bars DO on 06/16/2009   Method used:   Print then Give to Patient   RxID:   [id number]

## 2011-01-02 NOTE — Progress Notes (Signed)
Summary: letter status  Phone Note Call from Patient Call back at Work Phone [phone]   Caller: Patient Summary of Call: Patient called to check status of weight loss letter  that was req 2weeks ago. Paper work that was requested is in the yellow folder on Dr Waynette Buttery desk. Initial call taken by: Rock Nephew CMA,  Apr 29, 2008 3:01 PM  Follow-up for Phone Call        which yellow folder? Follow-up by: D. Thomos Lemons DO,  May 03, 2008 1:21 PM  Additional Follow-up for Phone Call Additional follow up Details #1::        Patient aware letter complete and ready for pick up voice message left Additional Follow-up by: Glendell Docker,  May 03, 2008 1:50 PM

## 2011-01-04 NOTE — Assessment & Plan Note (Signed)
Summary: CONGESTION/SOB/HEA   Vital Signs:  Patient profile:   35 year old female Height:      64 inches Weight:      255 pounds BMI:     43.93 O2 Sat:      97 % on Room air Temp:     98.1 degrees F oral Pulse rate:   93 / minute Resp:     22 per minute BP sitting:   110 / 80  (left arm) Cuff size:   large  Vitals Entered By: Glendell Docker CMA (December 15, 2010 10:52 AM)  O2 Flow:  Room air CC: Cough Is Patient Diabetic? Yes Pain Assessment Patient in pain? no      Comments productive cough green in color, trouble with laying flat, use of inhaler every 2 hours,    Primary Care Provider:  Dondra Spry DO  CC:  Cough.  History of Present Illness:  Cough      This is a 35 year old woman who presents with Cough.  The patient reports productive cough, shortness of breath, and wheezing.  Associated symtpoms include cold/URI symptoms and nasal congestion.    Preventive Screening-Counseling & Management  Alcohol-Tobacco     Smoking Status: never  Allergies: 1)  ! Actos (Pioglitazone Hcl)  Past History:  Past Medical History: Asthma Diabetes mellitus, type II GERD  Hypertension    PCOS  Gastroparesis Fatty Liver (NASH)    Polyneuropathy      Multple Sclerosis  (Dr Anne Hahn)  Past Surgical History: Cholecystectomy D&C        Lap Band surgery - 01/26/2008      Family History: Mother has diabetes, hypertension, and depresson Father has history of liver disease.  He died of cirrhosis. Family history of CAD Family history of ovarian cancer  Daughter with bipolar depression          Social History: Married Never Smoked    Alcohol use-no        works at [employer]    Review of Systems       also having migraine headache  Physical Exam  General:  alert, well-developed, and well-nourished.   Mouth:  pharynx pink and moist.   Neck:  No deformities, masses, or tenderness noted. Lungs:  normal respiratory effort.  scattered exp wheezing Heart:  normal rate,  regular rhythm, and no gallop.     Impression & Recommendations:  Problem # 1:  BRONCHITIS (ICD-490)  The following medications were removed from the medication list:    Dulera 100-5 Mcg/act Aero (Mometasone furo-formoterol fum) .[name]... 2 puffs two times a day    Ventolin Hfa 108 (90 Base) Mcg/act Aers (Albuterol sulfate) .[name]... 2 puffs q4 as needed for wheezing Her updated medication list for this problem includes:    Albuterol Sulfate (2.5 Mg/42ml) 0.083% Nebu (Albuterol sulfate) ..... Use qid as needed    Avelox 400 Mg Tabs (Moxifloxacin hcl) ..... One by mouth once daily    Dulera 200-5 Mcg/act Aero (Mometasone furo-formoterol fum) .[name]... 2 puffs two times a day  Orders: Solumedrol up to 125mg  (Z6109) Admin of Therapeutic Inj  intramuscular or subcutaneous ([address]) Albuterol Sulfate Sol 1mg  unit dose (U9811) Nebulizer Tx (91478)  Take antibiotics and other medications as directed. Encouraged to push clear liquids, get enough rest, and take acetaminophen as needed. To be seen in 5-7 days if no improvement, sooner if worse.  Problem # 2:  MIGRAINE HEADACHE (ICD-346.90) Assessment: Deteriorated  Orders: Promethazine up to 50mg  (J2550)  Complete Medication List: 1)  Rebif 44 Mcg/0.6ml Soln (Interferon beta-1a) .... Inject subcutaneously three times per week 2)  Albuterol Sulfate (2.5 Mg/22ml) 0.083% Nebu (Albuterol sulfate) .... Use qid as needed 3)  Lyrica 100 Mg Caps (Pregabalin) .... Take 1 capsule by mouth three times a day 4)  Avelox 400 Mg Tabs (Moxifloxacin hcl) .... One by mouth once daily 5)  Prednisone 20 Mg Tabs (Prednisone) .... One by mouth two times a day x 7 days, then on by mouth once daily x 7 days 6)  Dulera 200-5 Mcg/act Aero (Mometasone furo-formoterol fum) .... 2 puffs two times a day  Patient Instructions: 1)  Call our office if your symptoms do not  improve or gets worse. 2)  Please schedule a follow-up appointment in 1 month. Prescriptions: ALBUTEROL SULFATE  (2.5 MG/3ML) 0.083% NEBU (ALBUTEROL SULFATE) use qid as needed  #1 month x 1   Entered and Authorized by:   D. Thomos Lemons DO   Signed by:   D. Thomos Lemons DO on 12/15/2010   Method used:   Electronically to        [facility]. [phone]* (retail)       [address]       [place], [place]  [address]       Ph: [phone]       Fax: [phone]   RxID:   [id number] DULERA 200-5 MCG/ACT AERO (MOMETASONE FURO-FORMOTEROL FUM) 2 puffs two times a day  #1 x 1   Entered and Authorized by:   D. Thomos Lemons DO   Signed by:   D. Thomos Lemons DO on 12/15/2010   Method used:   Electronically to        [facility]. [phone]* (retail)       [address]       [place], [place]  [address]       Ph: [phone]       Fax: [phone]   RxID:   [id number] PREDNISONE 20 MG TABS (PREDNISONE) one by mouth two times a day x 7 days, then on by mouth once daily x 7 days  #21 x 0   Entered and Authorized by:   D. Thomos Lemons DO   Signed by:   D. Thomos Lemons DO on 12/15/2010   Method used:   Electronically to        [facility]. [phone]* (retail)       [address]       [place], [place]  [address]       Ph: [phone]       Fax: [phone]   RxID:   [id number] AVELOX 400 MG TABS (MOXIFLOXACIN HCL) one by mouth once daily  #10 x 0   Entered and Authorized by:   D. Thomos Lemons DO   Signed by:   D. Thomos Lemons DO on 12/15/2010   Method used:   Electronically to        [facility]. [phone]* (retail)       [address]       [place], [place]  [address]       Ph: [phone]       Fax: [phone]   RxID:   [id number]    Medication Administration  Injection # 1:    Medication: Solumedrol up to 125mg     Diagnosis: BRONCHITIS (ICD-490)    Route: IM    Site: LUOQ gluteus    Exp Date: 06/01/2013    Lot #: [id number]    Mfr: PFIZER    Patient tolerated  injection without complications    Given by: Glendell Docker CMA (December 15, 2010 11:37 AM)  Injection # 3:    Medication: Promethazine up to 50mg     Diagnosis: MIGRAINE HEADACHE (ICD-346.90)    Route: IM    Site: L deltoid    Exp Date: 11/14/2011    Lot #: [id number]    Mfr: NOVAPLUS    Comments: 25 MG IM    Patient tolerated injection without complications    Given by: Glendell Docker CMA (December 15, 2010 5:18 PM)  Medication # 1:    Medication: Albuterol Sulfate Sol 1mg  unit dose    Diagnosis: BRONCHITIS (ICD-490)    Dose: 2.5/3ML    Route: inhaled    Exp Date: 06/02/2011    Lot #: [id number]    Mfr: [employer]    Patient tolerated medication without complications    Given by: Glendell Docker CMA (December 15, 2010 11:37 AM)  Medication # 3:    Patient tolerated medication without complications  Orders Added: 1)  Solumedrol up to 125mg  [J2930] 2)  Admin of Therapeutic Inj  intramuscular or subcutaneous [96372] 3)  Albuterol Sulfate Sol 1mg  unit dose [J7613] 4)  Nebulizer Tx [94640] 5)  Promethazine up to 50mg  [J2550] 6)  Est. Patient Level III [01027]    Current Allergies (reviewed today): ! ACTOS (PIOGLITAZONE HCL)        Medication Administration  Injection # 1:    Medication: Solumedrol up to 125mg     Diagnosis: BRONCHITIS (ICD-490)    Route: IM    Site: LUOQ gluteus    Exp Date: 06/01/2013    Lot #: [id number]    Mfr: PFIZER    Patient tolerated injection without complications    Given by: Glendell Docker CMA (December 15, 2010 11:37 AM)  Injection # 3:    Medication: Promethazine up to 50mg     Diagnosis: MIGRAINE HEADACHE (ICD-346.90)    Route: IM    Site: L deltoid    Exp Date: 11/14/2011    Lot #: [id number]    Mfr: NOVAPLUS    Comments: 25 MG IM    Patient tolerated injection without complications    Given by: Glendell Docker CMA (December 15, 2010 5:18 PM)  Medication # 1:    Medication: Albuterol Sulfate Sol 1mg  unit dose    Diagnosis:  BRONCHITIS (ICD-490)    Dose: 2.5/3ML    Route: inhaled    Exp Date: 06/02/2011    Lot #: [id number]    Mfr: [employer]    Patient tolerated medication without complications    Given by: Glendell Docker CMA (December 15, 2010 11:37 AM)  Medication # 3:    Patient tolerated medication without complications  Orders Added: 1)  Solumedrol up to 125mg  [J2930] 2)  Admin of Therapeutic Inj  intramuscular or subcutaneous [96372] 3)  Albuterol Sulfate Sol 1mg  unit dose [J7613] 4)  Nebulizer Tx [94640] 5)  Promethazine up to 50mg  [J2550] 6)  Est. Patient Level III [[address]]

## 2011-02-20 LAB — BRAIN NATRIURETIC PEPTIDE: Pro B Natriuretic peptide (BNP): <30 pg/mL (ref 0.0–100.0)

## 2011-02-20 LAB — DIFFERENTIAL
Basophils Absolute: 0 10*3/uL (ref 0.0–0.1)
Basophils Relative: 1 % (ref 0–1)
Eosinophils Absolute: 0.1 10*3/uL (ref 0.0–0.7)
Eosinophils Relative: 1 % (ref 0–5)
Lymphocytes Relative: 33 % (ref 12–46)
Lymphs Abs: 2.5 10*3/uL (ref 0.7–4.0)
Monocytes Absolute: 1.3 10*3/uL — ABNORMAL HIGH (ref 0.1–1.0)
Monocytes Relative: 18 % — ABNORMAL HIGH (ref 3–12)
Neutro Abs: 3.6 10*3/uL (ref 1.7–7.7)
Neutrophils Relative %: 48 % (ref 43–77)

## 2011-02-20 LAB — URINALYSIS, ROUTINE W REFLEX MICROSCOPIC
Bilirubin Urine: NEGATIVE
Glucose, UA: NEGATIVE mg/dL
Hgb urine dipstick: NEGATIVE
Ketones, ur: NEGATIVE mg/dL
Nitrite: NEGATIVE
Protein, ur: NEGATIVE mg/dL
Specific Gravity, Urine: 1.024 (ref 1.005–1.030)
Urobilinogen, UA: 1 mg/dL (ref 0.0–1.0)
pH: 6 (ref 5.0–8.0)

## 2011-02-20 LAB — CBC
HCT: 40.8 % (ref 36.0–46.0)
Hemoglobin: 14.3 g/dL (ref 12.0–15.0)
MCHC: 35.1 g/dL (ref 30.0–36.0)
MCV: 90.7 fL (ref 78.0–100.0)
Platelets: 295 10*3/uL (ref 150–400)
RBC: 4.5 MIL/uL (ref 3.87–5.11)
RDW: 12.8 % (ref 11.5–15.5)
WBC: 7.5 10*3/uL (ref 4.0–10.5)

## 2011-02-20 LAB — CULTURE, BLOOD (ROUTINE X 2)
Culture: NO GROWTH
Culture: NO GROWTH

## 2011-02-20 LAB — BASIC METABOLIC PANEL
BUN: 7 mg/dL (ref 6–23)
CO2: 26 mEq/L (ref 19–32)
Calcium: 8.6 mg/dL (ref 8.4–10.5)
Chloride: 107 mEq/L (ref 96–112)
Creatinine, Ser: 0.51 mg/dL (ref 0.4–1.2)
GFR calc Af Amer: >60 mL/min (ref >60)
GFR calc non Af Amer: >60 mL/min (ref >60)
Glucose, Bld: 141 mg/dL — ABNORMAL HIGH (ref 70–99)
Potassium: 3.7 mEq/L (ref 3.5–5.1)
Sodium: 139 mEq/L (ref 135–145)

## 2011-02-20 LAB — TROPONIN I: Troponin I: 0.01 ng/mL (ref 0.00–0.06)

## 2011-02-20 LAB — PREGNANCY, URINE: Preg Test, Ur: NEGATIVE

## 2011-03-08 LAB — URINE MICROSCOPIC-ADD ON

## 2011-03-08 LAB — PREGNANCY, URINE: Preg Test, Ur: NEGATIVE

## 2011-03-08 LAB — URINALYSIS, ROUTINE W REFLEX MICROSCOPIC
Bilirubin Urine: NEGATIVE
Glucose, UA: NEGATIVE mg/dL
Ketones, ur: 15 mg/dL — AB
Leukocytes, UA: NEGATIVE
Nitrite: NEGATIVE
Protein, ur: NEGATIVE mg/dL
Specific Gravity, Urine: 1.024 (ref 1.005–1.030)
Urobilinogen, UA: 0.2 mg/dL (ref 0.0–1.0)
pH: 5.5 (ref 5.0–8.0)

## 2011-03-08 LAB — CBC
HCT: 42.3 % (ref 36.0–46.0)
Hemoglobin: 14.4 g/dL (ref 12.0–15.0)
MCHC: 34.1 g/dL (ref 30.0–36.0)
MCV: 87.4 fL (ref 78.0–100.0)
Platelets: 296 10*3/uL (ref 150–400)
RBC: 4.84 MIL/uL (ref 3.87–5.11)
RDW: 14.6 % (ref 11.5–15.5)
WBC: 11 10*3/uL — ABNORMAL HIGH (ref 4.0–10.5)

## 2011-03-08 LAB — COMPREHENSIVE METABOLIC PANEL
ALT: 48 U/L — ABNORMAL HIGH (ref 0–35)
AST: 34 U/L (ref 0–37)
Albumin: 4.2 g/dL (ref 3.5–5.2)
Alkaline Phosphatase: 72 U/L (ref 39–117)
BUN: 8 mg/dL (ref 6–23)
CO2: 27 mEq/L (ref 19–32)
Calcium: 9 mg/dL (ref 8.4–10.5)
Chloride: 105 mEq/L (ref 96–112)
Creatinine, Ser: 0.58 mg/dL (ref 0.4–1.2)
GFR calc Af Amer: >60 mL/min (ref >60)
GFR calc non Af Amer: >60 mL/min (ref >60)
Glucose, Bld: 142 mg/dL — ABNORMAL HIGH (ref 70–99)
Potassium: 3.4 mEq/L — ABNORMAL LOW (ref 3.5–5.1)
Sodium: 139 mEq/L (ref 135–145)
Total Bilirubin: 0.7 mg/dL (ref 0.3–1.2)
Total Protein: 8.5 g/dL — ABNORMAL HIGH (ref 6.0–8.3)

## 2011-03-08 LAB — DIFFERENTIAL
Basophils Absolute: 0 10*3/uL (ref 0.0–0.1)
Basophils Relative: 0 % (ref 0–1)
Eosinophils Absolute: 0.1 10*3/uL (ref 0.0–0.7)
Eosinophils Relative: 1 % (ref 0–5)
Lymphocytes Relative: 7 % — ABNORMAL LOW (ref 12–46)
Lymphs Abs: 0.7 10*3/uL (ref 0.7–4.0)
Monocytes Absolute: 0.3 10*3/uL (ref 0.1–1.0)
Monocytes Relative: 3 % (ref 3–12)
Neutro Abs: 9.9 10*3/uL — ABNORMAL HIGH (ref 1.7–7.7)
Neutrophils Relative %: 91 % — ABNORMAL HIGH (ref 43–77)

## 2011-03-08 LAB — LIPASE, BLOOD: Lipase: 16 U/L (ref 11–59)

## 2011-03-11 LAB — OLIGOCLONAL BANDS, CSF + SERM
Albumin Index: 4.3 ratio (ref 0.0–9.0)
Albumin, CSF: 18 mg/dL (ref 0–35)
Albumin, Serum(Neph): 4230 mg/dL (ref 3500–5200)
CSF Oligoclonal Bands: POSITIVE
IgG Index, CSF: 0.42 ratio (ref 0.28–0.66)
IgG, CSF: 2.9 mg/dL (ref 0.0–6.0)
IgG, Serum: 1620 mg/dL (ref 768–1632)
IgG/Albumin Ratio, CSF: 0.16 ratio (ref 0.09–0.25)
MS CNS IgG Synthesis Rate: <0 mg/d (ref <=8.0)

## 2011-03-11 LAB — GLUCOSE, CAPILLARY
Glucose-Capillary: 126 mg/dL — ABNORMAL HIGH (ref 70–99)
Glucose-Capillary: 127 mg/dL — ABNORMAL HIGH (ref 70–99)
Glucose-Capillary: 131 mg/dL — ABNORMAL HIGH (ref 70–99)
Glucose-Capillary: 143 mg/dL — ABNORMAL HIGH (ref 70–99)
Glucose-Capillary: 155 mg/dL — ABNORMAL HIGH (ref 70–99)
Glucose-Capillary: 164 mg/dL — ABNORMAL HIGH (ref 70–99)
Glucose-Capillary: 170 mg/dL — ABNORMAL HIGH (ref 70–99)
Glucose-Capillary: 195 mg/dL — ABNORMAL HIGH (ref 70–99)
Glucose-Capillary: 212 mg/dL — ABNORMAL HIGH (ref 70–99)
Glucose-Capillary: 229 mg/dL — ABNORMAL HIGH (ref 70–99)
Glucose-Capillary: 244 mg/dL — ABNORMAL HIGH (ref 70–99)

## 2011-03-11 LAB — BASIC METABOLIC PANEL
BUN: 13 mg/dL (ref 6–23)
BUN: 9 mg/dL (ref 6–23)
CO2: 23 mEq/L (ref 19–32)
CO2: 24 mEq/L (ref 19–32)
Calcium: 10.3 mg/dL (ref 8.4–10.5)
Calcium: 9.3 mg/dL (ref 8.4–10.5)
Chloride: 104 mEq/L (ref 96–112)
Chloride: 106 mEq/L (ref 96–112)
Creatinine, Ser: 0.58 mg/dL (ref 0.4–1.2)
Creatinine, Ser: 0.72 mg/dL (ref 0.4–1.2)
GFR calc Af Amer: >60 mL/min (ref >60)
GFR calc Af Amer: >60 mL/min (ref >60)
GFR calc non Af Amer: >60 mL/min (ref >60)
GFR calc non Af Amer: >60 mL/min (ref >60)
Glucose, Bld: 135 mg/dL — ABNORMAL HIGH (ref 70–99)
Glucose, Bld: 143 mg/dL — ABNORMAL HIGH (ref 70–99)
Potassium: 3.8 mEq/L (ref 3.5–5.1)
Potassium: 4.4 mEq/L (ref 3.5–5.1)
Sodium: 139 mEq/L (ref 135–145)
Sodium: 141 mEq/L (ref 135–145)

## 2011-03-11 LAB — COMPREHENSIVE METABOLIC PANEL
ALT: 23 U/L (ref 0–35)
AST: 18 U/L (ref 0–37)
Albumin: 4.1 g/dL (ref 3.5–5.2)
Alkaline Phosphatase: 61 U/L (ref 39–117)
BUN: 6 mg/dL (ref 6–23)
CO2: 27 mEq/L (ref 19–32)
Calcium: 9.6 mg/dL (ref 8.4–10.5)
Chloride: 105 mEq/L (ref 96–112)
Creatinine, Ser: 0.58 mg/dL (ref 0.4–1.2)
GFR calc Af Amer: >60 mL/min (ref >60)
GFR calc non Af Amer: >60 mL/min (ref >60)
Glucose, Bld: 144 mg/dL — ABNORMAL HIGH (ref 70–99)
Potassium: 3.2 mEq/L — ABNORMAL LOW (ref 3.5–5.1)
Sodium: 139 mEq/L (ref 135–145)
Total Bilirubin: 0.6 mg/dL (ref 0.3–1.2)
Total Protein: 8.2 g/dL (ref 6.0–8.3)

## 2011-03-11 LAB — ANA: Anti Nuclear Antibody(ANA): NEGATIVE

## 2011-03-11 LAB — DIFFERENTIAL
Basophils Absolute: 0 10*3/uL (ref 0.0–0.1)
Basophils Relative: 0 % (ref 0–1)
Eosinophils Absolute: 0.2 10*3/uL (ref 0.0–0.7)
Eosinophils Relative: 2 % (ref 0–5)
Lymphocytes Relative: 31 % (ref 12–46)
Lymphs Abs: 3.4 10*3/uL (ref 0.7–4.0)
Monocytes Absolute: 0.6 10*3/uL (ref 0.1–1.0)
Monocytes Relative: 6 % (ref 3–12)
Neutro Abs: 6.8 10*3/uL (ref 1.7–7.7)
Neutrophils Relative %: 62 % (ref 43–77)

## 2011-03-11 LAB — ANGIOTENSIN CONVERTING ENZYME, CSF: Angio Convert Enzyme: <3 U/L (ref <16)

## 2011-03-11 LAB — CBC
HCT: 38.8 % (ref 36.0–46.0)
Hemoglobin: 13.2 g/dL (ref 12.0–15.0)
MCHC: 34 g/dL (ref 30.0–36.0)
MCV: 86.8 fL (ref 78.0–100.0)
Platelets: 382 10*3/uL (ref 150–400)
RBC: 4.48 MIL/uL (ref 3.87–5.11)
RDW: 12.8 % (ref 11.5–15.5)
WBC: 11 10*3/uL — ABNORMAL HIGH (ref 4.0–10.5)

## 2011-03-11 LAB — HIV ANTIBODY (ROUTINE TESTING W REFLEX): HIV: NONREACTIVE

## 2011-03-11 LAB — LUPUS ANTICOAGULANT PANEL
DRVVT: 48.4 secs — ABNORMAL HIGH (ref 36.1–47.0)
Lupus Anticoagulant: NOT DETECTED
PTT Lupus Anticoagulant: 47.8 secs (ref 36.3–48.8)
dRVVT Incubated 1:1 Mix: 39 secs (ref 36.1–47.0)

## 2011-03-11 LAB — CARDIOLIPIN ANTIBODIES, IGG, IGM, IGA
Anticardiolipin IgA: 9 [APL'U] — ABNORMAL LOW (ref <13)
Anticardiolipin IgG: <7 [GPL'U] — ABNORMAL LOW (ref <11)
Anticardiolipin IgM: <7 [MPL'U] — ABNORMAL LOW (ref <10)

## 2011-03-11 LAB — CSF CELL COUNT WITH DIFFERENTIAL
RBC Count, CSF: 39 /mm3 — ABNORMAL HIGH
Tube #: 3
WBC, CSF: 8 /mm3 — ABNORMAL HIGH (ref 0–5)

## 2011-03-11 LAB — URINE MICROSCOPIC-ADD ON

## 2011-03-11 LAB — SJOGRENS SYNDROME-B EXTRACTABLE NUCLEAR ANTIBODY: SSB (La) (ENA) Antibody, IgG: <0.2 AI (ref <1.0)

## 2011-03-11 LAB — URINALYSIS, ROUTINE W REFLEX MICROSCOPIC
Bilirubin Urine: NEGATIVE
Glucose, UA: NEGATIVE mg/dL
Ketones, ur: NEGATIVE mg/dL
Leukocytes, UA: NEGATIVE
Nitrite: NEGATIVE
Protein, ur: NEGATIVE mg/dL
Specific Gravity, Urine: 1.021 (ref 1.005–1.030)
Urobilinogen, UA: 0.2 mg/dL (ref 0.0–1.0)
pH: 5.5 (ref 5.0–8.0)

## 2011-03-11 LAB — VITAMIN B12: Vitamin B-12: 459 pg/mL (ref 211–911)

## 2011-03-11 LAB — SEDIMENTATION RATE: Sed Rate: 43 mm/hr — ABNORMAL HIGH (ref 0–22)

## 2011-03-11 LAB — PROTEIN AND GLUCOSE, CSF
Glucose, CSF: 102 mg/dL — ABNORMAL HIGH (ref 43–76)
Total  Protein, CSF: 29 mg/dL (ref 15–45)

## 2011-03-11 LAB — CRYPTOCOCCAL ANTIGEN, CSF: Crypto Ag: NEGATIVE

## 2011-03-11 LAB — RHEUMATOID FACTOR: Rhuematoid fact SerPl-aCnc: 23 IU/mL — ABNORMAL HIGH (ref 0–20)

## 2011-03-11 LAB — B. BURGDORFI ANTIBODIES, CSF: Lyme Ab: 0.07 LIV

## 2011-03-11 LAB — VDRL, CSF: VDRL Quant, CSF: NONREACTIVE

## 2011-03-11 LAB — SJOGRENS SYNDROME-A EXTRACTABLE NUCLEAR ANTIBODY: SSA (Ro) (ENA) Antibody, IgG: <0.2 AI (ref <1.0)

## 2011-03-11 LAB — PREGNANCY, URINE: Preg Test, Ur: NEGATIVE

## 2011-03-12 LAB — URINALYSIS, ROUTINE W REFLEX MICROSCOPIC
Bilirubin Urine: NEGATIVE
Glucose, UA: NEGATIVE mg/dL
Ketones, ur: 40 mg/dL — AB
Leukocytes, UA: NEGATIVE
Nitrite: NEGATIVE
Protein, ur: NEGATIVE mg/dL
Specific Gravity, Urine: 1.028 (ref 1.005–1.030)
Urobilinogen, UA: 0.2 mg/dL (ref 0.0–1.0)
pH: 5.5 (ref 5.0–8.0)

## 2011-03-12 LAB — DIFFERENTIAL
Basophils Absolute: 0.1 10*3/uL (ref 0.0–0.1)
Basophils Relative: 0 % (ref 0–1)
Eosinophils Absolute: 0.2 10*3/uL (ref 0.0–0.7)
Eosinophils Relative: 1 % (ref 0–5)
Lymphocytes Relative: 16 % (ref 12–46)
Lymphs Abs: 2.6 10*3/uL (ref 0.7–4.0)
Monocytes Absolute: 0.5 10*3/uL (ref 0.1–1.0)
Monocytes Relative: 3 % (ref 3–12)
Neutro Abs: 13.4 10*3/uL — ABNORMAL HIGH (ref 1.7–7.7)
Neutrophils Relative %: 80 % — ABNORMAL HIGH (ref 43–77)

## 2011-03-12 LAB — CBC
HCT: 39.7 % (ref 36.0–46.0)
Hemoglobin: 13.2 g/dL (ref 12.0–15.0)
MCHC: 33.2 g/dL (ref 30.0–36.0)
MCV: 90.1 fL (ref 78.0–100.0)
Platelets: 404 10*3/uL — ABNORMAL HIGH (ref 150–400)
RBC: 4.4 MIL/uL (ref 3.87–5.11)
RDW: 11.7 % (ref 11.5–15.5)
WBC: 16.8 10*3/uL — ABNORMAL HIGH (ref 4.0–10.5)

## 2011-03-12 LAB — POCT I-STAT, CHEM 8
BUN: 7 mg/dL (ref 6–23)
Calcium, Ion: 1.18 mmol/L (ref 1.12–1.32)
Chloride: 105 mEq/L (ref 96–112)
Creatinine, Ser: 0.8 mg/dL (ref 0.4–1.2)
Glucose, Bld: 127 mg/dL — ABNORMAL HIGH (ref 70–99)
HCT: 40 % (ref 36.0–46.0)
Hemoglobin: 13.6 g/dL (ref 12.0–15.0)
Potassium: 3.7 mEq/L (ref 3.5–5.1)
Sodium: 140 mEq/L (ref 135–145)
TCO2: 22 mmol/L (ref 0–100)

## 2011-03-12 LAB — GC/CHLAMYDIA PROBE AMP, GENITAL
Chlamydia, DNA Probe: NEGATIVE
GC Probe Amp, Genital: NEGATIVE

## 2011-03-12 LAB — RPR: RPR Ser Ql: NONREACTIVE

## 2011-03-12 LAB — WET PREP, GENITAL
Clue Cells Wet Prep HPF POC: NONE SEEN
Trich, Wet Prep: NONE SEEN
Yeast Wet Prep HPF POC: NONE SEEN

## 2011-03-12 LAB — URINE MICROSCOPIC-ADD ON

## 2011-03-12 LAB — GLUCOSE, CAPILLARY: Glucose-Capillary: 123 mg/dL — ABNORMAL HIGH (ref 70–99)

## 2011-03-12 LAB — POCT PREGNANCY, URINE: Preg Test, Ur: NEGATIVE

## 2011-03-20 LAB — DIFFERENTIAL
Basophils Absolute: 0 10*3/uL (ref 0.0–0.1)
Basophils Absolute: 0.1 10*3/uL (ref 0.0–0.1)
Basophils Relative: 0 % (ref 0–1)
Basophils Relative: 1 % (ref 0–1)
Eosinophils Absolute: 0 10*3/uL (ref 0.0–0.7)
Eosinophils Absolute: 0.5 10*3/uL (ref 0.0–0.7)
Eosinophils Relative: 0 % (ref 0–5)
Eosinophils Relative: 4 % (ref 0–5)
Lymphocytes Relative: 26 % (ref 12–46)
Lymphocytes Relative: 33 % (ref 12–46)
Lymphs Abs: 3.3 10*3/uL (ref 0.7–4.0)
Lymphs Abs: 3.9 10*3/uL (ref 0.7–4.0)
Monocytes Absolute: 0.8 10*3/uL (ref 0.1–1.0)
Monocytes Absolute: 1 10*3/uL (ref 0.1–1.0)
Monocytes Relative: 7 % (ref 3–12)
Monocytes Relative: 8 % (ref 3–12)
Neutro Abs: 6.6 10*3/uL (ref 1.7–7.7)
Neutro Abs: 8.7 10*3/uL — ABNORMAL HIGH (ref 1.7–7.7)
Neutrophils Relative %: 56 % (ref 43–77)
Neutrophils Relative %: 67 % (ref 43–77)

## 2011-03-20 LAB — CBC
HCT: 37.2 % (ref 36.0–46.0)
HCT: 42.8 % (ref 36.0–46.0)
Hemoglobin: 12.8 g/dL (ref 12.0–15.0)
Hemoglobin: 14.5 g/dL (ref 12.0–15.0)
MCHC: 34 g/dL (ref 30.0–36.0)
MCHC: 34.3 g/dL (ref 30.0–36.0)
MCV: 92.1 fL (ref 78.0–100.0)
MCV: 93.3 fL (ref 78.0–100.0)
Platelets: 322 10*3/uL (ref 150–400)
Platelets: 359 10*3/uL (ref 150–400)
RBC: 3.99 MIL/uL (ref 3.87–5.11)
RBC: 4.65 MIL/uL (ref 3.87–5.11)
RDW: 12.6 % (ref 11.5–15.5)
RDW: 13.1 % (ref 11.5–15.5)
WBC: 11.9 10*3/uL — ABNORMAL HIGH (ref 4.0–10.5)
WBC: 13 10*3/uL — ABNORMAL HIGH (ref 4.0–10.5)

## 2011-03-20 LAB — PREGNANCY, URINE: Preg Test, Ur: NEGATIVE

## 2011-03-20 LAB — COMPREHENSIVE METABOLIC PANEL
ALT: 49 U/L — ABNORMAL HIGH (ref 0–35)
AST: 29 U/L (ref 0–37)
Albumin: 3.9 g/dL (ref 3.5–5.2)
Alkaline Phosphatase: 76 U/L (ref 39–117)
BUN: 11 mg/dL (ref 6–23)
CO2: 29 mEq/L (ref 19–32)
Calcium: 10 mg/dL (ref 8.4–10.5)
Chloride: 99 mEq/L (ref 96–112)
Creatinine, Ser: 0.52 mg/dL (ref 0.4–1.2)
GFR calc Af Amer: >60 mL/min (ref >60)
GFR calc non Af Amer: >60 mL/min (ref >60)
Glucose, Bld: 182 mg/dL — ABNORMAL HIGH (ref 70–99)
Potassium: 4 mEq/L (ref 3.5–5.1)
Sodium: 136 mEq/L (ref 135–145)
Total Bilirubin: 0.7 mg/dL (ref 0.3–1.2)
Total Protein: 7.6 g/dL (ref 6.0–8.3)

## 2011-03-20 LAB — GLUCOSE, CAPILLARY
Glucose-Capillary: 135 mg/dL — ABNORMAL HIGH (ref 70–99)
Glucose-Capillary: 139 mg/dL — ABNORMAL HIGH (ref 70–99)
Glucose-Capillary: 140 mg/dL — ABNORMAL HIGH (ref 70–99)
Glucose-Capillary: 155 mg/dL — ABNORMAL HIGH (ref 70–99)
Glucose-Capillary: 158 mg/dL — ABNORMAL HIGH (ref 70–99)
Glucose-Capillary: 179 mg/dL — ABNORMAL HIGH (ref 70–99)
Glucose-Capillary: 188 mg/dL — ABNORMAL HIGH (ref 70–99)
Glucose-Capillary: 213 mg/dL — ABNORMAL HIGH (ref 70–99)

## 2011-04-17 NOTE — H&P (Signed)
[name], [name]                 ACCOUNT NO.:  [id number]   MEDICAL RECORD NO.:  [id number]          PATIENT TYPE:  INP   LOCATION:  3034                         FACILITY:  [facility]   PHYSICIAN:  Marlan Palau, M.D.  DATE OF BIRTH:  [birth year]   DATE OF ADMISSION:  06/10/2009  DATE OF DISCHARGE:                              HISTORY & PHYSICAL   HISTORY OF PRESENT ILLNESS:  [name] is a 35 year old right-handed  black female born 1976/09/21 with a history of morbid obesity  and a lap band placement.  This patient awoke around 3 a.m. this morning  having noted problems with numbness and tingling sensations from the  genital area down both legs right equal to the left.  The patient had  noted that she had been incontinent of bowel and bladder and could not  feel the urge to have a bowel movement or to urinate.  The patient was  able to walk a little bit but was staggering could not feel her feet  well.  Since the onset, the patient has not had any improvement but is  not worsened either.  The patient had noted some tingling sensation in  both feet the day prior and some swelling in both feet.  This patient  has undergone an MRI study of the lumbosacral and thoracic spines and  there is evidence of spinal cord lesions at the thoracic level 3 and in  the conus medullaris.  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   PAST MEDICAL HISTORY:  1. 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  2. Obesity.  3. Lap band placement.  4. Diabetes.  5. C-section.   The patient was on birth control pills with Depo Provera injections  every 3 months and is on metformin solution 500 mg twice daily.  The  patient does not smoke or drink or use drugs such as cocaine or  marijuana, has no known allergies.   SOCIAL HISTORY:  This  patient is single and is living in the [place],  [place] area, has a Museum/gallery conservator and a daughter around age 34.  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   FAMILY MEDICAL HISTORY:  Notable for problem with DVT, stroke, diabetes  in the mother.  Mother is living.  Father is alive and well.  The  patient has one brother who is alive and well.  There is a history of  cancer on both sides of the family.   REVIEW OF SYSTEMS:  Notable for no recent fevers, chills.  The patient  denies headache, neck pain, shortness of breath, chest pains, abdominal  pain.  Does have some numbness in the genital area in the lower abdomen  and down both legs.  The patient has no dizziness, loss of  consciousness, and history of seizures.  No numbness of the arms is  noted.   PHYSICAL EXAMINATION:  VITALS:  Blood pressure is 120/77, heart rate 88,  respiratory rate 20, temperature afebrile.  GENERAL:  The patient is a markedly obese black female who is alert,  cooperative at the time of examination.  HEENT:  Head is atraumatic.  Eyes: Pupils are equal, round, and reactive  to light.  Disks are flat bilaterally.  NECK:  Supple.  No carotid bruits.  RESPIRATORY:  Clear.  CARDIOVASCULAR:  Regular rate and rhythm.  No obvious murmurs, rubs are  noted.  ABDOMEN:  Significant obese abdomen.  No organomegaly or tenderness  noted.  EXTREMITIES:  No significant edema, 1+ edema is noted in both ankles.  NEUROLOGIC:  Cranial nerves as above.  Facial symmetry is present.  The  patient has good sensation of the face to pinprick and soft touch  bilaterally.  She has good strength in facial muscles and muscle of the  head turn and shoulder shrug bilaterally.  Again the strength in the  facial muscles is normal.  Extraocular movements were normal.  Visual  fields were full.  Motor test reveals good strength in all fours, good  symmetric motor tone is noted throughout.  The sensory testing  reveals a  pinprick deficit up to the thoracic level 11 or 12.  This is present on  both sides.  The patient has some minimal impairment of vibratory  sensation in both feet, normal in the arms.  The position sense is  intact in all fours.  The patient has good finger-nose-finger and heel-  to-shin bilaterally.  Gait was not tested.  Deep tendon reflexes are  depressed but symmetric.  The toes are neutral bilaterally.   LABORATORY VALUES:  Notable for sodium 139, potassium 3.8, chloride of  106, CO2 24, glucose of 143, BUN of 9, creatinine 0.58, calcium 9.3.  Urinalysis reveals specific gravity of 1.021, pH of 5.5, 0-2 white  cells, 7-10 red cells.  White count of 11.0, hemoglobin of 13.2,  hematocrit 38.3, MCV of 86.8, and platelets of 382.   MRI studies were as above.   IMPRESSION:  1. Transverse myelitis.  2. Morbid obesity.  3. Diabetes.   This patient has evidence of two separate spinal cord lesions by MRI,  one at the T3 level and one in the conus medullaris.  The conus  medullaris lesion is likely a symptomatic event.  This patient may very  well have multiple sclerosis and due to problems with gait imbalance and  bowel and bladder incontinence, the patient will be brought in for high-  dose methylprednisolone treatment.   PLAN:  1. Admission to [facility].  2. MRI of the brain.  3. MRI of the cervical spine.  The MRIs studies will be done with and      without gadolinium.  4. Lumbar puncture.  5. Blood work.  6. Physical therapy evaluation.  We will follow the patient's clinical      course while in-house.      Marlan Palau, M.D.  Electronically Signed     CKW/MEDQ  D:  06/10/2009  T:  06/11/2009  Job:  [id number]   cc:   Barbette Hair. Artist Pais, DO  [facility]

## 2011-04-17 NOTE — Assessment & Plan Note (Signed)
HEALTHCARE                         GASTROENTEROLOGY OFFICE NOTE   [name], [name]                          MRN:          [id number]  DATE:02/09/2008                            DOB:          [birth year]    REFERRING PHYSICIAN:  Barbette Hair. Artist Pais, DO   REASON FOR CONSULTATION:  Abnormal liver function studies, fatigue,  weight gain.   HISTORY:  This is a 35 year old female with morbid obesity,  hypertension, type 2 diabetes, hyperlipidemia, asthma, and depression.  She also has longstanding GI complaints felt to represent irritable  bowel syndrome for which she has previously seen Althea Grimmer. [name],  M.D.  She is status post cholecystectomy.  The patient reports to me  chronic abnormalities of her hepatic transaminases which have been  observed over time.  Testing for celiac sprue has been negative both by  antibody studies and biopsies.  Liver tests have been elevated,  generally 1-1/2 to 2 times the upper limit of normal, with ALT being  greater than AST.  I see abnormalities in her chart dating back, at  least, to 2004.  Since that time she has gained about 50 pounds.  More  recent liver tests were elevated with an AST of 117 and an ALT of 167.  Her alkaline phosphatase and bilirubin have been normal.  Her albumin,  total protein, and globulins have also been normal as has her CBC.  Her  hemoglobin A1C is 6.9, total cholesterol 203.2.  Her workup was expanded  to include chronic hepatitis serologies which were negative.  Also,  alpha I antitrypsin, ceruloplasmin, and antinuclear antibody were normal  or negative.  Iron studies and ferritin were also unremarkable.  Ultrasound of the abdomen obtained on January 27, 2008, revealed  changes consistent with diffuse fatty infiltration of the liver.  The  patient tells me she is quite frustrated in that she continues to gain  weight despite exercising and  dieting.  She tells me that she is  motivated to  loose weight.  Her biggest complaint is that of extreme  fatigue.  Her thyroid testing is normal.   PAST MEDICAL HISTORY:  As above.   PAST SURGICAL HISTORY:  Cholecystectomy.   ALLERGIES:  ACTOS causes edema.   CURRENT MEDICATIONS:  1. Metformin 500 mg b.i.d.  2. Byetta 5 mg b.i.d.  3. Diovan 80 mg daily.   FAMILY HISTORY:  She reports paternal grandfather and three uncles with  advanced liver disease.  She could not expand further.  She states that  they were not users of alcohol to any significant degree.  Mother has  diabetes.  No history of gastrointestinal malignancy.   SOCIAL HISTORY:  The patient is divorced with two children.  She lives  with her fiance and children.  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  She does not smoke.  She consumes one or two  alcoholic beverages (margarita) per month.   REVIEW OF SYSTEMS:  Per diagnostic evaluation form.   PHYSICAL EXAMINATION:  GENERAL:  The patient is pleasant, alert and  oriented.  She is in no acute distress.  VITAL SIGNS:  Blood pressure 116/74, heart rate 84, weight 270 pounds,  height 5 feet 3 inches.  HEENT:  Sclerae anicteric, conjunctivae pink.  Oral mucosa is intact.  Tongue hue is normal.  NECK:  Thick with no obvious adenopathy.  Thyroid appears normal.  LUNGS:  Clear to auscultation and percussion.  HEART:  Regular.  ABDOMEN:  Markedly obese without tenderness or obvious mass.  Difficult  to percuss the liver.  No obvious splenomegaly.  No abnormal venous  pattern on the abdominal wall.  EXTREMITIES:  Without edema.  NEUROLOGY:  No evidence of asterixis.  Deep tendon reflexes are normal.   IMPRESSION:  This is a 35 year old female with metabolic syndrome and  progressive weight gain.  She presents now for chronic mild to moderate  elevation of hepatic transaminases.  She most likely has fatty liver  disease.  I am concerned that she may have nonalcoholic steatohepatitis  (NASH).  We  discussed clinical the significance of fatty liver disease,  including cirrhosis.  In terms of her weight issue, I asked her if she  had considered bariatric surgery.  She said not, but would be interested  in learning more.  I also discussed with her  apossible evaluation with  Dr. Clair Gulling at [facility] as she may qualify for a study for patients  suspected of or being found to have nonalcoholic steatohepatitis.  She  was quite interested.   RECOMMENDATIONS:  1. Referral to [facility] for      education. Seminar date provided.  2. Refer to Dr. Ardine Eng at [facility] regarding possible NASH.  3. Resume general medical care with Dr. Artist Pais.     Wilhemina Bonito. Marina Goodell, MD  Electronically Signed    JNP/MedQ  DD: 02/09/2008  DT: 02/10/2008  Job #: [id number]   cc:   Barbette Hair. Artist Pais, DO

## 2011-04-17 NOTE — Discharge Summary (Signed)
[name], [name]                 ACCOUNT NO.:  [id number]   MEDICAL RECORD NO.:  [id number]          PATIENT TYPE:  INP   LOCATION:  3034                         FACILITY:  [facility]   PHYSICIAN:  Marlan Palau, M.D.  DATE OF BIRTH:  05/21/1976   DATE OF ADMISSION:  06/10/2009  DATE OF DISCHARGE:  06/13/2009                               DISCHARGE SUMMARY   ADMISSION DIAGNOSES:  1. Transverse myelitis with bowel and bladder control issues.  2. Obesity.  3. Diabetes.   DISCHARGE DIAGNOSES:  1. 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  2. Diabetes.  3. Obesity.   PROCEDURES DURING THIS ADMISSION:  1. An MRI of the lumbosacral and thoracic spine.  2. MRI of the brain.  3. MRI of the cervical spine.  4. Lumbar puncture.   COMPLICATION DURING ABOVE PROCEDURES:  None.   HISTORY OF PRESENT ILLNESS:  [name] is a 35 year old right-handed  Hispanic female, born on 02-23-1976, with a history of morbid  obesity and a lap band placement.  This patient began noting problems  with numbness and tingling sensations in the genital area down both  legs, right equal to left, first noted at 3 a.m. on the day of  admission.  The patient was incontinent of bowel and bladder and could  not feel the urge to have a bowel movement or to urinate.  The patient  was noted to have some numb tingling sensations in the feet and legs and  had difficulty walking with some staggering problems.  The patient  underwent an MRI study of the lumbosacral and thoracic spines through  the emergency room showing evidence of the thoracic level 3 spinal cord  lesion as well as a lesion in the conus medullaris which is felt to be  the symptomatic lesion.  The patient was seen by Neurology for further  evaluation.   PAST MEDICAL HISTORY:  Significant for:  1. New onset of transverse myelitis with bowel and bladder      incontinence.  2.  Obesity.  3. Lab band placement.  4. Diabetes.  5. C-section.   The patient was on birth control pills with Depo-Provera injections  every 3 months and on metformin solution 500 mg twice daily.  The  patient does not smoke or drink.  She has no known allergies.  Please  refer to the history and physical dictation summary for social history,  family history, review of systems, and physical examination.   LABORATORY VALUES:  At this time are notable for a sodium of 139,  potassium 3.2, chloride of 105, CO2 of 27, glucose of 144, BUN of 6,  creatinine of 0.58, alk phosphatase of 61, SGOT of 18, SGPT of 23, total  protein 8.2, albumin 4.1, and calcium 9.6.  Vitamin B12 level 459.  HIV  titer is negative.  White count was 11.0, hemoglobin 13.2, hematocrit  38.8, MCV of 86.8, and platelets of 382.  Sed rate was 43.  Urinalysis  reveals specific gravity of 1.021, pH  of 5.5, 7-10 red cells, 0-2 white  cells.  Rheumatoid factor slightly elevated at 23.  Cryptococcal antigen  on the spinal fluid was negative.  Spinal fluid analysis reveals a white  count of 8, red blood count of 39, glucose of 102, protein is normal at  29.  Oligoclonal banding is pending.   HOSPITAL COURSE:  The patient did fairly well during the course of  hospitalization.  The patient was set up for an MRI study of the brain  and cervical spine.  MRI of the brain and neck was done with and without  contrast.  There was a small periventricular white matter lesion on the  left and possibly 1 involving the corpus callosum posteriorly.  Cervical  spine showed some disk protrusion at C4-5, C5-6, and C6-7 levels without  spinal cord compression or cervical spinal cord lesions noted.  Again,  the patient was set up for lumbar puncture and the results are pending  at this time.  The patient has been seen by Physical Therapy and has  been ambulating without assistance.  The patient has had some postvoid  residuals in the 100-113 mL  range, but has been treated with 3 days of  Solu-Medrol 500 mg daily.  She has had some improvement in her bowel and  bladder function.  The patient does report some constipation, but has  noted that the numbness in her feet is converted more to a tingling  sensation now and the patient has some discomfort in the feet.  The  patient will be discharged to home with outpatient physical therapy and  will have a prednisone Dosepak probably 10 mg 12-day pack.  The patient  will be kept out of work for at least 2 weeks.  The patient will follow  up in our office in 2-3 weeks for reevaluation and we will consider  initiation of treatment for MS at that point.  The patient will need to  follow her blood sugars closely.  At the time of discharge, the patient  is bright, alert, cooperative and is fully ambulatory.  The patient has  minimally wide-based gait, was able to walk independently.  Good  strength is noted to direct testing on all 4 extremities.      Marlan Palau, M.D.  Electronically Signed     CKW/MEDQ  D:  06/13/2009  T:  06/13/2009  Job:  [id number]   cc:   Barbette Hair. Artist Pais, DO  [facility]

## 2011-04-17 NOTE — Procedures (Signed)
[name], [name]                 ACCOUNT NO.:  [id number]   MEDICAL RECORD NO.:  [id number]          PATIENT TYPE:  OUT   LOCATION:  SLEEP CENTER                 [facility]ACILITY[facility]  [facility]   PHYSICIAN:  Barbaraann Share, MD,FCCPDATE OF BIRTH:  1976/06/23   DATE OF STUDY:  04/07/2008                            NOCTURNAL POLYSOMNOGRAM   REFERRING PHYSICIAN:   INDICATION FOR STUDY:  Hypersomnia with sleep apnea.   EPWORTH SLEEPINESS SCORE:  Five.   SLEEP ARCHITECTURE:  The patient had a total sleep time of 381 minutes  with decreased slow-wave sleep as well as REM.  Sleep onset latency was  prolonged at 43 minutes, and REM onset was normal at 66 minutes.  Sleep  efficiency was decreased mildly at 88%.   RESPIRATORY DATA:  The patient was found to have 32 apneas and 58  hypopneas for an apnea/hypopnea index of 14 events per hour.  The events  were not positional, but they were clearly worse during REM.  There was  loud snoring noted throughout.  The patient did not need split night  protocol secondary to most of the events occurring after midnight.   OXYGEN DATA:  There was O2 desaturation as low as 66% with her  obstructive events during REM.   CARDIAC DATA:  No clinically significant arrhythmias were noted.   MOVEMENT/PARASOMNIA:  There were no leg jerks or abnormal behaviors  noted.   IMPRESSION/RECOMMENDATION:  Mild obstructive sleep apnea/hypopnea  syndrome with an apnea/hypopnea index of 14 events per hour and O2  desaturation as low as 66%.  The patient did not meet split night  criteria secondary to the majority of her events occurring after  midnight.  Treatment for this degree of sleep apnea can include weight  loss alone if applicable, upper airway surgery, oral appliance, and also  continuous positive airway pressure.  Clinical correlation is suggested.      Barbaraann Share, MD,FCCP  Diplomate, American Board of Sleep  Medicine  Electronically Signed    KMC/MEDQ  D:   04/22/2008 16:10:96  T:  04/22/2008 04:54:09  Job:  [id number]

## 2011-04-17 NOTE — Op Note (Signed)
NAMETERENCE, GOOGE                 ACCOUNT NO.:  [id number]   MEDICAL RECORD NO.:  [id number]          PATIENT TYPE:  OIB   LOCATION:  1528                         FACILITY:  [facility]   PHYSICIAN:  Sandria Bales. Ezzard Standing, M.D.  DATE OF BIRTH:  12-Jul-1976   DATE OF PROCEDURE:  01/25/2009  DATE OF DISCHARGE:                               OPERATIVE REPORT   Date of Surgery ?   PREOPERATIVE DIAGNOSIS:  Morbid obesity (weight 263, body mass index  46.6).   POSTOPERATIVE DIAGNOSIS:  Morbid obesity, (weight 263, body mass index  46.6).   PROCEDURE:  Lap band, AP large, subcutaneous port on polypropylene mesh.   SURGEON:  Dr. Ezzard Standing.   FIRST ASSISTANT:  Dr. Jaclynn Guarneri.   ANESTHESIA:  General endotracheal.   ESTIMATED BLOOD LOSS:  Minimal.   INDICATIONS FOR PROCEDURE:  Ms. Rob Hickman is a 35 year old female who is a  patient of Dr. Thomos Lemons, who has been morbidly obese much of her adult  life and went through our preoperative bariatric program.  She is  interested in proceeding with a lap band.   I discussed with her the indications and potential complications of lap  band placement.  The potential complications include, but are not  limited to, bleeding, infection, perforation of the bowel, slippage or  erosion of the band and open surgery.   OPERATIVE NOTE:  The patient was placed in a supine position.  Her  abdomen was prepped with CHG.  She was given Ancef at the initiation of  the procedure and had PAS stockings in place.  She was sterilely draped  and a timeout was held identifying the patient and the procedure.   The abdomen was accessed through the left upper quadrant using the 11 mm  Optiview Ethicon trocar.  I then placed four additional trocars; a 5 mm  subxiphoid trocar for the [facility] liver retractor, a 15 mm right upper  quadrant trocar, an 11 mm right paramedian trocar and a 11 mm left  paramedian trocar.   The liver retractor was placed under the left lobe of the liver.  I  did  do a general exploration.  She did have fatty changes of her liver  visible on laparoscopy.  The anterior wall of her stomach was  unremarkable.  She did have adhesions in the midline which were below  our trocar placement, so I left these adhesions alone.   I then went up and dissected along the left side of the esophagogastric  junction at the angle of His and found the left crus.  I then went  through the gastrohepatic ligament and found the right crus and passed  the finger dissector behind the gastroesophageal junction.   I then placed the sizing balloon and placed into the stomach.  I blew up  15 mL of air and pulled this back and there was no evidence of hiatal  hernia, and she had no evidence of hiatal hernia on her preop films.   I then passed the sizing tubing using the finger around the band.  Using  the sizing tube,  I cinched down an AP large over the sizing tube and  this closed easily.   I then imbricated the stomach over the lateral aspect of the band used  three sutures of 2-0 Ethibond suture using tie knots to cinch this down.   There was a little bit of bleeding with the last suture, but this  bleeding stopped prior to removing the scope.  I then re-inspected the  band.  It looked like it would rotated easily.  There was no impingement  on the band buckle and the band lay in good position.   The trocars were then removed in turn.  There was no bleeding at any  trocar site.  Because she has a thick abdominal wall, I decided to place  the band subcutaneously.  I sewed a piece of polypropylene mesh in the  back of the port using four interrupted 2-0 Prolene sutures.  I then  made a pocket lateral to the incision and put the band at this place.   I then closed the subcutaneous tissues with 3-0 Vicryl suture, the skin  with a 5-0 Vicryl suture, painted the wound with a tincture of benzoin  and steri-stripped.  The patient tolerated the procedure well and was   transported to the recovery room in good condition.  Sponge and needle  count were correct at the end of the case.      Sandria Bales. Ezzard Standing, M.D.  Electronically Signed     DHN/MEDQ  D:  01/25/2009  T:  01/25/2009  Job:  [id number]   cc:   Barbette Hair. Artist Pais, DO  [address] [place], [place] [address]   Wilhemina Bonito. Marina Goodell, MD  [address]  [place]  [place] [address]   Genia Del, M.D.  Fax: [phone]

## 2011-04-17 NOTE — Assessment & Plan Note (Signed)
[facility]                         GASTROENTEROLOGY OFFICE NOTE   [name], [name]                          MRN:          [id number]  DATE:04/06/2008                            DOB:          [birth year]    [name] schedules herself today for office followup.  She was initially  evaluated on February 09, 2008 regarding abnormal liver function studies.  She is felt to have fatty liver disease.  She had great concerns over  her progressive weight gain and metabolic syndrome.  With regards to her  liver disease, I referred her to a specialist at [facility].  She tells me  that she has called their office multiple times but has not received a  call back.She was encouraged to persue the evaluation.  In terms of her  progressive weight gain, I did suggest consideration of bariatric  surgery in light of the fact that multiple other attempts on her part to  lose weight have been unsuccessful.  She tells me that she did attend a  seminar and is interested in proceeding.  Her biggest issue today is  obtaining a letter stating that the procedure is for medical necessity.  Apparently, she discussed this with Dr. Artist Pais, who wished to try other  therapies first.  The patient tells me that after a month of exercise  and diet, she has gained 1 pound.  She really wants to proceed with  bariatric surgery and feels she understands the risk-benefit profile  well.  Really no active GI complaints or other GI issues at this time.   PHYSICAL EXAMINATION:  Her limited physical exam today finds a blood  pressure of 120/92, heart rate 100, weight 269.2 pounds.   Her only medications now are Lantus insulin and Diovan.   The abdomen was not re-examined.   IMPRESSION:  1. Elevated liver function tests likely due to fatty liver disease.      Referred to hepatologist with expertise in NAFL.  2. Morbid obesity.  3. Metabolic syndrome.   RECOMMENDATIONS:  From a GI standpoint, it seems quite  reasonable for  her to seriously consider  bariatric surgery.  In terms of a formal  letter of medical necessity, it would be more appropriate coming from  her primary care physician, as he manages her hypertension, dibetes,  etc..  To this end, she will discuss further with Dr. Artist Pais.     Wilhemina Bonito. Marina Goodell, MD  Electronically Signed    JNP/MedQ  DD: 04/06/2008  DT: 04/06/2008  Job #: [id number]   cc:   Barbette Hair. Artist Pais, DO

## 2011-04-20 NOTE — Discharge Summary (Signed)
[name], [name]                 ACCOUNT NO.:  [id number]   MEDICAL RECORD NO.:  [id number]          PATIENT TYPE:  INP   LOCATION:  5727                         FACILITY:  [facility]   PHYSICIAN:  Abigail Miyamoto, M.D. DATE OF BIRTH:  October 12, 1976   DATE OF ADMISSION:  08/15/2004  DATE OF DISCHARGE:  08/17/2004                                 DISCHARGE SUMMARY   DISCHARGE DIAGNOSES:  1.  Symptomatic cholelithiasis status post laparoscopic cholecystectomy with      intraoperative cholangiogram.  2.  Asthma.   HISTORY:  [name] is a 35 year old female with symptomatic  cholelithiasis and history of asthma and is being admitted for laparoscopic  cholecystectomy.   HOSPITAL COURSE:  The patient was admitted on August 15, 2004, and taken  to the operating room where she underwent a laparoscopic cholecystectomy  with intraoperative cholangiogram.  Postoperative day #1, she was doing well  from a surgical standpoint regarding her abdomen, but she was having  increased wheezing bilaterally with some shortness of breath.  She was  saturating 99% on room air.  Her inhalers were increased, and her discharge  was held to improve her respiratory status.  She was still having some  shortness of breath, but was feeling better.  She was saturating 97% on room  air.  We did check and ABG which was normal.  As she was improving from a  respiratory standpoint, decision was made to discharge the patient home.   DISCHARGE DIAGNOSES:  1.  Symptomatic cholelithiasis.  2.  Asthma.   DISCHARGE MEDICATIONS:  1.  Resume her home medications and her nebulizers.  2.  Vicodin or Advil for pain.   DISCHARGE INSTRUCTIONS:  No heavy lifting greater than 20 pounds for three  weeks.   FOLLOWUP:  She will follow up in my office in three weeks.  She will follow  up with primary care physician for her breathing as well.       DB/MEDQ  D:  09/12/2004  T:  09/12/2004  Job:  [id number]

## 2011-04-20 NOTE — Op Note (Signed)
[name], [name]                 ACCOUNT NO.:  [id number]   MEDICAL RECORD NO.:  [id number]          PATIENT TYPE:  AMB   LOCATION:  SDC                           FACILITY:  [facility]   PHYSICIAN:  Genia Del, M.D.DATE OF BIRTH:  September 26, 1976   DATE OF PROCEDURE:  05/21/2005  DATE OF DISCHARGE:                                 OPERATIVE REPORT   PREOPERATIVE DIAGNOSIS:  Persistent menorrhagia with thickened, irregular  endometrium.   POSTOPERATIVE DIAGNOSIS:  Persistent menorrhagia with thickened, irregular  endometrium, probable endometrial polyp.   OPERATION/PROCEDURE:  Hysteroscopy with resection of polyp with dilatation  and curettage.   SURGEON:  Genia Del, M.D.   ANESTHESIOLOGIST:  Belva Agee, M.D.   DESCRIPTION OF PROCEDURE:  Under spinal anesthesia, the patient is the  lithotomy position.  She is prepped with Betadine on the suprapubic vulvar  and vaginal areas.  The bladder is catheterized and the patient is draped as  usual.  The vaginal exam reveals an anteverted uterus, normal volume,  mobile, no adnexal masses.  The speculum is introduced into the vagina.  The  anterior lip of the cervix grasped with a tenaculum.  The patient took  misoprostol for cervical ripening.  Dilatation is done with Hegar dilators  up to #27 without difficulty.  We then introduced the diagnostic  hysteroscope in the intrauterine cavity.  Inspection of the intrauterine  cavity is done.  Pictures are taken.  Both ostia are well seen.  The cavity  appears normal except for two small polyps and the sterile wall.  We removed  the hysteroscope.  A curettage of the endometrial cavity is done with a  sharp curet systemically on all surfaces.  The specimen is sent to  pathology.  We go back with the hysteroscope in the intrauterine cavity and  the two small polyps are not there anymore.  The intrauterine cavity appears  normal and regular.  Pictures are taken before and after  intervention.  Hemostasis is adequate.  All instruments are removed. We complete hemostasis  on the anterior lip of the cervix with silver nitrate at the site of the  tenaculum.  The estimated blood loss was minimal.  The fluid deficit was 50  mL.  No complications occurred and the patient was brought to recovery room  in good status.       ML/MEDQ  D:  05/21/2005  T:  05/22/2005  Job:  [id number]

## 2011-04-20 NOTE — Op Note (Signed)
NAMEJARELIS, EHLERT                             ACCOUNT NO.:  [id number]   MEDICAL RECORD NO.:  [id number]                   PATIENT TYPE:  OIB   LOCATION:  2550                                 FACILITY:  [facility]   PHYSICIAN:  Abigail Miyamoto, M.D.              DATE OF BIRTH:  10-26-1976   DATE OF PROCEDURE:  08/15/2004  DATE OF DISCHARGE:                                 OPERATIVE REPORT   PREOPERATIVE DIAGNOSIS:  Symptomatic cholelithiasis.   POSTOPERATIVE DIAGNOSIS:  Symptomatic cholelithiasis.   OPERATION PERFORMED:  Laparoscopic cholecystectomy with intraoperative  cholangiogram.   SURGEON:  Douglas A. Magnus Ivan, M.D.   ASSISTANT:  Anselm Pancoast. Zachery Dakins, M.D.   ANESTHESIA:  General endotracheal.   ESTIMATED BLOOD LOSS:  Minimal.   OPERATIVE FINDINGS:  The patient was found to have a normal cholangiogram.   DESCRIPTION OF PROCEDURE:  The patient was brought to the operating room and  identified as Mila Palmer.  She was placed supine on the operating table and  general anesthesia was induced.  Her abdomen was then prepped and draped in  the usual sterile fashion.  Using a #15 blade, a small vertical incision was  made above the umbilicus.  The incision was carried down to the fascia which  was then opened with a scalpel.  A hemostat was used to pass through the  peritoneal cavity.  A 0 Vicryl pursestring suture was then placed around the  fascial opening.  The Hasson port was placed through the opening and  insufflation of the abdomen was begun.  A 12 mm port was then placed in  the  patient's epigastrium and two 5 mm ports were placed in the patient's right  flank under direct vision.  The gallbladder was then grasped and retracted  above the liver bed.  Dissection was then carried out to the base of the  base of the gallbladder.  The cystic duct was easily dissected out and a  cortical window was achieved around it.  It was then clipped once distally  and partly opened with  laparoscopic scissors.  An Angiocatheter was then  inserted in the right upper quadrant under direct vision.  The  cholangiocatheter was passed through this and placed into the opening in the  cystic duct. A cholangiogram was then performed under direct fluoroscopy.  Contrast was seen to flow in the entire biliary system and duodenum without  evidence of abnormality or obstruction.  The cholangiocatheter was then  removed.  The cystic duct was then clipped three times proximally and  completely transected.  The cystic artery was then identified and  clipped  twice proximally and once distally and transected as well.  The gallbladder  was then slowly dissected free from the liver bed with electrocautery. Once  it was free from the liver bed, it was placed in an EndoSac and removed  through the incision at the umbilicus.  The 0 Vicryl at the umbilicus was  tied in place, closing the fascial defect.  The liver bed was again examined  and hemostasis felt to be achieved.  All ports were removed under direct  vision and the abdomen was deflated.  All incisions were then anesthetized  with 0.25% Marcaine and closed with 4-0 Vicryl subcuticular sutures.  Steri-  Strips, gauze and tape  were then applied.  The patient tolerated the procedure well.  All sponge,  needle and instrument counts were correct at the end of the procedure.  The  patient was then extubated in the operating room and taken in stable  condition to the recovery room.                                               Abigail Miyamoto, M.D.    DB/MEDQ  D:  08/15/2004  T:  08/15/2004  Job:  [id number]

## 2011-05-18 ENCOUNTER — Encounter (HOSPITAL_COMMUNITY): Payer: BC Managed Care – PPO | Attending: Neurology

## 2011-05-18 DIAGNOSIS — G35 Multiple sclerosis: Secondary | ICD-10-CM | POA: Insufficient documentation

## 2011-06-15 ENCOUNTER — Emergency Department (HOSPITAL_COMMUNITY): Payer: BC Managed Care – PPO

## 2011-06-15 ENCOUNTER — Encounter (HOSPITAL_COMMUNITY): Payer: BC Managed Care – PPO | Attending: Neurology

## 2011-06-15 ENCOUNTER — Emergency Department (HOSPITAL_COMMUNITY)
Admission: EM | Admit: 2011-06-15 | Discharge: 2011-06-15 | Disposition: A | Discharge status: Home / Self Care | Payer: BC Managed Care – PPO | Attending: Emergency Medicine | Admitting: Emergency Medicine

## 2011-06-15 DIAGNOSIS — H532 Diplopia: Secondary | ICD-10-CM | POA: Insufficient documentation

## 2011-06-15 DIAGNOSIS — I10 Essential (primary) hypertension: Secondary | ICD-10-CM | POA: Insufficient documentation

## 2011-06-15 DIAGNOSIS — G35 Multiple sclerosis: Secondary | ICD-10-CM | POA: Insufficient documentation

## 2011-06-15 DIAGNOSIS — R42 Dizziness and giddiness: Secondary | ICD-10-CM | POA: Insufficient documentation

## 2011-06-15 DIAGNOSIS — E119 Type 2 diabetes mellitus without complications: Secondary | ICD-10-CM | POA: Insufficient documentation

## 2011-06-15 LAB — CBC
HCT: 41 % (ref 36.0–46.0)
Hemoglobin: 14.1 g/dL (ref 12.0–15.0)
MCH: 30.7 pg (ref 26.0–34.0)
MCHC: 34.4 g/dL (ref 30.0–36.0)
MCV: 89.3 fL (ref 78.0–100.0)
Platelets: 307 10*3/uL (ref 150–400)
RBC: 4.59 MIL/uL (ref 3.87–5.11)
RDW: 12.7 % (ref 11.5–15.5)
WBC: 11.9 10*3/uL — ABNORMAL HIGH (ref 4.0–10.5)

## 2011-06-15 LAB — POCT I-STAT, CHEM 8
BUN: 6 mg/dL (ref 6–23)
Calcium, Ion: 1.11 mmol/L — ABNORMAL LOW (ref 1.12–1.32)
Chloride: 103 mEq/L (ref 96–112)
Creatinine, Ser: 0.5 mg/dL (ref 0.50–1.10)
Glucose, Bld: 212 mg/dL — ABNORMAL HIGH (ref 70–99)
HCT: 44 % (ref 36.0–46.0)
Hemoglobin: 15 g/dL (ref 12.0–15.0)
Potassium: 4.1 mEq/L (ref 3.5–5.1)
Sodium: 139 mEq/L (ref 135–145)
TCO2: 24 mmol/L (ref 0–100)

## 2011-06-15 LAB — URINE MICROSCOPIC-ADD ON

## 2011-06-15 LAB — URINALYSIS, ROUTINE W REFLEX MICROSCOPIC
Bilirubin Urine: NEGATIVE
Glucose, UA: >1000 mg/dL — AB
Ketones, ur: NEGATIVE mg/dL
Leukocytes, UA: NEGATIVE
Nitrite: NEGATIVE
Protein, ur: NEGATIVE mg/dL
Specific Gravity, Urine: 1.036 — ABNORMAL HIGH (ref 1.005–1.030)
Urobilinogen, UA: 0.2 mg/dL (ref 0.0–1.0)
pH: 6 (ref 5.0–8.0)

## 2011-06-15 LAB — DIFFERENTIAL
Basophils Absolute: 0 10*3/uL (ref 0.0–0.1)
Basophils Relative: 0 % (ref 0–1)
Eosinophils Absolute: 0.2 10*3/uL (ref 0.0–0.7)
Eosinophils Relative: 2 % (ref 0–5)
Lymphocytes Relative: 25 % (ref 12–46)
Lymphs Abs: 2.9 10*3/uL (ref 0.7–4.0)
Monocytes Absolute: 0.7 10*3/uL (ref 0.1–1.0)
Monocytes Relative: 6 % (ref 3–12)
Neutro Abs: 8 10*3/uL — ABNORMAL HIGH (ref 1.7–7.7)
Neutrophils Relative %: 68 % (ref 43–77)

## 2011-06-15 LAB — POCT PREGNANCY, URINE: Preg Test, Ur: NEGATIVE

## 2011-06-26 NOTE — Consult Note (Signed)
NAMEADRYAN, DRUCKENMILLER NO.:  [id number]  MEDICAL RECORD NO.:  [id number]  LOCATION:  WLED                         FACILITY:  [facility]  PHYSICIAN:  Levert Feinstein, MD          DATE OF BIRTH:  March 05, 1976  DATE OF CONSULTATION: DATE OF DISCHARGE:  06/15/2011                                CONSULTATION   Consult is from [facility] emergency room.  CHIEF COMPLAINT:  Spinning sensation, blurry vision after Tysabri infusion.  HISTORY OF PRESENT ILLNESS:  The patient is a 35 year old right-handed obese female who was diagnosed with possible multiple sclerosis, she came in for second Tysabri infusion today, underwent infusion without problems.  Around 4:30, 30 minutes after infusion, while reading her kindle, she noticed blurry vision, difficulty focusing, spinning sensation, she was sent from short stay to emergency room.  During her stay, her symptoms have gradually improved.  By the time I interviewed her, 6 hours after symptoms onset, she reported more than 80% improvement to her baseline.  CT of the brain without contrast reviewed, that was normal.  E-chart and [facility] chart reviewed, she has a history of possible MS, presenting with incontinence, gait difficulty, bilateral lower extremity paresthesia from genital area down,in June 10, 2009, she had extensive laboratory evaluation which has demonstrated normal or negative, B12 459, HIV, ESR mild elevated 43, rheumatoid factor, ANA, SSA, SSB,  Lyme antibody, lupus anticoagulant, angiotensin converting enzyme.  She also underwent spinal fluid testing which has demonstrated 7 IgG bands that are unique to CSF.  The rest of the CSF study has demonstrated WBC of 8, RBC of 89, protein of 29 and glucose of 102.  However, there are only minimum findings on the MRI of the brain and C-spine on June 11, 2009, and also on recent repeat scan October 2011, there was only one single isolated lesion in the frontoparietal periventricular  white matter.  No contrast enhancement.  MRI of the cervical has demonstrated mild degenerative disk disease at C4-5, C5-6, C6 and C7.  There is no cord compression, no demyelinating lesions involving spinal cord.  She was initially treated with Rebif, report could not tolerate it because of the side effects and continued symptomatic worsening such as gait difficulty, incontinence, was switched to Tysabri, initial infusion was June 4, most recent infusion was today, she tolerated the first infusion very well, reported improved symptoms after infusion, today she went through the infusion without difficulty, symptom onset was 30 minutes after the infusion.  REVIEW OF SYSTEMS:  Pertinent as above.  PAST MEDICAL HISTORY:  Obesity.  SURGICAL HISTORY:  C-section and gastric bariatric surgery.  ALLERGIES:  No known drug allergy.  HOME MEDICATIONS:  Tysabri, Lyrica, tizanidine, vitamin.  SOCIAL HISTORY:  She works at [employer].  No smoking, no driving.  Lives with her husband.  FAMILY HISTORY:  Noncontributory.  PHYSICAL EXAMINATION:  VITAL SIGNS:  Temperature 98.3, blood pressure 112/70, heart rate of 84, respirations of 16. CARDIAC:  Regular rate and rhythm.  She is obese. NECK:  Supple. NEURO:  Cranial nerves II through XII.  Pupil equal, round, reactive to light.  Right fundi sharp, I was not  able to visualize the left fundi, and extraocular movements were full.  Facial sensation, strength was normal.  Uvula, tongue midline.  Head turning, shoulder shrugging normal and symmetric.  Tongue protrusion to cheek strength was normal.  Motor examination, normal tone, bulk and strength in 4 extremities.  Deep tendon reflexes present and symmetric.  Plantar responses were flexor. Coordination, normal finger-to-nose, heel-to-shin.  There was no dysmetria.  Gait was steady.  ASSESSMENT/PLAN:  A 35 year old female with possible multiple sclerosis, second Tysabri infusion, complains of spinning  sensation, blurry vision, has much improved.  Literature revealed.  There is 6% of vertigo reported with Tysabri treatment, there is incident of 32-38% of headache,  The patient is recovering almost back to her baseline, will be discharged home from emergency.  I advised her to contact my office next week if her symptoms continues.  Otherwise, follow up with Dr. Anne Hahn as scheduled.     Levert Feinstein, MD     YY/MEDQ  D:  06/15/2011  T:  06/16/2011  Job:  [id number]  Electronically Signed by Levert Feinstein MD on 06/26/2011 08:45:02 AM

## 2011-07-03 ENCOUNTER — Encounter: Payer: Self-pay | Admitting: Internal Medicine

## 2011-07-13 ENCOUNTER — Encounter (HOSPITAL_COMMUNITY): Payer: Self-pay

## 2011-09-03 LAB — COMPREHENSIVE METABOLIC PANEL
ALT: 114 — ABNORMAL HIGH
AST: 56 — ABNORMAL HIGH
Albumin: 3.5
Alkaline Phosphatase: 73
BUN: 5 — ABNORMAL LOW
CO2: 27
Calcium: 8.4
Chloride: 102
Creatinine, Ser: 0.53
GFR calc Af Amer: >60
GFR calc non Af Amer: >60
Glucose, Bld: 143 — ABNORMAL HIGH
Potassium: 3.4 — ABNORMAL LOW
Sodium: 136
Total Bilirubin: 0.7
Total Protein: 6.9

## 2011-09-03 LAB — URINALYSIS, ROUTINE W REFLEX MICROSCOPIC
Bilirubin Urine: NEGATIVE
Glucose, UA: NEGATIVE
Ketones, ur: NEGATIVE
Leukocytes, UA: NEGATIVE
Nitrite: NEGATIVE
Protein, ur: NEGATIVE
Specific Gravity, Urine: 1.017
Urobilinogen, UA: 1
pH: 5.5

## 2011-09-03 LAB — CBC
HCT: 41.1
Hemoglobin: 13.9
MCHC: 33.9
MCV: 92.9
Platelets: 291
RBC: 4.43
RDW: 12.6
WBC: 9.8

## 2011-09-03 LAB — DIFFERENTIAL
Basophils Absolute: 0
Basophils Relative: 1
Eosinophils Absolute: 0.1
Eosinophils Relative: 2
Lymphocytes Relative: 37
Lymphs Abs: 3.6
Monocytes Absolute: 0.7
Monocytes Relative: 7
Neutro Abs: 5.2
Neutrophils Relative %: 53

## 2011-09-03 LAB — GLUCOSE, CAPILLARY: Glucose-Capillary: 149 — ABNORMAL HIGH

## 2011-09-03 LAB — LIPASE, BLOOD: Lipase: 23

## 2011-09-03 LAB — URINE MICROSCOPIC-ADD ON

## 2011-09-03 LAB — KETONES, QUALITATIVE: Acetone, Bld: NEGATIVE

## 2012-01-09 ENCOUNTER — Emergency Department (HOSPITAL_COMMUNITY)
Admission: EM | Admit: 2012-01-09 | Discharge: 2012-01-09 | Disposition: A | Discharge status: Home / Self Care | Payer: BC Managed Care – PPO | Attending: Emergency Medicine | Admitting: Emergency Medicine

## 2012-01-09 ENCOUNTER — Encounter (HOSPITAL_COMMUNITY): Payer: Self-pay | Admitting: *Deleted

## 2012-01-09 DIAGNOSIS — E282 Polycystic ovarian syndrome: Secondary | ICD-10-CM | POA: Insufficient documentation

## 2012-01-09 DIAGNOSIS — N938 Other specified abnormal uterine and vaginal bleeding: Secondary | ICD-10-CM | POA: Insufficient documentation

## 2012-01-09 DIAGNOSIS — I10 Essential (primary) hypertension: Secondary | ICD-10-CM | POA: Insufficient documentation

## 2012-01-09 DIAGNOSIS — N949 Unspecified condition associated with female genital organs and menstrual cycle: Secondary | ICD-10-CM | POA: Insufficient documentation

## 2012-01-09 DIAGNOSIS — G35 Multiple sclerosis: Secondary | ICD-10-CM | POA: Insufficient documentation

## 2012-01-09 DIAGNOSIS — K219 Gastro-esophageal reflux disease without esophagitis: Secondary | ICD-10-CM | POA: Insufficient documentation

## 2012-01-09 DIAGNOSIS — M79609 Pain in unspecified limb: Secondary | ICD-10-CM

## 2012-01-09 DIAGNOSIS — J45909 Unspecified asthma, uncomplicated: Secondary | ICD-10-CM | POA: Insufficient documentation

## 2012-01-09 DIAGNOSIS — E119 Type 2 diabetes mellitus without complications: Secondary | ICD-10-CM | POA: Insufficient documentation

## 2012-01-09 DIAGNOSIS — M357 Hypermobility syndrome: Secondary | ICD-10-CM | POA: Insufficient documentation

## 2012-01-09 DIAGNOSIS — N925 Other specified irregular menstruation: Secondary | ICD-10-CM | POA: Insufficient documentation

## 2012-01-09 LAB — POCT I-STAT 3, VENOUS BLOOD GAS (G3P V)
Acid-Base Excess: 1 mmol/L (ref 0.0–2.0)
Bicarbonate: 26.9 mEq/L — ABNORMAL HIGH (ref 20.0–24.0)
O2 Saturation: 74 %
TCO2: 28 mmol/L (ref 0–100)
pCO2, Ven: 46 mmHg (ref 45.0–50.0)
pH, Ven: 7.374 — ABNORMAL HIGH (ref 7.250–7.300)
pO2, Ven: 41 mmHg (ref 30.0–45.0)

## 2012-01-09 LAB — DIFFERENTIAL
Basophils Absolute: 0 10*3/uL (ref 0.0–0.1)
Basophils Relative: 0 % (ref 0–1)
Eosinophils Absolute: 0.2 10*3/uL (ref 0.0–0.7)
Eosinophils Relative: 2 % (ref 0–5)
Lymphocytes Relative: 18 % (ref 12–46)
Lymphs Abs: 1.6 10*3/uL (ref 0.7–4.0)
Monocytes Absolute: 0.7 10*3/uL (ref 0.1–1.0)
Monocytes Relative: 8 % (ref 3–12)
Neutro Abs: 6.3 10*3/uL (ref 1.7–7.7)
Neutrophils Relative %: 72 % (ref 43–77)

## 2012-01-09 LAB — POCT I-STAT, CHEM 8
BUN: 7 mg/dL (ref 6–23)
Calcium, Ion: 1.19 mmol/L (ref 1.12–1.32)
Chloride: 104 mEq/L (ref 96–112)
Creatinine, Ser: 0.6 mg/dL (ref 0.50–1.10)
Glucose, Bld: 191 mg/dL — ABNORMAL HIGH (ref 70–99)
HCT: 47 % — ABNORMAL HIGH (ref 36.0–46.0)
Hemoglobin: 16 g/dL — ABNORMAL HIGH (ref 12.0–15.0)
Potassium: 3.6 mEq/L (ref 3.5–5.1)
Sodium: 141 mEq/L (ref 135–145)
TCO2: 26 mmol/L (ref 0–100)

## 2012-01-09 LAB — CBC
HCT: 42.9 % (ref 36.0–46.0)
Hemoglobin: 15.2 g/dL — ABNORMAL HIGH (ref 12.0–15.0)
MCH: 31.7 pg (ref 26.0–34.0)
MCHC: 35.4 g/dL (ref 30.0–36.0)
MCV: 89.6 fL (ref 78.0–100.0)
Platelets: 368 10*3/uL (ref 150–400)
RBC: 4.79 MIL/uL (ref 3.87–5.11)
RDW: 12.7 % (ref 11.5–15.5)
WBC: 8.8 10*3/uL (ref 4.0–10.5)

## 2012-01-09 MED ORDER — OXYCODONE-ACETAMINOPHEN 5-325 MG PO TABS
2.0000 | ORAL_TABLET | Freq: Once | ORAL | Status: AC
  Administered 2012-01-09: 2 via ORAL
  Filled 2012-01-09: qty 2

## 2012-01-09 MED ORDER — OXYCODONE-ACETAMINOPHEN 5-325 MG PO TABS: 1.0000 | ORAL_TABLET | Freq: Four times a day (QID) | ORAL | Status: AC | PRN

## 2012-01-09 NOTE — ED Provider Notes (Signed)
History     CSN: [id number]  Arrival date & time 01/09/12  2121   First MD Initiated Contact with Patient 01/09/12 2149      Chief Complaint  Patient presents with  . Medication Reaction  . Vaginal Bleeding     HPI  History provided by the patient. Patient is a 36 year old female with history of diabetes, hypertension, polycystic ovarian syndrome, multiple sclerosis who presents with complaints of upper and lower extremity pain. Patient reports having acute onset of extremity pains described as sharp needles that began 2 days ago. Patient states pain and symptoms feel similar to prior flareups of her MS. Patient states she has been trying to deal with the symptoms at home by taking Tylenol and Advil. Patient states that she has not made any changes in her medications. She is currently taking Lyrica and Gilenya.  Patient reports last having similar symptoms over 6 months ago. Patient also states that her symptoms feel as bad as they did when she was first diagnosed with MS several years ago. At that time she also reports having an episode of complete paralysis in her extremities. Patient denies having any injury.   Patient also mentioned additional symptoms of heavier than normal vaginal bleeding. Patient states she just began her menstrual cycle today has been passing heavy clots and bleeding. She reports changing a heavy pad every 15 minutes to half hour. She reports having similar symptoms previously and has an upcoming appointment with her OB/GYN doctor this Monday. She reports having associated cramping with her bleeding. Cramping is worse when she passes a clot. Abdomen and pelvic pains did not come on acutely and are waxing and waning. She denies having any fever, chills, sweats nausea or vomiting.    Past Medical History  Diagnosis Date  . Asthma   . Diabetes mellitus   . GERD (gastroesophageal reflux disease)   . Hypertension   . PCOS (polycystic ovarian syndrome)   .  Gastroparesis   . NASH (nonalcoholic steatohepatitis)   . Polyneuropathy   . Multiple sclerosis     Dr. Anne Hahn    Past Surgical History  Procedure Date  . Cholecystectomy   . Dilation and curettage of uterus   . Laparoscopic gastric banding 01/26/08    Family History  Problem Relation Age of Onset  . Diabetes Mother   . Depression Mother   . Hypertension Mother   . Liver disease Father   . Depression Daughter     bipolar depression  . Coronary artery disease Other   . Cancer Other     ovarian    History  Substance Use Topics  . Smoking status: Never Smoker   . Smokeless tobacco: Not on file  . Alcohol Use: No    OB History    Grav Para Term Preterm Abortions TAB SAB Ect Mult Living                  Review of Systems  Constitutional: Negative for fever and chills.  Respiratory: Negative for cough and shortness of breath.   Cardiovascular: Negative for chest pain.  Gastrointestinal: Negative for nausea, vomiting, abdominal pain, diarrhea and constipation.  Genitourinary: Positive for vaginal bleeding. Negative for dysuria, frequency, hematuria, flank pain and vaginal discharge.  Neurological: Negative for dizziness, light-headedness and headaches.  All other systems reviewed and are negative.    Allergies  Pioglitazone  Home Medications   Current Outpatient Rx  Name Route Sig Dispense Refill  . ACETAMINOPHEN 500 MG  PO TABS Oral Take 500 mg by mouth every 6 (six) hours as needed. For pain    . FINGOLIMOD HCL 0.5 MG PO CAPS Oral Take 1 capsule by mouth every morning.    [name] PREGABALIN 100 MG PO CAPS Oral Take 100 mg by mouth 3 (three) times daily.        BP 144/96  Pulse 97  Temp(Src) 98.1 F (36.7 C) (Oral)  SpO2 96%  LMP 01/09/2012  Physical Exam  Nursing note and vitals reviewed. Constitutional: She is oriented to person, place, and time. She appears well-developed and well-nourished. No distress.  HENT:  Head: Normocephalic and atraumatic.    Cardiovascular: Normal rate and regular rhythm.   No murmur heard. Pulmonary/Chest: Effort normal and breath sounds normal. No respiratory distress. She has no wheezes. She has no rales.  Abdominal: Soft. She exhibits no distension. There is no tenderness. There is no rebound and no guarding.  Neurological: She is alert and oriented to person, place, and time. She has normal strength. No sensory deficit. Gait normal.  Skin: Skin is warm and dry. No rash noted.  Psychiatric: She has a normal mood and affect. Her behavior is normal.    ED Course  Procedures    Labs Reviewed  CBC  DIFFERENTIAL   Results for orders placed during the hospital encounter of 01/09/12  CBC      Component Value Range   WBC 8.8  4.0 - 10.5 (K/uL)   RBC 4.79  3.87 - 5.11 (MIL/uL)   Hemoglobin 15.2 (*) 12.0 - 15.0 (g/dL)   HCT 16.1  09.6 - 04.5 (%)   MCV 89.6  78.0 - 100.0 (fL)   MCH 31.7  26.0 - 34.0 (pg)   MCHC 35.4  30.0 - 36.0 (g/dL)   RDW 40.9  81.1 - 91.4 (%)   Platelets 368  150 - 400 (K/uL)  DIFFERENTIAL      Component Value Range   Neutrophils Relative 72  43 - 77 (%)   Neutro Abs 6.3  1.7 - 7.7 (K/uL)   Lymphocytes Relative 18  12 - 46 (%)   Lymphs Abs 1.6  0.7 - 4.0 (K/uL)   Monocytes Relative 8  3 - 12 (%)   Monocytes Absolute 0.7  0.1 - 1.0 (K/uL)   Eosinophils Relative 2  0 - 5 (%)   Eosinophils Absolute 0.2  0.0 - 0.7 (K/uL)   Basophils Relative 0  0 - 1 (%)   Basophils Absolute 0.0  0.0 - 0.1 (K/uL)  POCT I-STAT 3, BLOOD GAS (G3P V)      Component Value Range   pH, Ven 7.374 (*) 7.250 - 7.300    pCO2, Ven 46.0  45.0 - 50.0 (mmHg)   pO2, Ven 41.0  30.0 - 45.0 (mmHg)   Bicarbonate 26.9 (*) 20.0 - 24.0 (mEq/L)   TCO2 28  0 - 100 (mmol/L)   O2 Saturation 74.0     Acid-Base Excess 1.0  0.0 - 2.0 (mmol/L)   Sample type VENOUS    POCT I-STAT, CHEM 8      Component Value Range   Sodium 141  135 - 145 (mEq/L)   Potassium 3.6  3.5 - 5.1 (mEq/L)   Chloride 104  96 - 112 (mEq/L)   BUN  7  6 - 23 (mg/dL)   Creatinine, Ser 7.82  0.50 - 1.10 (mg/dL)   Glucose, Bld 956 (*) 70 - 99 (mg/dL)   Calcium, Ion 2.13  0.86 - 1.32 (  mmol/L)   TCO2 26  0 - 100 (mmol/L)   Hemoglobin 16.0 (*) 12.0 - 15.0 (g/dL)   HCT 16.1 (*) 09.6 - 46.0 (%)      1. Pain in extremity   2. Multiple sclerosis       MDM  Patient seen and evaluated. Patient in no acute distress. I discussed the patient's options to evaluate complaints of vaginal bleeding. Patient has declined to have any pelvic exam or evaluation. Previously she has upcoming appointment for ultrasounds and evaluation by OB/GYN on Monday. Patient denies any acute or persistent pains or concerning symptoms for any emergent condition.  Patient was seen and evaluated by attending physician. Patient feeling better after pain medications. At this time labs are showing concerning findings. Normal H&H. Plan to discharge patient home with pain medications and followup with Dr. Anne Hahn.      Angus Seller, PA 01/10/12 0040

## 2012-01-09 NOTE — ED Notes (Signed)
Pt is here for problems related to her MS.  She states that the lyrica stopped working and she is having needle like pain and numbness in feet and hands which began yesterday. Now she is having that symptoms plus pain in her spine and extreme fatigue.  Pt began her period today and it is heavier than usual and she has been having clots and some cramping.

## 2012-01-09 NOTE — ED Notes (Signed)
C/o BUE pain FA to hands, BLE pain knee to foot and R lower back & spine pain, onset 2d ago, "usually numbness & tingling, but pain has escalated and gotten out of control, now vomiting d/t pain and unable to keep meds down", lyrica not working (100mg  TID), also takes tylenol and aleve. Pt of neuro MD Dr. Anne Hahn. Last visit was a few months ago. Also reports abd cramps & vaginal bleeding, being worked up for fibroids, has upcoming OB visit with [place] next week. Changing pads every 15 minutes (saturated), states, "i don't know" to question of "are you sob, weak or dizzy?"  Alert, NAD, calm, interactive, skin W&D, resps e/u, speaking in clear complete sentences, steady gait. Mentions "recent sickness which may have triggered MS exacerbation". States, "last time pain was this bad was a few years ago when I had full paralysis". Sometimes uses a scooter.

## 2012-01-09 NOTE — ED Notes (Signed)
Patient is AOx4 and comfortable with her discharge instructions. 

## 2012-01-10 NOTE — ED Provider Notes (Signed)
Medical screening examination/treatment/procedure(s) were conducted as a shared visit with non-physician practitioner(s) and myself.  I personally evaluated the patient during the encounter This F w MS, now p/w vaginal bleeding.  Patient improved following meds.  Hgb stable. I discussed the possibility of additional medications at length with the patient, who was not started on new steroids, knowing that she will followup with her neurologist and her primary care physician. She was d/c in stable condition.  Gerhard Munch, MD 01/10/12 2340

## 2012-02-01 ENCOUNTER — Ambulatory Visit (INDEPENDENT_AMBULATORY_CARE_PROVIDER_SITE_OTHER): Payer: BC Managed Care – PPO | Admitting: Internal Medicine

## 2012-02-01 ENCOUNTER — Encounter: Payer: Self-pay | Admitting: Internal Medicine

## 2012-02-01 DIAGNOSIS — G35 Multiple sclerosis: Secondary | ICD-10-CM

## 2012-02-01 DIAGNOSIS — Z01818 Encounter for other preprocedural examination: Secondary | ICD-10-CM

## 2012-02-01 DIAGNOSIS — K7689 Other specified diseases of liver: Secondary | ICD-10-CM

## 2012-02-01 DIAGNOSIS — E119 Type 2 diabetes mellitus without complications: Secondary | ICD-10-CM

## 2012-02-01 DIAGNOSIS — I10 Essential (primary) hypertension: Secondary | ICD-10-CM

## 2012-02-01 LAB — HEMOGLOBIN A1C: Hgb A1c MFr Bld: 6.8 % — ABNORMAL HIGH (ref 4.6–6.5)

## 2012-02-01 LAB — HEPATIC FUNCTION PANEL
ALT: 66 U/L — ABNORMAL HIGH (ref 0–35)
AST: 29 U/L (ref 0–37)
Albumin: 4.2 g/dL (ref 3.5–5.2)
Alkaline Phosphatase: 51 U/L (ref 39–117)
Bilirubin, Direct: 0 mg/dL (ref 0.0–0.3)
Total Bilirubin: 0.1 mg/dL — ABNORMAL LOW (ref 0.3–1.2)
Total Protein: 7.4 g/dL (ref 6.0–8.3)

## 2012-02-01 LAB — BASIC METABOLIC PANEL
BUN: 6 mg/dL (ref 6–23)
CO2: 26 mEq/L (ref 19–32)
Calcium: 9 mg/dL (ref 8.4–10.5)
Chloride: 103 mEq/L (ref 96–112)
Creatinine, Ser: 0.6 mg/dL (ref 0.4–1.2)
GFR: 121.3 mL/min (ref >60.00)
Glucose, Bld: 138 mg/dL — ABNORMAL HIGH (ref 70–99)
Potassium: 3.6 mEq/L (ref 3.5–5.1)
Sodium: 137 mEq/L (ref 135–145)

## 2012-02-01 MED ORDER — LOSARTAN POTASSIUM 50 MG PO TABS: 50.0000 mg | ORAL_TABLET | Freq: Every day | ORAL | Status: DC

## 2012-02-01 NOTE — Assessment & Plan Note (Signed)
We will try to better control her BP before her surgery.  Her EKG is normal.  I would not suggest any further cardiac testing. She is asymptomatic.  Patient's status of diabetes currently unknown. I suspect her A1c has worsened.  We discussed starting insulin to better control  blood sugars before her surgery.

## 2012-02-01 NOTE — Assessment & Plan Note (Signed)
Patient has not been following her blood sugars at home. Her last A1c was in 2011. She has had difficulty tolerating metformin in the past. Her blood sugars have also been exacerbated by use of steroids during multiple sclerosis flares. Obtain A1c today. We discussed starting insulin to control her blood sugar. Lab Results  Component Value Date   HGBA1C 6.7* 09/04/2010

## 2012-02-01 NOTE — Progress Notes (Signed)
Subjective:    Patient ID: [name], female    DOB: 11-09-1976, 36 y.o.   MRN: [id number]  HPI  36 year old Hispanic female with a history of type 2 diabetes, asthma, multiple sclerosis with hx of transverse myelitis for routine followup and preoperative evaluation. Patient reports she has been struggling with painful fibroids for last several months. She has tried medical treatment without improvement. A total hysterectomy is planned by her gynecologist on 02/21/2012.  She has not had diabetic followup for greater than a year. She does not monitor her blood sugar at home. Recent chart reviewed notes blood sugars greater than 200 when she was seen in the emergency room. She also notes elevated blood pressure readings at gynecologist office.  Multiple sclerosis-patient is completely disabled. She is followed by her neurologist - Dr. Anne Hahn. She has intermittent difficulty with walking and well as other ADLs.  She reports a medication for multiple sclerosis previously caused transient blindness.  Review of Systems   Constitutional: Negative for activity change, appetite change  Eyes: intermittent visual disturbances  Respiratory: Negative for cough, chest tightness and shortness of breath.  no recent asthma flare Cardiovascular: Negative for chest pain.  Genitourinary: Negative for difficulty urinating.  Neurological: Negative for headaches.  Gastrointestinal: Negative for abdominal pain, heartburn melena or hematochezia      Past Medical History  Diagnosis Date  . Asthma   . Diabetes mellitus   . GERD (gastroesophageal reflux disease)   . Hypertension   . PCOS (polycystic ovarian syndrome)   . Gastroparesis   . NASH (nonalcoholic steatohepatitis)   . Polyneuropathy   . Multiple sclerosis     Dr. Anne Hahn    History   Social History  . Marital Status: Married    Spouse Name: Barbara Cower    Number of Children: N/A  . Years of Education: N/A   Occupational History  . RESEARCH  [employer]   Social History Main Topics  . Smoking status: Never Smoker   . Smokeless tobacco: Not on file  . Alcohol Use: No  . Drug Use:   . Sexually Active:    Other Topics Concern  . Not on file   Social History Narrative  . No narrative on file    Past Surgical History  Procedure Date  . Cholecystectomy   . Dilation and curettage of uterus   . Laparoscopic gastric banding 01/26/08    Family History  Problem Relation Age of Onset  . Diabetes Mother   . Depression Mother   . Hypertension Mother   . Liver disease Father   . Depression Daughter     bipolar depression  . Coronary artery disease Other   . Cancer Other     ovarian    Allergies  Allergen Reactions  . Pioglitazone Swelling    REACTION: LE EDEMA    Current Outpatient Prescriptions on File Prior to Visit  Medication Sig Dispense Refill  . pregabalin (LYRICA) 100 MG capsule Take 100 mg by mouth 3 (three) times daily.          BP 152/84  Pulse 80  Temp(Src) 98.3 F (36.8 C) (Oral)  Ht 5\' 3"  (1.6 m)  Wt 265 lb (120.203 kg)  BMI 46.94 kg/m2  LMP 01/09/2012  EKG shows normal sinus rhythm at 76 beats per minute, occasional PACs  Objective:   Physical Exam   Constitutional: pleasant, obese  Head: Normocephalic and atraumatic.  Ear:  Right and left ear normal.  TMs clear.  Hearing is grossly  normal Mouth/Throat: Oropharynx is clear and moist.  Eyes: Conjunctivae are normal. Pupils are equal, round, and reactive to light.  Neck: Normal range of motion. Neck supple. No thyromegaly present. No carotid bruit Cardiovascular: Normal rate, regular rhythm and normal heart sounds.  Exam reveals no gallop and no friction rub.  No murmur heard.  Trace lower extremity edema Pulmonary/Chest: Effort normal and breath sounds normal.  No wheezes. No rales.  Abdominal: Soft. Bowel sounds are normal. No mass. There is no tenderness.  Neurological: Alert. No cranial nerve deficit. normal gait Skin: Skin is warm and  dry. no cracks or fissures of her feet.  Normal sensation to temperature and vibration Psychiatric: Normal mood and affect. Behavior is normal.      Assessment & Plan:

## 2012-02-01 NOTE — Assessment & Plan Note (Signed)
Multiple sclerosis with history of transverse myelitis. She is followed by neurology-Dr. Anne Hahn. She is currently on Gilenya.  She is on disability due to intermittent difficulty with walking, loss of bowel control, and memory loss.

## 2012-02-01 NOTE — Assessment & Plan Note (Addendum)
The patient was previously on antihypertensive but stopped due to normalization of her blood pressure. Over the last 3-6 months her blood pressure has been elevated. Patient scheduled for complete hysterectomy. Considering her diabetes, start ARB losartan 50 mg once daily. Avoid ACE inhibitors considering history of asthma.  BP: 152/84 mmHg  Lab Results  Component Value Date   CREATININE 0.60 01/09/2012

## 2012-02-02 LAB — MICROALBUMIN / CREATININE URINE RATIO
Creatinine, Urine: 291.1 mg/dL
Microalb Creat Ratio: 5.9 mg/g (ref 0.0–30.0)
Microalb, Ur: 1.71 mg/dL (ref 0.00–1.89)

## 2012-02-04 NOTE — Progress Notes (Signed)
Pt saw pcp Dr. Artist Pais on march 1st and had blood work drawn. She was informed that he wanted to see her before her surgery. She did state she would have clearance but he wanted to see her before. She is coming march 11th for her pre-op appointment.

## 2012-02-07 ENCOUNTER — Encounter (HOSPITAL_COMMUNITY): Payer: Self-pay

## 2012-02-11 ENCOUNTER — Encounter (HOSPITAL_COMMUNITY)
Admission: RE | Admit: 2012-02-11 | Discharge: 2012-02-11 | Disposition: A | Discharge status: Home / Self Care | Payer: BC Managed Care – PPO | Source: Ambulatory Visit | Attending: Obstetrics & Gynecology | Admitting: Obstetrics & Gynecology

## 2012-02-18 ENCOUNTER — Encounter: Payer: Self-pay | Admitting: Internal Medicine

## 2012-02-18 ENCOUNTER — Ambulatory Visit (INDEPENDENT_AMBULATORY_CARE_PROVIDER_SITE_OTHER): Payer: BC Managed Care – PPO | Admitting: Internal Medicine

## 2012-02-18 VITALS — BP 132/86 | HR 84 | Temp 98.5°F | Ht 63.0 in | Wt 264.0 lb

## 2012-02-18 DIAGNOSIS — E119 Type 2 diabetes mellitus without complications: Secondary | ICD-10-CM

## 2012-02-18 DIAGNOSIS — I10 Essential (primary) hypertension: Secondary | ICD-10-CM

## 2012-02-18 MED ORDER — METFORMIN HCL ER 500 MG PO TB24: 500.0000 mg | ORAL_TABLET | Freq: Two times a day (BID) | ORAL | Status: DC

## 2012-02-18 MED ORDER — SITAGLIPTIN PHOSPHATE 100 MG PO TABS: 100.0000 mg | ORAL_TABLET | Freq: Every day | ORAL | Status: DC

## 2012-02-18 MED ORDER — HYDROCODONE-ACETAMINOPHEN 7.5-325 MG PO TABS: 1.0000 | ORAL_TABLET | Freq: Every day | ORAL | Status: DC

## 2012-02-18 MED ORDER — LOSARTAN POTASSIUM 50 MG PO TABS: 50.0000 mg | ORAL_TABLET | Freq: Every day | ORAL | Status: DC

## 2012-02-18 MED ORDER — GLUCOSE BLOOD VI STRP: ORAL_STRIP | Status: DC

## 2012-02-18 NOTE — Progress Notes (Signed)
Subjective:    Patient ID: [name], female    DOB: 04-23-1976, 36 y.o.   MRN: [id number]  HPI  36 year old Hispanic female with type 2 diabetes and hypertension for routine followup. At previous visit due to elevated blood pressure readings patient was started on losartan 50 mg once daily. Patient is tolerating this without any side effects. Her blood pressure has responded nicely with her systolic readings in the 130s and diastolic readings in the 80s.  There is also concern of uncontrolled diabetes. However her A1c was fairly normal at 6.8.  She is not currently taking any oral agents but is diet controlled. She previously tried taking metformin but stopped due to GI side effects. She was previously taking regular metformin 500 mg 3 times daily.  She does not check her blood sugar on a regular basis and requests a prescription for new glucometer.   Review of Systems She is still experiencing cramps that are painful.  She has been using various pain meds (motrin and vicodin)  Past Medical History  Diagnosis Date  . Asthma   . Diabetes mellitus   . GERD (gastroesophageal reflux disease)   . Hypertension   . PCOS (polycystic ovarian syndrome)   . Gastroparesis   . NASH (nonalcoholic steatohepatitis)   . Polyneuropathy   . Multiple sclerosis     Dr. Anne Hahn    History   Social History  . Marital Status: Married    Spouse Name: Barbara Cower    Number of Children: N/A  . Years of Education: N/A   Occupational History  . 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 Smoking status: Never Smoker   . Smokeless tobacco: Not on file  . Alcohol Use: No  . Drug Use:   . Sexually Active:    Other Topics Concern  . Not on file   Social History Narrative  . No narrative on file    Past Surgical History  Procedure Date  . Cholecystectomy   . Dilation and curettage of uterus   . Laparoscopic gastric banding 01/26/08    Family History  Problem Relation Age of Onset  . Diabetes  Mother   . Depression Mother   . Hypertension Mother   . Liver disease Father   . Depression Daughter     bipolar depression  . Coronary artery disease Other   . Cancer Other     ovarian    No Known Allergies  Current Outpatient Prescriptions on File Prior to Visit  Medication Sig Dispense Refill  . albuterol (PROVENTIL) (2.5 MG/3ML) 0.083% nebulizer solution Take 2.5 mg by nebulization as needed. Uses about twice/year for allergies.  Only has nebulizer not MDI.      . cholecalciferol (VITAMIN D) 1000 UNITS tablet Take 1,000 Units by mouth daily.      [name] GILENYA 0.5 MG CAPS Take 1 tablet by mouth daily.      [name] ibuprofen (ADVIL,MOTRIN) 800 MG tablet Take 1 tablet by mouth Every 6 hours.      . norethindrone (AYGESTIN) 5 MG tablet Take 1 tablet by mouth 3 (three) times daily.       . pregabalin (LYRICA) 100 MG capsule Take 100 mg by mouth 3 (three) times daily.          BP 132/86  Pulse 84  Temp(Src) 98.5 F (36.9 C) (Oral)  Ht 5\' 3"  (1.6 m)  Wt 264 lb (119.75 kg)  BMI 46.77 kg/m2       Objective:  Physical Exam  Constitutional: She is oriented to person, place, and time. She appears well-developed and well-nourished.  HENT:  Head: Normocephalic.  Cardiovascular: Normal rate, regular rhythm and normal heart sounds.   Pulmonary/Chest: Effort normal and breath sounds normal. She has no wheezes. She has no rales.  Neurological: She is alert and oriented to person, place, and time.  Skin: Skin is warm and dry.  Psychiatric: Her behavior is normal.      Assessment & Plan:

## 2012-02-18 NOTE — Assessment & Plan Note (Signed)
A1c is fairly stable with diabetic diet.  She had difficulty tolerating regular metformin in the past. Switch to metformin xr 500 mg twice a day. Patient instructed to start with once daily dose times one week. Also add Januvia 100 mg once daily.

## 2012-02-18 NOTE — Assessment & Plan Note (Signed)
BP improved with losartan 50 mg.  Continue same dose.   Patient advised to continue monitoring her blood pressure at home. She understands her goal blood pressure is less than 130/80. We discussed risks of taking NSAIDs with ARB or ACE inhibitors.  Patient advised to take hydrocodone as needed for severe cramps/pain. BP: 132/86 mmHg  Lab Results  Component Value Date   CREATININE 0.6 02/01/2012

## 2012-03-07 ENCOUNTER — Other Ambulatory Visit: Payer: Self-pay | Admitting: Obstetrics & Gynecology

## 2012-03-10 ENCOUNTER — Telehealth: Payer: Self-pay | Admitting: Family Medicine

## 2012-03-10 NOTE — Telephone Encounter (Signed)
Patient needs to be seen at urgent care.  We can not call in antibiotics

## 2012-03-10 NOTE — Telephone Encounter (Signed)
Pulled from Triage vmail. Patient is on vacation in [place], [place]. She is really sick and trying to avoid going to an UC there. Per pt, she has: face pain, eye/sinus pressure, ear pain, and is coughing up "junk". She is wondering if something can be called in to the [facility] in [place], [place]. Please call pt. Thanks!

## 2012-03-10 NOTE — Telephone Encounter (Signed)
L/m on pts cell phone to go to urgent care

## 2012-03-14 ENCOUNTER — Telehealth: Payer: Self-pay | Admitting: *Deleted

## 2012-03-14 NOTE — Telephone Encounter (Signed)
Pt is having surgery on 4/17 and she is currently at [facility] for her pre-op.  They do need written medical clearance faxed to [phone] if possible today

## 2012-03-17 ENCOUNTER — Encounter (HOSPITAL_COMMUNITY): Payer: Self-pay

## 2012-03-17 ENCOUNTER — Encounter: Payer: Self-pay | Admitting: Internal Medicine

## 2012-03-17 ENCOUNTER — Encounter (HOSPITAL_COMMUNITY)
Admission: RE | Admit: 2012-03-17 | Discharge: 2012-03-17 | Disposition: A | Discharge status: Home / Self Care | Payer: BC Managed Care – PPO | Source: Ambulatory Visit | Attending: Obstetrics & Gynecology | Admitting: Obstetrics & Gynecology

## 2012-03-17 ENCOUNTER — Other Ambulatory Visit (HOSPITAL_COMMUNITY): Payer: BC Managed Care – PPO

## 2012-03-17 LAB — CBC
HCT: 44.7 % (ref 36.0–46.0)
Hemoglobin: 15 g/dL (ref 12.0–15.0)
MCH: 30.6 pg (ref 26.0–34.0)
MCHC: 33.6 g/dL (ref 30.0–36.0)
MCV: 91.2 fL (ref 78.0–100.0)
Platelets: 426 10*3/uL — ABNORMAL HIGH (ref 150–400)
RBC: 4.9 MIL/uL (ref 3.87–5.11)
RDW: 12.5 % (ref 11.5–15.5)
WBC: 8 10*3/uL (ref 4.0–10.5)

## 2012-03-17 LAB — COMPREHENSIVE METABOLIC PANEL
ALT: 99 U/L — ABNORMAL HIGH (ref 0–35)
AST: 57 U/L — ABNORMAL HIGH (ref 0–37)
Albumin: 4.4 g/dL (ref 3.5–5.2)
Alkaline Phosphatase: 68 U/L (ref 39–117)
BUN: 7 mg/dL (ref 6–23)
CO2: 25 mEq/L (ref 19–32)
Calcium: 10 mg/dL (ref 8.4–10.5)
Chloride: 99 mEq/L (ref 96–112)
Creatinine, Ser: 0.6 mg/dL (ref 0.50–1.10)
GFR calc Af Amer: >90 mL/min (ref >90)
GFR calc non Af Amer: >90 mL/min (ref >90)
Glucose, Bld: 92 mg/dL (ref 70–99)
Potassium: 3.9 mEq/L (ref 3.5–5.1)
Sodium: 136 mEq/L (ref 135–145)
Total Bilirubin: 0.4 mg/dL (ref 0.3–1.2)
Total Protein: 7.9 g/dL (ref 6.0–8.3)

## 2012-03-17 LAB — SURGICAL PCR SCREEN
MRSA, PCR: NEGATIVE
Staphylococcus aureus: NEGATIVE

## 2012-03-17 NOTE — Telephone Encounter (Signed)
Letter printed and faxed to today

## 2012-03-17 NOTE — Patient Instructions (Addendum)
   Your procedure is scheduled on: Wednesday April 17th  Enter through the [employer] [facility] at: 12noon Pick up the phone at the desk and dial [phone] and inform us of your arrival.  Please call this number if you have any problems the morning of surgery: [phone]  Remember: Do not eat food after midnight: Tuesday Do not drink clear liquids after: Wednesday at 9:30am then nothing Take these medicines the morning of surgery with a SIP OF WATER: losartan, lyrica. Hold metformin Tuesday bedtime and Wednesday morning. Hold Januvia morning of surgery (Wednesday).   Do not wear jewelry, make-up, or FINGER nail polish Do not wear lotions, powders, perfumes or deodorant. Do not shave 48 hours prior to surgery. Do not bring valuables to the hospital. Contacts, dentures or bridgework may not be worn into surgery.  Leave suitcase in the car. After Surgery it may be brought to your room. For patients being admitted to the hospital, checkout time is 11:00am the day of discharge.  Patients discharged on the day of surgery will not be allowed to drive home.     Remember to use your hibiclens as instructed.Please shower with 1/2 bottle the evening before your surgery and the other 1/2 bottle the morning of surgery. Neck down avoiding private area.

## 2012-03-18 MED ORDER — CEFAZOLIN SODIUM-DEXTROSE 2-3 GM-% IV SOLR
2.0000 g | INTRAVENOUS | Status: AC
  Administered 2012-03-19: 2 g via INTRAVENOUS
  Filled 2012-03-18: qty 50

## 2012-03-19 ENCOUNTER — Ambulatory Visit (HOSPITAL_COMMUNITY)
Admission: RE | Admit: 2012-03-19 | Discharge: 2012-03-20 | Disposition: A | Discharge status: Home / Self Care | Payer: BC Managed Care – PPO | Source: Ambulatory Visit | Attending: Obstetrics & Gynecology | Admitting: Obstetrics & Gynecology

## 2012-03-19 ENCOUNTER — Ambulatory Visit (HOSPITAL_COMMUNITY): Payer: BC Managed Care – PPO | Admitting: Anesthesiology

## 2012-03-19 ENCOUNTER — Encounter (HOSPITAL_COMMUNITY)
Admission: RE | Disposition: A | Discharge status: Home / Self Care | Payer: Self-pay | Source: Ambulatory Visit | Attending: Obstetrics & Gynecology

## 2012-03-19 ENCOUNTER — Encounter (HOSPITAL_COMMUNITY): Payer: Self-pay | Admitting: Anesthesiology

## 2012-03-19 DIAGNOSIS — N803 Endometriosis of pelvic peritoneum, unspecified: Secondary | ICD-10-CM | POA: Insufficient documentation

## 2012-03-19 DIAGNOSIS — Z01812 Encounter for preprocedural laboratory examination: Secondary | ICD-10-CM | POA: Insufficient documentation

## 2012-03-19 DIAGNOSIS — Z30432 Encounter for removal of intrauterine contraceptive device: Secondary | ICD-10-CM | POA: Insufficient documentation

## 2012-03-19 DIAGNOSIS — Z01818 Encounter for other preprocedural examination: Secondary | ICD-10-CM | POA: Insufficient documentation

## 2012-03-19 DIAGNOSIS — N920 Excessive and frequent menstruation with regular cycle: Secondary | ICD-10-CM | POA: Insufficient documentation

## 2012-03-19 DIAGNOSIS — N949 Unspecified condition associated with female genital organs and menstrual cycle: Secondary | ICD-10-CM | POA: Insufficient documentation

## 2012-03-19 HISTORY — PX: IUD REMOVAL: SHX5392

## 2012-03-19 LAB — GLUCOSE, CAPILLARY: Glucose-Capillary: 89 mg/dL (ref 70–99)

## 2012-03-19 LAB — TYPE AND SCREEN
ABO/RH(D): O NEG
Antibody Screen: NEGATIVE

## 2012-03-19 LAB — ABO/RH: ABO/RH(D): O NEG

## 2012-03-19 LAB — PREGNANCY, URINE: Preg Test, Ur: NEGATIVE

## 2012-03-19 SURGERY — ROBOTIC ASSISTED TOTAL HYSTERECTOMY
Anesthesia: General | Site: Uterus | Wound class: Clean Contaminated

## 2012-03-19 MED ORDER — ROCURONIUM BROMIDE 100 MG/10ML IV SOLN
INTRAVENOUS | Status: DC | PRN
  Administered 2012-03-19: 20 mg via INTRAVENOUS
  Administered 2012-03-19: 50 mg via INTRAVENOUS

## 2012-03-19 MED ORDER — ROCURONIUM BROMIDE 50 MG/5ML IV SOLN
INTRAVENOUS | Status: AC
  Filled 2012-03-19: qty 1

## 2012-03-19 MED ORDER — FENTANYL CITRATE 0.05 MG/ML IJ SOLN
INTRAMUSCULAR | Status: AC
  Administered 2012-03-19: 50 ug via INTRAVENOUS
  Filled 2012-03-19: qty 2

## 2012-03-19 MED ORDER — MIDAZOLAM HCL 5 MG/5ML IJ SOLN
INTRAMUSCULAR | Status: DC | PRN
  Administered 2012-03-19: 2 mg via INTRAVENOUS

## 2012-03-19 MED ORDER — SUFENTANIL CITRATE 50 MCG/ML IV SOLN
INTRAVENOUS | Status: DC | PRN
  Administered 2012-03-19 (×3): 10 ug via INTRAVENOUS
  Administered 2012-03-19: 5 ug via INTRAVENOUS
  Administered 2012-03-19: 15 ug via INTRAVENOUS

## 2012-03-19 MED ORDER — PHENYLEPHRINE HCL 10 MG/ML IJ SOLN
INTRAMUSCULAR | Status: DC | PRN
  Administered 2012-03-19 (×2): .8 mg via INTRAVENOUS
  Administered 2012-03-19: .4 mg via INTRAVENOUS

## 2012-03-19 MED ORDER — LACTATED RINGERS IV SOLN
INTRAVENOUS | Status: DC
  Administered 2012-03-19 (×3): via INTRAVENOUS

## 2012-03-19 MED ORDER — HYDROMORPHONE HCL PF 1 MG/ML IJ SOLN: 1.0000 mg | INTRAMUSCULAR | Status: DC | PRN

## 2012-03-19 MED ORDER — OXYCODONE-ACETAMINOPHEN 5-325 MG PO TABS
ORAL_TABLET | ORAL | Status: AC
  Administered 2012-03-19: 2 via ORAL
  Filled 2012-03-19: qty 2

## 2012-03-19 MED ORDER — BUPIVACAINE HCL (PF) 0.25 % IJ SOLN
INTRAMUSCULAR | Status: DC | PRN
  Administered 2012-03-19: 18 mL

## 2012-03-19 MED ORDER — HYDROMORPHONE HCL PF 1 MG/ML IJ SOLN
INTRAMUSCULAR | Status: AC
  Filled 2012-03-19: qty 1

## 2012-03-19 MED ORDER — OXYCODONE-ACETAMINOPHEN 5-325 MG PO TABS
1.0000 | ORAL_TABLET | ORAL | Status: DC | PRN
  Administered 2012-03-19 (×2): 2 via ORAL
  Administered 2012-03-20: 1 via ORAL
  Administered 2012-03-20 (×2): 2 via ORAL
  Administered 2012-03-20: 1 via ORAL
  Filled 2012-03-19 (×4): qty 2
  Filled 2012-03-19: qty 1

## 2012-03-19 MED ORDER — DIPHENHYDRAMINE HCL 25 MG PO CAPS
25.0000 mg | ORAL_CAPSULE | Freq: Four times a day (QID) | ORAL | Status: DC | PRN
  Administered 2012-03-20: 25 mg via ORAL
  Filled 2012-03-19: qty 1

## 2012-03-19 MED ORDER — LACTATED RINGERS IV SOLN: INTRAVENOUS | Status: DC

## 2012-03-19 MED ORDER — HYDROMORPHONE HCL PF 1 MG/ML IJ SOLN
0.2500 mg | INTRAMUSCULAR | Status: DC | PRN
  Administered 2012-03-19 (×2): 0.5 mg via INTRAVENOUS

## 2012-03-19 MED ORDER — ACETAMINOPHEN 10 MG/ML IV SOLN
1000.0000 mg | Freq: Once | INTRAVENOUS | Status: AC
  Administered 2012-03-19: 1000 mg via INTRAVENOUS
  Filled 2012-03-19: qty 100

## 2012-03-19 MED ORDER — LACTATED RINGERS IR SOLN
Status: DC | PRN
  Administered 2012-03-19: 3000 mL

## 2012-03-19 MED ORDER — MIDAZOLAM HCL 2 MG/2ML IJ SOLN
INTRAMUSCULAR | Status: AC
  Filled 2012-03-19: qty 2

## 2012-03-19 MED ORDER — ONDANSETRON HCL 4 MG/2ML IJ SOLN
INTRAMUSCULAR | Status: AC
  Filled 2012-03-19: qty 2

## 2012-03-19 MED ORDER — HYDROMORPHONE HCL PF 1 MG/ML IJ SOLN
0.5000 mg | INTRAMUSCULAR | Status: AC
  Administered 2012-03-19: 17:00:00 via INTRAVENOUS
  Administered 2012-03-19 (×3): 0.5 mg via INTRAVENOUS

## 2012-03-19 MED ORDER — HYDROMORPHONE HCL PF 1 MG/ML IJ SOLN
INTRAMUSCULAR | Status: AC
  Administered 2012-03-19: 0.5 mg via INTRAVENOUS
  Filled 2012-03-19: qty 1

## 2012-03-19 MED ORDER — DIPHENHYDRAMINE HCL 50 MG/ML IJ SOLN
25.0000 mg | Freq: Once | INTRAMUSCULAR | Status: AC
  Administered 2012-03-19: 12.5 mg via INTRAVENOUS

## 2012-03-19 MED ORDER — SUFENTANIL CITRATE 50 MCG/ML IV SOLN
INTRAVENOUS | Status: AC
  Filled 2012-03-19: qty 1

## 2012-03-19 MED ORDER — OXYCODONE-ACETAMINOPHEN 7.5-325 MG PO TABS: 1.0000 | ORAL_TABLET | ORAL | Status: DC | PRN

## 2012-03-19 MED ORDER — MORPHINE SULFATE 4 MG/ML IJ SOLN: 2.0000 mg | INTRAMUSCULAR | Status: DC | PRN

## 2012-03-19 MED ORDER — VASOPRESSIN 20 UNIT/ML IJ SOLN
INTRAMUSCULAR | Status: AC
  Filled 2012-03-19: qty 1

## 2012-03-19 MED ORDER — LIDOCAINE HCL (CARDIAC) 20 MG/ML IV SOLN
INTRAVENOUS | Status: AC
  Filled 2012-03-19: qty 5

## 2012-03-19 MED ORDER — ONDANSETRON HCL 4 MG/2ML IJ SOLN
INTRAMUSCULAR | Status: DC | PRN
  Administered 2012-03-19: 4 mg via INTRAVENOUS

## 2012-03-19 MED ORDER — ONDANSETRON 8 MG PO TBDP
8.0000 mg | ORAL_TABLET | Freq: Three times a day (TID) | ORAL | Status: DC | PRN
  Administered 2012-03-19: 8 mg via ORAL
  Filled 2012-03-19: qty 1

## 2012-03-19 MED ORDER — KETOROLAC TROMETHAMINE 30 MG/ML IJ SOLN
15.0000 mg | Freq: Once | INTRAMUSCULAR | Status: AC | PRN
  Administered 2012-03-19: 30 mg via INTRAVENOUS

## 2012-03-19 MED ORDER — FENTANYL CITRATE 0.05 MG/ML IJ SOLN
INTRAMUSCULAR | Status: AC
  Filled 2012-03-19: qty 2

## 2012-03-19 MED ORDER — PROPOFOL 10 MG/ML IV EMUL
INTRAVENOUS | Status: DC | PRN
  Administered 2012-03-19: 200 mg via INTRAVENOUS

## 2012-03-19 MED ORDER — IBUPROFEN 600 MG PO TABS
600.0000 mg | ORAL_TABLET | Freq: Four times a day (QID) | ORAL | Status: DC | PRN
  Administered 2012-03-20 (×2): 600 mg via ORAL
  Filled 2012-03-19 (×2): qty 1

## 2012-03-19 MED ORDER — PROPOFOL 10 MG/ML IV EMUL
INTRAVENOUS | Status: AC
  Filled 2012-03-19: qty 20

## 2012-03-19 MED ORDER — GLYCOPYRROLATE 0.2 MG/ML IJ SOLN
INTRAMUSCULAR | Status: DC | PRN
  Administered 2012-03-19 (×2): 0.4 mg via INTRAVENOUS

## 2012-03-19 MED ORDER — DIPHENHYDRAMINE HCL 50 MG/ML IJ SOLN
INTRAMUSCULAR | Status: AC
  Administered 2012-03-19: 12.5 mg via INTRAVENOUS
  Filled 2012-03-19: qty 1

## 2012-03-19 MED ORDER — EPHEDRINE SULFATE 50 MG/ML IJ SOLN
INTRAMUSCULAR | Status: DC | PRN
  Administered 2012-03-19 (×2): 10 mg via INTRAVENOUS

## 2012-03-19 MED ORDER — BUPIVACAINE HCL (PF) 0.25 % IJ SOLN
INTRAMUSCULAR | Status: AC
  Filled 2012-03-19: qty 30

## 2012-03-19 MED ORDER — LIDOCAINE HCL (CARDIAC) 20 MG/ML IV SOLN
INTRAVENOUS | Status: DC | PRN
  Administered 2012-03-19: 50 mg via INTRAVENOUS

## 2012-03-19 MED ORDER — MORPHINE SULFATE 4 MG/ML IJ SOLN: 1.0000 mg | INTRAMUSCULAR | Status: DC | PRN

## 2012-03-19 MED ORDER — MORPHINE SULFATE 10 MG/ML IJ SOLN
INTRAMUSCULAR | Status: AC
  Administered 2012-03-19: 4 mg
  Filled 2012-03-19: qty 1

## 2012-03-19 MED ORDER — KETOROLAC TROMETHAMINE 30 MG/ML IJ SOLN
INTRAMUSCULAR | Status: AC
  Filled 2012-03-19: qty 1

## 2012-03-19 MED ORDER — FENTANYL CITRATE 0.05 MG/ML IJ SOLN
25.0000 ug | INTRAMUSCULAR | Status: DC | PRN
  Administered 2012-03-19 (×2): 50 ug via INTRAVENOUS

## 2012-03-19 MED ORDER — NEOSTIGMINE METHYLSULFATE 1 MG/ML IJ SOLN
INTRAMUSCULAR | Status: DC | PRN
  Administered 2012-03-19 (×2): 2 mg via INTRAVENOUS

## 2012-03-19 SURGICAL SUPPLY — 66 items
ADH SKN CLS APL DERMABOND .7 (GAUZE/BANDAGES/DRESSINGS) ×2
BAG URINE DRAINAGE (UROLOGICAL SUPPLIES) ×3 IMPLANT
BARRIER ADHS 3X4 INTERCEED (GAUZE/BANDAGES/DRESSINGS) ×3 IMPLANT
BLADE LAPAROSCOPIC MORCELL KIT (BLADE) IMPLANT
BRR ADH 4X3 ABS CNTRL BYND (GAUZE/BANDAGES/DRESSINGS) ×2
CABLE HIGH FREQUENCY MONO STRZ (ELECTRODE) ×3 IMPLANT
CATH FOLEY 3WAY  5CC 16FR (CATHETERS) ×1
CATH FOLEY 3WAY 5CC 16FR (CATHETERS) ×2 IMPLANT
CLOTH BEACON ORANGE TIMEOUT ST (SAFETY) ×3 IMPLANT
CONT PATH 16OZ SNAP LID 3702 (MISCELLANEOUS) ×3 IMPLANT
COVER MAYO STAND STRL (DRAPES) ×3 IMPLANT
COVER TABLE BACK 60X90 (DRAPES) ×6 IMPLANT
COVER TIP SHEARS 8 DVNC (MISCELLANEOUS) ×2 IMPLANT
COVER TIP SHEARS 8MM DA VINCI (MISCELLANEOUS) ×1
DECANTER SPIKE VIAL GLASS SM (MISCELLANEOUS) ×3 IMPLANT
DERMABOND ADVANCED (GAUZE/BANDAGES/DRESSINGS) ×1
DERMABOND ADVANCED .7 DNX12 (GAUZE/BANDAGES/DRESSINGS) ×3 IMPLANT
DRAPE HUG U DISPOSABLE (DRAPE) ×3 IMPLANT
DRAPE LG THREE QUARTER DISP (DRAPES) ×6 IMPLANT
DRAPE MONITOR DA VINCI (DRAPE) IMPLANT
DRAPE WARM FLUID 44X44 (DRAPE) ×3 IMPLANT
ELECT REM PT RETURN 9FT ADLT (ELECTROSURGICAL) ×3
ELECTRODE REM PT RTRN 9FT ADLT (ELECTROSURGICAL) ×2 IMPLANT
EVACUATOR SMOKE 8.L (FILTER) ×3 IMPLANT
GAUZE VASELINE 3X9 (GAUZE/BANDAGES/DRESSINGS) IMPLANT
GLOVE BIO SURGEON STRL SZ 6.5 (GLOVE) ×6 IMPLANT
GLOVE BIO SURGEON STRL SZ7 (GLOVE) ×6 IMPLANT
GOWN STRL REIN XL XLG (GOWN DISPOSABLE) ×18 IMPLANT
IV STOPCOCK 4 WAY 40  W/Y SET (IV SOLUTION)
IV STOPCOCK 4 WAY 40 W/Y SET (IV SOLUTION) IMPLANT
KIT ACCESSORY DA VINCI DISP (KITS) ×1
KIT ACCESSORY DVNC DISP (KITS) ×2 IMPLANT
KIT DISP ACCESSORY 4 ARM (KITS) ×3 IMPLANT
NEEDLE HYPO 22GX1.5 SAFETY (NEEDLE) ×3 IMPLANT
OCCLUDER COLPOPNEUMO (BALLOONS) ×3 IMPLANT
PACK LAVH (CUSTOM PROCEDURE TRAY) ×3 IMPLANT
PAD PREP 24X48 CUFFED NSTRL (MISCELLANEOUS) ×6 IMPLANT
PLUG CATH AND CAP STER (CATHETERS) ×3 IMPLANT
PROTECTOR NERVE ULNAR (MISCELLANEOUS) ×6 IMPLANT
SET CYSTO W/LG BORE CLAMP LF (SET/KITS/TRAYS/PACK) IMPLANT
SET IRRIG TUBING LAPAROSCOPIC (IRRIGATION / IRRIGATOR) ×3 IMPLANT
SOLUTION ELECTROLUBE (MISCELLANEOUS) ×3 IMPLANT
SPONGE LAP 18X18 X RAY DECT (DISPOSABLE) IMPLANT
SUT VIC AB 0 CT1 27 (SUTURE)
SUT VIC AB 0 CT1 27XBRD ANTBC (SUTURE) IMPLANT
SUT VIC AB 4-0 PS2 27 (SUTURE) ×6 IMPLANT
SUT VICRYL 0 27 CT2 27 ABS (SUTURE) IMPLANT
SUT VICRYL 0 UR6 27IN ABS (SUTURE) ×6 IMPLANT
SUT VLOC 180 0 9IN  GS21 (SUTURE) ×1
SUT VLOC 180 0 9IN GS21 (SUTURE) ×2 IMPLANT
SYR 50ML LL SCALE MARK (SYRINGE) ×3 IMPLANT
SYSTEM CONVERTIBLE TROCAR (TROCAR) ×3 IMPLANT
TIP UTERINE 5.1X6CM LAV DISP (MISCELLANEOUS) IMPLANT
TIP UTERINE 6.7X10CM GRN DISP (MISCELLANEOUS) ×2 IMPLANT
TIP UTERINE 6.7X6CM WHT DISP (MISCELLANEOUS) IMPLANT
TIP UTERINE 6.7X8CM BLUE DISP (MISCELLANEOUS) IMPLANT
TOWEL OR 17X24 6PK STRL BLUE (TOWEL DISPOSABLE) ×9 IMPLANT
TROCAR 12M 150ML BLUNT (TROCAR) IMPLANT
TROCAR 5M 150ML BLDLS (TROCAR) ×3 IMPLANT
TROCAR DISP BLADELESS 8 DVNC (TROCAR) ×2 IMPLANT
TROCAR DISP BLADELESS 8MM (TROCAR) ×1
TROCAR XCEL 12X100 BLDLESS (ENDOMECHANICALS) ×3 IMPLANT
TROCAR XCEL NON-BLD 5MMX100MML (ENDOMECHANICALS) ×3 IMPLANT
TUBING FILTER THERMOFLATOR (ELECTROSURGICAL) ×4 IMPLANT
WARMER LAPAROSCOPE (MISCELLANEOUS) ×3 IMPLANT
WATER STERILE IRR 1000ML POUR (IV SOLUTION) ×9 IMPLANT

## 2012-03-19 NOTE — Op Note (Signed)
03/19/2012  3:50 PM  PATIENT:  [name]  36 y.o. female  PRE-OPERATIVE DIAGNOSIS:  Menorrhagia,  Pelvic Pain  POST-OPERATIVE DIAGNOSIS:  Menorrhagia, Pelvic Pain, Moderate pelvic endometriosis  PROCEDURE:  Procedure(s): ROBOTIC ASSISTED TOTAL HYSTERECTOMY INTRAUTERINE DEVICE (IUD) REMOVAL ROBOTIC ASSISTED LAPAROSCOPIC LYSIS OF ADHESIONS, RESECTION AND CAUTHERIZATION OF ENDOMETRIOTIC LESIONS  SURGEON:  Surgeon(s): Genia Del, MD Alphonsus Sias. Ernestina Penna, MD Alphonsus Sias Ernestina Penna, MD Robley Fries, MD  ASSISTANTS: AS ABOVE   ANESTHESIA:   general  PROCEDURE: Under general anesthesia with endotracheal intubation the patient is in lithotomy.  She is prepped with ChloraPrep on the abdomen and Betadine on the suprapubic vulvar and vaginal areas.  She is draped as usual.  The vaginal exam reveals an anteverted uterus slightly enlarged about 10 cm no adnexal mass. The small Koe Ring with the roomy #10 are inserted in the uterus without difficulty.  The Foley is inserted in the bladder. Abdominally we make a supraumbilical incision with the scalpel over with 1.2 cm after infiltrating with Marcaine one quarter plain. The aponeurosis is opened the with Mayo scissors under direct vision. The parietal peritoneum is opened bluntly with a finger.  A pursestring stitch of Vicryl 0 is done on the aponeurosis. The Roseanne Reno is inserted with the camera at that level after creating a pneumoperitoneum with CO2. A semicircular configuration is used for port placement. We placed 2 robotic ports on the right and one robotic port on the upper left with the assistant port on the lower left. All ports were inserted under direct vision, the assistant port was a 5 mm.  The robot was docked from the right side. Instruments were put in place including in the Endo Shears scissor the PK and the fenestrated clamp.  We started on the left side cauterizing and sectioning the left round ligament the left tube and the left  utero-ovarian ligament. We did the same on the right side. We then opened the visceral peritoneum and lower the bladder downwards passed the core ring. We then cauterized and section the uterine arteries on each side. We opened the vagina on top of the Koe-ring with the point of the Endo Shears scissor. The uterus was passed vaginally and sent to pathology.  Hemostasis was good at all levels.  We then closed the vaginal vault with V. LOCK, from the left to right angle and coming back to about half way to secure the stitch. All bites included the vaginal mucosa. Hemostasis was adequate at that level as well. We then proceeded with resection and cauterization of endometriotic lesions that were scattered on the the peritoneum at the left, anterior and right abdominal and pelvic walls.  We also proceeded with lysis of adhesions and cauterization of endometriosis involving the upper aspect of the left ovary and the bowels.  Pictures were taken before and after the intervention. We then removed all instruments, undocked the robot.  We removed the V. LOCK needle by laparoscopy.  We irrigated and suctioned the abdominopelvic cavities. We inserted Interceed and applied it where lysis of adhesions was done and on the vaginal vault.  We then removed all laparoscopy instruments, removed the trochars under direct vision.  The supraumbilical incision was closed at the aponeurosis by attaching the pursestring stitch. All incisions were closed with a subcuticular Vicryl 4-0 and Dermabond.  The vaginal instruments were removed. The patient was brought to recovery room in good and stable status.  ESTIMATED BLOOD LOSS:  100 CC   Intake/Output Summary (Last  24 hours) at 03/19/12 1550 Last data filed at 03/19/12 1530  Gross per 24 hour  Intake   1000 ml  Output    300 ml  Net    700 ml     BLOOD ADMINISTERED:none   LOCAL MEDICATIONS USED:  MARCAINE     SPECIMEN:  Source of Specimen:  Uterus with cervix and Endometriotic  lesions  DISPOSITION OF SPECIMEN:  PATHOLOGY  COUNTS:  YES  PLAN OF CARE: Transfer to PACU    Genia Del  MD  03/19/2012 at 3:53 pm

## 2012-03-19 NOTE — Anesthesia Procedure Notes (Signed)
Procedure Name: Intubation Date/Time: 03/19/2012 1:33 PM Performed by: Shanon Payor Pre-anesthesia Checklist: Patient identified, Emergency Drugs available, Suction available, Timeout performed and Patient being monitored Patient Re-evaluated:Patient Re-evaluated prior to inductionOxygen Delivery Method: Circle system utilized Preoxygenation: Pre-oxygenation with 100% oxygen Intubation Type: IV induction Ventilation: Mask ventilation without difficulty Laryngoscope Size: Mac and 3 Grade View: Grade III Tube type: Oral Tube size: 7.0 mm Number of attempts: 2 Airway Equipment and Method: Stylet and Video-laryngoscopy Placement Confirmation: ETT inserted through vocal cords under direct vision,  positive ETCO2 and breath sounds checked- equal and bilateral Secured at: 22 cm Tube secured with: Tape Dental Injury: Teeth and Oropharynx as per pre-operative assessment  Comments: DL with MAC 3 poor view, DL with glidescope with good view, intubated atraumatically.

## 2012-03-19 NOTE — Progress Notes (Signed)
C/o itching.  Dr Rodman Pickle notified and order received for Benadryl IV.

## 2012-03-19 NOTE — Progress Notes (Signed)
Dr Rodman Pickle notified of pt's pain level(8) despite having had several IV doses of pain meds.  May have Toradol if okay with Dr Seymour Bars.  Dr Seymour Bars called and stated it was okay to give patient toradol.  Toradol 30 mg IV given.

## 2012-03-19 NOTE — Anesthesia Postprocedure Evaluation (Signed)
Anesthesia Post Note  Patient: [name]  Procedure(s) Performed: Procedure(s) (LRB): ROBOTIC ASSISTED TOTAL HYSTERECTOMY (N/A) INTRAUTERINE DEVICE (IUD) REMOVAL (N/A) ROBOTIC ASSISTED LAPAROSCOPIC LYSIS OF ADHESION (N/A)  Anesthesia type: General  Patient location: PACU  Post pain: Pain level controlled  Post assessment: Post-op Vital signs reviewed  Last Vitals:  Filed Vitals:   03/19/12 1815  BP: 167/100  Pulse: 110  Temp:   Resp:     Post vital signs: Reviewed  Level of consciousness: sedated  Complications: No apparent anesthesia complications

## 2012-03-19 NOTE — Progress Notes (Signed)
Message left on Dr Sharol Roussel pager number.  Awaiting callback.

## 2012-03-19 NOTE — H&P (Signed)
[name] is an 36 y.o. female G1P1  C/S x 1  RP:  Severe menometro for [facility]  Pertinent Gynecological History: Menses: flow is excessive with use of many pads or tampons on heaviest days Bleeding: intermenstrual bleeding Contraception: condoms Blood transfusions: none Sexually transmitted diseases: no past history Previous GYN Procedures: DNC  Last mammogram: none  Last pap: normal  OB History: G1P1  Menstrual History:  No LMP recorded.    Past Medical History  Diagnosis Date  . Asthma   . Diabetes mellitus   . Hypertension   . PCOS (polycystic ovarian syndrome)   . Gastroparesis   . Polyneuropathy   . Multiple sclerosis     Dr. Anne Shields    Past Surgical History  Procedure Date  . Cholecystectomy   . Dilation and curettage of uterus   . Laparoscopic gastric banding 01/26/08  . Cesarean section     Family History  Problem Relation Age of Onset  . Diabetes Mother   . Depression Mother   . Hypertension Mother   . Liver disease Father   . Depression Daughter     bipolar depression  . Coronary artery disease Other   . Cancer Other     ovarian    Social History:  reports that she has never smoked. She does not have any smokeless tobacco history on file. She reports that she does not drink alcohol or use illicit drugs.  Allergies: No Known Allergies  Prescriptions prior to admission  Medication Sig Dispense Refill  . GILENYA 0.5 MG CAPS Take 1 tablet by mouth daily.      [name] losartan (COZAAR) 50 MG tablet Take 1 tablet (50 mg total) by mouth daily.  90 tablet  1  . albuterol (PROVENTIL) (2.5 MG/3ML) 0.083% nebulizer solution Take 2.5 mg by nebulization as needed. Uses about twice/year for allergies.  Only has nebulizer not MDI.      . cholecalciferol (VITAMIN D) 1000 UNITS tablet Take 1,000 Units by mouth daily.      [name] glucose blood (FREESTYLE LITE) test strip Test once daily.  100 each  12  . HYDROcodone-acetaminophen (NORCO) 7.5-325 MG per tablet Take 1  tablet by mouth 6 (six) times daily. cramping  60 tablet  0  . ibuprofen (ADVIL,MOTRIN) 800 MG tablet Take 1 tablet by mouth Every 6 hours.      . metFORMIN (GLUCOPHAGE XR) 500 MG 24 hr tablet Take 1 tablet (500 mg total) by mouth 2 (two) times daily.  60 tablet  1  . norethindrone (AYGESTIN) 5 MG tablet Take 1 tablet by mouth 3 (three) times daily.       . pregabalin (LYRICA) 100 MG capsule Take 100 mg by mouth 3 (three) times daily.        . sitaGLIPtin (JANUVIA) 100 MG tablet Take 1 tablet (100 mg total) by mouth daily.  30 tablet  1    Blood pressure 135/99, pulse 95, temperature 99 F (37.2 C), temperature source Oral, resp. rate 18, SpO2 98.00%.  Pelvic US and pelvic MRI  Normal size uterus.  Results for orders placed during the hospital encounter of 03/19/12 (from the past 24 hour(s))  PREGNANCY, URINE     Status: Normal   Collection Time   03/19/12 12:00 PM      Component Value Range   Preg Test, Ur NEGATIVE  NEGATIVE   TYPE AND SCREEN     Status: Normal (Preliminary result)   Collection Time   03/19/12 12:00  PM      Component Value Range   ABO/RH(D) O NEG     Antibody Screen PENDING     Sample Expiration 03/22/2012    GLUCOSE, CAPILLARY     Status: Normal   Collection Time   03/19/12 12:13 PM      Component Value Range   Glucose-Capillary 89  70 - 99 (mg/dL)    No results found.  Assessment/Plan:  Severe menometrorrhagia resistant to medical treatment with pelvic pains.  Adenomyosis?                                 H/o Lap Band and C/S.  H/O MS.                                 [employer], building services.  Surgery and risks reviewed.  [name],[name] 03/19/2012, 1:07 PM

## 2012-03-19 NOTE — Transfer of Care (Signed)
Immediate Anesthesia Transfer of Care Note  Patient: [name]  Procedure(s) Performed: Procedure(s) (LRB): ROBOTIC ASSISTED TOTAL HYSTERECTOMY (N/A) INTRAUTERINE DEVICE (IUD) REMOVAL (N/A) ROBOTIC ASSISTED LAPAROSCOPIC LYSIS OF ADHESION (N/A)  Patient Location: PACU  Anesthesia Type: General  Level of Consciousness: awake, alert  and oriented  Airway & Oxygen Therapy: Patient Spontanous Breathing and Patient connected to nasal cannula oxygen  Post-op Assessment: Report given to PACU RN and Post -op Vital signs reviewed and stable  Post vital signs: Reviewed and stable  Complications: No apparent anesthesia complications

## 2012-03-19 NOTE — Discharge Instructions (Signed)
POST-OPERATIVE INSTRUCTIONS TO PATIENT  Call [facility]  for excessive pain, bleeding or temperature greater than or equal to 100.4 degrees (orally).    No driving for 1 week No lifting (more than 20 lbs) for 3 weeks No sexual activity for 8 weeks  Pain management: Use Ibuprofen 600 mg every 6 hours for 5 days and then as needed.           Use your pain medication as needed to maintain a pain level at or            below 3/10             Use Colace 1-2 capsules per day as long as you are using pain            medication to avoid constipation.       Diet: normal  Bathing: may shower day after surgery  Wound Care: keep incisions clean and dry  Return to Dr. Seymour Bars in 3 weeks  Return to work: To be determined at post operative visit.    [name],[name] MD 03/19/2012 4:10 PM

## 2012-03-19 NOTE — Anesthesia Preprocedure Evaluation (Signed)
Anesthesia Evaluation  Patient identified by MRN, date of birth, ID band Patient awake    Reviewed: Allergy & Precautions, H&P , NPO status , Patient's Chart, lab work & pertinent test results, reviewed documented beta blocker date and time   History of Anesthesia Complications Negative for: history of anesthetic complications  Airway Mallampati: II TM Distance: >3 FB Neck ROM: full    Dental  (+) Teeth Intact   Pulmonary  breath sounds clear to auscultation  Pulmonary exam normal       Cardiovascular Exercise Tolerance: Good hypertension (135/99 today), On Medications Rhythm:regular Rate:Normal     Neuro/Psych Had blindness from a med for MS called Tysabri, resolved after a month.  Continued occasional blurriness  Neuromuscular disease (multiple sclerosis - leg weakness and spasms, no symptoms now) negative psych ROS   GI/Hepatic negative GI ROS, NASH or fatty liver, LFTS only mildly elevated   Endo/Other  Diabetes mellitus-, Type 2, Oral Hypoglycemic AgentsMorbid obesity  Renal/GU negative Renal ROS  negative genitourinary   Musculoskeletal   Abdominal   Peds  Hematology negative hematology ROS (+)   Anesthesia Other Findings   Reproductive/Obstetrics negative OB ROS                           Anesthesia Physical Anesthesia Plan  ASA: III  Anesthesia Plan: General ETT   Post-op Pain Management:    Induction:   Airway Management Planned:   Additional Equipment:   Intra-op Plan:   Post-operative Plan:   Informed Consent: I have reviewed the patients History and Physical, chart, labs and discussed the procedure including the risks, benefits and alternatives for the proposed anesthesia with the patient or authorized representative who has indicated his/her understanding and acceptance.   Dental Advisory Given  Plan Discussed with: CRNA and Surgeon  Anesthesia Plan Comments:           Anesthesia Quick Evaluation

## 2012-03-19 NOTE — Discharge Summary (Signed)
  Physician Discharge Summary  Patient ID: Kerria Sapien MRN: [id number] DOB/AGE: [birth year] 36 y.o.  Admit date: 03/19/2012 Discharge date: 03/19/2012  Admission Diagnoses: Menorrhagia, Myomas, Pelvic Pain  Discharge Diagnoses: Menorrhagia, Myomas, Pelvic Pain        Active Problems:  * No active hospital problems. *    Discharged Condition: good  Hospital Course: Good  Consults: None  Treatments: surgery: Robotic total laparoscopic hysterectomy with lysis of adhesions and resection/cautherization of endometriosis  Disposition: 01-Home or Self Care  Discharge Orders    Future Appointments: Provider: Department: Dept Phone: Center:   04/21/2012 3:00 PM Doe-Hyun Sherran Needs, DO [facility] [phone] [facility]     Medication List  As of 03/19/2012  4:11 PM   TAKE these medications         albuterol (2.5 MG/3ML) 0.083% nebulizer solution   Commonly known as: PROVENTIL   Take 2.5 mg by nebulization as needed. Uses about twice/year for allergies.  Only has nebulizer not MDI.      cholecalciferol 1000 UNITS tablet   Commonly known as: VITAMIN D   Take 1,000 Units by mouth daily.      GILENYA 0.5 MG Caps   Generic drug: Fingolimod HCl   Take 1 tablet by mouth daily.      glucose blood test strip   Test once daily.      HYDROcodone-acetaminophen 7.5-325 MG per tablet   Commonly known as: NORCO   Take 1 tablet by mouth 6 (six) times daily. cramping      ibuprofen 800 MG tablet   Commonly known as: ADVIL,MOTRIN   Take 1 tablet by mouth Every 6 hours.      losartan 50 MG tablet   Commonly known as: COZAAR   Take 1 tablet (50 mg total) by mouth daily.      metFORMIN 500 MG 24 hr tablet   Commonly known as: GLUCOPHAGE-XR   Take 1 tablet (500 mg total) by mouth 2 (two) times daily.      norethindrone 5 MG tablet   Commonly known as: AYGESTIN   Take 1 tablet by mouth 3 (three) times daily.      oxyCODONE-acetaminophen 7.5-325 MG per tablet   Commonly known as: PERCOCET     Take 1 tablet by mouth every 4 (four) hours as needed for pain.      pregabalin 100 MG capsule   Commonly known as: LYRICA   Take 100 mg by mouth 3 (three) times daily.      sitaGLIPtin 100 MG tablet   Commonly known as: JANUVIA   Take 1 tablet (100 mg total) by mouth daily.           Follow-up Information    Follow up with Myonna Chisom,[name], MD in 3 weeks.   Contact information:   [address] [place] [address] [phone]          SignedGenia Del, MD 03/19/2012, 4:11 PM

## 2012-03-19 NOTE — Progress Notes (Signed)
Dr Rodman Pickle called and updated on pt's pain level.  Order received to give additional Dilaudid.

## 2012-03-20 ENCOUNTER — Encounter (HOSPITAL_COMMUNITY): Payer: Self-pay | Admitting: Obstetrics and Gynecology

## 2012-03-20 ENCOUNTER — Other Ambulatory Visit (HOSPITAL_COMMUNITY): Payer: BC Managed Care – PPO

## 2012-03-20 LAB — CBC
HCT: 38.5 % (ref 36.0–46.0)
Hemoglobin: 12.3 g/dL (ref 12.0–15.0)
MCH: 29.9 pg (ref 26.0–34.0)
MCHC: 31.9 g/dL (ref 30.0–36.0)
MCV: 93.7 fL (ref 78.0–100.0)
Platelets: 381 10*3/uL (ref 150–400)
RBC: 4.11 MIL/uL (ref 3.87–5.11)
RDW: 12.6 % (ref 11.5–15.5)
WBC: 9.4 10*3/uL (ref 4.0–10.5)

## 2012-03-20 NOTE — Progress Notes (Signed)
1 Day Post-Op Procedure(s) (LRB): ROBOTIC ASSISTED TOTAL HYSTERECTOMY (N/A) INTRAUTERINE DEVICE (IUD) REMOVAL (N/A) ROBOTIC ASSISTED LAPAROSCOPIC LYSIS OF ADHESION (N/A) AND RESECTION/CAUTHERIZATION OF ENDOMETRIOSIS  Subjective: Patient reports that pain is well managed.  Tolerating normal diet as tolerated  diet without difficulty. No nausea / vomiting.  Ambulating and voiding.  Objective: BP 132/89  Pulse 95  Temp(Src) 98.4 F (36.9 C) (Oral)  Resp 18  Ht 5\' 3"  (1.6 m)  Wt 121.564 kg (268 lb)  BMI 47.47 kg/m2  SpO2 95% Lungs: clear Heart: normal rate and rhythm Abdomen:soft and appropriately tender Extremities: Homans sign is negative, no sign of DVT Incision: healing well  Assessment: s/p Procedure(s): ROBOTIC ASSISTED TOTAL HYSTERECTOMY INTRAUTERINE DEVICE (IUD) REMOVAL ROBOTIC ASSISTED LAPAROSCOPIC LYSIS OF ADHESION: progressing well  Plan: Discharge home  LOS: 1 day    [name],[name] 03/20/2012, 12:58 PM

## 2012-03-25 ENCOUNTER — Telehealth: Payer: Self-pay | Admitting: *Deleted

## 2012-03-25 ENCOUNTER — Encounter (HOSPITAL_COMMUNITY): Payer: Self-pay | Admitting: *Deleted

## 2012-03-25 ENCOUNTER — Inpatient Hospital Stay (HOSPITAL_COMMUNITY)
Admission: AD | Admit: 2012-03-25 | Discharge: 2012-03-26 | Disposition: A | Discharge status: Home / Self Care | Payer: BC Managed Care – PPO | Source: Ambulatory Visit | Attending: Obstetrics & Gynecology | Admitting: Obstetrics & Gynecology

## 2012-03-25 ENCOUNTER — Encounter (HOSPITAL_COMMUNITY): Payer: Self-pay | Admitting: Emergency Medicine

## 2012-03-25 ENCOUNTER — Inpatient Hospital Stay (HOSPITAL_COMMUNITY): Payer: BC Managed Care – PPO

## 2012-03-25 ENCOUNTER — Emergency Department (HOSPITAL_COMMUNITY)
Admission: EM | Admit: 2012-03-25 | Discharge: 2012-03-25 | Disposition: A | Discharge status: Home / Self Care | Payer: BC Managed Care – PPO

## 2012-03-25 DIAGNOSIS — R197 Diarrhea, unspecified: Secondary | ICD-10-CM | POA: Insufficient documentation

## 2012-03-25 DIAGNOSIS — A088 Other specified intestinal infections: Secondary | ICD-10-CM | POA: Insufficient documentation

## 2012-03-25 LAB — CBC
HCT: 41.3 % (ref 36.0–46.0)
Hemoglobin: 13.9 g/dL (ref 12.0–15.0)
MCH: 30.3 pg (ref 26.0–34.0)
MCHC: 33.7 g/dL (ref 30.0–36.0)
MCV: 90 fL (ref 78.0–100.0)
Platelets: 369 10*3/uL (ref 150–400)
RBC: 4.59 MIL/uL (ref 3.87–5.11)
RDW: 12.7 % (ref 11.5–15.5)
WBC: 6.8 10*3/uL (ref 4.0–10.5)

## 2012-03-25 LAB — URINALYSIS, ROUTINE W REFLEX MICROSCOPIC
Bilirubin Urine: NEGATIVE
Glucose, UA: NEGATIVE mg/dL
Ketones, ur: NEGATIVE mg/dL
Leukocytes, UA: NEGATIVE
Nitrite: NEGATIVE
Protein, ur: NEGATIVE mg/dL
Specific Gravity, Urine: 1.02 (ref 1.005–1.030)
Urobilinogen, UA: 0.2 mg/dL (ref 0.0–1.0)
pH: 6 (ref 5.0–8.0)

## 2012-03-25 LAB — URINE MICROSCOPIC-ADD ON

## 2012-03-25 MED ORDER — HYDROMORPHONE HCL PF 1 MG/ML IJ SOLN
1.0000 mg | Freq: Once | INTRAMUSCULAR | Status: AC
  Administered 2012-03-25: 1 mg via INTRAMUSCULAR
  Filled 2012-03-25: qty 1

## 2012-03-25 MED ORDER — PROMETHAZINE HCL 25 MG PO TABS: 25.0000 mg | ORAL_TABLET | Freq: Four times a day (QID) | ORAL | Status: DC | PRN

## 2012-03-25 MED ORDER — SODIUM CHLORIDE 0.9 % IV SOLN
25.0000 mg | Freq: Once | INTRAVENOUS | Status: AC
  Administered 2012-03-25: 25 mg via INTRAVENOUS
  Filled 2012-03-25: qty 1

## 2012-03-25 MED ORDER — LACTATED RINGERS IV BOLUS (SEPSIS)
500.0000 mL | Freq: Once | INTRAVENOUS | Status: AC
  Administered 2012-03-25: 500 mL via INTRAVENOUS

## 2012-03-25 MED ORDER — OXYCODONE-ACETAMINOPHEN 5-325 MG PO TABS
2.0000 | ORAL_TABLET | Freq: Once | ORAL | Status: AC
  Administered 2012-03-25: 2 via ORAL
  Filled 2012-03-25: qty 2

## 2012-03-25 NOTE — Progress Notes (Signed)
Dr. Seymour Bars notified patient presents with complaints of n/v and diarrhea x9 times today. Unable to keep any of her meds down. Shortness of breath and painful to deep breath. Pelvic pain has returned today as well. Orders for CBC and urinalysis received.

## 2012-03-25 NOTE — MAU Note (Signed)
Patient states it hurts to take deep breaths and coughing all last night. Lungs clear. SpO2 96-99% on room air

## 2012-03-25 NOTE — Discharge Instructions (Signed)
Viral Gastroenteritis Viral gastroenteritis is also known as stomach flu. This condition affects the stomach and intestinal tract. It can cause sudden diarrhea and vomiting. The illness typically lasts 3 to 8 days. Most people develop an immune response that eventually gets rid of the virus. While this natural response develops, the virus can make you quite ill. CAUSES  Many different viruses can cause gastroenteritis, such as rotavirus or noroviruses. You can catch one of these viruses by consuming contaminated food or water. You may also catch a virus by sharing utensils or other personal items with an infected person or by touching a contaminated surface. SYMPTOMS  The most common symptoms are diarrhea and vomiting. These problems can cause a severe loss of body fluids (dehydration) and a body salt (electrolyte) imbalance. Other symptoms may include:  Fever.   Headache.   Fatigue.   Abdominal pain.  DIAGNOSIS  Your caregiver can usually diagnose viral gastroenteritis based on your symptoms and a physical exam. A stool sample may also be taken to test for the presence of viruses or other infections. TREATMENT  This illness typically goes away on its own. Treatments are aimed at rehydration. The most serious cases of viral gastroenteritis involve vomiting so severely that you are not able to keep fluids down. In these cases, fluids must be given through an intravenous line (IV). HOME CARE INSTRUCTIONS   Drink enough fluids to keep your urine clear or pale yellow. Drink small amounts of fluids frequently and increase the amounts as tolerated.   Ask your caregiver for specific rehydration instructions.   Avoid:   Foods high in sugar.   Alcohol.   Carbonated drinks.   Tobacco.   Juice.   Caffeine drinks.   Extremely hot or cold fluids.   Fatty, greasy foods.   Too much intake of anything at one time.   Dairy products until 24 to 48 hours after diarrhea stops.   You may  consume probiotics. Probiotics are active cultures of beneficial bacteria. They may lessen the amount and number of diarrheal stools in adults. Probiotics can be found in yogurt with active cultures and in supplements.   Wash your hands well to avoid spreading the virus.   Only take over-the-counter or prescription medicines for pain, discomfort, or fever as directed by your caregiver. Do not give aspirin to children. Antidiarrheal medicines are not recommended.   Ask your caregiver if you should continue to take your regular prescribed and over-the-counter medicines.   Keep all follow-up appointments as directed by your caregiver.  SEEK IMMEDIATE MEDICAL CARE IF:   You are unable to keep fluids down.   You do not urinate at least once every 6 to 8 hours.   You develop shortness of breath.   You notice blood in your stool or vomit. This may look like coffee grounds.   You have abdominal pain that increases or is concentrated in one small area (localized).   You have persistent vomiting or diarrhea.   You have a fever.   The patient is a child younger than 3 months, and he or she has a fever.   The patient is a child older than 3 months, and he or she has a fever and persistent symptoms.   The patient is a child older than 3 months, and he or she has a fever and symptoms suddenly get worse.   The patient is a baby, and he or she has no tears when crying.  MAKE SURE YOU:     Understand these instructions.   Will watch your condition.   Will get help right away if you are not doing well or get worse.  Document Released: 11/19/2005 Document Revised: 11/08/2011 Document Reviewed: 09/05/2011 [employer] Patient Information 2012 [employer]. 

## 2012-03-25 NOTE — Telephone Encounter (Signed)
ok 

## 2012-03-25 NOTE — MAU Note (Signed)
Patient states she had a hysterectomy on 4-17 done by Dr. Seymour Bars with the [facility] robot. States she started to feel bad a couple of days ago. Has had fever, diarrhea, weakness, shortness of breath and has been vomiting. Has been unable to keep her meds down.

## 2012-03-25 NOTE — Telephone Encounter (Signed)
Pt calls stating she had a hysterectomy last week and the doctors told her she may have some sore throat and a feeling of irritation from the tubes that were in her throat.  She is having minimal SOB with chest congestion and productive cough.  Cannot get here today, but scheduled tomorrow by Nelva Bush with [facility].  I informed her to go straight to the ER with any increased SOB or chest pain.  She assured me she would.  No fever, chills or feeling ill.

## 2012-03-25 NOTE — MAU Note (Signed)
Sharp abdominal pain started today. Vomiting and diarrhea x9 times today. Unable to keep BP meds, motrin, MS medication down.

## 2012-03-25 NOTE — ED Notes (Signed)
Pt here with c/o low abd pain , vomiting and sob , pt had a total  Hysterectomy on 03/19/12 she called the surgeon who told her to come to the Ed

## 2012-03-25 NOTE — Telephone Encounter (Signed)
Pt call back and decided to go to ER today

## 2012-03-25 NOTE — ED Provider Notes (Signed)
History  [name] 36 y.o.    RP:  Vomiting and diarrhea today.    HPI:  Was progressing very well from [facility] 6 days ago.  This am started vomiting with diarrhea, no blood.  No diarrhea x 4 pm, but not able to keep PO food or medication.  Worried about medication as has cHTN and MS.   Past Medical History  Diagnosis Date  . Asthma   . Diabetes mellitus   . Hypertension   . PCOS (polycystic ovarian syndrome)   . Gastroparesis   . Polyneuropathy   . Multiple sclerosis     Dr. Anne Hahn    Past Surgical History  Procedure Date  . Cholecystectomy   . Dilation and curettage of uterus   . Laparoscopic gastric banding 01/26/08  . Cesarean section   . Iud removal 03/19/2012    Procedure: INTRAUTERINE DEVICE (IUD) REMOVAL;  Surgeon: Genia Del, MD;  Location: [facility];  Service: Gynecology;  Laterality: N/A;  . Colon surgery   . Abdominal hysterectomy     Family History  Problem Relation Age of Onset  . Diabetes Mother   . Depression Mother   . Hypertension Mother   . Liver disease Father   . Depression Daughter     bipolar depression  . Coronary artery disease Other   . Cancer Other     ovarian    History  Substance Use Topics  . Smoking status: Never Smoker   . Smokeless tobacco: Not on file  . Alcohol Use: No    Allergies: No Known Allergies  Prescriptions prior to admission  Medication Sig Dispense Refill  . albuterol (PROVENTIL) (2.5 MG/3ML) 0.083% nebulizer solution Take 2.5 mg by nebulization as needed. Uses about twice/year for asthma.  Only has nebulizer not MDI.      . cholecalciferol (VITAMIN D) 1000 UNITS tablet Take 1,000 Units by mouth daily.      [name] GILENYA 0.5 MG CAPS Take 1 tablet by mouth daily.      [name] ibuprofen (ADVIL,MOTRIN) 200 MG tablet Take 200 mg by mouth every 6 (six) hours as needed. For pain.      [name] losartan (COZAAR) 50 MG tablet Take 1 tablet (50 mg total) by mouth daily.  90 tablet  1  . metFORMIN (GLUCOPHAGE XR) 500 MG 24 hr  tablet Take 1 tablet (500 mg total) by mouth 2 (two) times daily.  60 tablet  1  . oxyCODONE-acetaminophen (PERCOCET) 7.5-325 MG per tablet Take 1 tablet by mouth every 4 (four) hours as needed for pain.  30 tablet  0  . pregabalin (LYRICA) 100 MG capsule Take 100 mg by mouth 3 (three) times daily.        . sitaGLIPtin (JANUVIA) 100 MG tablet Take 1 tablet (100 mg total) by mouth daily.  30 tablet  1   Physical Exam   Blood pressure 156/102, pulse 99, resp. rate 22, height 5\' 4"  (1.626 m), weight 114.306 kg (252 lb), last menstrual period 03/17/2012, SpO2 96.00%.  Lungs clear RCR Abdo soft, non-tender, BS pos           Incisions intact No vaginal bleeding  ED Course  CBC wnl U/A neg  A/P  Probable viral GE with vomiting x this am, diarrhea resolved x 4 pm.         POD 6  [facility] [employer], no evidence of surgical Cx         Will rehydrate IV with Phenergan.  Pelvic US.         Phenergan PO Rx at d/c when PO tolerated.  Genia Del MD 03/25/2012 at 9:08 pm

## 2012-03-26 ENCOUNTER — Ambulatory Visit: Payer: BC Managed Care – PPO | Admitting: Family

## 2012-03-26 NOTE — MAU Note (Signed)
Patient states she feels better was able to keep saltine crackers and sprite down. Headache now a 4/10 after percocet 2 tabs PO. No vomiting or diarrhea since arrival. Pelvic ultrasound normal. Patient discharged home per Dr. Seymour Bars orders.

## 2012-03-28 ENCOUNTER — Inpatient Hospital Stay (HOSPITAL_COMMUNITY)
Admission: AD | Admit: 2012-03-28 | Discharge: 2012-03-29 | Disposition: A | Discharge status: Home / Self Care | Payer: BC Managed Care – PPO | Source: Ambulatory Visit | Attending: Obstetrics & Gynecology | Admitting: Obstetrics & Gynecology

## 2012-03-28 DIAGNOSIS — R238 Other skin changes: Secondary | ICD-10-CM

## 2012-03-28 DIAGNOSIS — R51 Headache: Secondary | ICD-10-CM | POA: Insufficient documentation

## 2012-03-28 DIAGNOSIS — R109 Unspecified abdominal pain: Secondary | ICD-10-CM | POA: Insufficient documentation

## 2012-03-28 DIAGNOSIS — R0602 Shortness of breath: Secondary | ICD-10-CM | POA: Insufficient documentation

## 2012-03-29 ENCOUNTER — Encounter (HOSPITAL_COMMUNITY): Payer: Self-pay

## 2012-03-29 MED ORDER — KETOROLAC TROMETHAMINE 30 MG/ML IJ SOLN
30.0000 mg | Freq: Once | INTRAMUSCULAR | Status: AC
  Administered 2012-03-29: 30 mg via INTRAMUSCULAR
  Filled 2012-03-29: qty 1

## 2012-03-29 MED ORDER — TRAMADOL HCL 50 MG PO TABS: 50.0000 mg | ORAL_TABLET | Freq: Three times a day (TID) | ORAL | Status: AC | PRN

## 2012-03-29 MED ORDER — KETOROLAC TROMETHAMINE 30 MG/ML IJ SOLN
30.0000 mg | Freq: Once | INTRAMUSCULAR | Status: DC
Start: 2012-03-29 — End: 2012-03-29
  Filled 2012-03-29: qty 1

## 2012-03-29 NOTE — MAU Note (Signed)
Pt states woke up Friday morning with headache, body aches, abdominal pain and shortness of breath. States took advil at 1400 but has had no relief.

## 2012-03-29 NOTE — MAU Provider Note (Signed)
History     CSN: [id number]  Arrival date and time: 03/28/12 2359   None     No chief complaint on file.  HPI Several complaints. Told RN she came for fever, then changed to bodyache, then changes to SOB, then changed to "my skin is hurting from head to bottom", abdominal pain and back pain and cough.  No vag blding or abnormal discharge (seen in ED on 4/23 with n/v for several days and had normal post- op pelvic sono without abscess/ collection). No chills. Has hot flashes but no sweats (ovaries not removed). Denies n/v this time, and has been eating (although not much appetite). Took Ibuprofen at 2 pm and Percocet at 5 pm.  Has MS, Asthma, HTN.   Past Medical History  Diagnosis Date  . Asthma   . Diabetes mellitus   . Hypertension   . PCOS (polycystic ovarian syndrome)   . Gastroparesis   . Polyneuropathy   . Multiple sclerosis     Dr. Anne Hahn    Past Surgical History  Procedure Date  . Cholecystectomy   . Dilation and curettage of uterus   . Laparoscopic gastric banding 01/26/08  . Cesarean section   . Iud removal 03/19/2012    Procedure: INTRAUTERINE DEVICE (IUD) REMOVAL;  Surgeon: Genia Del, MD;  Location: [facility];  Service: Gynecology;  Laterality: N/A;  . Colon surgery   . Abdominal hysterectomy     Family History  Problem Relation Age of Onset  . Diabetes Mother   . Depression Mother   . Hypertension Mother   . Liver disease Father   . Depression Daughter     bipolar depression  . Coronary artery disease Other   . Cancer Other     ovarian  . Anesthesia problems Neg Hx     History  Substance Use Topics  . Smoking status: Never Smoker   . Smokeless tobacco: Not on file  . Alcohol Use: No    Allergies: No Known Allergies  Prescriptions prior to admission  Medication Sig Dispense Refill  . albuterol (PROVENTIL) (2.5 MG/3ML) 0.083% nebulizer solution Take 2.5 mg by nebulization as needed. Uses about twice/year for asthma.  Only has nebulizer  not MDI.      . cholecalciferol (VITAMIN D) 1000 UNITS tablet Take 1,000 Units by mouth daily.      [name] GILENYA 0.5 MG CAPS Take 1 tablet by mouth daily.      [name] ibuprofen (ADVIL,MOTRIN) 200 MG tablet Take 200 mg by mouth every 6 (six) hours as needed. For pain.      [name] losartan (COZAAR) 50 MG tablet Take 1 tablet (50 mg total) by mouth daily.  90 tablet  1  . metFORMIN (GLUCOPHAGE XR) 500 MG 24 hr tablet Take 1 tablet (500 mg total) by mouth 2 (two) times daily.  60 tablet  1  . oxyCODONE-acetaminophen (PERCOCET) 7.5-325 MG per tablet Take 1 tablet by mouth every 4 (four) hours as needed for pain.  30 tablet  0  . pregabalin (LYRICA) 100 MG capsule Take 100 mg by mouth 3 (three) times daily.        . promethazine (PHENERGAN) 25 MG tablet Take 1 tablet (25 mg total) by mouth every 6 (six) hours as needed for nausea.  30 tablet  0  . sitaGLIPtin (JANUVIA) 100 MG tablet Take 1 tablet (100 mg total) by mouth daily.  30 tablet  1    ROS Physical Exam   Blood pressure 132/79, pulse 85,  temperature 99.4 F (37.4 C), temperature source Oral, resp. rate 20, height 5\' 3"  (1.6 m), weight 116.302 kg (256 lb 6.4 oz), last menstrual period 03/17/2012, SpO2 98.00%.  Physical Exam A&O x 3, anxious, tearful but talks without distress and able to complete sentences. No acute distress. HEENT neg, Cranial nerve exam grossly normal.  Lungs CTA bilat, no crackles/ wheezing and normal air entry in bases as well.  CV RRR, S1S2 normal, no tachycardia. Abdo soft. Incisions CDI. Normal active BS, abdomen non distended, non tender and non acute.  Extr no edema/ tenderness Pelvic deferred  MAU Course  Procedures Inj Toradol 30 mg IM   Assessment and Plan  Post op status, completely normal detailed exam.  Unclear reason for ER visit considering normal clinical exam with no evidence if fever/ tachycardia/ surgical complications/ lung or cardiac findings /e/o VTE. Well controlled BP, normal O2 sats on RA.  Advised  not to take percocet if causing skin sensitivity. See her Neurologist to assess MS changes.  Toradol in MAU Needs to call and f/up in office and with her Neurologist.  Rx Ultram.  D/c home.  Minola Guin R 03/29/2012, 1:37 AM

## 2012-04-21 ENCOUNTER — Ambulatory Visit (INDEPENDENT_AMBULATORY_CARE_PROVIDER_SITE_OTHER): Payer: BC Managed Care – PPO | Admitting: Internal Medicine

## 2012-04-21 ENCOUNTER — Encounter: Payer: Self-pay | Admitting: Internal Medicine

## 2012-04-21 VITALS — BP 118/90 | HR 90 | Temp 98.7°F | Ht 63.0 in | Wt 242.0 lb

## 2012-04-21 DIAGNOSIS — E119 Type 2 diabetes mellitus without complications: Secondary | ICD-10-CM

## 2012-04-21 DIAGNOSIS — G35 Multiple sclerosis: Secondary | ICD-10-CM

## 2012-04-21 DIAGNOSIS — I10 Essential (primary) hypertension: Secondary | ICD-10-CM

## 2012-04-21 NOTE — Assessment & Plan Note (Addendum)
Blood sugars are fairly well-controlled. She is unable to tolerate metformin due to diarrhea. Patient advised to take 250 mg twice a day with meals. Continue Januvia. She is motivated to continue her lifestyle/dietary changes.  A1c before next OV

## 2012-04-21 NOTE — Assessment & Plan Note (Signed)
Well controlled.  Continue current dose of losartan. Patient may be able to decrease her medication if she loses significant amount of weight. Patient understands she may have to continue her antihypertensive despite weight loss. BP: 118/90 mmHg  Lab Results  Component Value Date   CREATININE 0.60 03/17/2012

## 2012-04-21 NOTE — Progress Notes (Signed)
Subjective:    Patient ID: [name], female    DOB: 1976-02-21, 36 y.o.   MRN: [id number]  HPI  36 year old Hispanic female with history of peptic diabetes, hypertension and multiple sclerosis for followup. Since previous visit patient underwent robotic-assisted hysterectomy. Surgeon did not perform oophorectomy. Surgery went very well. She is back to her usual activities. She has been able to lose approximately 20 pounds since previous visit via dietary changes. She is interested in trying to get off her diabetes medications and antihypertensives.  Her blood sugars have been fairly normal. She is having difficulty tolerating metformin but discontinued due to symptoms of diarrhea. She is taking Januvia a regular basis.  Review of Systems Negative for chest pain or shortness of breath  Past Medical History  Diagnosis Date  . Asthma   . Diabetes mellitus   . Hypertension   . PCOS (polycystic ovarian syndrome)   . Gastroparesis   . Polyneuropathy   . Multiple sclerosis     Dr. Anne Hahn    History   Social History  . Marital Status: Married    Spouse Name: Barbara Cower    Number of Children: N/A  . Years of Education: N/A   Occupational History  . 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 Smoking status: Never Smoker   . Smokeless tobacco: Not on file  . Alcohol Use: No  . Drug Use: No  . Sexually Active: Not Currently    Birth Control/ Protection: Surgical   Other Topics Concern  . Not on file   Social History Narrative  . No narrative on file    Past Surgical History  Procedure Date  . Cholecystectomy   . Dilation and curettage of uterus   . Laparoscopic gastric banding 01/26/08  . Cesarean section   . Iud removal 03/19/2012    Procedure: INTRAUTERINE DEVICE (IUD) REMOVAL;  Surgeon: Genia Del, MD;  Location: [facility];  Service: Gynecology;  Laterality: N/A;  . Colon surgery   . Abdominal hysterectomy     Family History  Problem Relation Age of Onset  .  Diabetes Mother   . Depression Mother   . Hypertension Mother   . Liver disease Father   . Depression Daughter     bipolar depression  . Coronary artery disease Other   . Cancer Other     ovarian  . Anesthesia problems Neg Hx     No Known Allergies  Current Outpatient Prescriptions on File Prior to Visit  Medication Sig Dispense Refill  . albuterol (PROVENTIL) (2.5 MG/3ML) 0.083% nebulizer solution Take 2.5 mg by nebulization as needed. Uses about twice/year for asthma.  Only has nebulizer not MDI.      . cholecalciferol (VITAMIN D) 1000 UNITS tablet Take 1,000 Units by mouth daily.      [name] GILENYA 0.5 MG CAPS Take 1 tablet by mouth daily.      [name] ibuprofen (ADVIL,MOTRIN) 200 MG tablet Take 200 mg by mouth every 6 (six) hours as needed. For pain.      [name] losartan (COZAAR) 50 MG tablet Take 1 tablet (50 mg total) by mouth daily.  90 tablet  1  . sitaGLIPtin (JANUVIA) 100 MG tablet Take 1 tablet (100 mg total) by mouth daily.  30 tablet  1  . metFORMIN (GLUCOPHAGE XR) 500 MG 24 hr tablet Take 1 tablet (500 mg total) by mouth 2 (two) times daily.  60 tablet  1  . pregabalin (LYRICA) 100 MG capsule Take 100  mg by mouth 3 (three) times daily.        . promethazine (PHENERGAN) 25 MG tablet Take 1 tablet (25 mg total) by mouth every 6 (six) hours as needed for nausea.  30 tablet  0    BP 118/90  Pulse 90  Temp(Src) 98.7 F (37.1 C) (Oral)  Ht 5\' 3"  (1.6 m)  Wt 242 lb (109.77 kg)  BMI 42.87 kg/m2  SpO2 97%  LMP 03/17/2012       Objective:   Physical Exam  Constitutional: She is oriented to person, place, and time. She appears well-developed and well-nourished.  Cardiovascular: Normal rate, regular rhythm and normal heart sounds.   No murmur heard. Pulmonary/Chest: Effort normal and breath sounds normal. She has no wheezes. She has no rales.  Neurological: She is alert and oriented to person, place, and time. No cranial nerve deficit.  Psychiatric: She has a normal mood and affect.  Her behavior is normal.       Assessment & Plan:

## 2012-04-21 NOTE — Patient Instructions (Signed)
Please complete the following lab tests before your next follow up appointment: BMET - 401.0 A1c, microalb/Cr ratio - 250.00 Lipid panel, LFTs - 250.00

## 2012-05-26 ENCOUNTER — Ambulatory Visit: Payer: BC Managed Care – PPO | Admitting: *Deleted

## 2012-07-16 ENCOUNTER — Other Ambulatory Visit: Payer: BC Managed Care – PPO

## 2012-07-23 ENCOUNTER — Ambulatory Visit: Payer: BC Managed Care – PPO | Admitting: Internal Medicine

## 2012-07-23 DIAGNOSIS — Z0289 Encounter for other administrative examinations: Secondary | ICD-10-CM

## 2012-08-22 ENCOUNTER — Telehealth (INDEPENDENT_AMBULATORY_CARE_PROVIDER_SITE_OTHER): Payer: Self-pay | Admitting: Surgery

## 2012-08-22 NOTE — Telephone Encounter (Signed)
I left a voicemail for patient advising the patient to please call [facility] @ [phone] to schedule a follow-up appointment...cef °

## 2012-10-10 ENCOUNTER — Encounter (HOSPITAL_COMMUNITY): Payer: Self-pay | Admitting: Emergency Medicine

## 2012-10-10 ENCOUNTER — Emergency Department (HOSPITAL_COMMUNITY): Payer: BC Managed Care – PPO

## 2012-10-10 ENCOUNTER — Emergency Department (HOSPITAL_COMMUNITY)
Admission: EM | Admit: 2012-10-10 | Discharge: 2012-10-10 | Disposition: A | Discharge status: Home / Self Care | Payer: BC Managed Care – PPO | Attending: Emergency Medicine | Admitting: Emergency Medicine

## 2012-10-10 DIAGNOSIS — S82839A Other fracture of upper and lower end of unspecified fibula, initial encounter for closed fracture: Secondary | ICD-10-CM

## 2012-10-10 DIAGNOSIS — I10 Essential (primary) hypertension: Secondary | ICD-10-CM | POA: Insufficient documentation

## 2012-10-10 DIAGNOSIS — IMO0002 Reserved for concepts with insufficient information to code with codable children: Secondary | ICD-10-CM | POA: Insufficient documentation

## 2012-10-10 DIAGNOSIS — Z8719 Personal history of other diseases of the digestive system: Secondary | ICD-10-CM | POA: Insufficient documentation

## 2012-10-10 DIAGNOSIS — G609 Hereditary and idiopathic neuropathy, unspecified: Secondary | ICD-10-CM | POA: Insufficient documentation

## 2012-10-10 DIAGNOSIS — R296 Repeated falls: Secondary | ICD-10-CM | POA: Insufficient documentation

## 2012-10-10 DIAGNOSIS — Y929 Unspecified place or not applicable: Secondary | ICD-10-CM | POA: Insufficient documentation

## 2012-10-10 DIAGNOSIS — E119 Type 2 diabetes mellitus without complications: Secondary | ICD-10-CM | POA: Insufficient documentation

## 2012-10-10 DIAGNOSIS — Y939 Activity, unspecified: Secondary | ICD-10-CM | POA: Insufficient documentation

## 2012-10-10 DIAGNOSIS — Z8669 Personal history of other diseases of the nervous system and sense organs: Secondary | ICD-10-CM | POA: Insufficient documentation

## 2012-10-10 DIAGNOSIS — G35 Multiple sclerosis: Secondary | ICD-10-CM | POA: Insufficient documentation

## 2012-10-10 DIAGNOSIS — W19XXXA Unspecified fall, initial encounter: Secondary | ICD-10-CM

## 2012-10-10 DIAGNOSIS — J45909 Unspecified asthma, uncomplicated: Secondary | ICD-10-CM | POA: Insufficient documentation

## 2012-10-10 DIAGNOSIS — S82899A Other fracture of unspecified lower leg, initial encounter for closed fracture: Secondary | ICD-10-CM | POA: Insufficient documentation

## 2012-10-10 MED ORDER — TRAMADOL HCL 50 MG PO TABS: 50.0000 mg | ORAL_TABLET | Freq: Four times a day (QID) | ORAL | Status: DC | PRN

## 2012-10-10 NOTE — ED Notes (Signed)
Pt arrives to ED c/o left ankle pain due to mechanical fall last night at 2300.  Pt hx of MS.  Pt cannot bear wt.  Pmsx4.  Abrasion on right knee-no bleed.  Pt denies loc.

## 2012-10-10 NOTE — ED Provider Notes (Signed)
History     CSN: [id number]  Arrival date & time 10/10/12  2952   First MD Initiated Contact with Patient 10/10/12 [phone]      Chief Complaint  Patient presents with  . Ankle Pain    (Consider location/radiation/quality/duration/timing/severity/associated sxs/prior treatment) Patient is a 36 y.o. female presenting with ankle pain. The history is provided by the patient.  Ankle Pain  The incident occurred yesterday. Pertinent negatives include no numbness.   patient states that her hips gave out and she fell last night around 11:00. She has a headache but does not remember hitting her head. No loss of consciousness. She has an abrasion to her right leg she states just a superficial. She has pain in her left ankle it has been having difficulty walking. She states she cannot bear weight. She has some tingling in her toes, she states this began after the fall. She has a history of multiple sclerosis. No chest or abdominal pain.   Past Medical History  Diagnosis Date  . Asthma   . Diabetes mellitus   . Hypertension   . PCOS (polycystic ovarian syndrome)   . Gastroparesis   . Polyneuropathy   . Multiple sclerosis     Dr. Anne Hahn    Past Surgical History  Procedure Date  . Cholecystectomy   . Dilation and curettage of uterus   . Laparoscopic gastric banding 01/26/08  . Cesarean section   . Iud removal 03/19/2012    Procedure: INTRAUTERINE DEVICE (IUD) REMOVAL;  Surgeon: Genia Del, MD;  Location: [facility];  Service: Gynecology;  Laterality: N/A;  . Colon surgery   . Abdominal hysterectomy     Family History  Problem Relation Age of Onset  . Diabetes Mother   . Depression Mother   . Hypertension Mother   . Liver disease Father   . Depression Daughter     bipolar depression  . Coronary artery disease Other   . Cancer Other     ovarian  . Anesthesia problems Neg Hx     History  Substance Use Topics  . Smoking status: Never Smoker   . Smokeless tobacco: Not on file    . Alcohol Use: No    OB History    Grav Para Term Preterm Abortions TAB SAB Ect Mult Living   1 1 1     0 0 0 1      Review of Systems  Constitutional: Negative for activity change and appetite change.  HENT: Negative for neck stiffness.   Eyes: Negative for pain.  Respiratory: Negative for chest tightness and shortness of breath.   Cardiovascular: Negative for chest pain and leg swelling.  Gastrointestinal: Negative for nausea, vomiting, abdominal pain and diarrhea.  Genitourinary: Negative for flank pain.  Musculoskeletal: Positive for joint swelling. Negative for back pain.  Skin: Positive for wound. Negative for rash.  Neurological: Positive for headaches. Negative for weakness and numbness.  Psychiatric/Behavioral: Negative for behavioral problems.    Allergies  Review of patient's allergies indicates no known allergies.  Home Medications   Current Outpatient Rx  Name  Route  Sig  Dispense  Refill  . IBUPROFEN 200 MG PO TABS   Oral   Take 600 mg by mouth every 6 (six) hours as needed. For pain.         [name] PROMETHAZINE HCL 25 MG PO TABS   Oral   Take 1 tablet (25 mg total) by mouth every 6 (six) hours as needed for nausea.   30  tablet   0   . TRAMADOL HCL 50 MG PO TABS   Oral   Take 1 tablet (50 mg total) by mouth every 6 (six) hours as needed for pain.   15 tablet   0     BP 134/96  Pulse 84  Temp 98.1 F (36.7 C) (Oral)  Resp 18  SpO2 96%  LMP 03/17/2012  Physical Exam  Nursing note and vitals reviewed. Constitutional: She is oriented to person, place, and time. She appears well-developed and well-nourished.  HENT:  Head: Normocephalic and atraumatic.       Tenderness to occiput.  Eyes: EOM are normal. Pupils are equal, round, and reactive to light.  Neck: Normal range of motion. Neck supple.  Cardiovascular: Normal rate, regular rhythm and normal heart sounds.   No murmur heard. Pulmonary/Chest: Effort normal and breath sounds normal. No  respiratory distress. She has no wheezes. She has no rales.  Abdominal: Soft. Bowel sounds are normal. She exhibits no distension. There is no tenderness. There is no rebound and no guarding.  Musculoskeletal:       Abrasion to right knee. Range of motion intact. No bony tenderness. Tenderness to left ankle laterally. Swelling anterior to lateral malleolus. Tenderness over distal left fibula. No tenderness over proximal fibula. Good cap refill. Paresthesias to left great toe.  Neurological: She is alert and oriented to person, place, and time. No cranial nerve deficit.  Skin: Skin is warm and dry.  Psychiatric: She has a normal mood and affect. Her speech is normal.    ED Course  Procedures (including critical care time)  Labs Reviewed - No data to display Dg Ankle Complete Left  10/10/2012  *RADIOLOGY REPORT*  Clinical Data: The patient fell and twisted the left ankle.  Medial pain but lateral swelling.  LEFT ANKLE COMPLETE - 3+ VIEW  Comparison: None.  Findings: Subtle the avulsion fracture from the tip of the lateral malleolus.  No other evidence of a fracture.  The ankle mortise is normally spaced and aligned.  There is soft tissue swelling predominately laterally.  IMPRESSION:  Small avulsion fracture from the tip of the lateral malleolus.  No other fractures.  No dislocation.   Original Report Authenticated By: Amie Portland, M.D.    Ct Head Wo Contrast  10/10/2012  *RADIOLOGY REPORT*  Clinical Data: Headache, fall  CT HEAD WITHOUT CONTRAST  Technique:  Contiguous axial images were obtained from the base of the skull through the vertex without contrast.  Comparison: Head CT 06/15/2011  Findings: No acute intracranial hemorrhage.  No focal mass lesion. No CT evidence of acute infarction.   No midline shift or mass effect.  No hydrocephalus.  Basilar cisterns are patent. Paranasal sinuses and mastoid air cells are clear.  Orbits are normal.  IMPRESSION: Normal head CT   Original Report  Authenticated By: Genevive Bi, M.D.      1. Fall   2. Avulsion fracture of distal fibula       MDM  Patient with fall. States she was unsteady. Her head but negative head CT. She has a small avulsion fracture distal fibula. She was given a Manufacturing systems engineer and will followup with Ortho as needed.        Juliet Rude. Rubin Payor, MD 10/10/12 [phone]

## 2012-10-10 NOTE — ED Notes (Signed)
Pt returned from RAD 

## 2012-10-10 NOTE — Progress Notes (Signed)
Orthopedic Tech Progress Note Patient Details:  [name] 19-Jun-1976 [id number]  Ortho Devices Type of Ortho Device: Crutches;CAM walker Ortho Device/Splint Location: LEFT CAM WALKER AND CRUTCHES 5. 4" Ortho Device/Splint Interventions: Application   Cammer, Mickie Bail 10/10/2012, 11:02 AM

## 2013-03-24 ENCOUNTER — Encounter (HOSPITAL_COMMUNITY): Payer: Self-pay | Admitting: *Deleted

## 2013-03-24 ENCOUNTER — Emergency Department (HOSPITAL_COMMUNITY)
Admission: EM | Admit: 2013-03-24 | Discharge: 2013-03-25 | Disposition: A | Discharge status: Home / Self Care | Payer: Self-pay | Attending: Emergency Medicine | Admitting: Emergency Medicine

## 2013-03-24 DIAGNOSIS — M542 Cervicalgia: Secondary | ICD-10-CM | POA: Insufficient documentation

## 2013-03-24 DIAGNOSIS — Z8639 Personal history of other endocrine, nutritional and metabolic disease: Secondary | ICD-10-CM | POA: Insufficient documentation

## 2013-03-24 DIAGNOSIS — I10 Essential (primary) hypertension: Secondary | ICD-10-CM | POA: Insufficient documentation

## 2013-03-24 DIAGNOSIS — G35 Multiple sclerosis: Secondary | ICD-10-CM | POA: Insufficient documentation

## 2013-03-24 DIAGNOSIS — Z8719 Personal history of other diseases of the digestive system: Secondary | ICD-10-CM | POA: Insufficient documentation

## 2013-03-24 DIAGNOSIS — R209 Unspecified disturbances of skin sensation: Secondary | ICD-10-CM | POA: Insufficient documentation

## 2013-03-24 DIAGNOSIS — Z8669 Personal history of other diseases of the nervous system and sense organs: Secondary | ICD-10-CM | POA: Insufficient documentation

## 2013-03-24 DIAGNOSIS — J45909 Unspecified asthma, uncomplicated: Secondary | ICD-10-CM | POA: Insufficient documentation

## 2013-03-24 DIAGNOSIS — Z862 Personal history of diseases of the blood and blood-forming organs and certain disorders involving the immune mechanism: Secondary | ICD-10-CM | POA: Insufficient documentation

## 2013-03-24 DIAGNOSIS — M549 Dorsalgia, unspecified: Secondary | ICD-10-CM | POA: Insufficient documentation

## 2013-03-24 DIAGNOSIS — M6281 Muscle weakness (generalized): Secondary | ICD-10-CM | POA: Insufficient documentation

## 2013-03-24 DIAGNOSIS — E119 Type 2 diabetes mellitus without complications: Secondary | ICD-10-CM | POA: Insufficient documentation

## 2013-03-24 LAB — COMPREHENSIVE METABOLIC PANEL
ALT: 17 U/L (ref 0–35)
AST: 18 U/L (ref 0–37)
Albumin: 3.9 g/dL (ref 3.5–5.2)
Alkaline Phosphatase: 67 U/L (ref 39–117)
BUN: 9 mg/dL (ref 6–23)
CO2: 27 mEq/L (ref 19–32)
Calcium: 9.4 mg/dL (ref 8.4–10.5)
Chloride: 104 mEq/L (ref 96–112)
Creatinine, Ser: 0.56 mg/dL (ref 0.50–1.10)
GFR calc Af Amer: >90 mL/min (ref >90)
GFR calc non Af Amer: >90 mL/min (ref >90)
Glucose, Bld: 108 mg/dL — ABNORMAL HIGH (ref 70–99)
Potassium: 3.8 mEq/L (ref 3.5–5.1)
Sodium: 139 mEq/L (ref 135–145)
Total Bilirubin: 0.3 mg/dL (ref 0.3–1.2)
Total Protein: 8 g/dL (ref 6.0–8.3)

## 2013-03-24 LAB — CBC WITH DIFFERENTIAL/PLATELET
Basophils Absolute: 0 10*3/uL (ref 0.0–0.1)
Basophils Relative: 0 % (ref 0–1)
Eosinophils Absolute: 0.3 10*3/uL (ref 0.0–0.7)
Eosinophils Relative: 3 % (ref 0–5)
HCT: 39.3 % (ref 36.0–46.0)
Hemoglobin: 14.1 g/dL (ref 12.0–15.0)
Lymphocytes Relative: 44 % (ref 12–46)
Lymphs Abs: 4.3 10*3/uL — ABNORMAL HIGH (ref 0.7–4.0)
MCH: 30.7 pg (ref 26.0–34.0)
MCHC: 35.9 g/dL (ref 30.0–36.0)
MCV: 85.6 fL (ref 78.0–100.0)
Monocytes Absolute: 0.6 10*3/uL (ref 0.1–1.0)
Monocytes Relative: 7 % (ref 3–12)
Neutro Abs: 4.7 10*3/uL (ref 1.7–7.7)
Neutrophils Relative %: 47 % (ref 43–77)
Platelets: 345 10*3/uL (ref 150–400)
RBC: 4.59 MIL/uL (ref 3.87–5.11)
RDW: 12.6 % (ref 11.5–15.5)
WBC: 9.9 10*3/uL (ref 4.0–10.5)

## 2013-03-24 MED ORDER — ONDANSETRON HCL 4 MG/2ML IJ SOLN
4.0000 mg | Freq: Once | INTRAMUSCULAR | Status: AC
  Administered 2013-03-24: 4 mg via INTRAVENOUS
  Filled 2013-03-24: qty 2

## 2013-03-24 MED ORDER — SODIUM CHLORIDE 0.9 % IV BOLUS (SEPSIS)
1000.0000 mL | Freq: Once | INTRAVENOUS | Status: AC
Start: 2013-03-24 — End: 2013-03-25
  Administered 2013-03-24: 1000 mL via INTRAVENOUS

## 2013-03-24 MED ORDER — MORPHINE SULFATE 4 MG/ML IJ SOLN
6.0000 mg | Freq: Once | INTRAMUSCULAR | Status: AC
  Administered 2013-03-24: 6 mg via INTRAVENOUS
  Filled 2013-03-24: qty 2

## 2013-03-24 MED ORDER — DEXAMETHASONE SODIUM PHOSPHATE 10 MG/ML IJ SOLN
10.0000 mg | Freq: Once | INTRAMUSCULAR | Status: AC
  Administered 2013-03-24: 10 mg via INTRAVENOUS
  Filled 2013-03-24: qty 1

## 2013-03-24 NOTE — ED Notes (Signed)
Pt states spinal, neck, leg pain from neck down, and spastic at nite has increased over last three weeks.  Pt has MS.

## 2013-03-25 LAB — URINALYSIS, ROUTINE W REFLEX MICROSCOPIC
Bilirubin Urine: NEGATIVE
Glucose, UA: NEGATIVE mg/dL
Hgb urine dipstick: NEGATIVE
Ketones, ur: NEGATIVE mg/dL
Leukocytes, UA: NEGATIVE
Nitrite: NEGATIVE
Protein, ur: NEGATIVE mg/dL
Specific Gravity, Urine: 1.035 — ABNORMAL HIGH (ref 1.005–1.030)
Urobilinogen, UA: 0.2 mg/dL (ref 0.0–1.0)
pH: 5.5 (ref 5.0–8.0)

## 2013-03-25 MED ORDER — PREDNISONE (PAK) 10 MG PO TABS: ORAL_TABLET | ORAL | Status: DC

## 2013-03-25 MED ORDER — OXYCODONE-ACETAMINOPHEN 5-325 MG PO TABS: 1.0000 | ORAL_TABLET | Freq: Four times a day (QID) | ORAL | Status: DC | PRN

## 2013-03-25 NOTE — ED Notes (Signed)
Pt has ride home.

## 2013-03-25 NOTE — ED Provider Notes (Signed)
Medical screening examination/treatment/procedure(s) were performed by non-physician practitioner and as supervising physician I was immediately available for consultation/collaboration.  Kaori Jumper, MD 03/25/13 0605 

## 2013-03-25 NOTE — ED Provider Notes (Signed)
History     CSN: [id number]  Arrival date & time 03/24/13  4098   First MD Initiated Contact with Patient 03/24/13 2232      Chief Complaint  Patient presents with  . MS exacerbation     (Consider location/radiation/quality/duration/timing/severity/associated sxs/prior treatment) HPI Patient presents emergency department with pain in her back and neck, along with progressively worsening, numbness, and weakness in her extremities.  Patient, states, that she has had this worsening over the last few months.  Pain, mainly started several days, ago.  Patient, states she's had this type of pain with her MS exacerbations.  Patient denies chest pain, shortness of breath, fever, rash, dizziness, syncope, nausea, or vomiting.  Patient, states she is unable to afford the medications and has not been taking them.  The patient, states, that she normally sees Dr. Anne Hahn, of neurology, but is not able to afford the visits.  Past Medical History  Diagnosis Date  . Asthma   . Diabetes mellitus   . Hypertension   . PCOS (polycystic ovarian syndrome)   . Gastroparesis   . Polyneuropathy   . Multiple sclerosis     Dr. Anne Hahn    Past Surgical History  Procedure Laterality Date  . Cholecystectomy    . Dilation and curettage of uterus    . Laparoscopic gastric banding  01/26/08  . Cesarean section    . Iud removal  03/19/2012    Procedure: INTRAUTERINE DEVICE (IUD) REMOVAL;  Surgeon: Genia Del, MD;  Location: [facility];  Service: Gynecology;  Laterality: N/A;  . Colon surgery    . Abdominal hysterectomy      Family History  Problem Relation Age of Onset  . Diabetes Mother   . Depression Mother   . Hypertension Mother   . Liver disease Father   . Depression Daughter     bipolar depression  . Coronary artery disease Other   . Cancer Other     ovarian  . Anesthesia problems Neg Hx     History  Substance Use Topics  . Smoking status: Never Smoker   . Smokeless tobacco: Not on file   . Alcohol Use: No    OB History   Grav Para Term Preterm Abortions TAB SAB Ect Mult Living   1 1 1     0 0 0 1      Review of Systems All other systems negative except as documented in the HPI. All pertinent positives and negatives as reviewed in the HPI. Allergies  Review of patient's allergies indicates no known allergies.  Home Medications   Current Outpatient Rx  Name  Route  Sig  Dispense  Refill  . ibuprofen (ADVIL,MOTRIN) 200 MG tablet   Oral   Take 200 mg by mouth every 6 (six) hours as needed. For pain.           BP 107/77  Pulse 70  Temp(Src) 98.3 F (36.8 C) (Oral)  Resp 18  SpO2 97%  LMP 03/17/2012  Physical Exam  Nursing note and vitals reviewed. Constitutional: She is oriented to person, place, and time. She appears well-developed and well-nourished. She appears distressed.  HENT:  Head: Normocephalic and atraumatic.  Eyes: Pupils are equal, round, and reactive to light.  Neck: Normal range of motion. Neck supple.  Cardiovascular: Normal rate, regular rhythm and normal heart sounds.  Exam reveals no gallop and no friction rub.   No murmur heard. Pulmonary/Chest: Effort normal and breath sounds normal. No respiratory distress.  Abdominal: Soft.  Bowel sounds are normal.  Neurological: She is alert and oriented to person, place, and time. She has normal strength. No sensory deficit. She exhibits normal muscle tone. Coordination normal. GCS eye subscore is 4. GCS verbal subscore is 5. GCS motor subscore is 6.  Skin: Skin is warm and dry. No rash noted.    ED Course  Procedures (including critical care time)  Labs Reviewed  CBC WITH DIFFERENTIAL - Abnormal; Notable for the following:    Lymphs Abs 4.3 (*)    All other components within normal limits  COMPREHENSIVE METABOLIC PANEL - Abnormal; Notable for the following:    Glucose, Bld 108 (*)    All other components within normal limits  URINALYSIS, ROUTINE W REFLEX MICROSCOPIC   I spoke with Dr.  Roseanne Reno of neurology but feels 60 mg prednisone over about a weeks period. Patient will need to followup with Dr. Anne Hahn for further evaluation and care. Patient has normal strength in all 4 extremities.  Patient is advised return here for any worsening in her condition.  Patient will be given pain control as well.   MDM  MDM Reviewed: vitals and nursing note Interpretation: labs Consults: neurology            Carlyle Dolly, PA-C 03/25/13 0104

## 2013-03-25 NOTE — ED Notes (Signed)
Pt ambulated to bathroom. Halfway to restroom pt stopped and requested a wheelchair. Pt wheelchaired to bathroom for urine sample. Pt instructed to pull cord for assistance.

## 2013-04-13 ENCOUNTER — Telehealth: Payer: Self-pay | Admitting: Neurology

## 2013-04-13 NOTE — Telephone Encounter (Signed)
done

## 2013-04-15 ENCOUNTER — Ambulatory Visit: Payer: BC Managed Care – PPO | Admitting: Internal Medicine

## 2013-05-01 ENCOUNTER — Ambulatory Visit: Payer: BC Managed Care – PPO | Admitting: Internal Medicine

## 2013-05-01 DIAGNOSIS — Z0289 Encounter for other administrative examinations: Secondary | ICD-10-CM

## 2013-05-11 ENCOUNTER — Encounter (HOSPITAL_COMMUNITY): Payer: Self-pay | Admitting: Emergency Medicine

## 2013-05-11 ENCOUNTER — Emergency Department (HOSPITAL_COMMUNITY)
Admission: EM | Admit: 2013-05-11 | Discharge: 2013-05-11 | Disposition: A | Discharge status: Home / Self Care | Payer: Self-pay | Attending: Emergency Medicine | Admitting: Emergency Medicine

## 2013-05-11 ENCOUNTER — Emergency Department (HOSPITAL_COMMUNITY): Payer: Self-pay

## 2013-05-11 DIAGNOSIS — Z862 Personal history of diseases of the blood and blood-forming organs and certain disorders involving the immune mechanism: Secondary | ICD-10-CM | POA: Insufficient documentation

## 2013-05-11 DIAGNOSIS — J45909 Unspecified asthma, uncomplicated: Secondary | ICD-10-CM | POA: Insufficient documentation

## 2013-05-11 DIAGNOSIS — M79609 Pain in unspecified limb: Secondary | ICD-10-CM | POA: Insufficient documentation

## 2013-05-11 DIAGNOSIS — Z8719 Personal history of other diseases of the digestive system: Secondary | ICD-10-CM | POA: Insufficient documentation

## 2013-05-11 DIAGNOSIS — R55 Syncope and collapse: Secondary | ICD-10-CM | POA: Insufficient documentation

## 2013-05-11 DIAGNOSIS — Z8669 Personal history of other diseases of the nervous system and sense organs: Secondary | ICD-10-CM | POA: Insufficient documentation

## 2013-05-11 DIAGNOSIS — G35 Multiple sclerosis: Secondary | ICD-10-CM | POA: Insufficient documentation

## 2013-05-11 DIAGNOSIS — E119 Type 2 diabetes mellitus without complications: Secondary | ICD-10-CM | POA: Insufficient documentation

## 2013-05-11 DIAGNOSIS — Z8639 Personal history of other endocrine, nutritional and metabolic disease: Secondary | ICD-10-CM | POA: Insufficient documentation

## 2013-05-11 LAB — URINALYSIS, ROUTINE W REFLEX MICROSCOPIC
Glucose, UA: NEGATIVE mg/dL
Hgb urine dipstick: NEGATIVE
Ketones, ur: NEGATIVE mg/dL
Leukocytes, UA: NEGATIVE
Nitrite: NEGATIVE
Protein, ur: NEGATIVE mg/dL
Specific Gravity, Urine: 1.034 — ABNORMAL HIGH (ref 1.005–1.030)
Urobilinogen, UA: 1 mg/dL (ref 0.0–1.0)
pH: 5 (ref 5.0–8.0)

## 2013-05-11 LAB — POCT I-STAT TROPONIN I: Troponin i, poc: 0 ng/mL (ref 0.00–0.08)

## 2013-05-11 LAB — COMPREHENSIVE METABOLIC PANEL
ALT: 25 U/L (ref 0–35)
AST: 18 U/L (ref 0–37)
Albumin: 3.8 g/dL (ref 3.5–5.2)
Alkaline Phosphatase: 66 U/L (ref 39–117)
BUN: 9 mg/dL (ref 6–23)
CO2: 25 mEq/L (ref 19–32)
Calcium: 9.5 mg/dL (ref 8.4–10.5)
Chloride: 103 mEq/L (ref 96–112)
Creatinine, Ser: 0.59 mg/dL (ref 0.50–1.10)
GFR calc Af Amer: >90 mL/min (ref >90)
GFR calc non Af Amer: >90 mL/min (ref >90)
Glucose, Bld: 95 mg/dL (ref 70–99)
Potassium: 3.6 mEq/L (ref 3.5–5.1)
Sodium: 137 mEq/L (ref 135–145)
Total Bilirubin: 0.3 mg/dL (ref 0.3–1.2)
Total Protein: 8 g/dL (ref 6.0–8.3)

## 2013-05-11 LAB — CBC WITH DIFFERENTIAL/PLATELET
Basophils Absolute: 0 10*3/uL (ref 0.0–0.1)
Basophils Relative: 0 % (ref 0–1)
Eosinophils Absolute: 0.2 10*3/uL (ref 0.0–0.7)
Eosinophils Relative: 2 % (ref 0–5)
HCT: 40.2 % (ref 36.0–46.0)
Hemoglobin: 13.9 g/dL (ref 12.0–15.0)
Lymphocytes Relative: 41 % (ref 12–46)
Lymphs Abs: 3.8 10*3/uL (ref 0.7–4.0)
MCH: 30.8 pg (ref 26.0–34.0)
MCHC: 34.6 g/dL (ref 30.0–36.0)
MCV: 88.9 fL (ref 78.0–100.0)
Monocytes Absolute: 0.8 10*3/uL (ref 0.1–1.0)
Monocytes Relative: 8 % (ref 3–12)
Neutro Abs: 4.6 10*3/uL (ref 1.7–7.7)
Neutrophils Relative %: 49 % (ref 43–77)
Platelets: 378 10*3/uL (ref 150–400)
RBC: 4.52 MIL/uL (ref 3.87–5.11)
RDW: 12.6 % (ref 11.5–15.5)
WBC: 9.3 10*3/uL (ref 4.0–10.5)

## 2013-05-11 MED ORDER — PREDNISONE 20 MG PO TABS: 40.0000 mg | ORAL_TABLET | Freq: Every day | ORAL | Status: DC

## 2013-05-11 MED ORDER — MORPHINE SULFATE 4 MG/ML IJ SOLN
6.0000 mg | Freq: Once | INTRAMUSCULAR | Status: AC
  Administered 2013-05-11: 6 mg via INTRAVENOUS
  Filled 2013-05-11: qty 2

## 2013-05-11 MED ORDER — SODIUM CHLORIDE 0.9 % IV BOLUS (SEPSIS)
1000.0000 mL | Freq: Once | INTRAVENOUS | Status: AC
  Administered 2013-05-11: 1000 mL via INTRAVENOUS

## 2013-05-11 MED ORDER — HYDROMORPHONE HCL PF 1 MG/ML IJ SOLN
1.0000 mg | Freq: Once | INTRAMUSCULAR | Status: AC
  Administered 2013-05-11: 1 mg via INTRAVENOUS
  Filled 2013-05-11: qty 1

## 2013-05-11 MED ORDER — LORAZEPAM 2 MG/ML IJ SOLN
1.5000 mg | Freq: Once | INTRAMUSCULAR | Status: AC
  Administered 2013-05-11: 1.5 mg via INTRAVENOUS
  Filled 2013-05-11: qty 1

## 2013-05-11 MED ORDER — ONDANSETRON HCL 4 MG/2ML IJ SOLN
4.0000 mg | Freq: Once | INTRAMUSCULAR | Status: AC
  Administered 2013-05-11: 4 mg via INTRAVENOUS
  Filled 2013-05-11: qty 2

## 2013-05-11 MED ORDER — METHYLPREDNISOLONE SODIUM SUCC 125 MG IJ SOLR
250.0000 mg | Freq: Once | INTRAMUSCULAR | Status: AC
  Administered 2013-05-11: 250 mg via INTRAVENOUS
  Filled 2013-05-11: qty 4

## 2013-05-11 NOTE — ED Notes (Signed)
Bed:WA02<BR> Expected date:<BR> Expected time:<BR> Means of arrival:<BR> Comments:<BR>

## 2013-05-11 NOTE — ED Notes (Signed)
Pt presenting to ed with c/o syncopal episode while at work today. Pt states she is having headache pt denies nausea and vomiting pt states positive lightheadedness and dizziness. Pt states she is out of her MS medications and thinks it due to her MS

## 2013-05-19 NOTE — ED Provider Notes (Signed)
History     CSN: [id number]  Arrival date & time 05/11/13  1531   First MD Initiated Contact with Patient 05/11/13 1705      Chief Complaint  Patient presents with  . Near Syncope    (Consider location/radiation/quality/duration/timing/severity/associated sxs/prior treatment) Patient is a 37 y.o. female presenting with syncope.  Loss of Consciousness Episode history:  Single Most recent episode:  Today Progression:  Resolved Chronicity:  New Witnessed: no   Worsened by:  Nothing tried Associated symptoms: no chest pain, no diaphoresis, no difficulty breathing, no fever, no seizures, no shortness of breath and no vomiting    36yF with syncope just prior to arrival while at work. Says was having increasing pain in legs which she attributes to her MS. Was walking to her supervisor's office and then remembers waking up on floor? Not sure how she got there. Little in terms of preceding symptoms aside form b/l leg pain.   Past Medical History  Diagnosis Date  . Asthma   . Diabetes mellitus   . Hypertension   . PCOS (polycystic ovarian syndrome)   . Gastroparesis   . Polyneuropathy   . Multiple sclerosis     Dr. Anne Hahn    Past Surgical History  Procedure Laterality Date  . Cholecystectomy    . Dilation and curettage of uterus    . Laparoscopic gastric banding  01/26/08  . Cesarean section    . Iud removal  03/19/2012    Procedure: INTRAUTERINE DEVICE (IUD) REMOVAL;  Surgeon: Genia Del, MD;  Location: [facility];  Service: Gynecology;  Laterality: N/A;  . Colon surgery    . Abdominal hysterectomy      Family History  Problem Relation Age of Onset  . Diabetes Mother   . Depression Mother   . Hypertension Mother   . Liver disease Father   . Depression Daughter     bipolar depression  . Coronary artery disease Other   . Cancer Other     ovarian  . Anesthesia problems Neg Hx     History  Substance Use Topics  . Smoking status: Never Smoker   . Smokeless  tobacco: Not on file  . Alcohol Use: No    OB History   Grav Para Term Preterm Abortions TAB SAB Ect Mult Living   1 1 1     0 0 0 1      Review of Systems  Constitutional: Negative for fever and diaphoresis.  Respiratory: Negative for shortness of breath.   Cardiovascular: Positive for syncope. Negative for chest pain.  Gastrointestinal: Negative for vomiting.  Neurological: Negative for seizures.    All systems reviewed and negative, other than as noted in HPI.   Allergies  Review of patient's allergies indicates no known allergies.  Home Medications   Current Outpatient Rx  Name  Route  Sig  Dispense  Refill  . ibuprofen (ADVIL,MOTRIN) 200 MG tablet   Oral   Take 200 mg by mouth every 6 (six) hours as needed. For pain.         [name] oxyCODONE-acetaminophen (PERCOCET/ROXICET) 5-325 MG per tablet   Oral   Take 1 tablet by mouth every 6 (six) hours as needed for pain.   15 tablet   0   . predniSONE (DELTASONE) 20 MG tablet   Oral   Take 2 tablets (40 mg total) by mouth daily.   10 tablet   0     BP 110/71  Pulse 77  Temp(Src) 98.7 F (  37.1 C) (Oral)  Resp 18  SpO2 96%  LMP 03/17/2012  Physical Exam  Nursing note and vitals reviewed. Constitutional: She is oriented to person, place, and time. She appears well-developed and well-nourished. No distress.  Laying in bed. NAD. Obese.  HENT:  Head: Normocephalic and atraumatic.  Eyes: Conjunctivae are normal. Pupils are equal, round, and reactive to light. Right eye exhibits no discharge. Left eye exhibits no discharge.  Neck: Neck supple.  Cardiovascular: Normal rate, regular rhythm and normal heart sounds.  Exam reveals no gallop and no friction rub.   No murmur heard. Pulmonary/Chest: Effort normal and breath sounds normal. No respiratory distress.  Abdominal: Soft. She exhibits no distension. There is no tenderness.  Musculoskeletal: She exhibits no edema and no tenderness.  Neurological: She is alert and  oriented to person, place, and time. No cranial nerve deficit. She exhibits normal muscle tone. Coordination normal.  Skin: Skin is warm and dry.  Psychiatric: She has a normal mood and affect. Her behavior is normal. Thought content normal.    ED Course  Procedures (including critical care time)  Labs Reviewed  URINALYSIS, ROUTINE W REFLEX MICROSCOPIC - Abnormal; Notable for the following:    Color, Urine AMBER (*)    APPearance CLOUDY (*)    Specific Gravity, Urine 1.034 (*)    Bilirubin Urine SMALL (*)    All other components within normal limits  CBC WITH DIFFERENTIAL  COMPREHENSIVE METABOLIC PANEL  POCT I-STAT TROPONIN I   No results found.  EKG:  Rhythm: normal sinus rhythm Vent. rate 87 BPM PR interval 148 ms QRS duration 88 ms QT/QTc 380/457 ms ST segments: NS ST changes   1. Syncope   2. MS (multiple sclerosis)       MDM  36yF with syncope. W/u today fairly unremarkable. Has remained HD stable in ED. Pt thinks symptoms may be due to MS. May be so. Will give course steroids. Has neurology FU. Return precautions dicussed.         Raeford Razor, MD 05/19/13 [phone]

## 2013-06-19 ENCOUNTER — Encounter (HOSPITAL_COMMUNITY): Payer: Self-pay | Admitting: Emergency Medicine

## 2013-06-19 ENCOUNTER — Emergency Department (HOSPITAL_COMMUNITY): Payer: Self-pay

## 2013-06-19 ENCOUNTER — Emergency Department (HOSPITAL_COMMUNITY)
Admission: EM | Admit: 2013-06-19 | Discharge: 2013-06-19 | Disposition: A | Discharge status: Home / Self Care | Payer: Self-pay | Attending: Emergency Medicine | Admitting: Emergency Medicine

## 2013-06-19 DIAGNOSIS — Z8669 Personal history of other diseases of the nervous system and sense organs: Secondary | ICD-10-CM | POA: Insufficient documentation

## 2013-06-19 DIAGNOSIS — J45909 Unspecified asthma, uncomplicated: Secondary | ICD-10-CM | POA: Insufficient documentation

## 2013-06-19 DIAGNOSIS — M6281 Muscle weakness (generalized): Secondary | ICD-10-CM | POA: Insufficient documentation

## 2013-06-19 DIAGNOSIS — M79609 Pain in unspecified limb: Secondary | ICD-10-CM | POA: Insufficient documentation

## 2013-06-19 DIAGNOSIS — G35 Multiple sclerosis: Secondary | ICD-10-CM | POA: Insufficient documentation

## 2013-06-19 DIAGNOSIS — R32 Unspecified urinary incontinence: Secondary | ICD-10-CM | POA: Insufficient documentation

## 2013-06-19 DIAGNOSIS — Z8719 Personal history of other diseases of the digestive system: Secondary | ICD-10-CM | POA: Insufficient documentation

## 2013-06-19 DIAGNOSIS — Z3202 Encounter for pregnancy test, result negative: Secondary | ICD-10-CM | POA: Insufficient documentation

## 2013-06-19 DIAGNOSIS — Z8742 Personal history of other diseases of the female genital tract: Secondary | ICD-10-CM | POA: Insufficient documentation

## 2013-06-19 DIAGNOSIS — I10 Essential (primary) hypertension: Secondary | ICD-10-CM | POA: Insufficient documentation

## 2013-06-19 DIAGNOSIS — F3289 Other specified depressive episodes: Secondary | ICD-10-CM | POA: Insufficient documentation

## 2013-06-19 DIAGNOSIS — M545 Low back pain, unspecified: Secondary | ICD-10-CM | POA: Insufficient documentation

## 2013-06-19 DIAGNOSIS — F329 Major depressive disorder, single episode, unspecified: Secondary | ICD-10-CM | POA: Insufficient documentation

## 2013-06-19 DIAGNOSIS — E119 Type 2 diabetes mellitus without complications: Secondary | ICD-10-CM | POA: Insufficient documentation

## 2013-06-19 DIAGNOSIS — R52 Pain, unspecified: Secondary | ICD-10-CM | POA: Insufficient documentation

## 2013-06-19 LAB — COMPREHENSIVE METABOLIC PANEL
ALT: 20 U/L (ref 0–35)
AST: 18 U/L (ref 0–37)
Albumin: 3.6 g/dL (ref 3.5–5.2)
Alkaline Phosphatase: 66 U/L (ref 39–117)
BUN: 7 mg/dL (ref 6–23)
CO2: 24 mEq/L (ref 19–32)
Calcium: 8.8 mg/dL (ref 8.4–10.5)
Chloride: 103 mEq/L (ref 96–112)
Creatinine, Ser: 0.59 mg/dL (ref 0.50–1.10)
GFR calc Af Amer: >90 mL/min (ref >90)
GFR calc non Af Amer: >90 mL/min (ref >90)
Glucose, Bld: 107 mg/dL — ABNORMAL HIGH (ref 70–99)
Potassium: 3.9 mEq/L (ref 3.5–5.1)
Sodium: 137 mEq/L (ref 135–145)
Total Bilirubin: 0.3 mg/dL (ref 0.3–1.2)
Total Protein: 7.4 g/dL (ref 6.0–8.3)

## 2013-06-19 LAB — CBC WITH DIFFERENTIAL/PLATELET
Basophils Absolute: 0 10*3/uL (ref 0.0–0.1)
Basophils Relative: 0 % (ref 0–1)
Eosinophils Absolute: 0.3 10*3/uL (ref 0.0–0.7)
Eosinophils Relative: 3 % (ref 0–5)
HCT: 41.3 % (ref 36.0–46.0)
Hemoglobin: 14 g/dL (ref 12.0–15.0)
Lymphocytes Relative: 39 % (ref 12–46)
Lymphs Abs: 3.3 10*3/uL (ref 0.7–4.0)
MCH: 30.6 pg (ref 26.0–34.0)
MCHC: 33.9 g/dL (ref 30.0–36.0)
MCV: 90.2 fL (ref 78.0–100.0)
Monocytes Absolute: 0.6 10*3/uL (ref 0.1–1.0)
Monocytes Relative: 6 % (ref 3–12)
Neutro Abs: 4.5 10*3/uL (ref 1.7–7.7)
Neutrophils Relative %: 52 % (ref 43–77)
Platelets: 331 10*3/uL (ref 150–400)
RBC: 4.58 MIL/uL (ref 3.87–5.11)
RDW: 12.6 % (ref 11.5–15.5)
WBC: 8.7 10*3/uL (ref 4.0–10.5)

## 2013-06-19 LAB — URINALYSIS, ROUTINE W REFLEX MICROSCOPIC
Bilirubin Urine: NEGATIVE
Glucose, UA: NEGATIVE mg/dL
Ketones, ur: NEGATIVE mg/dL
Leukocytes, UA: NEGATIVE
Nitrite: NEGATIVE
Protein, ur: NEGATIVE mg/dL
Specific Gravity, Urine: 1.014 (ref 1.005–1.030)
Urobilinogen, UA: 0.2 mg/dL (ref 0.0–1.0)
pH: 5 (ref 5.0–8.0)

## 2013-06-19 LAB — URINE MICROSCOPIC-ADD ON

## 2013-06-19 LAB — PREGNANCY, URINE: Preg Test, Ur: NEGATIVE

## 2013-06-19 MED ORDER — MORPHINE SULFATE 4 MG/ML IJ SOLN
4.0000 mg | Freq: Once | INTRAMUSCULAR | Status: AC
  Administered 2013-06-19: 4 mg via INTRAVENOUS
  Filled 2013-06-19: qty 1

## 2013-06-19 MED ORDER — OXYCODONE-ACETAMINOPHEN 5-325 MG PO TABS: 1.0000 | ORAL_TABLET | ORAL | Status: DC | PRN

## 2013-06-19 MED ORDER — GADOBENATE DIMEGLUMINE 529 MG/ML IV SOLN
20.0000 mL | Freq: Once | INTRAVENOUS | Status: AC | PRN
  Administered 2013-06-19: 20 mL via INTRAVENOUS

## 2013-06-19 MED ORDER — LORAZEPAM 2 MG/ML IJ SOLN
0.5000 mg | Freq: Once | INTRAMUSCULAR | Status: AC
  Administered 2013-06-19: 0.5 mg via INTRAVENOUS
  Filled 2013-06-19: qty 1

## 2013-06-19 MED ORDER — HYDROMORPHONE HCL PF 1 MG/ML IJ SOLN
1.0000 mg | Freq: Once | INTRAMUSCULAR | Status: AC
  Administered 2013-06-19: 1 mg via INTRAVENOUS
  Filled 2013-06-19: qty 1

## 2013-06-19 MED ORDER — PREGABALIN 50 MG PO CAPS: 50.0000 mg | ORAL_CAPSULE | Freq: Two times a day (BID) | ORAL | Status: DC

## 2013-06-19 MED ORDER — LORAZEPAM 2 MG/ML IJ SOLN
1.0000 mg | Freq: Once | INTRAMUSCULAR | Status: AC
  Administered 2013-06-19: 1 mg via INTRAVENOUS
  Filled 2013-06-19: qty 1

## 2013-06-19 NOTE — ED Notes (Signed)
Patient returned from MRI.

## 2013-06-19 NOTE — ED Notes (Signed)
Took medication to patient in MRI due to patient unable to lie flat on her back.

## 2013-06-19 NOTE — ED Notes (Signed)
Pt reports having MS flare up past week. Pt reports falling and hitting right side on tub. Pt also has increase pain in legs and arms and middle back. Also reports difficulty holding her urine back and has been leaking urine. Pt tearful but answering question. NAD.

## 2013-06-19 NOTE — ED Notes (Addendum)
Patient was incontinent, linen changed, gown changed, patient repositioned and cleaned up.

## 2013-06-19 NOTE — ED Notes (Signed)
Patient transported to MRI 

## 2013-06-19 NOTE — ED Notes (Signed)
60 ccs of urine- patient had another episode of incontinence

## 2013-06-19 NOTE — ED Provider Notes (Signed)
7:53 PM Discussed case/MRI with Dr Thad Ranger, neurology. She reviewed films. Recommending lyrica, 50mg  BID initially and PCP or neurologist can titrate up as needed.   Raeford Razor, MD 06/19/13 [phone]

## 2013-06-19 NOTE — Progress Notes (Signed)
[facility] consulted to see patient regarding medication assistance.  Patient confirms that she does not have insurance.  Patient stated that she does have a pcp.  As per [facility] rep Misty Stanley, patient reports not seeing her pcp in a long time because they wanted her to pay up front.  [facility] provided patient with information about the MAP program for medication assistance.  Also gave patient list of community resources for financial assistance, list of discount pharmicies such as [employer], [employer], [employer], povided information on Adult Care Act and Medicaid for insurance and provided patient with medication discount card.  [facility] rep provided patient with orange card application.  Patient thankful for resources.

## 2013-06-19 NOTE — Progress Notes (Signed)
[facility] CL seen patient. Pt stated that she had a pcp but had not seen them in awhile "because they wanted their money up front." Patient stated that she tried to receive insurance through [facility] but never heard back about it. Provided her with a [facility]-Orange Card application. ED CM also provided patient with primary care resources and information about the MAP program. CL explained to patient how important it was to get a pcp, either through [employer] or self pay.

## 2013-06-19 NOTE — ED Provider Notes (Signed)
History    CSN: [id number] Arrival date & time 06/19/13  1154  First MD Initiated Contact with Patient 06/19/13 1222     No chief complaint on file.  (Consider location/radiation/quality/duration/timing/severity/associated sxs/prior Treatment) HPI Pt with history of MS has not f/u with her neurologist in > 1 year. Presents with burning pain to bl arms and legs, lower back x 1 week. Pt has been unable to hold bladder and has been urinating on herself. No fever or chills. No back, head or neck trauma. Diffuse weakness throughout.  Past Medical History  Diagnosis Date  . Asthma   . Diabetes mellitus   . Hypertension   . PCOS (polycystic ovarian syndrome)   . Gastroparesis   . Polyneuropathy   . Multiple sclerosis     Dr. Anne Hahn   Past Surgical History  Procedure Laterality Date  . Cholecystectomy    . Dilation and curettage of uterus    . Laparoscopic gastric banding  01/26/08  . Cesarean section    . Iud removal  03/19/2012    Procedure: INTRAUTERINE DEVICE (IUD) REMOVAL;  Surgeon: Genia Del, MD;  Location: [facility];  Service: Gynecology;  Laterality: N/A;  . Colon surgery    . Abdominal hysterectomy     Family History  Problem Relation Age of Onset  . Diabetes Mother   . Depression Mother   . Hypertension Mother   . Liver disease Father   . Depression Daughter     bipolar depression  . Coronary artery disease Other   . Cancer Other     ovarian  . Anesthesia problems Neg Hx    History  Substance Use Topics  . Smoking status: Never Smoker   . Smokeless tobacco: Not on file  . Alcohol Use: No   OB History   Grav Para Term Preterm Abortions TAB SAB Ect Mult Living   1 1 1     0 0 0 1     Review of Systems  Constitutional: Negative for fever and chills.  HENT: Negative for neck pain.   Eyes: Negative for visual disturbance.  Respiratory: Negative for shortness of breath.   Cardiovascular: Negative for chest pain.  Gastrointestinal: Negative for nausea,  vomiting, abdominal pain and diarrhea.  Genitourinary: Negative for dysuria and frequency.  Musculoskeletal: Positive for myalgias and back pain.  Skin: Negative for rash and wound.  Neurological: Positive for weakness. Negative for dizziness, syncope, light-headedness, numbness and headaches.  All other systems reviewed and are negative.    Allergies  Review of patient's allergies indicates no known allergies.  Home Medications   Current Outpatient Rx  Name  Route  Sig  Dispense  Refill  . acetaminophen (TYLENOL) 500 MG tablet   Oral   Take 1,000 mg by mouth every 6 (six) hours as needed for pain.          BP 124/101  Pulse 84  Temp(Src) 98.8 F (37.1 C) (Oral)  Resp 18  SpO2 99%  LMP 03/17/2012 Physical Exam  Nursing note and vitals reviewed. Constitutional: She is oriented to person, place, and time. She appears well-developed and well-nourished. No distress.  tearful  HENT:  Head: Normocephalic and atraumatic.  Mouth/Throat: Oropharynx is clear and moist.  Eyes: EOM are normal. Pupils are equal, round, and reactive to light.  Neck: Normal range of motion. Neck supple.  Cardiovascular: Normal rate and regular rhythm.   Pulmonary/Chest: Effort normal and breath sounds normal. No respiratory distress. She has no wheezes. She has  no rales.  Abdominal: Soft. Bowel sounds are normal. She exhibits no distension and no mass. There is no tenderness. There is no rebound and no guarding.  Musculoskeletal: Normal range of motion. She exhibits no edema and no tenderness.  No lumbar TTP. No obvious deformity.   Neurological: She is alert and oriented to person, place, and time.  4/5 motor in all ext. Sensation intact. No saddle anesthesia.   Skin: Skin is warm and dry. No rash noted. No erythema.  Psychiatric:  Depressed mood    ED Course  Procedures (including critical care time) Labs Reviewed  CBC WITH DIFFERENTIAL  COMPREHENSIVE METABOLIC PANEL  PREGNANCY, URINE   URINALYSIS, ROUTINE W REFLEX MICROSCOPIC   No results found. No diagnosis found.  MDM  Pt with 60 cc post void residual.  Discussed with Dr Cyril Mourning. Suggested MRI w/wo contrast of brain and entire spine. No steroids at this point.   Will sign out to oncoming EDP, Dr Juleen China, pending MRI  Loren Racer, MD 06/19/13 1525

## 2013-06-25 ENCOUNTER — Emergency Department (HOSPITAL_COMMUNITY)
Admission: EM | Admit: 2013-06-25 | Discharge: 2013-06-25 | Disposition: A | Discharge status: Home / Self Care | Payer: Self-pay | Attending: Emergency Medicine | Admitting: Emergency Medicine

## 2013-06-25 ENCOUNTER — Telehealth: Payer: Self-pay

## 2013-06-25 ENCOUNTER — Encounter (HOSPITAL_COMMUNITY): Payer: Self-pay | Admitting: Emergency Medicine

## 2013-06-25 ENCOUNTER — Emergency Department (INDEPENDENT_AMBULATORY_CARE_PROVIDER_SITE_OTHER)
Admission: EM | Admit: 2013-06-25 | Discharge: 2013-06-25 | Disposition: A | Discharge status: Nursing Home (Includes ICF) | Payer: Self-pay | Source: Home / Self Care

## 2013-06-25 DIAGNOSIS — Z8719 Personal history of other diseases of the digestive system: Secondary | ICD-10-CM | POA: Insufficient documentation

## 2013-06-25 DIAGNOSIS — Z8742 Personal history of other diseases of the female genital tract: Secondary | ICD-10-CM | POA: Insufficient documentation

## 2013-06-25 DIAGNOSIS — E119 Type 2 diabetes mellitus without complications: Secondary | ICD-10-CM | POA: Insufficient documentation

## 2013-06-25 DIAGNOSIS — J45909 Unspecified asthma, uncomplicated: Secondary | ICD-10-CM | POA: Insufficient documentation

## 2013-06-25 DIAGNOSIS — G35 Multiple sclerosis: Secondary | ICD-10-CM | POA: Insufficient documentation

## 2013-06-25 DIAGNOSIS — Z8669 Personal history of other diseases of the nervous system and sense organs: Secondary | ICD-10-CM | POA: Insufficient documentation

## 2013-06-25 DIAGNOSIS — I10 Essential (primary) hypertension: Secondary | ICD-10-CM | POA: Insufficient documentation

## 2013-06-25 LAB — CBC
HCT: 40.8 % (ref 36.0–46.0)
Hemoglobin: 14.5 g/dL (ref 12.0–15.0)
MCH: 31.5 pg (ref 26.0–34.0)
MCHC: 35.5 g/dL (ref 30.0–36.0)
MCV: 88.5 fL (ref 78.0–100.0)
Platelets: 311 10*3/uL (ref 150–400)
RBC: 4.61 MIL/uL (ref 3.87–5.11)
RDW: 12.6 % (ref 11.5–15.5)
WBC: 9.3 10*3/uL (ref 4.0–10.5)

## 2013-06-25 LAB — BASIC METABOLIC PANEL
BUN: 6 mg/dL (ref 6–23)
CO2: 24 mEq/L (ref 19–32)
Calcium: 9.3 mg/dL (ref 8.4–10.5)
Chloride: 105 mEq/L (ref 96–112)
Creatinine, Ser: 0.6 mg/dL (ref 0.50–1.10)
GFR calc Af Amer: >90 mL/min (ref >90)
GFR calc non Af Amer: >90 mL/min (ref >90)
Glucose, Bld: 95 mg/dL (ref 70–99)
Potassium: 4.1 mEq/L (ref 3.5–5.1)
Sodium: 138 mEq/L (ref 135–145)

## 2013-06-25 MED ORDER — FINGOLIMOD HCL 0.5 MG PO CAPS: 1.0000 | ORAL_CAPSULE | Freq: Every day | ORAL | Status: DC

## 2013-06-25 MED ORDER — OXYCODONE-ACETAMINOPHEN 5-325 MG PO TABS
1.0000 | ORAL_TABLET | Freq: Once | ORAL | Status: AC
  Administered 2013-06-25: 1 via ORAL
  Filled 2013-06-25: qty 1

## 2013-06-25 MED ORDER — SITAGLIPTIN PHOSPHATE 100 MG PO TABS: 100.0000 mg | ORAL_TABLET | Freq: Every day | ORAL | Status: DC

## 2013-06-25 MED ORDER — HYDROCODONE-ACETAMINOPHEN 5-325 MG PO TABS: 1.0000 | ORAL_TABLET | ORAL | Status: DC | PRN

## 2013-06-25 MED ORDER — PREGABALIN 100 MG PO CAPS: 100.0000 mg | ORAL_CAPSULE | Freq: Three times a day (TID) | ORAL | Status: DC

## 2013-06-25 MED ORDER — ALBUTEROL SULFATE HFA 108 (90 BASE) MCG/ACT IN AERS: 2.0000 | INHALATION_SPRAY | RESPIRATORY_TRACT | Status: DC | PRN

## 2013-06-25 MED ORDER — METFORMIN HCL 500 MG PO TABS: 500.0000 mg | ORAL_TABLET | Freq: Two times a day (BID) | ORAL | Status: DC

## 2013-06-25 NOTE — Progress Notes (Signed)
   CARE MANAGEMENT ED NOTE 06/25/2013  Patient:  [name], [name]   Account Number:  [id number]  Date Initiated:  06/25/2013  Documentation initiated by:  Fransico Michael  Subjective/Objective Assessment:   presented to ED with c/o weakness and fall     Subjective/Objective Assessment Detail:     Action/Plan:   disposition planning   Action/Plan Detail:   Anticipated DC Date:  06/25/2013     Status Recommendation to Physician:   Result of Recommendation:      DC Planning Services  CM consult  MATCH Program  [facility] / [facility] (established/new)   [facility] Choice  HOME HEALTH   Choice offered to / List presented to:  C-1 Patient     HH arranged  HH-1 RN  HH-10 DISEASE MANAGEMENT  HH-2 PT      HH agency  [facility]    Status of service:    ED Comments:   ED Comments Detail:  06/25/13-1313-J.Anneth Brunell,RN,BSN [phone]      In to give patient medicaid application with instructions. MATCH letter given to patient with instructions. Reiterrated to patient regarding elligibility once a rolling calendar year and about the $3 per med copay. Voiced understanding. No further needs identified. Patient to be discharged home today.  06/25/13-1309-J.Kalin Amrhein,RN,BSN [phone]      Spoke with Marcelino Duster in financial counseling. Received copy of Medicaid application for patient to fill out and either take to DSS or mail in.  06/25/13-1304-J.Bray Vickerman,RN,BSN [phone]      EDCM spoke with Madlyn Frankel liasion regarding patient situation. She will follow up. Also notified Donna,RN with [facility] for home health RN and PT per Dr. Patria Mane' order. Enrolled patient into [facility] program.  06/25/13-1253-J.Chabely Norby,RN,BSN [phone]      In to see patient. Reports having been to [facility] last week (7/18) and having received an application for orange card assistance from [place] liasion. States, " I have had to quit working in the last couple of weeks so when I went and turned in my application for orange card this morning  at the [facility] wellness [facility], they told me that they wouldn't be able to help me because my paycheck stubs were not recent. But they were the most recent ones I have." Spoke to patient about need for medication assistance and  potential home health needs. Reports having had [facility] in the past for IV infusion and would want them again. Informed patient of MATCH program and process. Voiced understanding.  06/25/13-1247-J.Alexsys Eskin,RN,BSN [phone]      Received referral from Dr. Patria Mane to see patient regarding disposition needs. Patient with MS diagnosed approx. 4 years ago having progressive weakness and fell this morning at [facility] where she went to get prescriptions for her meds.

## 2013-06-25 NOTE — ED Notes (Signed)
Heard a loud noise coming from pt's room as Fayrene Fearing, [place] was entering... Pt reports she fell off the wheelchair trying to throw away her tissue... Reports she hit the back of her head and feels dizzy.... Fayrene Fearing, [place] is aware.

## 2013-06-25 NOTE — ED Provider Notes (Signed)
History    CSN: [id number] Arrival date & time 06/25/13  1010  First MD Initiated Contact with Patient 06/25/13 1126     Chief Complaint  Patient presents with  . Multiple Sclerosis   (Consider location/radiation/quality/duration/timing/severity/associated sxs/prior Treatment) HPI  37 yo female presents for the above complaint.  States that she was seen in the ED about a week ago and I have reviewed those records.  She had mri or brain, cervical/thoracic/lumbar spine.  Continues to describe having pain all over and weakness in legs.  Also c/o bladder incontinence.  Falling frequently.  She has not seen neurologist Dr love in over a year because she can't afford it.  Was prescribed lyrica last week but again stated that she couldn't afford it at $400.   Right before I went into the examining room today the CMA and I heard a loud thump in the room.  Went in and the patient was on the floor.  States that she tried to get up from the chair to throw something in the trash and legs gave out.  Says that she hit her head.  No LOC.  C/o headache.  No vision changes, nausea, neck pain.  Says that her legs feel week and numb.   Past Medical History  Diagnosis Date  . Asthma   . Diabetes mellitus   . Hypertension   . PCOS (polycystic ovarian syndrome)   . Gastroparesis   . Polyneuropathy   . Multiple sclerosis     Dr. Anne Hahn   Past Surgical History  Procedure Laterality Date  . Cholecystectomy    . Dilation and curettage of uterus    . Laparoscopic gastric banding  01/26/08  . Cesarean section    . Iud removal  03/19/2012    Procedure: INTRAUTERINE DEVICE (IUD) REMOVAL;  Surgeon: Genia Del, MD;  Location: [facility];  Service: Gynecology;  Laterality: N/A;  . Colon surgery    . Abdominal hysterectomy     Family History  Problem Relation Age of Onset  . Diabetes Mother   . Depression Mother   . Hypertension Mother   . Liver disease Father   . Depression Daughter     bipolar  depression  . Coronary artery disease Other   . Cancer Other     ovarian  . Anesthesia problems Neg Hx    History  Substance Use Topics  . Smoking status: Never Smoker   . Smokeless tobacco: Never Used  . Alcohol Use: No   OB History   Grav Para Term Preterm Abortions TAB SAB Ect Mult Living   1 1 1     0 0 0 1     Review of Systems  Eyes: Negative.   Respiratory: Negative.   Cardiovascular: Negative.   Musculoskeletal: Positive for myalgias, back pain, arthralgias and gait problem.  Neurological: Positive for dizziness, weakness, light-headedness, numbness and headaches.    Allergies  Review of patient's allergies indicates no known allergies.  Home Medications   Current Outpatient Rx  Name  Route  Sig  Dispense  Refill  . acetaminophen (TYLENOL) 500 MG tablet   Oral   Take 1,000 mg by mouth every 6 (six) hours as needed for pain.         [name] oxyCODONE-acetaminophen (PERCOCET/ROXICET) 5-325 MG per tablet   Oral   Take 1 tablet by mouth every 4 (four) hours as needed for pain.   10 tablet   0   . pregabalin (LYRICA) 50 MG capsule  Oral   Take 1 capsule (50 mg total) by mouth 2 (two) times daily.   60 capsule   0    BP 160/100  Pulse 91  Temp(Src) 98.2 F (36.8 C) (Oral)  Resp 16  SpO2 99%  LMP 03/17/2012 Physical Exam  Constitutional: She appears well-developed and well-nourished.  HENT:  Head: Normocephalic and atraumatic.  Neck: Normal range of motion. Neck supple.  Pulmonary/Chest: Effort normal.  Skin: Skin is warm and dry.  Psychiatric: She has a normal mood and affect.    ED Course  Procedures (including critical care time) Labs Reviewed - No data to display No results found. 1. Multiple sclerosis     MDM  Patient was taken to ED by EMS for further evaluation.  Voices understanding.  Discussed with dr Denyse Amass.    Zonia Kief, PA-C 06/25/13 1137

## 2013-06-25 NOTE — ED Notes (Signed)
Pt sts she [facility] get [facility] for pain relief but there wasn't a provider available to see her to they suggested for her to go to the urgent care. Pt went then while she was there she was bending over in the wheelchair to throw something away and fell out of her chair. Pt denies LOC/head injury. Pt c/o generalized body aches X 3 days. Pt in nad, skin warm and dry, resp e/u.

## 2013-06-25 NOTE — ED Provider Notes (Signed)
Medical screening examination/treatment/procedure(s) were performed by a resident physician or non-physician practitioner and as the supervising physician I was immediately available for consultation/collaboration.  Clementeen Graham, MD   Rodolph Bong, MD 06/25/13 [phone]

## 2013-06-25 NOTE — ED Notes (Signed)
Pt c/o MS sxs flaring up onset 2 weeks... Reports going to [facility] ER last week; given Lyrica but she can't afford it; $400... sxs include: pain all over, urinary incontinence, falling frequently... Pt is tearing; alert w/no signs of acute distress.

## 2013-06-25 NOTE — Telephone Encounter (Signed)
Case manager "PG" from [facility] called Stating this patient supposedly was here this am Was told there was no dr. I said she did not have a  Scheduled  appt with [place]- nor was she here Did tell her she has an appt with [place] on 07/01/13 And gave her the address

## 2013-06-25 NOTE — ED Notes (Signed)
Pt is still feeling dizzy and having HA but was texting when rounding.

## 2013-06-25 NOTE — ED Notes (Addendum)
Pt reports having MS and going to [facility] to get med refill.  Pt states she stood and her legs just buckled.  Has happened 5X in last 2 weeks.  Pt hit the right side of head on floor when she fell today.  Pain 10/10 headache.  10/10 pain in legs and lower spine.  Pt denies N/V/D.  States lower arms feel numb.Pt has lost the ability to hold her bladder due to the MS.   Pt alert oriented X4

## 2013-06-25 NOTE — ED Notes (Signed)
Per [facility] - pt has had multiple falls at home. Pt has hx of MS. Pt c/o generalized pain. 10/10 X 3 days. Pt was trying to throw something away when she fell while she was at urgent care, they sent her down here for further evaluation. Pt sts she hasn't been taking her MS medications for about a year because she lost her insurance and hasn't been able to buy her meds. BP 140/84 HR 78 RR 18, 98% on room air.

## 2013-06-25 NOTE — ED Provider Notes (Signed)
History    CSN: [id number] Arrival date & time 06/25/13  1131  First MD Initiated Contact with Patient 06/25/13 1131     CC: fall, weakness, EMS   HPI Pt to ER after attempting to obtain maintenance prescriptions for MS and DM from the [facility]. Sent to urgent care where she fell resulting in no significant injury or head injury. Sent to ER. Hx of MS, out of meds x 1 year after losing insurance. Reports increasing generalized weakness. No nausea or  Vomiting. No HA. No other complaints. Reports difficulty getting around at home. Asks for wheel chair. Also reports she lives in a town home and her bedroom is upstairs, but main living area is on 1st floor. Recently seen by case manager and given an orange card and information about receiving medicaid. No pcp now. EDCM reports has appointment on the 30th of July at the community care clinic  Past Medical History  Diagnosis Date  . Asthma   . Diabetes mellitus   . Hypertension   . PCOS (polycystic ovarian syndrome)   . Gastroparesis   . Polyneuropathy   . Multiple sclerosis     Dr. Anne Hahn   Past Surgical History  Procedure Laterality Date  . Cholecystectomy    . Dilation and curettage of uterus    . Laparoscopic gastric banding  01/26/08  . Cesarean section    . Iud removal  03/19/2012    Procedure: INTRAUTERINE DEVICE (IUD) REMOVAL;  Surgeon: Genia Del, MD;  Location: [facility];  Service: Gynecology;  Laterality: N/A;  . Colon surgery    . Abdominal hysterectomy     Family History  Problem Relation Age of Onset  . Diabetes Mother   . Depression Mother   . Hypertension Mother   . Liver disease Father   . Depression Daughter     bipolar depression  . Coronary artery disease Other   . Cancer Other     ovarian  . Anesthesia problems Neg Hx    History  Substance Use Topics  . Smoking status: Never Smoker   . Smokeless tobacco: Never Used  . Alcohol Use: No   OB History   Grav Para Term Preterm Abortions  TAB SAB Ect Mult Living   1 1 1     0 0 0 1     Review of Systems  All other systems reviewed and are negative.    Allergies  Review of patient's allergies indicates no known allergies.  Home Medications  No current outpatient prescriptions on file. BP 151/97  Pulse 75  Temp(Src) 98.5 F (36.9 C)  SpO2 100%  LMP 03/17/2012 Physical Exam  Nursing note and vitals reviewed. Constitutional: She is oriented to person, place, and time. She appears well-developed and well-nourished. No distress.  HENT:  Head: Normocephalic and atraumatic.  Eyes: EOM are normal.  Neck: Normal range of motion.  Cardiovascular: Normal rate, regular rhythm and normal heart sounds.   Pulmonary/Chest: Effort normal and breath sounds normal.  Abdominal: Soft. She exhibits no distension. There is no tenderness.  Musculoskeletal: Normal range of motion.  Neurological: She is alert and oriented to person, place, and time.  Skin: Skin is warm and dry.  Psychiatric: She has a normal mood and affect. Judgment normal.    ED Course  Procedures (including critical care time) Labs Reviewed  CBC  BASIC METABOLIC PANEL   No results found. 1. Multiple sclerosis     MDM  Case manager involved. Will provide  home health and prescriptions for 90 days for maintenance medications. Home health RN, SW, PT. Please see case manager notes for complete details  3:05 PM Patient was seen and evaluated by case manager and Child psychotherapist.  Outpatient medications were arranged as well as home health services.  [facility] care clinic was also contacted if her appointment next week was confirmed.  Patient is given prescriptions for all of her medications which she will fill today through the hospital medication assistance program.  Discharge home in good condition.  Lyanne Co, MD 06/25/13 [phone]

## 2013-06-30 ENCOUNTER — Emergency Department (HOSPITAL_COMMUNITY)
Admission: EM | Admit: 2013-06-30 | Discharge: 2013-06-30 | Disposition: A | Discharge status: Home / Self Care | Payer: Self-pay | Attending: Emergency Medicine | Admitting: Emergency Medicine

## 2013-06-30 ENCOUNTER — Encounter (HOSPITAL_COMMUNITY): Payer: Self-pay | Admitting: *Deleted

## 2013-06-30 DIAGNOSIS — Z8639 Personal history of other endocrine, nutritional and metabolic disease: Secondary | ICD-10-CM | POA: Insufficient documentation

## 2013-06-30 DIAGNOSIS — R5381 Other malaise: Secondary | ICD-10-CM | POA: Insufficient documentation

## 2013-06-30 DIAGNOSIS — R32 Unspecified urinary incontinence: Secondary | ICD-10-CM | POA: Insufficient documentation

## 2013-06-30 DIAGNOSIS — G35 Multiple sclerosis: Secondary | ICD-10-CM | POA: Insufficient documentation

## 2013-06-30 DIAGNOSIS — R159 Full incontinence of feces: Secondary | ICD-10-CM | POA: Insufficient documentation

## 2013-06-30 DIAGNOSIS — I10 Essential (primary) hypertension: Secondary | ICD-10-CM | POA: Insufficient documentation

## 2013-06-30 DIAGNOSIS — R5383 Other fatigue: Secondary | ICD-10-CM | POA: Insufficient documentation

## 2013-06-30 DIAGNOSIS — G35D Multiple sclerosis, unspecified: Secondary | ICD-10-CM | POA: Diagnosis present

## 2013-06-30 DIAGNOSIS — R209 Unspecified disturbances of skin sensation: Secondary | ICD-10-CM | POA: Insufficient documentation

## 2013-06-30 DIAGNOSIS — Z862 Personal history of diseases of the blood and blood-forming organs and certain disorders involving the immune mechanism: Secondary | ICD-10-CM | POA: Insufficient documentation

## 2013-06-30 DIAGNOSIS — E119 Type 2 diabetes mellitus without complications: Secondary | ICD-10-CM | POA: Insufficient documentation

## 2013-06-30 DIAGNOSIS — R112 Nausea with vomiting, unspecified: Secondary | ICD-10-CM | POA: Insufficient documentation

## 2013-06-30 DIAGNOSIS — Z8669 Personal history of other diseases of the nervous system and sense organs: Secondary | ICD-10-CM | POA: Insufficient documentation

## 2013-06-30 DIAGNOSIS — J45909 Unspecified asthma, uncomplicated: Secondary | ICD-10-CM | POA: Insufficient documentation

## 2013-06-30 DIAGNOSIS — Z79899 Other long term (current) drug therapy: Secondary | ICD-10-CM | POA: Insufficient documentation

## 2013-06-30 DIAGNOSIS — Z8719 Personal history of other diseases of the digestive system: Secondary | ICD-10-CM | POA: Insufficient documentation

## 2013-06-30 LAB — URINALYSIS, ROUTINE W REFLEX MICROSCOPIC
Bilirubin Urine: NEGATIVE
Glucose, UA: NEGATIVE mg/dL
Hgb urine dipstick: NEGATIVE
Ketones, ur: NEGATIVE mg/dL
Leukocytes, UA: NEGATIVE
Nitrite: NEGATIVE
Protein, ur: NEGATIVE mg/dL
Specific Gravity, Urine: 1.01 (ref 1.005–1.030)
Urobilinogen, UA: 0.2 mg/dL (ref 0.0–1.0)
pH: 5.5 (ref 5.0–8.0)

## 2013-06-30 MED ORDER — PREDNISONE (PAK) 10 MG PO TABS: 20.0000 mg | ORAL_TABLET | Freq: Every day | ORAL | Status: DC

## 2013-06-30 MED ORDER — HYDROMORPHONE HCL PF 1 MG/ML IJ SOLN
0.5000 mg | Freq: Once | INTRAMUSCULAR | Status: AC
  Administered 2013-06-30: 0.5 mg via INTRAMUSCULAR
  Filled 2013-06-30: qty 1

## 2013-06-30 MED ORDER — HYDROCODONE-ACETAMINOPHEN 5-325 MG PO TABS: 1.0000 | ORAL_TABLET | Freq: Four times a day (QID) | ORAL | Status: DC | PRN

## 2013-06-30 MED ORDER — ONDANSETRON 4 MG PO TBDP
ORAL_TABLET | ORAL | Status: AC
  Filled 2013-06-30: qty 2

## 2013-06-30 MED ORDER — METHYLPREDNISOLONE SODIUM SUCC 125 MG IJ SOLR
125.0000 mg | Freq: Once | INTRAMUSCULAR | Status: AC
  Administered 2013-06-30: 125 mg via INTRAMUSCULAR
  Filled 2013-06-30: qty 2

## 2013-06-30 MED ORDER — ONDANSETRON 4 MG PO TBDP
4.0000 mg | ORAL_TABLET | Freq: Once | ORAL | Status: AC
  Administered 2013-06-30: 4 mg via ORAL

## 2013-06-30 NOTE — ED Provider Notes (Signed)
CSN: [id number]     Arrival date & time 06/30/13  0747 History     First MD Initiated Contact with Patient 06/30/13 [phone]     Chief Complaint  Patient presents with  . Pain   (Consider location/radiation/quality/duration/timing/severity/associated sxs/prior Treatment) HPI Biviana Saddler 37 y.o. with a history of multiple sclerosis presents emergency department for MS flair. She is reporting symptoms in her usual of her flares. Symptoms include lower extremity weakness, nausea, generalized myalgias, and bowel and bladder incontinence. Again the symptoms are usual for her flares. She is currently not being followed by a neurologist that she does not currently have insurance. Pain is aching in character, worse fell approximately her legs, nonradiating, worsened with walking or bending, no known relieving factors, moderate in severity. She did have a MRI of her brain C-spine, thoracic spine, and lumbar spine with the past month, which showed no evidence of neurologic compromise. She denies spasm. She denies shortness of breath, cough, fever, chest pain. She also denies dysuria or frequency.   Past Medical History  Diagnosis Date  . Asthma   . Diabetes mellitus   . Hypertension   . PCOS (polycystic ovarian syndrome)   . Gastroparesis   . Polyneuropathy   . Multiple sclerosis     Dr. Anne Hahn   Past Surgical History  Procedure Laterality Date  . Cholecystectomy    . Dilation and curettage of uterus    . Laparoscopic gastric banding  01/26/08  . Cesarean section    . Iud removal  03/19/2012    Procedure: INTRAUTERINE DEVICE (IUD) REMOVAL;  Surgeon: Genia Del, MD;  Location: [facility];  Service: Gynecology;  Laterality: N/A;  . Colon surgery    . Abdominal hysterectomy     Family History  Problem Relation Age of Onset  . Diabetes Mother   . Depression Mother   . Hypertension Mother   . Liver disease Father   . Depression Daughter     bipolar depression  . Coronary artery disease Other    . Cancer Other     ovarian  . Anesthesia problems Neg Hx    History  Substance Use Topics  . Smoking status: Never Smoker   . Smokeless tobacco: Never Used  . Alcohol Use: No   OB History   Grav Para Term Preterm Abortions TAB SAB Ect Mult Living   1 1 1     0 0 0 1     Review of Systems  Constitutional: Negative for fever, chills, diaphoresis, activity change and appetite change.  HENT: Negative for sore throat, rhinorrhea, sneezing, drooling and trouble swallowing.   Eyes: Negative for discharge and redness.  Respiratory: Negative for cough, chest tightness, shortness of breath, wheezing and stridor.   Cardiovascular: Negative for chest pain and leg swelling.  Gastrointestinal: Positive for nausea and vomiting. Negative for abdominal pain, diarrhea, constipation and blood in stool.  Genitourinary: Negative for difficulty urinating.       Urinary incontinence  Musculoskeletal: Negative for myalgias and arthralgias.  Skin: Negative for pallor.  Neurological: Positive for weakness and numbness. Negative for dizziness, syncope, speech difficulty, light-headedness and headaches.  Hematological: Negative for adenopathy. Does not bruise/bleed easily.  Psychiatric/Behavioral: Negative for confusion and agitation.    Allergies  Review of patient's allergies indicates no known allergies.  Home Medications   Current Outpatient Rx  Name  Route  Sig  Dispense  Refill  . HYDROcodone-acetaminophen (NORCO/VICODIN) 5-325 MG per tablet   Oral   Take 1 tablet  by mouth every 4 (four) hours as needed for pain.   15 tablet   0   . metFORMIN (GLUCOPHAGE) 500 MG tablet   Oral   Take 1 tablet (500 mg total) by mouth 2 (two) times daily with a meal.   60 tablet   3   . pregabalin (LYRICA) 100 MG capsule   Oral   Take 1 capsule (100 mg total) by mouth 3 (three) times daily.   90 capsule   3   . sitaGLIPtin (JANUVIA) 100 MG tablet   Oral   Take 1 tablet (100 mg total) by mouth  daily.   30 tablet   3   . albuterol (PROVENTIL HFA;VENTOLIN HFA) 108 (90 BASE) MCG/ACT inhaler   Inhalation   Inhale 2 puffs into the lungs every 4 (four) hours as needed for wheezing.   1 Inhaler   0   . Fingolimod HCl (GILENYA) 0.5 MG CAPS   Oral   Take 1 capsule (0.5 mg total) by mouth daily.   30 capsule   3   . HYDROcodone-acetaminophen (NORCO) 5-325 MG per tablet   Oral   Take 1 tablet by mouth every 6 (six) hours as needed for pain.   20 tablet   0   . predniSONE (STERAPRED UNI-PAK) 10 MG tablet   Oral   Take 2 tablets (20 mg total) by mouth daily. Decrease dose to 10 mg (1 pill) on August 5th, and continue 10 mg for 1 week.   21 tablet   0    BP 121/84  Pulse 78  Temp(Src) 97.3 F (36.3 C) (Oral)  Resp 18  SpO2 96%  LMP 03/17/2012 Physical Exam  Constitutional: She is oriented to person, place, and time. She appears well-developed and well-nourished. She appears distressed (Appears nauseated).  HENT:  Head: Normocephalic and atraumatic.  Right Ear: External ear normal.  Left Ear: External ear normal.  Eyes: Conjunctivae and EOM are normal. Right eye exhibits no discharge. Left eye exhibits no discharge.  Neck: Normal range of motion. Neck supple. No JVD present.  Cardiovascular: Normal rate, regular rhythm and normal heart sounds.  Exam reveals no gallop and no friction rub.   No murmur heard. Pulmonary/Chest: Effort normal and breath sounds normal. No stridor. No respiratory distress. She has no wheezes. She has no rales. She exhibits no tenderness.  Abdominal: Soft. Bowel sounds are normal. She exhibits no distension. There is no tenderness. There is no rebound and no guarding.  Musculoskeletal: Normal range of motion. She exhibits no edema.  Neurological: She is alert and oriented to person, place, and time. GCS eye subscore is 4. GCS verbal subscore is 5. GCS motor subscore is 6. She displays no Babinski's sign on the right side. She displays no  Babinski's sign on the left side.  Patient freely involuntarily moves all 4 extremities Hip flexor strength 3/5 bilaterally and symmetric Dorsiflexion strength 4/5 bilaterally and symmetric Plantar flexion strength 4-5 bilaterally and symmetric  Skin: Skin is warm. No rash noted. She is not diaphoretic.  Psychiatric: She has a normal mood and affect. Her behavior is normal.    ED Course   Procedures (including critical care time)  Labs Reviewed  URINALYSIS, ROUTINE W REFLEX MICROSCOPIC   Results for orders placed during the hospital encounter of 06/30/13  URINALYSIS, ROUTINE W REFLEX MICROSCOPIC      Result Value Range   Color, Urine YELLOW  YELLOW   APPearance CLEAR  CLEAR   Specific Gravity, Urine  1.010  1.005 - 1.030   pH 5.5  5.0 - 8.0   Glucose, UA NEGATIVE  NEGATIVE mg/dL   Hgb urine dipstick NEGATIVE  NEGATIVE   Bilirubin Urine NEGATIVE  NEGATIVE   Ketones, ur NEGATIVE  NEGATIVE mg/dL   Protein, ur NEGATIVE  NEGATIVE mg/dL   Urobilinogen, UA 0.2  0.0 - 1.0 mg/dL   Nitrite NEGATIVE  NEGATIVE   Leukocytes, UA NEGATIVE  NEGATIVE    No results found. 1. Multiple sclerosis exacerbation     MDM  Sharolyn Weber 37 y.o. presents for acute MS flair. She reports symptoms are usual but increased in severity. MRI brain, C-spine, T-spine, and L-spine show no neurologic impingement or frank compromise within the recent past. Patient does report urinary incontinence and one episode of bowel incontinence, but is normal for typical MS flares for her. Afebrile vital signs stable. She is nauseated. No focal deficits on exam. She is moving all 4 extremities freely involuntarily. Pain controlled with intramuscular dilaudid. Her UA due to urinary symptoms to rule out infection as this can be a precipitant of a flare. Also neurology consultation indicated.  I spoke to Dr. Roseanne Reno with neurology and discussed the case in detail with him, who indicated patient should probably followup at [facility]  clinic where she could be seen sooner. He also indicated that a tapered dose of steroids over the next 2 weeks as indicated as well. If she develops spasms she may benefit from baclofen.  No evidence of UTI. Symptoms improved. She was given also a dose of steroids in the emergency department. She was given a prescription for 2 weeks of steroids, and instructed to taper after one week. She is also given a prescription for Norco for pain control. She was given strong return precautions. She is given instructions to followup with the [facility] if he cannot obtain insurance in the near future as these institutions can see indigent populations. She agreed with this plan. Was discharged on event.  Labs and previous imaging studies reviewed. I discussed patient's care at my attending, Dr. Radford Pax.   Sena Hitch, MD 06/30/13 [phone]

## 2013-06-30 NOTE — ED Notes (Signed)
Pt reports having MS. No relief with pain meds at home. Over past two days has also developed left drop foot and incontinent of urine and bowel.

## 2013-07-01 ENCOUNTER — Ambulatory Visit: Payer: Self-pay

## 2013-07-01 NOTE — ED Provider Notes (Signed)
I saw and evaluated the patient, reviewed the resident's note and I agree with the findings and plan.   .Face to face Exam:  General:  Awake HEENT:  Atraumatic Resp:  Normal effort Abd:  Nondistended Neuro:No focal weakness  Nelia Shi, MD 07/01/13 [phone]

## 2013-07-03 ENCOUNTER — Telehealth: Payer: Self-pay | Admitting: Internal Medicine

## 2013-07-03 DIAGNOSIS — G35 Multiple sclerosis: Secondary | ICD-10-CM

## 2013-07-03 MED ORDER — GLUCOSE BLOOD VI STRP: 1.0000 | ORAL_STRIP | Freq: Every day | Status: DC

## 2013-07-03 NOTE — Telephone Encounter (Signed)
Pt will be going on medicaid soon. Pt is requesting referral to [facility] for DX MS. Pt can no longer see Dr Anne Hahn due to insurance.

## 2013-07-03 NOTE — Telephone Encounter (Signed)
Referral order placed.

## 2013-07-03 NOTE — Telephone Encounter (Signed)
Ok for neurology referral to [facility] re: multiple sclerosis

## 2013-07-03 NOTE — Telephone Encounter (Signed)
Meter up front for p/u, test strips called in to [facility] [address], pt aware

## 2013-07-03 NOTE — Telephone Encounter (Signed)
Pt does not have a glucometer nor test strips to test her BS. Pt can not afford one. Consuella Lose will have pt call our office for free meter etc

## 2013-07-06 ENCOUNTER — Telehealth: Payer: Self-pay | Admitting: Internal Medicine

## 2013-07-06 NOTE — Telephone Encounter (Signed)
I think Arline Asp already put in order for referral to [facility] neuro.  I don't anything about "BP" cuffs.  All I can do is provide prescription for automated cuff.

## 2013-07-06 NOTE — Telephone Encounter (Signed)
Nurse states that the PT is waiting on an orange card and medicaid. She states that the pt's mobility has been increased, but that the pt is afraid she'll go back to the way she was. She states that the pt needs a referral to see [facility] Neuro dept. because they are willing to see her with no insurance. She is also requesting a BP cuff. Please assist.

## 2013-07-07 ENCOUNTER — Telehealth: Payer: Self-pay | Admitting: Internal Medicine

## 2013-07-07 NOTE — Telephone Encounter (Signed)
appt was scheduled for Thursday

## 2013-07-07 NOTE — Telephone Encounter (Signed)
See when pt wants to come in next week and just have [facility] open the slot up on his schedule

## 2013-07-07 NOTE — Telephone Encounter (Signed)
appt scheduled for Thursday with Dr Artist Pais

## 2013-07-07 NOTE — Telephone Encounter (Signed)
Pt has been in and out of hospital in the past 3 weeks. Pt would like to come in for a OV next week, but no available. Pt would like to know if it is OK to see someone else, or do you want to work in somewhere? Pls advise.

## 2013-07-08 ENCOUNTER — Telehealth: Payer: Self-pay

## 2013-07-08 NOTE — Telephone Encounter (Signed)
Marcelino Duster from [facility] called, left a message saying the patient contacted them stating she wants to be started on Rebif Rebidose.  Call back number with any questions is [phone].  Would you like to prescribe?  Please advise.  Thank you.

## 2013-07-08 NOTE — Telephone Encounter (Signed)
I will write the prescription for the Rebidose for the Rebif.

## 2013-07-09 ENCOUNTER — Telehealth: Payer: Self-pay | Admitting: Internal Medicine

## 2013-07-09 ENCOUNTER — Encounter: Payer: Self-pay | Admitting: Internal Medicine

## 2013-07-09 ENCOUNTER — Ambulatory Visit (INDEPENDENT_AMBULATORY_CARE_PROVIDER_SITE_OTHER): Payer: Self-pay | Admitting: Internal Medicine

## 2013-07-09 VITALS — BP 124/90 | HR 68 | Temp 98.4°F | Wt 225.0 lb

## 2013-07-09 DIAGNOSIS — R413 Other amnesia: Secondary | ICD-10-CM

## 2013-07-09 DIAGNOSIS — G35 Multiple sclerosis: Secondary | ICD-10-CM

## 2013-07-09 DIAGNOSIS — R209 Unspecified disturbances of skin sensation: Secondary | ICD-10-CM

## 2013-07-09 DIAGNOSIS — E119 Type 2 diabetes mellitus without complications: Secondary | ICD-10-CM

## 2013-07-09 DIAGNOSIS — R32 Unspecified urinary incontinence: Secondary | ICD-10-CM

## 2013-07-09 MED ORDER — GLUCOSE BLOOD VI STRP: 1.0000 | ORAL_STRIP | Freq: Every day | Status: DC

## 2013-07-09 MED ORDER — ACCU-CHEK SOFTCLIX LANCETS MISC: 1.0000 | Freq: Every day | Status: DC

## 2013-07-09 NOTE — Progress Notes (Signed)
Subjective:    Patient ID: [name], female    DOB: 1976/08/06, 37 y.o.   MRN: [id number]  HPI  37 year old female with history of transverse myelitis/multiple sclerosis, type 2 diabetes and asthma for emergency room followup. It has been greater than 12 months since her previous followup visit. She reports loss of medical insurance since August of 2013. Patient reports she was doing fairly well in regards to her MS until 2-3 weeks ago. She suffered from multiple falls. She reports 8 major falls in total. She is also experiencing severe pain along her spinal column starting in her shoulder blades down her spine radiating into her legs. She has difficulty walking.  She also reports bowel and bladder incontinence.  She has had chronic right-sided weakness since initial multiple sclerosis diagnosis. She reports right foot drop and weakness in her right hand. Patient also reports visual changes. She has blurriness in the periphery of visual fields bilaterally.  She was seen in the emergency room by Dr. Patria Mane. She reports receiving steroid injection as well as high dose prednisone for 5 days. There has not been any significant improvement in her symptoms. She was restarted on Lyrica 100 mg 3 times a day. Lyrica causing some grogginess but also has not resulted in significant pain relief.    Patient recently restarted checking her blood sugars. Her blood sugars fasting in the morning have been less than 100 for last 2 days. She denies polyuria or polydipsia.  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 She denies any specific trigger. She wonders whether some of her symptoms triggered by stress of working in hot environment.  Patient underwent MRI on 06/19/2013. Patient reports this was 2 weeks into her exacerbation. MRI of brain showed no acute intracranial abnormality. She has chronic abnormal signal in the periventricular white matter and right thalamus not  significantly changed since 2011. No evidence of acute demyelination.  C-spine-stable cervical spine but no definite spinal cord signal abnormality to suggest cord demyelination. Mild chronic lower cervical disc degeneration maximal at C5-C6.  Thoracic spine normal thoracic spinal cord and no thoracic osseous abnormality. Minimal to mild thoracic spine disc and facet degeneration. No spinal stenosis or neural impingement.  Lumbar spine mild L4-L5 disc degeneration with small disc herniation. This given the proximity of the descending L5 nerve roots, more so the left. Otherwise negative MRI of lumbar spine.  Review of Systems Patient also experiencing significant cognitive impairment. Patient disorganized and unable to focus. Patient also complains of short-term memory loss    Past Medical History  Diagnosis Date  . Asthma   . Diabetes mellitus   . Hypertension   . PCOS (polycystic ovarian syndrome)   . Gastroparesis   . Polyneuropathy   . Multiple sclerosis     Dr. Anne Hahn    History   Social History  . Marital Status: Married    Spouse Name: Barbara Cower    Number of Children: N/A  . Years of Education: N/A   Occupational History  . 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 Smoking status: Never Smoker   . Smokeless tobacco: Never Used  . Alcohol Use: No  . Drug Use: No  . Sexually Active: Not Currently    Birth Control/ Protection: Surgical   Other Topics Concern  . Not on file   Social History Narrative  . No narrative on file    Past Surgical History  Procedure Laterality Date  .  Cholecystectomy    . Dilation and curettage of uterus    . Laparoscopic gastric banding  01/26/08  . Cesarean section    . Iud removal  03/19/2012    Procedure: INTRAUTERINE DEVICE (IUD) REMOVAL;  Surgeon: Genia Del, MD;  Location: [facility];  Service: Gynecology;  Laterality: N/A;  . Colon surgery    . Abdominal hysterectomy      Family History  Problem Relation Age of Onset   . Diabetes Mother   . Depression Mother   . Hypertension Mother   . Liver disease Father   . Depression Daughter     bipolar depression  . Coronary artery disease Other   . Cancer Other     ovarian  . Anesthesia problems Neg Hx     No Known Allergies  Current Outpatient Prescriptions on File Prior to Visit  Medication Sig Dispense Refill  . albuterol (PROVENTIL HFA;VENTOLIN HFA) 108 (90 BASE) MCG/ACT inhaler Inhale 2 puffs into the lungs every 4 (four) hours as needed for wheezing.  1 Inhaler  0  . HYDROcodone-acetaminophen (NORCO/VICODIN) 5-325 MG per tablet Take 1 tablet by mouth every 4 (four) hours as needed for pain.  15 tablet  0  . metFORMIN (GLUCOPHAGE) 500 MG tablet Take 1 tablet (500 mg total) by mouth 2 (two) times daily with a meal.  60 tablet  3  . pregabalin (LYRICA) 100 MG capsule Take 1 capsule (100 mg total) by mouth 3 (three) times daily.  90 capsule  3  . sitaGLIPtin (JANUVIA) 100 MG tablet Take 1 tablet (100 mg total) by mouth daily.  30 tablet  3  . Fingolimod HCl (GILENYA) 0.5 MG CAPS Take 1 capsule (0.5 mg total) by mouth daily.  30 capsule  3   No current facility-administered medications on file prior to visit.    BP 124/90  Pulse 68  Temp(Src) 98.4 F (36.9 C) (Oral)  Wt 225 lb (102.059 kg)  BMI 39.87 kg/m2  LMP 03/17/2012    Objective:   Physical Exam  Constitutional: She is oriented to person, place, and time. She appears well-developed and well-nourished.  HENT:  Head: Normocephalic and atraumatic.  Right Ear: External ear normal.  Left Ear: External ear normal.  Mouth/Throat: Oropharynx is clear and moist.  Eyes: Conjunctivae and EOM are normal. Pupils are equal, round, and reactive to light.  Negative swinging flash light test  Neck: Neck supple.  Cardiovascular: Normal rate, regular rhythm and normal heart sounds.   No murmur heard. Pulmonary/Chest: Effort normal and breath sounds normal. She has no wheezes.  Abdominal: Soft. Bowel  sounds are normal.  Musculoskeletal: She exhibits no edema.  Lymphadenopathy:    She has no cervical adenopathy.  Neurological: She is alert and oriented to person, place, and time. She has normal reflexes. She displays normal reflexes. No cranial nerve deficit. She exhibits normal muscle tone.  Abnormal gait.  Right leg weakness.  Right arm/hand weakness. Right foot drop  Skin: Skin is warm and dry.  Psychiatric: She has a normal mood and affect. Her behavior is normal.          Assessment & Plan:

## 2013-07-09 NOTE — Assessment & Plan Note (Signed)
37 year old female with history of transverse myelitis presumed secondary to multiple sclerosis experiencing exacerbation of symptoms. It is confounding.her MRI of brain and spine are normal. Patient has upcoming appointment with neurologist at [facility] to further explore other causes of her symptoms. Check ANA, sed rate, HIV antibody, B12 level, RPR, Lyme titers, serum copper and ceruloplasmin levels.  Maintain same dose of Lyrica for now. I am reluctant to add additional neurologic agents at this time given lack of definitive diagnosis.  Arrange functional assessment by physical therapy.

## 2013-07-09 NOTE — Telephone Encounter (Signed)
Nurse wants to verify how often the patient should be checking her sugar through out the day, she is also requesting further orders to see her for once a week for 4 weeks. Please assist.

## 2013-07-09 NOTE — Assessment & Plan Note (Signed)
Fortunately patient has not had severe hyperglycemia secondary to prednisone use. Continue to monitor blood sugars at home. Check A1c.  We discussed using insulin therapy should she develop significant hyperglycemia.

## 2013-07-10 ENCOUNTER — Other Ambulatory Visit (INDEPENDENT_AMBULATORY_CARE_PROVIDER_SITE_OTHER): Payer: Self-pay

## 2013-07-10 ENCOUNTER — Telehealth: Payer: Self-pay | Admitting: Internal Medicine

## 2013-07-10 DIAGNOSIS — R32 Unspecified urinary incontinence: Secondary | ICD-10-CM

## 2013-07-10 DIAGNOSIS — G35 Multiple sclerosis: Secondary | ICD-10-CM

## 2013-07-10 DIAGNOSIS — E119 Type 2 diabetes mellitus without complications: Secondary | ICD-10-CM

## 2013-07-10 DIAGNOSIS — R413 Other amnesia: Secondary | ICD-10-CM

## 2013-07-10 DIAGNOSIS — R209 Unspecified disturbances of skin sensation: Secondary | ICD-10-CM

## 2013-07-10 LAB — VITAMIN B12: Vitamin B-12: 291 pg/mL (ref 211–911)

## 2013-07-10 LAB — SEDIMENTATION RATE: Sed Rate: 22 mm/hr (ref 0–22)

## 2013-07-10 LAB — T4, FREE: Free T4: 0.82 ng/dL (ref 0.60–1.60)

## 2013-07-10 LAB — HEMOGLOBIN A1C: Hgb A1c MFr Bld: 6.1 % (ref 4.6–6.5)

## 2013-07-10 LAB — TSH: TSH: 0.95 u[IU]/mL (ref 0.35–5.50)

## 2013-07-10 NOTE — Telephone Encounter (Signed)
Sallly Call: Follows office visit 07/09/13, states was told to continue Metformin 500 mg BID, blood sugar 80 @ 0730 before breakfast, felt faint; ate cereal.  Wants to know if she needs to continue this med/change dosage?  Please call.

## 2013-07-10 NOTE — Addendum Note (Signed)
Addended by: Alfred Levins D on: 07/10/2013 10:50 AM   Modules accepted: Orders

## 2013-07-10 NOTE — Telephone Encounter (Signed)
Does she think the metformin in making her feel faint.  If not, she can continue taking.  She can hold Januvia.

## 2013-07-10 NOTE — Telephone Encounter (Signed)
Check fasting AM blood sugar once daily.  OK for order for home nurse to see pt once weekly.  Also ok for more frequent visits if needed

## 2013-07-10 NOTE — Telephone Encounter (Signed)
Unsure if metformin making her feel faint, she will hold Januvia

## 2013-07-10 NOTE — Telephone Encounter (Signed)
Nurse aware 

## 2013-07-11 LAB — RPR

## 2013-07-11 LAB — CERULOPLASMIN: Ceruloplasmin: 26 mg/dL (ref 20–60)

## 2013-07-11 LAB — HIV ANTIBODY (ROUTINE TESTING W REFLEX): HIV: NONREACTIVE

## 2013-07-12 LAB — B. BURGDORFI ANTIBODIES BY WB
B burgdorferi IgG Abs (IB): NEGATIVE
B burgdorferi IgM Abs (IB): NEGATIVE

## 2013-07-13 LAB — B. BURGDORFI ANTIBODIES: B burgdorferi Ab IgG+IgM: 0.31 {ISR}

## 2013-07-13 LAB — ANA: Anti Nuclear Antibody(ANA): NEGATIVE

## 2013-07-14 LAB — COPPER, SERUM: Copper: 93 ug/dL (ref 70–175)

## 2013-07-20 NOTE — Telephone Encounter (Signed)
She can hold both meds if any of them causing side effects

## 2013-07-22 ENCOUNTER — Telehealth: Payer: Self-pay | Admitting: Internal Medicine

## 2013-07-22 NOTE — Telephone Encounter (Signed)
Mailbox full, unable to leave message

## 2013-07-22 NOTE — Telephone Encounter (Signed)
Consuella Lose requesting a call back by the end of the day. She needs to discuss the patient's status and get feedback.  Did not elaborate.

## 2013-07-23 ENCOUNTER — Emergency Department (HOSPITAL_COMMUNITY): Payer: No Typology Code available for payment source

## 2013-07-23 ENCOUNTER — Emergency Department (HOSPITAL_COMMUNITY)
Admission: EM | Admit: 2013-07-23 | Discharge: 2013-07-24 | Disposition: A | Discharge status: Home / Self Care | Payer: No Typology Code available for payment source | Attending: Emergency Medicine | Admitting: Emergency Medicine

## 2013-07-23 ENCOUNTER — Ambulatory Visit: Payer: No Typology Code available for payment source | Admitting: Rehabilitation

## 2013-07-23 ENCOUNTER — Encounter (HOSPITAL_COMMUNITY): Payer: Self-pay | Admitting: *Deleted

## 2013-07-23 DIAGNOSIS — E119 Type 2 diabetes mellitus without complications: Secondary | ICD-10-CM | POA: Insufficient documentation

## 2013-07-23 DIAGNOSIS — G35 Multiple sclerosis: Secondary | ICD-10-CM | POA: Insufficient documentation

## 2013-07-23 DIAGNOSIS — I10 Essential (primary) hypertension: Secondary | ICD-10-CM | POA: Insufficient documentation

## 2013-07-23 DIAGNOSIS — J45909 Unspecified asthma, uncomplicated: Secondary | ICD-10-CM | POA: Insufficient documentation

## 2013-07-23 DIAGNOSIS — Z8742 Personal history of other diseases of the female genital tract: Secondary | ICD-10-CM | POA: Insufficient documentation

## 2013-07-23 DIAGNOSIS — Z79899 Other long term (current) drug therapy: Secondary | ICD-10-CM | POA: Insufficient documentation

## 2013-07-23 DIAGNOSIS — Z8719 Personal history of other diseases of the digestive system: Secondary | ICD-10-CM | POA: Insufficient documentation

## 2013-07-23 DIAGNOSIS — G609 Hereditary and idiopathic neuropathy, unspecified: Secondary | ICD-10-CM | POA: Insufficient documentation

## 2013-07-23 LAB — CBC WITH DIFFERENTIAL/PLATELET
Basophils Absolute: 0 10*3/uL (ref 0.0–0.1)
Basophils Relative: 0 % (ref 0–1)
Eosinophils Absolute: 0.2 10*3/uL (ref 0.0–0.7)
Eosinophils Relative: 2 % (ref 0–5)
HCT: 39.4 % (ref 36.0–46.0)
Hemoglobin: 14.5 g/dL (ref 12.0–15.0)
Lymphocytes Relative: 46 % (ref 12–46)
Lymphs Abs: 3.1 10*3/uL (ref 0.7–4.0)
MCH: 32.2 pg (ref 26.0–34.0)
MCHC: 36.8 g/dL — ABNORMAL HIGH (ref 30.0–36.0)
MCV: 87.6 fL (ref 78.0–100.0)
Monocytes Absolute: 0.6 10*3/uL (ref 0.1–1.0)
Monocytes Relative: 9 % (ref 3–12)
Neutro Abs: 2.8 10*3/uL (ref 1.7–7.7)
Neutrophils Relative %: 43 % (ref 43–77)
Platelets: 319 10*3/uL (ref 150–400)
RBC: 4.5 MIL/uL (ref 3.87–5.11)
RDW: 12.5 % (ref 11.5–15.5)
WBC: 6.6 10*3/uL (ref 4.0–10.5)

## 2013-07-23 LAB — COMPREHENSIVE METABOLIC PANEL
ALT: 55 U/L — ABNORMAL HIGH (ref 0–35)
AST: 31 U/L (ref 0–37)
Albumin: 4 g/dL (ref 3.5–5.2)
Alkaline Phosphatase: 57 U/L (ref 39–117)
BUN: 6 mg/dL (ref 6–23)
CO2: 23 mEq/L (ref 19–32)
Calcium: 9.2 mg/dL (ref 8.4–10.5)
Chloride: 102 mEq/L (ref 96–112)
Creatinine, Ser: 0.58 mg/dL (ref 0.50–1.10)
GFR calc Af Amer: >90 mL/min (ref >90)
GFR calc non Af Amer: >90 mL/min (ref >90)
Glucose, Bld: 106 mg/dL — ABNORMAL HIGH (ref 70–99)
Potassium: 3.7 mEq/L (ref 3.5–5.1)
Sodium: 136 mEq/L (ref 135–145)
Total Bilirubin: 0.6 mg/dL (ref 0.3–1.2)
Total Protein: 7.6 g/dL (ref 6.0–8.3)

## 2013-07-23 MED ORDER — HYDROCODONE-ACETAMINOPHEN 5-325 MG PO TABS
2.0000 | ORAL_TABLET | Freq: Once | ORAL | Status: AC
  Administered 2013-07-23: 2 via ORAL
  Filled 2013-07-23: qty 2

## 2013-07-23 MED ORDER — GADOBENATE DIMEGLUMINE 529 MG/ML IV SOLN
20.0000 mL | Freq: Once | INTRAVENOUS | Status: AC
  Administered 2013-07-23: 20 mL via INTRAVENOUS

## 2013-07-23 NOTE — ED Notes (Signed)
Pt reports her PCP

## 2013-07-23 NOTE — Telephone Encounter (Signed)
This message was give to elaine

## 2013-07-23 NOTE — ED Notes (Signed)
Patient transported to MRI 

## 2013-07-23 NOTE — Telephone Encounter (Signed)
Completely exhausted all the time, trembling in hands.  Pt stated she has not been sleeping well and then another time she told home health nurse that she is sleeping to much.  If she stands on her feet for more than 2 minutes she gets dizzy.  Pts husband states that she seems confused.  Pt said her husband sent her a text message and she couldn't remember how to text back.  She can't do OT cause she can't handle both OT and PT so [facility] cancelled OT.  Pt feels like she can't wait till October to see Neurology.  She is having neck pain and headaches and the vicodin doesn't touch it.  Pain scale is 7 out of 10.  Consuella Lose, RN states that pt changes her story a lot and she does not know if pt needs a psych evaluation of if this is something really serious.  Fasting CBG is 90-101 and nurse can't weigh pt cause pt doesn't have a scale but pt states she is not eating a lot

## 2013-07-23 NOTE — ED Notes (Signed)
Pt remains in CT at this time.  

## 2013-07-23 NOTE — Telephone Encounter (Signed)
Pt may want to consider going to ER at [facility] for evaluation.  They can page the neurologist on call.

## 2013-07-23 NOTE — Telephone Encounter (Signed)
Talked with elaine the home health nurse and I gave her this message

## 2013-07-23 NOTE — ED Notes (Signed)
Pt is having increased difficulty caused by MS.  Her PCP told her to come to ED for a neuro consult.  Pt states that over the last month she is becoming incontinent of her bowel and bladder, her vision becoming increasingly blurred and her pain is becoming unbearable.

## 2013-07-23 NOTE — ED Provider Notes (Signed)
CSN: [id number]     Arrival date & time 07/23/13  1821 History     First MD Initiated Contact with Patient 07/23/13 2006     Chief Complaint  Patient presents with  . Multiple Sclerosis   (Consider location/radiation/quality/duration/timing/severity/associated sxs/prior Treatment) HPI Comments: Concerned about MS flare. Worsening of her typical MS symptoms (incontinence, weakness, blurry vision, falls).   Patient is a 37 y.o. female presenting with neurologic complaint. The history is provided by the patient.  Neurologic Problem This is a new problem. The current episode started more than 1 week ago. The problem occurs constantly. The problem has been gradually worsening. Pertinent negatives include no chest pain, no abdominal pain, no headaches and no shortness of breath. Nothing aggravates the symptoms. Nothing relieves the symptoms. She has tried nothing for the symptoms. The treatment provided no relief.    Past Medical History  Diagnosis Date  . Asthma   . Diabetes mellitus   . Hypertension   . PCOS (polycystic ovarian syndrome)   . Gastroparesis   . Polyneuropathy   . Multiple sclerosis     Dr. Anne Hahn   Past Surgical History  Procedure Laterality Date  . Cholecystectomy    . Dilation and curettage of uterus    . Laparoscopic gastric banding  01/26/08  . Cesarean section    . Iud removal  03/19/2012    Procedure: INTRAUTERINE DEVICE (IUD) REMOVAL;  Surgeon: Genia Del, MD;  Location: [facility];  Service: Gynecology;  Laterality: N/A;  . Colon surgery    . Abdominal hysterectomy     Family History  Problem Relation Age of Onset  . Diabetes Mother   . Depression Mother   . Hypertension Mother   . Liver disease Father   . Depression Daughter     bipolar depression  . Coronary artery disease Other   . Cancer Other     ovarian  . Anesthesia problems Neg Hx    History  Substance Use Topics  . Smoking status: Never Smoker   . Smokeless tobacco: Never Used  .  Alcohol Use: No   OB History   Grav Para Term Preterm Abortions TAB SAB Ect Mult Living   1 1 1     0 0 0 1     Review of Systems  Constitutional: Negative for fever and chills.  Respiratory: Negative for cough and shortness of breath.   Cardiovascular: Negative for chest pain.  Gastrointestinal: Negative for abdominal pain.  Neurological: Negative for headaches.  All other systems reviewed and are negative.    Allergies  Review of patient's allergies indicates no known allergies.  Home Medications   Current Outpatient Rx  Name  Route  Sig  Dispense  Refill  . ACCU-CHEK SOFTCLIX LANCETS lancets   Other   1 each by Other route daily. Use as instructed   100 each   3     accu chek nano - Dx 250.00   . glucose blood test strip   Other   1 each by Other route daily.   100 each   3     accu chek nano - Dx 250.00   . HYDROcodone-acetaminophen (NORCO/VICODIN) 5-325 MG per tablet   Oral   Take 1 tablet by mouth every 4 (four) hours as needed for pain.   15 tablet   0   . Interferon Beta-1a (REBIF REBIDOSE )   Subcutaneous   Inject 8.8 mcg into the skin 3 (three) times a week.         [name]  metFORMIN (GLUCOPHAGE) 500 MG tablet   Oral   Take 500 mg by mouth 3 (three) times daily.         . pregabalin (LYRICA) 100 MG capsule   Oral   Take 1 capsule (100 mg total) by mouth 3 (three) times daily.   90 capsule   3    BP 132/82  Pulse 86  Temp(Src) 98.1 F (36.7 C) (Oral)  Resp 16  SpO2 99%  LMP 03/17/2012 Physical Exam  Nursing note and vitals reviewed. Constitutional: She is oriented to person, place, and time. She appears well-developed and well-nourished. No distress.  HENT:  Head: Normocephalic and atraumatic.  Eyes: EOM are normal. Pupils are equal, round, and reactive to light.  Neck: Normal range of motion. Neck supple.  Cardiovascular: Normal rate and regular rhythm.  Exam reveals no friction rub.   No murmur heard. Pulmonary/Chest: Effort  normal and breath sounds normal. No respiratory distress. She has no wheezes. She has no rales.  Abdominal: Soft. She exhibits no distension. There is no tenderness. There is no rebound.  Musculoskeletal: Normal range of motion. She exhibits no edema.  Neurological: She is alert and oriented to person, place, and time. No cranial nerve deficit. She exhibits abnormal muscle tone (weakness of R arm and R leg). Coordination normal.  Skin: She is not diaphoretic.    ED Course   Procedures (including critical care time)  Labs Reviewed  CBC WITH DIFFERENTIAL - Abnormal; Notable for the following:    MCHC 36.8 (*)    All other components within normal limits  COMPREHENSIVE METABOLIC PANEL - Abnormal; Notable for the following:    Glucose, Bld 106 (*)    ALT 55 (*)    All other components within normal limits   Mr [name] Wo Contrast  07/23/2013   *RADIOLOGY REPORT*  Clinical Data:  Multiple sclerosis.  MRI HEAD WITHOUT AND WITH CONTRAST MRI CERVICAL SPINE WITHOUT AND WITH CONTRAST  Technique:  Multiplanar, multiecho pulse sequences of the brain and surrounding structures, and cervical spine, to include the craniocervical junction and cervicothoracic junction, were obtained without and with intravenous contrast.  Contrast: 20mL MULTIHANCE GADOBENATE DIMEGLUMINE 529 MG/ML IV SOLN  Comparison:   Prior MRI from 06/19/2013.  MRI HEAD  Findings: Scattered foci of periventricular T2 / FLAIR signal are seen within the periventricular white matter, not significantly changed as compared to the prior exam. Lesion within the right thalamus is also stable.  These findings are most consistent with an underlying demyelinating process.  No new lesions are identified.  No enhancing lesions are seen on post contrast sequences.  There is no diffusion weighted signal abnormality to suggest acute intracranial hemorrhage or infarct.  IMPRESSION:  Stable appearance of the brain with multiple T2 / FLAIR hyperintense lesions  involving the periventricular white matter and right thalamus, consistent with multiple sclerosis. No new lesions or enhancing lesions identified.  MRI CERVICAL SPINE  Findings: The study is somewhat technically degraded by motion artifact.  The vertebral bodies are normally aligned with preservation of the normal cervical lordosis.  Signal intensity within the vertebral body bone marrow is normal.  The craniocervical junction is within normal limits.  The visualized posterior fossa is normal.  Signal intensity within the cervical spinal cord is within normal limits without definite evidence of demyelinating process. Heterogeneous T2 signal intensity seen on axial T2 sequences likely artifactual due to body habitus and motion, and is not seen on the corresponding sagittal sequences.  No  enhancing lesions identified.  At C2-3, there is no significant canal or neural foraminal stenosis.  At C3-4, there is no significant canal or neural foraminal stenosis.  At C4-5, mild degenerative disc bulges present without significant canal or foraminal stenosis.  At C5-6, degenerative disc disease with degenerative disc osteophyte and mild canal stenosis is again seen, not significantly changed.  At C6-7, there is minimal degenerative disc disease, also stable. No significant canal or foraminal stenosis.  C7-T1, no canal or foraminal stenosis.  IMPRESSION: 1.  Stable appearance of the cervical spine with no definite spinal cord signal abnormality to suggest underlying demyelinating disease. No enhancing lesions.  2.  Stable appearance of degenerative disc disease at C5-6 with mild canal and bilateral foraminal stenosis.   Original Report Authenticated By: Rise Mu, M.D.   Mr Cervical Spine W Wo Contrast  07/23/2013   *RADIOLOGY REPORT*  Clinical Data:  Multiple sclerosis.  MRI HEAD WITHOUT AND WITH CONTRAST MRI CERVICAL SPINE WITHOUT AND WITH CONTRAST  Technique:  Multiplanar, multiecho pulse sequences of the brain  and surrounding structures, and cervical spine, to include the craniocervical junction and cervicothoracic junction, were obtained without and with intravenous contrast.  Contrast: 20mL MULTIHANCE GADOBENATE DIMEGLUMINE 529 MG/ML IV SOLN  Comparison:   Prior MRI from 06/19/2013.  MRI HEAD  Findings: Scattered foci of periventricular T2 / FLAIR signal are seen within the periventricular white matter, not significantly changed as compared to the prior exam. Lesion within the right thalamus is also stable.  These findings are most consistent with an underlying demyelinating process.  No new lesions are identified.  No enhancing lesions are seen on post contrast sequences.  There is no diffusion weighted signal abnormality to suggest acute intracranial hemorrhage or infarct.  IMPRESSION:  Stable appearance of the brain with multiple T2 / FLAIR hyperintense lesions involving the periventricular white matter and right thalamus, consistent with multiple sclerosis. No new lesions or enhancing lesions identified.  MRI CERVICAL SPINE  Findings: The study is somewhat technically degraded by motion artifact.  The vertebral bodies are normally aligned with preservation of the normal cervical lordosis.  Signal intensity within the vertebral body bone marrow is normal.  The craniocervical junction is within normal limits.  The visualized posterior fossa is normal.  Signal intensity within the cervical spinal cord is within normal limits without definite evidence of demyelinating process. Heterogeneous T2 signal intensity seen on axial T2 sequences likely artifactual due to body habitus and motion, and is not seen on the corresponding sagittal sequences.  No enhancing lesions identified.  At C2-3, there is no significant canal or neural foraminal stenosis.  At C3-4, there is no significant canal or neural foraminal stenosis.  At C4-5, mild degenerative disc bulges present without significant canal or foraminal stenosis.  At C5-6,  degenerative disc disease with degenerative disc osteophyte and mild canal stenosis is again seen, not significantly changed.  At C6-7, there is minimal degenerative disc disease, also stable. No significant canal or foraminal stenosis.  C7-T1, no canal or foraminal stenosis.  IMPRESSION: 1.  Stable appearance of the cervical spine with no definite spinal cord signal abnormality to suggest underlying demyelinating disease. No enhancing lesions.  2.  Stable appearance of degenerative disc disease at C5-6 with mild canal and bilateral foraminal stenosis.   Original Report Authenticated By: Rise Mu, M.D.   1. Multiple sclerosis     MDM  37 year old female with history of MS presents with concerns for MS flare. States she's had gradual  worsening of her symptoms over the past month. He symptoms include incontinence of bowel and bladder, blurry vision, pain all over. She also reports more falls of late. She has some right sided weakness secondary to her MS which is on and off. She is currently taking interferon beta. She has followup with neuro and about one month. She's been seen by her PCP for this multiple times. Her last MRI was one month ago and showed no active lesions. Here her vitals are stable and labs are stable. She has mild right arm and right leg weakness on exam. We will MR her head and C-spine with and without contrast look for active lesions. I spoke with Dr. Amada Jupiter of neurology who states with a normal MRI, she was likely stable to go home and followup with her neurologist as soon as possible.  I have reviewed all labs and imaging and considered them in my medical decision making.   Dagmar Hait, MD 07/24/13 [phone]

## 2013-07-29 ENCOUNTER — Telehealth: Payer: Self-pay | Admitting: Neurology

## 2013-07-29 NOTE — Telephone Encounter (Signed)
The patient called about 7:00 this morning. The patient indicates that she has had total body pain from the neck level down. The pain is worsened over the last 24 hours, but this has been a problem for 2 months. Looking back on the records, the patient has not been seen for office since July 2012. The patient did not show for her last revisit. Before we gave him the therapy, the patient needs a revisit. I'll get one set up with our nurse practitioner.

## 2013-07-30 ENCOUNTER — Encounter: Payer: Self-pay | Admitting: Internal Medicine

## 2013-07-31 ENCOUNTER — Telehealth: Payer: Self-pay | Admitting: Internal Medicine

## 2013-07-31 NOTE — Telephone Encounter (Signed)
[name] stated last night pt had burning in spinal and leg. Pt pain  scale of 10. Pt stated vicodin is not working. Pt is having increasing laps of time were she is confused. Pt has missed dosage of routine meds and meals due time lapsing. Pt has appt with dr Anne Hahn in October 2014. Pt refuse to go to er and be treated by neurologist at ER. Cindy please call elaine today

## 2013-07-31 NOTE — Telephone Encounter (Signed)
Pt will call back on Tuesday

## 2013-07-31 NOTE — Telephone Encounter (Signed)
I suggest OV on Tuesday.

## 2013-08-01 ENCOUNTER — Encounter (HOSPITAL_COMMUNITY): Payer: Self-pay | Admitting: *Deleted

## 2013-08-01 ENCOUNTER — Emergency Department (HOSPITAL_COMMUNITY): Payer: No Typology Code available for payment source

## 2013-08-01 ENCOUNTER — Inpatient Hospital Stay (HOSPITAL_COMMUNITY)
Admission: EM | Admit: 2013-08-01 | Discharge: 2013-08-05 | DRG: 060 | Disposition: A | Discharge status: Rehabilitation Facility | Payer: No Typology Code available for payment source | Attending: Internal Medicine | Admitting: Internal Medicine

## 2013-08-01 DIAGNOSIS — R350 Frequency of micturition: Secondary | ICD-10-CM

## 2013-08-01 DIAGNOSIS — R0989 Other specified symptoms and signs involving the circulatory and respiratory systems: Secondary | ICD-10-CM

## 2013-08-01 DIAGNOSIS — G609 Hereditary and idiopathic neuropathy, unspecified: Secondary | ICD-10-CM | POA: Diagnosis present

## 2013-08-01 DIAGNOSIS — R32 Unspecified urinary incontinence: Secondary | ICD-10-CM | POA: Diagnosis present

## 2013-08-01 DIAGNOSIS — E119 Type 2 diabetes mellitus without complications: Secondary | ICD-10-CM | POA: Diagnosis present

## 2013-08-01 DIAGNOSIS — Z01818 Encounter for other preprocedural examination: Secondary | ICD-10-CM

## 2013-08-01 DIAGNOSIS — R209 Unspecified disturbances of skin sensation: Secondary | ICD-10-CM

## 2013-08-01 DIAGNOSIS — J309 Allergic rhinitis, unspecified: Secondary | ICD-10-CM

## 2013-08-01 DIAGNOSIS — R1084 Generalized abdominal pain: Secondary | ICD-10-CM

## 2013-08-01 DIAGNOSIS — T380X5A Adverse effect of glucocorticoids and synthetic analogues, initial encounter: Secondary | ICD-10-CM | POA: Diagnosis present

## 2013-08-01 DIAGNOSIS — R0609 Other forms of dyspnea: Secondary | ICD-10-CM

## 2013-08-01 DIAGNOSIS — R5381 Other malaise: Secondary | ICD-10-CM

## 2013-08-01 DIAGNOSIS — K219 Gastro-esophageal reflux disease without esophagitis: Secondary | ICD-10-CM | POA: Diagnosis present

## 2013-08-01 DIAGNOSIS — J40 Bronchitis, not specified as acute or chronic: Secondary | ICD-10-CM

## 2013-08-01 DIAGNOSIS — J45901 Unspecified asthma with (acute) exacerbation: Secondary | ICD-10-CM

## 2013-08-01 DIAGNOSIS — R159 Full incontinence of feces: Secondary | ICD-10-CM | POA: Diagnosis present

## 2013-08-01 DIAGNOSIS — G43909 Migraine, unspecified, not intractable, without status migrainosus: Secondary | ICD-10-CM

## 2013-08-01 DIAGNOSIS — R5383 Other fatigue: Secondary | ICD-10-CM

## 2013-08-01 DIAGNOSIS — Z79899 Other long term (current) drug therapy: Secondary | ICD-10-CM

## 2013-08-01 DIAGNOSIS — J45909 Unspecified asthma, uncomplicated: Secondary | ICD-10-CM | POA: Diagnosis present

## 2013-08-01 DIAGNOSIS — R630 Anorexia: Secondary | ICD-10-CM

## 2013-08-01 DIAGNOSIS — G35 Multiple sclerosis: Principal | ICD-10-CM | POA: Diagnosis present

## 2013-08-01 DIAGNOSIS — E785 Hyperlipidemia, unspecified: Secondary | ICD-10-CM | POA: Diagnosis present

## 2013-08-01 DIAGNOSIS — H66009 Acute suppurative otitis media without spontaneous rupture of ear drum, unspecified ear: Secondary | ICD-10-CM

## 2013-08-01 DIAGNOSIS — J209 Acute bronchitis, unspecified: Secondary | ICD-10-CM

## 2013-08-01 DIAGNOSIS — Z6839 Body mass index (BMI) 39.0-39.9, adult: Secondary | ICD-10-CM

## 2013-08-01 DIAGNOSIS — Z9884 Bariatric surgery status: Secondary | ICD-10-CM

## 2013-08-01 DIAGNOSIS — R945 Abnormal results of liver function studies: Secondary | ICD-10-CM

## 2013-08-01 DIAGNOSIS — M7710 Lateral epicondylitis, unspecified elbow: Secondary | ICD-10-CM

## 2013-08-01 DIAGNOSIS — I10 Essential (primary) hypertension: Secondary | ICD-10-CM | POA: Diagnosis present

## 2013-08-01 DIAGNOSIS — J329 Chronic sinusitis, unspecified: Secondary | ICD-10-CM

## 2013-08-01 DIAGNOSIS — K7689 Other specified diseases of liver: Secondary | ICD-10-CM

## 2013-08-01 HISTORY — DX: Multiple sclerosis: G35

## 2013-08-01 LAB — CBC
HCT: 39.2 % (ref 36.0–46.0)
Hemoglobin: 14.2 g/dL (ref 12.0–15.0)
MCH: 31.9 pg (ref 26.0–34.0)
MCHC: 36.2 g/dL — ABNORMAL HIGH (ref 30.0–36.0)
MCV: 88.1 fL (ref 78.0–100.0)
Platelets: 316 10*3/uL (ref 150–400)
RBC: 4.45 MIL/uL (ref 3.87–5.11)
RDW: 12.4 % (ref 11.5–15.5)
WBC: 7.6 10*3/uL (ref 4.0–10.5)

## 2013-08-01 LAB — BASIC METABOLIC PANEL
BUN: 8 mg/dL (ref 6–23)
CO2: 26 mEq/L (ref 19–32)
Calcium: 9.2 mg/dL (ref 8.4–10.5)
Chloride: 101 mEq/L (ref 96–112)
Creatinine, Ser: 0.48 mg/dL — ABNORMAL LOW (ref 0.50–1.10)
GFR calc Af Amer: >90 mL/min (ref >90)
GFR calc non Af Amer: >90 mL/min (ref >90)
Glucose, Bld: 109 mg/dL — ABNORMAL HIGH (ref 70–99)
Potassium: 3.7 mEq/L (ref 3.5–5.1)
Sodium: 138 mEq/L (ref 135–145)

## 2013-08-01 LAB — URINALYSIS, ROUTINE W REFLEX MICROSCOPIC
Bilirubin Urine: NEGATIVE
Glucose, UA: NEGATIVE mg/dL
Hgb urine dipstick: NEGATIVE
Ketones, ur: NEGATIVE mg/dL
Leukocytes, UA: NEGATIVE
Nitrite: NEGATIVE
Protein, ur: NEGATIVE mg/dL
Specific Gravity, Urine: 1.024 (ref 1.005–1.030)
Urobilinogen, UA: 1 mg/dL (ref 0.0–1.0)
pH: 6 (ref 5.0–8.0)

## 2013-08-01 MED ORDER — OXYCODONE-ACETAMINOPHEN 5-325 MG PO TABS
1.0000 | ORAL_TABLET | Freq: Once | ORAL | Status: AC
  Administered 2013-08-01: 1 via ORAL
  Filled 2013-08-01: qty 1

## 2013-08-01 MED ORDER — METFORMIN HCL 500 MG PO TABS
500.0000 mg | ORAL_TABLET | Freq: Three times a day (TID) | ORAL | Status: DC
  Administered 2013-08-01: 500 mg via ORAL
  Filled 2013-08-01 (×4): qty 1

## 2013-08-01 MED ORDER — INSULIN ASPART 100 UNIT/ML ~~LOC~~ SOLN
0.0000 [IU] | SUBCUTANEOUS | Status: DC
  Administered 2013-08-01 – 2013-08-03 (×8): 2 [IU] via SUBCUTANEOUS

## 2013-08-01 MED ORDER — ENOXAPARIN SODIUM 60 MG/0.6ML ~~LOC~~ SOLN
50.0000 mg | SUBCUTANEOUS | Status: DC
  Administered 2013-08-01: 60 mg via SUBCUTANEOUS
  Administered 2013-08-02 – 2013-08-04 (×3): 50 mg via SUBCUTANEOUS
  Filled 2013-08-01 (×5): qty 0.6

## 2013-08-01 MED ORDER — SODIUM CHLORIDE 0.9 % IV SOLN: INTRAVENOUS | Status: AC

## 2013-08-01 MED ORDER — HYDROMORPHONE HCL PF 1 MG/ML IJ SOLN
0.5000 mg | INTRAMUSCULAR | Status: DC | PRN
  Administered 2013-08-01 – 2013-08-03 (×5): 1 mg via INTRAVENOUS
  Filled 2013-08-01 (×6): qty 1

## 2013-08-01 MED ORDER — ONDANSETRON HCL 4 MG/2ML IJ SOLN
4.0000 mg | Freq: Four times a day (QID) | INTRAMUSCULAR | Status: DC | PRN
  Administered 2013-08-01 – 2013-08-04 (×5): 4 mg via INTRAVENOUS
  Filled 2013-08-01 (×7): qty 2

## 2013-08-01 MED ORDER — OXYCODONE HCL 5 MG PO TABS: 5.0000 mg | ORAL_TABLET | ORAL | Status: DC | PRN

## 2013-08-01 MED ORDER — SODIUM CHLORIDE 0.9 % IV SOLN
INTRAVENOUS | Status: DC
  Administered 2013-08-01 – 2013-08-04 (×7): via INTRAVENOUS
  Administered 2013-08-04: 100 mL/h via INTRAVENOUS

## 2013-08-01 MED ORDER — ZOLPIDEM TARTRATE 5 MG PO TABS
5.0000 mg | ORAL_TABLET | Freq: Every evening | ORAL | Status: DC | PRN
  Administered 2013-08-03 – 2013-08-04 (×2): 5 mg via ORAL
  Filled 2013-08-01 (×2): qty 1

## 2013-08-01 MED ORDER — ENOXAPARIN SODIUM 40 MG/0.4ML ~~LOC~~ SOLN: 40.0000 mg | SUBCUTANEOUS | Status: DC

## 2013-08-01 MED ORDER — ONDANSETRON HCL 4 MG PO TABS: 4.0000 mg | ORAL_TABLET | Freq: Four times a day (QID) | ORAL | Status: DC | PRN

## 2013-08-01 MED ORDER — SODIUM CHLORIDE 0.9 % IV SOLN
500.0000 mg | Freq: Two times a day (BID) | INTRAVENOUS | Status: DC
  Administered 2013-08-01 – 2013-08-04 (×6): 500 mg via INTRAVENOUS
  Filled 2013-08-01 (×7): qty 4

## 2013-08-01 MED ORDER — SODIUM CHLORIDE 0.9 % IV SOLN
500.0000 mg | Freq: Four times a day (QID) | INTRAVENOUS | Status: DC
  Filled 2013-08-01: qty 4

## 2013-08-01 MED ORDER — ENOXAPARIN SODIUM 100 MG/ML ~~LOC~~ SOLN: 1.0000 mg/kg | Freq: Two times a day (BID) | SUBCUTANEOUS | Status: DC

## 2013-08-01 MED ORDER — ACETAMINOPHEN 650 MG RE SUPP: 650.0000 mg | Freq: Four times a day (QID) | RECTAL | Status: DC | PRN

## 2013-08-01 MED ORDER — PREGABALIN 50 MG PO CAPS
100.0000 mg | ORAL_CAPSULE | Freq: Three times a day (TID) | ORAL | Status: DC
  Administered 2013-08-01: 100 mg via ORAL
  Filled 2013-08-01: qty 2

## 2013-08-01 MED ORDER — ACETAMINOPHEN 325 MG PO TABS
650.0000 mg | ORAL_TABLET | Freq: Four times a day (QID) | ORAL | Status: DC | PRN
  Administered 2013-08-02 – 2013-08-05 (×2): 650 mg via ORAL
  Filled 2013-08-01 (×2): qty 2

## 2013-08-01 MED ORDER — ALUM & MAG HYDROXIDE-SIMETH 200-200-20 MG/5ML PO SUSP: 30.0000 mL | Freq: Four times a day (QID) | ORAL | Status: DC | PRN

## 2013-08-01 MED ORDER — PANTOPRAZOLE SODIUM 40 MG IV SOLR
40.0000 mg | INTRAVENOUS | Status: DC
  Administered 2013-08-01 – 2013-08-03 (×3): 40 mg via INTRAVENOUS
  Filled 2013-08-01 (×4): qty 40

## 2013-08-01 MED ORDER — BACLOFEN 5 MG HALF TABLET
5.0000 mg | ORAL_TABLET | Freq: Three times a day (TID) | ORAL | Status: DC
  Administered 2013-08-01: via ORAL
  Administered 2013-08-02 – 2013-08-05 (×10): 5 mg via ORAL
  Filled 2013-08-01 (×14): qty 1

## 2013-08-01 NOTE — ED Notes (Signed)
Pt in c/o back pain that starts in her neck and radiates down into her legs, pt states she has MS and this location of pain is typical for her flairs, but states the pain is more severe than normal, rates pain 10/10 at this time, states she has been compliant with her medication, states she had vicodin at home but it doesn't help this pain so she doesn't take it, it only makes her sick to her stomach and groggy, pt alert and tearful in triage

## 2013-08-01 NOTE — ED Notes (Signed)
Mort Sawyers, MD (admitting md) at bedside

## 2013-08-01 NOTE — ED Provider Notes (Signed)
CSN: [id number]     Arrival date & time 08/01/13  1542 History   First MD Initiated Contact with Patient 08/01/13 1551     Chief Complaint  Patient presents with  . Back Pain   (Consider location/radiation/quality/duration/timing/severity/associated sxs/prior Treatment) HPI Comments: [name] is a 37 y.o. Female who states that she has had an MS flare for one month. She describes pain in back , and neck with his leg numbness, and weakness legs; all worsening for one month, and especially worse in the last 3 days. She has been prescribed hydrocodone for the pain but when she takes it only makes her groggy, and does not help the pain. She's been seen in the emergency department 5 times in the last 6 weeks with similar complaints. On the last visit 3 days ago, she had MRI of brain and neck done, that showed chronic stable changes consistent with multiple sclerosis. She has tried to see her neurologist, but her appointment is not until October. She has an appointment with her PCP, in 5 days. She is ambulatory to the emergency department, by private vehicle. There no other known modifying factors.  Patient is a 37 y.o. female presenting with back pain. The history is provided by the patient.  Back Pain   Past Medical History  Diagnosis Date  . Asthma   . Diabetes mellitus   . Hypertension   . PCOS (polycystic ovarian syndrome)   . Gastroparesis   . Polyneuropathy   . Multiple sclerosis     Dr. Anne Hahn   Past Surgical History  Procedure Laterality Date  . Cholecystectomy    . Dilation and curettage of uterus    . Laparoscopic gastric banding  01/26/08  . Cesarean section    . Iud removal  03/19/2012    Procedure: INTRAUTERINE DEVICE (IUD) REMOVAL;  Surgeon: Genia Del, MD;  Location: [facility];  Service: Gynecology;  Laterality: N/A;  . Colon surgery    . Abdominal hysterectomy     Family History  Problem Relation Age of Onset  . Diabetes Mother   . Depression Mother   .  Hypertension Mother   . Liver disease Father   . Depression Daughter     bipolar depression  . Coronary artery disease Other   . Cancer Other     ovarian  . Anesthesia problems Neg Hx    History  Substance Use Topics  . Smoking status: Never Smoker   . Smokeless tobacco: Never Used  . Alcohol Use: No   OB History   Grav Para Term Preterm Abortions TAB SAB Ect Mult Living   1 1 1     0 0 0 1     Review of Systems  Musculoskeletal: Positive for back pain.  All other systems reviewed and are negative.    Allergies  Review of patient's allergies indicates no known allergies.  Home Medications   Current Outpatient Rx  Name  Route  Sig  Dispense  Refill  . Interferon Beta-1a (REBIF REBIDOSE [place])   Subcutaneous   Inject 8.8 mcg into the skin 3 (three) times a week.         . metFORMIN (GLUCOPHAGE) 500 MG tablet   Oral   Take 500 mg by mouth 3 (three) times daily.         . pregabalin (LYRICA) 100 MG capsule   Oral   Take 100 mg by mouth 3 (three) times daily.          BP  140/72  Pulse 80  Temp(Src) 97.2 F (36.2 C) (Oral)  Resp 18  Wt 225 lb (102.059 kg)  BMI 39.87 kg/m2  SpO2 96%  LMP 03/17/2012 Physical Exam  Nursing note and vitals reviewed. Constitutional: She is oriented to person, place, and time. She appears well-developed.  Obese  HENT:  Head: Normocephalic and atraumatic.  Eyes: Conjunctivae and EOM are normal. Pupils are equal, round, and reactive to light.  Neck: Normal range of motion and phonation normal. Neck supple.  Cardiovascular: Normal rate, regular rhythm and intact distal pulses.   Pulmonary/Chest: Effort normal and breath sounds normal. She exhibits no tenderness.  Abdominal: Soft. She exhibits no distension. There is no tenderness. There is no guarding.  Musculoskeletal: Normal range of motion.  Diffuse upper cervical tenderness on the spine and the bilateral lumbar tenderness and midline lumbar tenderness to palpation. Normal  range of motion of back and neck, evaluated while sitting on the stretcher.  Neurological: She is alert and oriented to person, place, and time. She has normal strength. She exhibits normal muscle tone.  She walks with a slow gait; can't stand and turn easily.  Skin: Skin is warm and dry.  Psychiatric: Her behavior is normal. Judgment and thought content normal.  Appears depressed    ED Course  Procedures (including critical care time)  16:20- consult neurology. Case was discussed with Dr. Thad Ranger.  Lumbar spine x-ray, ordered to evaluate for bony abnormality is the source for her pain.  16:50- I discussed the case again with Dr. Thad Ranger after she evaluated the patient. 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 I ordered admission, labs, when they return I will contact the hospitalist.  7:28 PM-Consult complete with Dr. Lovell Sheehan. Patient case explained and discussed. She agrees to admit patient for further evaluation and treatment. Call ended at 2005     Labs Review Labs Reviewed  URINALYSIS, ROUTINE W REFLEX MICROSCOPIC - Abnormal; Notable for the following:    APPearance CLOUDY (*)    All other components within normal limits  CBC - Abnormal; Notable for the following:    MCHC 36.2 (*)    All other components within normal limits  BASIC METABOLIC PANEL - Abnormal; Notable for the following:    Glucose, Bld 109 (*)    Creatinine, Ser 0.48 (*)    All other components within normal limits  URINE CULTURE   Imaging Review Dg Lumbar Spine Complete  08/01/2013   *RADIOLOGY REPORT*  Clinical Data: Burning sensation and throughout the spine. Recurrent falls and incontinence for one month.  LUMBAR SPINE - COMPLETE 4+ VIEW  Comparison: MRI lumbar spine 06/20/2009.  Findings: Vertebral body height and alignment are normal.  No pars interarticularis defect is identified.  Intervertebral disc  space height is maintained.  Paraspinous structures demonstrate a lap band which is partially visualized.  Cholecystectomy clips are also noted.  IMPRESSION: Normal appearing lumbar spine.   Original Report Authenticated By: Holley Dexter, M.D.    MDM   1. Multiple sclerosis exacerbation   2. Exacerbation of multiple sclerosis    Multiple sclerosis with neurologic symptoms and progressive symptoms, that of nonspecific nature. No evidence for acute CVA, metabolic instability, or occult infection. She is to be admitted for further observation, treatment, and stabilization   Nursing Notes Reviewed/ Care Coordinated, and agree without changes. Applicable Imaging Reviewed.  Interpretation of Laboratory Data incorporated into ED treatment  Plan: Admit    Flint Melter, MD 08/01/13 2007

## 2013-08-01 NOTE — Consult Note (Signed)
Reason for Consult:MS Referring Physician: Effie Shy   CC: Pain and difficulty walking, B/B incontinence  HPI: [name] is an 37 y.o. female who was diagnosed with MS in 2008.  Is on disease modifying therapy of Rebif (M-W-F).  Reports that for the past 4-6 weeks has had severe pain particularly on the right.  Also reports many falls secondary to weakness.  Has had bowel and bladder incontinence and is having to bladder train herself.  Reports sever pain that her PCP is trying to control with Vicodin but the Vicodin has not helped.  Also reports spasms throughout.  Also reports intermittent blurry vision. Patient seen in the ED 8 times since April, 4 in the month of July and one other in August.  Has had MRI's of the brain, cervical, thoracic and lumbar spines with no active lesions noted.   This patient has seen Dr. Anne Hahn as an outpatient but has not seen him since 2012 and has had no-shows and therefore it has been difficult for her to get a follow up appointment.  She does have one scheduled in the future but felt she could not wait that long.  Past Medical History  Diagnosis Date  . Asthma   . Diabetes mellitus   . Hypertension   . PCOS (polycystic ovarian syndrome)   . Gastroparesis   . Polyneuropathy   . Multiple sclerosis     Dr. Anne Hahn    Past Surgical History  Procedure Laterality Date  . Cholecystectomy    . Dilation and curettage of uterus    . Laparoscopic gastric banding  01/26/08  . Cesarean section    . Iud removal  03/19/2012    Procedure: INTRAUTERINE DEVICE (IUD) REMOVAL;  Surgeon: Genia Del, MD;  Location: [facility];  Service: Gynecology;  Laterality: N/A;  . Colon surgery    . Abdominal hysterectomy      Family History  Problem Relation Age of Onset  . Diabetes Mother   . Depression Mother   . Hypertension Mother   . Liver disease Father   . Depression Daughter     bipolar depression  . Coronary artery disease Other   . Cancer Other     ovarian  .  Anesthesia problems Neg Hx     Social History:  reports that she has never smoked. She has never used smokeless tobacco. She reports that she does not drink alcohol or use illicit drugs.  No Known Allergies  Medications: I have reviewed the patient's current medications. Prior to Admission:  Current outpatient prescriptions:Interferon Beta-1a (REBIF REBIDOSE ), Inject 8.8 mcg into the skin 3 (three) times a week., Disp: , Rfl: ;  metFORMIN (GLUCOPHAGE) 500 MG tablet, Take 500 mg by mouth 3 (three) times daily., Disp: , Rfl: ;  pregabalin (LYRICA) 100 MG capsule, Take 100 mg by mouth 3 (three) times daily., Disp: , Rfl:   ROS: History obtained from the patient  General ROS: negative for - fatigue Psychological ROS: negative for - behavioral disorder, hallucinations, memory difficulties, mood swings or suicidal ideation Ophthalmic ROS: as noted in HPI ENT ROS: negative for - epistaxis, nasal discharge, oral lesions, sore throat, tinnitus or vertigo Allergy and Immunology ROS: negative for - hives or itchy/watery eyes Hematological and Lymphatic ROS: negative for - bleeding problems, bruising or swollen lymph nodes Endocrine ROS: negative for - galactorrhea, hair pattern changes, polydipsia/polyuria or temperature intolerance Respiratory ROS: negative for - cough, hemoptysis, shortness of breath or wheezing Cardiovascular ROS: negative for - chest  pain, dyspnea on exertion, edema or irregular heartbeat Gastrointestinal ROS: as noted in HPI Genito-Urinary ROS: as noted in HPI Musculoskeletal ROS: negative for - joint swelling or muscular weakness Neurological ROS: as noted in HPI Dermatological ROS: negative for rash and skin lesion changes  Physical Examination: Blood pressure 127/89, pulse 89, temperature 98.1 F (36.7 C), temperature source Oral, resp. rate 20, weight 102.059 kg (225 lb), last menstrual period 03/17/2012, SpO2 100.00%.  Neurologic Examination Mental Status: Alert,  oriented, thought content appropriate.  Speech fluent without evidence of aphasia.  Able to follow 3 step commands without difficulty. Cranial Nerves: II: Discs flat bilaterally; Visual fields grossly normal, pupils equal, round, reactive to light and accommodation III,IV, VI: ptosis not present, extra-ocular motions intact bilaterally V,VII: smile symmetric, facial light touch sensation normal bilaterally VIII: hearing normal bilaterally IX,X: gag reflex present XI: bilateral shoulder shrug XII: midline tongue extension Motor: Right : Upper extremity   5/5    Left:     Upper extremity   5/5  Lower extremity   5/5     Lower extremity   5/5 Give-way strength throughout with the patient needing encouragement to give full strength.  Resists less on the left Tone and bulk:normal tone throughout; no atrophy noted Sensory: Pinprick and light touch intact throughout, bilaterally Deep Tendon Reflexes: 2+ and symmetric throughout Plantars: Right: downgoing   Left: downgoing Cerebellar: normal finger-to-nose and normal heel-to-shin test Gait: unable to test CV: pulses palpable throughout     Laboratory Studies:   Basic Metabolic Panel: No results found for this basename: NA, K, CL, CO2, GLUCOSE, BUN, CREATININE, CALCIUM, MG, PHOS,  in the last 168 hours  Liver Function Tests: No results found for this basename: AST, ALT, ALKPHOS, BILITOT, PROT, ALBUMIN,  in the last 168 hours No results found for this basename: LIPASE, AMYLASE,  in the last 168 hours No results found for this basename: AMMONIA,  in the last 168 hours  CBC: No results found for this basename: WBC, NEUTROABS, HGB, HCT, MCV, PLT,  in the last 168 hours  Cardiac Enzymes: No results found for this basename: CKTOTAL, CKMB, CKMBINDEX, TROPONINI,  in the last 168 hours  BNP: No components found with this basename: POCBNP,   CBG: No results found for this basename: GLUCAP,  in the last 168 hours  Microbiology: Results for  orders placed during the hospital encounter of 03/17/12  SURGICAL PCR SCREEN     Status: None   Collection Time    03/17/12  2:04 PM      Result Value Range Status   MRSA, PCR NEGATIVE  NEGATIVE Final   Staphylococcus aureus NEGATIVE  NEGATIVE Final   Comment:            The Xpert SA Assay (FDA     approved for NASAL specimens     only), is one component of     a comprehensive surveillance     program.  It is not intended     to diagnose infection nor to     guide or monitor treatment.    Coagulation Studies: No results found for this basename: LABPROT, INR,  in the last 72 hours  Urinalysis: No results found for this basename: COLORURINE, APPERANCEUR, LABSPEC, PHURINE, GLUCOSEU, HGBUR, BILIRUBINUR, KETONESUR, PROTEINUR, UROBILINOGEN, NITRITE, LEUKOCYTESUR,  in the last 168 hours  Lipid Panel:     Component Value Date/Time   CHOL 265* 12/31/2007 1109   TRIG 132 12/31/2007 1109   HDL 36.7* 12/31/2007  1109   CHOLHDL 7.2 CALC 12/31/2007 1109   VLDL 26 12/31/2007 1109    HgbA1C:  Lab Results  Component Value Date   HGBA1C 6.1 07/10/2013    Urine Drug Screen:   No results found for this basename: labopia,  cocainscrnur,  labbenz,  amphetmu,  thcu,  labbarb    Alcohol Level: No results found for this basename: ETH,  in the last 168 hours  Imaging: No results found.   Assessment/Plan: 37 year old female with a history of MS.  Seems to have been decompensating despite the fact that no active lesions can be seen on imaging.  In an attempt to determine if she can be helped at all she will likely benefit from a trial of high dose steroids.  Something specifically for spasticity may help as well.    Recommendations: 1.  Patient to continue Rebif.  Next dose on Monday 2.  Solumedrol 500mg  IV q12 hours 3.  Protonix 4.  UA 5.  D/C vicodin and Lyrica.  Refrain from narcotic use for pain. 6.  Start Baclofen 5mg  TID 6.  PT/OT   Thana Farr, MD [facility] [phone] 08/01/2013, 5:06 PM

## 2013-08-01 NOTE — H&P (Signed)
[facility] History and Physical  [name] ION:[id number] DOB: 02/03/1976 DOA: 08/01/2013  Referring physician: EDP PCP: Gabriela Lemons, DO  Specialists:  Dr. Thana Farr Neurology  Chief Complaint:  Numbness and Weakness in neck arms and Back  HPI: [name] is a 37 y.o. female  With a history of Multiple Sclerosis  Diagnosed in 2010 on Rebif therapy who presents to the ED with complaints of worsening of weakness and numbness in her neck arms and back for over 1 month.   She reports having burning pain and heaviness as well a numbness in her arms and legs.   She was placed on a oral prednisone taper 2 weeks ago but  Did not improve.  Her PCP was in the process of referring her to her Neurologist Dr. Anne Hahn for an evaluation.   She came to the ED due to worsening and no relief with her Vicodin therapy.   She was seen in the ED by Dr. Thana Farr Neurologist.      Review of Systems: The patient denies anorexia, fever, chills, headaches, weight loss,, vision loss, diplopia, dizziness, decreased hearing, rhinitis, hoarseness, chest pain, syncope, dyspnea on exertion, peripheral edema, balance deficits, cough, hemoptysis, abdominal pain, nausea, vomiting, diarrhea, constipation, hematemesis, melena, hematochezia, severe indigestion/heartburn, dysuria, hematuria, incontinence, muscle weakness, suspicious skin lesions, transient blindness, difficulty walking, depression, unusual weight change, abnormal bleeding, enlarged lymph nodes, angioedema, and breast masses.    Past Medical History  Diagnosis Date  . Asthma   . Diabetes mellitus   . Hypertension   . PCOS (polycystic ovarian syndrome)   . Gastroparesis   . Polyneuropathy   . Multiple sclerosis     Dr. Anne Hahn  . Multiple sclerosis, primary progressive 2010    Past Surgical History  Procedure Laterality Date  . Cholecystectomy    . Dilation and curettage of uterus    . Laparoscopic gastric banding  01/26/08  . Cesarean  section    . Iud removal  03/19/2012    Procedure: INTRAUTERINE DEVICE (IUD) REMOVAL;  Surgeon: Genia Del, MD;  Location: [facility];  Service: Gynecology;  Laterality: N/A;  . Colon surgery    . Abdominal hysterectomy      Prior to Admission medications   Medication Sig Start Date End Date Taking? Authorizing Provider  Interferon Beta-1a (REBIF REBIDOSE [place]) Inject 8.8 mcg into the skin 3 (three) times a week.   Yes Historical Provider, MD  metFORMIN (GLUCOPHAGE) 500 MG tablet Take 500 mg by mouth 3 (three) times daily.   Yes Historical Provider, MD  pregabalin (LYRICA) 100 MG capsule Take 100 mg by mouth 3 (three) times daily.   Yes Historical Provider, MD    No Known Allergies   Social History:  Married  reports that she has never smoked. She has never used smokeless tobacco. She reports that she does not drink alcohol or use illicit drugs.     Family History  Problem Relation Age of Onset  . Diabetes Mother   . Depression Mother   . Hypertension Mother   . Liver disease Father   . Depression Daughter     bipolar depression  . Coronary artery disease Other   . Cancer Other     ovarian  . Anesthesia problems Neg Hx        Physical Exam:  GEN:  Pleasant  Morbidly Obese  37 y.o.  female  examined  and in no acute distress; cooperative with exam Filed Vitals:   08/01/13 1548 08/01/13  1549 08/01/13 1858 08/01/13 2100  BP: 127/89  140/72 151/109  Pulse: 89  80 77  Temp: 98.1 F (36.7 C)  97.2 F (36.2 C) 98.1 F (36.7 C)  TempSrc: Oral  Oral Oral  Resp: 20  18 18   Height:  5\' 3"  (1.6 m)  5' 3.6" (1.615 m)  Weight: 102.059 kg (225 lb) 102.059 kg (225 lb)  102.2 kg (225 lb 5 oz)  SpO2: 100%  96% 98%   Blood pressure 151/109, pulse 77, temperature 98.1 F (36.7 C), temperature source Oral, resp. rate 18, height 5' 3.6" (1.615 m), weight 102.2 kg (225 lb 5 oz), last menstrual period 03/17/2012, SpO2 98.00%. PSYCH: She is alert and oriented x4; does not appear anxious  does not appear depressed; affect is normal HEENT: Normocephalic and Atraumatic, Mucous membranes pink; PERRLA; EOM intact; Fundi:  Benign;  No scleral icterus, Nares: Patent, Oropharynx: Clear, Fair Dentition, Neck:  FROM, no cervical lymphadenopathy nor thyromegaly or carotid bruit; no JVD; Breasts:: Not examined CHEST WALL: No tenderness CHEST: Normal respiration, clear to auscultation bilaterally HEART: Regular rate and rhythm; no murmurs rubs or gallops BACK: No kyphosis or scoliosis; no CVA tenderness ABDOMEN: Positive Bowel Sounds, Obese, soft non-tender; no masses, no organomegaly, no pannus; no intertriginous candida. Rectal Exam: Not done EXTREMITIES: No cyanosis, clubbing or edema; no ulcerations. Genitalia: not examined PULSES: 2+ and symmetric SKIN: Normal hydration no rash or ulceration CNS: Cranial nerves 2-12 grossly intact  + weakness 4/5 in Both Upper Extremities, and both lower Extremities.       Labs on Admission:  Basic Metabolic Panel:  Recent Labs Lab 08/01/13 1806  NA 138  K 3.7  CL 101  CO2 26  GLUCOSE 109*  BUN 8  CREATININE 0.48*  CALCIUM 9.2   Liver Function Tests: No results found for this basename: AST, ALT, ALKPHOS, BILITOT, PROT, ALBUMIN,  in the last 168 hours No results found for this basename: LIPASE, AMYLASE,  in the last 168 hours No results found for this basename: AMMONIA,  in the last 168 hours CBC:  Recent Labs Lab 08/01/13 1806  WBC 7.6  HGB 14.2  HCT 39.2  MCV 88.1  PLT 316   Cardiac Enzymes: No results found for this basename: CKTOTAL, CKMB, CKMBINDEX, TROPONINI,  in the last 168 hours  BNP (last 3 results) No results found for this basename: PROBNP,  in the last 8760 hours CBG: No results found for this basename: GLUCAP,  in the last 168 hours  Radiological Exams on Admission: Dg Lumbar Spine Complete  08/01/2013   *RADIOLOGY REPORT*  Clinical Data: Burning sensation and throughout the spine. Recurrent falls and  incontinence for one month.  LUMBAR SPINE - COMPLETE 4+ VIEW  Comparison: MRI lumbar spine 06/20/2009.  Findings: Vertebral body height and alignment are normal.  No pars interarticularis defect is identified.  Intervertebral disc space height is maintained.  Paraspinous structures demonstrate a lap band which is partially visualized.  Cholecystectomy clips are also noted.  IMPRESSION: Normal appearing lumbar spine.   Original Report Authenticated By: Holley Dexter, M.D.     Assessment/Plan Principal Problem:   Exacerbation of multiple sclerosis Active Problems:   DIABETES MELLITUS, TYPE II   HYPERLIPIDEMIA   OBESITY, MORBID   HYPERTENSION   ASTHMA   GERD   1.   Multiple Sclerosis Exacerbation-   High Dose IV Steroids, Continue Rebif, and neuro checks.  Neurology Following.    2.   DM2-  Continue Metformin and Add  SSI coverage PRN, Check HbA1C in AM.     3.   HTN- Monitor BPs, IV hydralazine PRN.    4.   Hyperlipidemia-   Not currently on RX, due to increased Liver enzymes.   Check Fasting Lipids in AM.    5.   Asthma-  Stable , Albuterol PRN.    6.   Morbid Obesity-  Needs Dietary Changes,  Follow up with PCP.     7.   DVT prophylaxis with Lovenox       Code Status:     FULL CODE Family Communication:    No Family Present Disposition Plan:     Inpatient  Time spent:  92 Minutes  Ron Parker [facility] Pager [phone]  If 7PM-7AM, please contact night-coverage [email or web address] Password [email or web address] 08/01/2013, 10:15 PM

## 2013-08-01 NOTE — ED Notes (Signed)
Pt returned from xray

## 2013-08-02 ENCOUNTER — Encounter (HOSPITAL_COMMUNITY): Payer: Self-pay | Admitting: *Deleted

## 2013-08-02 DIAGNOSIS — K219 Gastro-esophageal reflux disease without esophagitis: Secondary | ICD-10-CM

## 2013-08-02 DIAGNOSIS — R209 Unspecified disturbances of skin sensation: Secondary | ICD-10-CM

## 2013-08-02 LAB — BASIC METABOLIC PANEL
BUN: 7 mg/dL (ref 6–23)
CO2: 22 mEq/L (ref 19–32)
Calcium: 9 mg/dL (ref 8.4–10.5)
Chloride: 102 mEq/L (ref 96–112)
Creatinine, Ser: 0.51 mg/dL (ref 0.50–1.10)
GFR calc Af Amer: >90 mL/min (ref >90)
GFR calc non Af Amer: >90 mL/min (ref >90)
Glucose, Bld: 188 mg/dL — ABNORMAL HIGH (ref 70–99)
Potassium: 3.5 mEq/L (ref 3.5–5.1)
Sodium: 136 mEq/L (ref 135–145)

## 2013-08-02 LAB — URINE CULTURE: Colony Count: 10000

## 2013-08-02 LAB — CBC
HCT: 39.4 % (ref 36.0–46.0)
Hemoglobin: 14 g/dL (ref 12.0–15.0)
MCH: 31.5 pg (ref 26.0–34.0)
MCHC: 35.5 g/dL (ref 30.0–36.0)
MCV: 88.5 fL (ref 78.0–100.0)
Platelets: 297 10*3/uL (ref 150–400)
RBC: 4.45 MIL/uL (ref 3.87–5.11)
RDW: 12.4 % (ref 11.5–15.5)
WBC: 5.6 10*3/uL (ref 4.0–10.5)

## 2013-08-02 LAB — HEMOGLOBIN A1C
Hgb A1c MFr Bld: 6.2 % — ABNORMAL HIGH (ref <5.7)
Mean Plasma Glucose: 131 mg/dL — ABNORMAL HIGH (ref <117)

## 2013-08-02 LAB — GLUCOSE, CAPILLARY
Glucose-Capillary: 113 mg/dL — ABNORMAL HIGH (ref 70–99)
Glucose-Capillary: 183 mg/dL — ABNORMAL HIGH (ref 70–99)
Glucose-Capillary: 170 mg/dL — ABNORMAL HIGH (ref 70–99)
Glucose-Capillary: 173 mg/dL — ABNORMAL HIGH (ref 70–99)
Glucose-Capillary: 176 mg/dL — ABNORMAL HIGH (ref 70–99)

## 2013-08-02 MED ORDER — METFORMIN HCL 500 MG PO TABS
500.0000 mg | ORAL_TABLET | Freq: Three times a day (TID) | ORAL | Status: DC
  Administered 2013-08-02 – 2013-08-05 (×10): 500 mg via ORAL
  Filled 2013-08-02 (×12): qty 1

## 2013-08-02 MED ORDER — GABAPENTIN 300 MG PO CAPS
300.0000 mg | ORAL_CAPSULE | Freq: Two times a day (BID) | ORAL | Status: DC
  Administered 2013-08-02 – 2013-08-05 (×7): 300 mg via ORAL
  Filled 2013-08-02 (×8): qty 1

## 2013-08-02 MED ORDER — TRAMADOL HCL 50 MG PO TABS
50.0000 mg | ORAL_TABLET | Freq: Four times a day (QID) | ORAL | Status: DC | PRN
  Administered 2013-08-03 – 2013-08-04 (×4): 50 mg via ORAL
  Filled 2013-08-02 (×4): qty 1

## 2013-08-02 MED ORDER — DIPHENHYDRAMINE HCL 25 MG PO CAPS
25.0000 mg | ORAL_CAPSULE | Freq: Four times a day (QID) | ORAL | Status: DC | PRN
  Administered 2013-08-02: 25 mg via ORAL
  Filled 2013-08-02: qty 1

## 2013-08-02 NOTE — Progress Notes (Signed)
[name] [id number] Admitted to  [id number] *: 08/02/2013 2:29 AM Attending Provider: Ron Parker, MD    [name] is a 37 y.o. female patient admitted from ED awake, alert  & orientated  X 3,  Full Code, VSS - Blood pressure 110/77, pulse 75, temperature 98.1 F (36.7 C), temperature source Oral, resp. rate 16, height 5' 3.6" (1.615 m), weight 102.2 kg (225 lb 5 oz), last menstrual period 03/17/2012, SpO2 94.00%., O2    Room air, no c/o shortness of breath, no c/o chest pain, no distress noted. .   IV site WDL:  antecubital right, condition patent and no redness with a transparent dsg that's clean dry and intact.  Allergies:  No Known Allergies   Past Medical History  Diagnosis Date  . Asthma   . Diabetes mellitus   . Hypertension   . PCOS (polycystic ovarian syndrome)   . Gastroparesis   . Polyneuropathy   . Multiple sclerosis     Dr. Anne Hahn  . Multiple sclerosis, primary progressive 2010    History:  obtained from patient  Pt orientation to unit, room and routine. Information packet given to patient/family and safety video watched.  Admission INP armband ID verified with patient/family, and in place. SR up x 2, fall risk assessment complete with Patient and family verbalizing understanding of risks associated with falls. Pt verbalizes an understanding of how to use the call bell and to call for help before getting out of bed.  Skin, clean-dry- intact without evidence of bruising, or skin tears.   No evidence of skin break down noted on exam.    Will cont to monitor and assist as needed.  Tessie Eke, RN 08/02/2013 2:29 AM

## 2013-08-02 NOTE — Progress Notes (Signed)
Patient ID: [name]  female  ZOX:[id number]    DOB: 1976-10-19    DOA: 08/01/2013  PCP: Thomos Lemons, DO  Assessment/Plan: Principal Problem:   Exacerbation of multiple sclerosis: Slight improvement after one dose of Solu-Medrol, - Appreciate neurology recommendations, continue steroids - Patient's husband will bring Rebif, she is on Monday Wednesday Friday, will need in a.m. - PT, OT, pain control  Active Problems:   DIABETES MELLITUS, TYPE II - Continue sliding scale insulin - Hemoglobin A1c 6.2    HYPERLIPIDEMIA - Lipid panel pending    OBESITY, MORBID - Counseled patient on diet and weight control    HYPERTENSION: Well controlled except for one reading high    ASTHMA: Stable    GERD: Continue PPI  DVT Prophylaxis:  Code Status:  Disposition: Not medically ready    Subjective: Feeling better after starting steroids  Objective: Weight change:   Intake/Output Summary (Last 24 hours) at 08/02/13 1319 Last data filed at 08/02/13 0551  Gross per 24 hour  Intake 927.33 ml  Output   1250 ml  Net -322.67 ml   Blood pressure 143/92, pulse 106, temperature 97.7 F (36.5 C), temperature source Oral, resp. rate 20, height 5' 3.6" (1.615 m), weight 102.2 kg (225 lb 5 oz), last menstrual period 03/17/2012, SpO2 91.00%.  Physical Exam: General: Alert and awake, oriented x3, not in any acute distress. CVS: S1-S2 clear, no murmur rubs or gallops Chest: clear to auscultation bilaterally, no wheezing, rales or rhonchi Abdomen: Obese, soft nontender, nondistended, normal bowel sounds  Extremities: no cyanosis, clubbing or edema noted bilaterally Neuro: Cranial nerves II-XII intact, no focal neurological deficits  Lab Results: Basic Metabolic Panel:  Recent Labs Lab 08/01/13 1806 08/02/13 0516  NA 138 136  K 3.7 3.5  CL 101 102  CO2 26 22  GLUCOSE 109* 188*  BUN 8 7  CREATININE 0.48* 0.51  CALCIUM 9.2 9.0   Liver Function Tests: No results found for this  basename: AST, ALT, ALKPHOS, BILITOT, PROT, ALBUMIN,  in the last 168 hours No results found for this basename: LIPASE, AMYLASE,  in the last 168 hours No results found for this basename: AMMONIA,  in the last 168 hours CBC:  Recent Labs Lab 08/01/13 1806 08/02/13 0516  WBC 7.6 5.6  HGB 14.2 14.0  HCT 39.2 39.4  MCV 88.1 88.5  PLT 316 297   Cardiac Enzymes: No results found for this basename: CKTOTAL, CKMB, CKMBINDEX, TROPONINI,  in the last 168 hours BNP: No components found with this basename: POCBNP,  CBG:  Recent Labs Lab 08/01/13 2306 08/02/13 0815  GLUCAP 113* 183*     Micro Results: No results found for this or any previous visit (from the past 240 hour(s)).  Studies/Results: Dg Lumbar Spine Complete  08/01/2013   *RADIOLOGY REPORT*  Clinical Data: Burning sensation and throughout the spine. Recurrent falls and incontinence for one month.  LUMBAR SPINE - COMPLETE 4+ VIEW  Comparison: MRI lumbar spine 06/20/2009.  Findings: Vertebral body height and alignment are normal.  No pars interarticularis defect is identified.  Intervertebral disc space height is maintained.  Paraspinous structures demonstrate a lap band which is partially visualized.  Cholecystectomy clips are also noted.  IMPRESSION: Normal appearing lumbar spine.   Original Report Authenticated By: Holley Dexter, M.D.   Mr [name] WJ Contrast  07/23/2013   *RADIOLOGY REPORT*  Clinical Data:  Multiple sclerosis.  MRI HEAD WITHOUT AND WITH CONTRAST MRI CERVICAL SPINE WITHOUT AND WITH CONTRAST  Technique:  Multiplanar, multiecho pulse sequences of the brain and surrounding structures, and cervical spine, to include the craniocervical junction and cervicothoracic junction, were obtained without and with intravenous contrast.  Contrast: 20mL MULTIHANCE GADOBENATE DIMEGLUMINE 529 MG/ML IV SOLN  Comparison:   Prior MRI from 06/19/2013.  MRI HEAD  Findings: Scattered foci of periventricular T2 / FLAIR signal are seen  within the periventricular white matter, not significantly changed as compared to the prior exam. Lesion within the right thalamus is also stable.  These findings are most consistent with an underlying demyelinating process.  No new lesions are identified.  No enhancing lesions are seen on post contrast sequences.  There is no diffusion weighted signal abnormality to suggest acute intracranial hemorrhage or infarct.  IMPRESSION:  Stable appearance of the brain with multiple T2 / FLAIR hyperintense lesions involving the periventricular white matter and right thalamus, consistent with multiple sclerosis. No new lesions or enhancing lesions identified.  MRI CERVICAL SPINE  Findings: The study is somewhat technically degraded by motion artifact.  The vertebral bodies are normally aligned with preservation of the normal cervical lordosis.  Signal intensity within the vertebral body bone marrow is normal.  The craniocervical junction is within normal limits.  The visualized posterior fossa is normal.  Signal intensity within the cervical spinal cord is within normal limits without definite evidence of demyelinating process. Heterogeneous T2 signal intensity seen on axial T2 sequences likely artifactual due to body habitus and motion, and is not seen on the corresponding sagittal sequences.  No enhancing lesions identified.  At C2-3, there is no significant canal or neural foraminal stenosis.  At C3-4, there is no significant canal or neural foraminal stenosis.  At C4-5, mild degenerative disc bulges present without significant canal or foraminal stenosis.  At C5-6, degenerative disc disease with degenerative disc osteophyte and mild canal stenosis is again seen, not significantly changed.  At C6-7, there is minimal degenerative disc disease, also stable. No significant canal or foraminal stenosis.  C7-T1, no canal or foraminal stenosis.  IMPRESSION: 1.  Stable appearance of the cervical spine with no definite spinal cord  signal abnormality to suggest underlying demyelinating disease. No enhancing lesions.  2.  Stable appearance of degenerative disc disease at C5-6 with mild canal and bilateral foraminal stenosis.   Original Report Authenticated By: Rise Mu, M.D.   Mr Cervical Spine W Wo Contrast  07/23/2013   *RADIOLOGY REPORT*  Clinical Data:  Multiple sclerosis.  MRI HEAD WITHOUT AND WITH CONTRAST MRI CERVICAL SPINE WITHOUT AND WITH CONTRAST  Technique:  Multiplanar, multiecho pulse sequences of the brain and surrounding structures, and cervical spine, to include the craniocervical junction and cervicothoracic junction, were obtained without and with intravenous contrast.  Contrast: 20mL MULTIHANCE GADOBENATE DIMEGLUMINE 529 MG/ML IV SOLN  Comparison:   Prior MRI from 06/19/2013.  MRI HEAD  Findings: Scattered foci of periventricular T2 / FLAIR signal are seen within the periventricular white matter, not significantly changed as compared to the prior exam. Lesion within the right thalamus is also stable.  These findings are most consistent with an underlying demyelinating process.  No new lesions are identified.  No enhancing lesions are seen on post contrast sequences.  There is no diffusion weighted signal abnormality to suggest acute intracranial hemorrhage or infarct.  IMPRESSION:  Stable appearance of the brain with multiple T2 / FLAIR hyperintense lesions involving the periventricular white matter and right thalamus, consistent with multiple sclerosis. No new lesions or enhancing lesions identified.  MRI CERVICAL SPINE  Findings: The study is somewhat technically degraded by motion artifact.  The vertebral bodies are normally aligned with preservation of the normal cervical lordosis.  Signal intensity within the vertebral body bone marrow is normal.  The craniocervical junction is within normal limits.  The visualized posterior fossa is normal.  Signal intensity within the cervical spinal cord is within normal  limits without definite evidence of demyelinating process. Heterogeneous T2 signal intensity seen on axial T2 sequences likely artifactual due to body habitus and motion, and is not seen on the corresponding sagittal sequences.  No enhancing lesions identified.  At C2-3, there is no significant canal or neural foraminal stenosis.  At C3-4, there is no significant canal or neural foraminal stenosis.  At C4-5, mild degenerative disc bulges present without significant canal or foraminal stenosis.  At C5-6, degenerative disc disease with degenerative disc osteophyte and mild canal stenosis is again seen, not significantly changed.  At C6-7, there is minimal degenerative disc disease, also stable. No significant canal or foraminal stenosis.  C7-T1, no canal or foraminal stenosis.  IMPRESSION: 1.  Stable appearance of the cervical spine with no definite spinal cord signal abnormality to suggest underlying demyelinating disease. No enhancing lesions.  2.  Stable appearance of degenerative disc disease at C5-6 with mild canal and bilateral foraminal stenosis.   Original Report Authenticated By: Rise Mu, M.D.    Medications: Scheduled Meds: . sodium chloride   Intravenous STAT  . baclofen  5 mg Oral TID  . enoxaparin (LOVENOX) injection  50 mg Subcutaneous Q24H  . gabapentin  300 mg Oral BID  . insulin aspart  0-9 Units Subcutaneous Q4H  . metFORMIN  500 mg Oral TID WC  . methylPREDNISolone (SOLU-MEDROL) injection  500 mg Intravenous Q12H  . pantoprazole (PROTONIX) IV  40 mg Intravenous Q24H      LOS: 1 day   RAI,RIPUDEEP M.D. [facility] 08/02/2013, 1:19 PM Pager: [phone]  If 7PM-7AM, please contact night-coverage [email or web address] Password [email or web address]

## 2013-08-02 NOTE — Evaluation (Signed)
Physical Therapy Evaluation Patient Details Name: [name] MRN: [id number] DOB: Sep 14, 1976 Today's Date: 08/02/2013 Time: 4098-1191 PT Time Calculation (min): 27 min  PT Assessment / Plan / Recommendation History of Present Illness  Patient is a 37 yo female admitted with worsening of weakness/numbness of neck, arms, and back.  Patient with MS exacerbation - initiated Solumedrol.  Clinical Impression  Patient presents with problems listed below.  Will benefit from acute PT to maximize independence prior to discharge.  Patient is young MS patient who is having difficulty with ADL's, mobility, energy conservation, bowel and bladder incontinence.  Recommend Inpatient Rehab consult to provide multidisciplinary program to address these issues/education.    PT Assessment  Patient needs continued PT services    Follow Up Recommendations  CIR    Does the patient have the potential to tolerate intense rehabilitation      Barriers to Discharge Decreased caregiver support;Inaccessible home environment Home is 2 story with bed/bath upstairs.  Husband "carries" patient upstairs, or has her sleep on couch on first floor and use BSC.  Husband works long hours, so patient at home alone during day.    Equipment Recommendations  None recommended by PT    Recommendations for Other Services Rehab consult   Frequency Min 4X/week    Precautions / Restrictions Precautions Precautions: Fall Precaution Comments: LE's "give way" per patient. Restrictions Weight Bearing Restrictions: No   Pertinent Vitals/Pain       Mobility  Bed Mobility Bed Mobility: Supine to Sit;Sitting - Scoot to Edge of Bed;Sit to Supine Supine to Sit: 4: Min assist;HOB elevated;With rails Sitting - Scoot to Edge of Bed: 4: Min assist;With rail Sit to Supine: 4: Min assist;HOB elevated Details for Bed Mobility Assistance: Verbal cues for technique.  Transfers Transfers: Sit to Stand;Stand to Sit Sit to Stand: 4: Min  assist;With upper extremity assist;From bed Stand to Sit: 4: Min assist;With upper extremity assist;To bed Details for Transfer Assistance: Verbal cues for hand placement.  Assist to rise to standing. Ambulation/Gait Ambulation/Gait Assistance: 4: Min assist Ambulation Distance (Feet): 6 Feet Assistive device: Rolling walker Ambulation/Gait Assistance Details: Verbal cues for safe use of RW.  Patient able to ambulate short distance and LE's fatigue and patient becomes dizzy.  Returned to sitting. Gait Pattern: Step-through pattern;Decreased stride length;Shuffle;Trunk flexed    Exercises     PT Diagnosis: Difficulty walking;Abnormality of gait;Generalized weakness  PT Problem List: Decreased strength;Decreased activity tolerance;Decreased balance;Decreased mobility;Decreased coordination;Decreased knowledge of use of DME;Impaired sensation;Obesity;Pain PT Treatment Interventions: DME instruction;Gait training;Stair training;Functional mobility training;Therapeutic exercise;Balance training;Patient/family education     PT Goals(Current goals can be found in the care plan section) Acute Rehab PT Goals Patient Stated Goal: To be able to wash my own hair, get up the stairs. PT Goal Formulation: With patient Time For Goal Achievement: 08/16/13 Potential to Achieve Goals: Good  Visit Information  Last PT Received On: 08/02/13 Assistance Needed: +1 History of Present Illness: Patient is a 37 yo female admitted with worsening of weakness/numbness of neck, arms, and back.  Patient with MS exacerbation - initiated Solumedrol.       Prior Functioning  Home Living Family/patient expects to be discharged to:: Inpatient rehab Living Arrangements: Spouse/significant other Available Help at Discharge: Family;Available PRN/intermittently (Husband works long hours.  Patient at home alone) Type of Home: House (Duplex) Home Access: Stairs to enter Home Layout: Two level;Bed/bath upstairs Alternate  Level Stairs-Number of Steps: Flight Alternate Level Stairs-Rails: Right Home Equipment: Walker - 2 wheels;Wheelchair - [employer]  commode Prior Function Level of Independence: Needs assistance Gait / Transfers Assistance Needed: Uses RW for short distances within room.  Husband propels her in manual w/c for longer distances. ADL's / Homemaking Assistance Needed: Needs assist with all ADL's (weakness and decreased endurance).  Assist with meals and housekeeping Comments: Patient is also incontinent of bowel and bladder - not on set program. Communication Communication: No difficulties    Cognition  Cognition Arousal/Alertness: Awake/alert Behavior During Therapy: WFL for tasks assessed/performed Overall Cognitive Status: Within Functional Limits for tasks assessed    Extremity/Trunk Assessment Upper Extremity Assessment Upper Extremity Assessment: Generalized weakness;RUE deficits/detail RUE Deficits / Details: Strength 3/5.  Fatigues quickly. RUE Coordination: decreased fine motor Lower Extremity Assessment Lower Extremity Assessment: RLE deficits/detail RLE Deficits / Details: Strength 4-/5 and fatigues quickly. RLE Sensation: history of peripheral neuropathy RLE Coordination: decreased gross motor   Balance Balance Balance Assessed: Yes Static Sitting Balance Static Sitting - Balance Support: Left upper extremity supported;Feet supported Static Sitting - Level of Assistance: 5: Stand by assistance Static Sitting - Comment/# of Minutes: 3 Dynamic Sitting Balance Dynamic Sitting - Balance Support: Left upper extremity supported;Feet supported Dynamic Sitting - Level of Assistance: 4: Min assist Dynamic Sitting - Balance Activities: Lateral lean/weight shifting;Forward lean/weight shifting;Reaching for objects;Reaching across midline Dynamic Sitting - Comments: Patient with decrease in balance with weight shift, requiring assist for balance.  End of Session PT - End of  Session Equipment Utilized During Treatment: Gait belt Activity Tolerance: Patient limited by fatigue Patient left: in bed;with call bell/phone within reach;with family/visitor present Nurse Communication: Mobility status  GP     Vena Austria 08/02/2013, 3:12 PM Durenda Hurt. Renaldo Fiddler, [facility] Acute Rehab Services Pager [phone]

## 2013-08-02 NOTE — Progress Notes (Signed)
Subjective: Patient awake and alert, sitting up in bed.  Reports that her pain has been an issue all night.  Feels that the Baclofen is helping with her spasticity.  Also reports tinging in her legs bilaterally as if ants are biting her legs.  Has started her Solumedrol and tolerating well.  Objective: Current vital signs: BP 117/81  Pulse 96  Temp(Src) 97.6 F (36.4 C) (Oral)  Resp 16  Ht 5' 3.6" (1.615 m)  Wt 102.2 kg (225 lb 5 oz)  BMI 39.18 kg/m2  SpO2 94%  LMP 03/17/2012 Vital signs in last 24 hours: Temp:  [97.2 F (36.2 C)-98.1 F (36.7 C)] 97.6 F (36.4 C) (08/31 0550) Pulse Rate:  [75-96] 96 (08/31 0550) Resp:  [16-20] 16 (08/31 0550) BP: (110-151)/(72-109) 117/81 mmHg (08/31 0550) SpO2:  [94 %-100 %] 94 % (08/31 0550) Weight:  [102.059 kg (225 lb)-102.2 kg (225 lb 5 oz)] 102.2 kg (225 lb 5 oz) (08/30 2100)  Intake/Output from previous day: 08/30 0701 - 08/31 0700 In: 927.3 [I.V.:873.3; IV Piggyback:54] Out: 1250 [Urine:1250] Intake/Output this shift:   Nutritional status: Cardiac  Neurologic Exam: Alert, oriented, thought content appropriate. Speech fluent without evidence of aphasia. Able to follow 3 step commands without difficulty.  Cranial Nerves:  II: Discs flat bilaterally; Visual fields grossly normal, pupils equal, round, reactive to light and accommodation  III,IV, VI: ptosis not present, extra-ocular motions intact bilaterally  V,VII: smile symmetric, facial light touch sensation normal bilaterally  VIII: hearing normal bilaterally  IX,X: gag reflex present  XI: bilateral shoulder shrug  XII: midline tongue extension  Motor:  Right : Upper extremity 5/5         Left: Upper extremity 5/5   Lower extremity 5/5      Lower extremity 5/5  Give-way strength throughout with the patient needing encouragement to give full strength. Resists less on the left  Tone and bulk:normal tone throughout; no atrophy noted  Sensory: Pinprick and light touch intact  throughout, bilaterally  Deep Tendon Reflexes: 2+ and symmetric throughout  Plantars:  Right: downgoing     Left: downgoing   Lab Results: Basic Metabolic Panel:  Recent Labs Lab 08/01/13 1806 08/02/13 0516  NA 138 136  K 3.7 3.5  CL 101 102  CO2 26 22  GLUCOSE 109* 188*  BUN 8 7  CREATININE 0.48* 0.51  CALCIUM 9.2 9.0    Liver Function Tests: No results found for this basename: AST, ALT, ALKPHOS, BILITOT, PROT, ALBUMIN,  in the last 168 hours No results found for this basename: LIPASE, AMYLASE,  in the last 168 hours No results found for this basename: AMMONIA,  in the last 168 hours  CBC:  Recent Labs Lab 08/01/13 1806 08/02/13 0516  WBC 7.6 5.6  HGB 14.2 14.0  HCT 39.2 39.4  MCV 88.1 88.5  PLT 316 297    Cardiac Enzymes: No results found for this basename: CKTOTAL, CKMB, CKMBINDEX, TROPONINI,  in the last 168 hours  Lipid Panel: No results found for this basename: CHOL, TRIG, HDL, CHOLHDL, VLDL, LDLCALC,  in the last 168 hours  CBG:  Recent Labs Lab 08/01/13 2306  GLUCAP 113*    Microbiology: Results for orders placed during the hospital encounter of 03/17/12  SURGICAL PCR SCREEN     Status: None   Collection Time    03/17/12  2:04 PM      Result Value Range Status   MRSA, PCR NEGATIVE  NEGATIVE Final   Staphylococcus aureus NEGATIVE  NEGATIVE Final   Comment:            The Xpert SA Assay (FDA     approved for NASAL specimens     only), is one component of     a comprehensive surveillance     program.  It is not intended     to diagnose infection nor to     guide or monitor treatment.    Coagulation Studies: No results found for this basename: LABPROT, INR,  in the last 72 hours  Imaging: Dg Lumbar Spine Complete  08/01/2013   *RADIOLOGY REPORT*  Clinical Data: Burning sensation and throughout the spine. Recurrent falls and incontinence for one month.  LUMBAR SPINE - COMPLETE 4+ VIEW  Comparison: MRI lumbar spine 06/20/2009.  Findings:  Vertebral body height and alignment are normal.  No pars interarticularis defect is identified.  Intervertebral disc space height is maintained.  Paraspinous structures demonstrate a lap band which is partially visualized.  Cholecystectomy clips are also noted.  IMPRESSION: Normal appearing lumbar spine.   Original Report Authenticated By: Holley Dexter, M.D.    Medications:  I have reviewed the patient's current medications. Scheduled: . sodium chloride   Intravenous STAT  . baclofen  5 mg Oral TID  . enoxaparin (LOVENOX) injection  50 mg Subcutaneous Q24H  . insulin aspart  0-9 Units Subcutaneous Q4H  . metFORMIN  500 mg Oral TID  . methylPREDNISolone (SOLU-MEDROL) injection  500 mg Intravenous Q12H  . pantoprazole (PROTONIX) IV  40 mg Intravenous Q24H  . pregabalin  100 mg Oral TID    Assessment/Plan: Patient stable s/p one dose of Solumedrol.  Pain continues to bean issue.  Spasticity seems to be improved.    Recommendations: 1.  D/C Lyrica.  Has not been helpful. 2.  Start Ultram 50mg  q6hours for pain.  Would refrain from narcotics 3.  Neurontin 300mg  BID for paresthesias.   4.  Continue steroids   LOS: 1 day   Thana Farr, MD [facility] [phone] 08/02/2013  8:08 AM

## 2013-08-03 DIAGNOSIS — G35 Multiple sclerosis: Secondary | ICD-10-CM

## 2013-08-03 LAB — GLUCOSE, CAPILLARY
Glucose-Capillary: 159 mg/dL — ABNORMAL HIGH (ref 70–99)
Glucose-Capillary: 168 mg/dL — ABNORMAL HIGH (ref 70–99)
Glucose-Capillary: 169 mg/dL — ABNORMAL HIGH (ref 70–99)
Glucose-Capillary: 170 mg/dL — ABNORMAL HIGH (ref 70–99)
Glucose-Capillary: 170 mg/dL — ABNORMAL HIGH (ref 70–99)
Glucose-Capillary: 178 mg/dL — ABNORMAL HIGH (ref 70–99)
Glucose-Capillary: 180 mg/dL — ABNORMAL HIGH (ref 70–99)

## 2013-08-03 MED ORDER — INTERFERON BETA-1A 44 MCG/0.5ML ~~LOC~~ SOLN
8.8000 ug | SUBCUTANEOUS | Status: DC
  Administered 2013-08-03: 8.8 ug via SUBCUTANEOUS

## 2013-08-03 MED ORDER — INTERFERON BETA-1A 22 MCG/0.5ML ~~LOC~~ SOLN
8.8000 ug | SUBCUTANEOUS | Status: DC
  Filled 2013-08-03 (×2): qty 0.2

## 2013-08-03 MED ORDER — HYDROMORPHONE HCL PF 1 MG/ML IJ SOLN
INTRAMUSCULAR | Status: AC
  Administered 2013-08-03: 1 mg via INTRAVENOUS
  Filled 2013-08-03: qty 1

## 2013-08-03 MED ORDER — HYDROMORPHONE HCL PF 1 MG/ML IJ SOLN
1.0000 mg | Freq: Once | INTRAMUSCULAR | Status: AC
  Administered 2013-08-03: 1 mg via INTRAVENOUS

## 2013-08-03 MED ORDER — INSULIN ASPART 100 UNIT/ML ~~LOC~~ SOLN
0.0000 [IU] | Freq: Three times a day (TID) | SUBCUTANEOUS | Status: DC
  Administered 2013-08-03 (×2): 3 [IU] via SUBCUTANEOUS
  Administered 2013-08-04: 2 [IU] via SUBCUTANEOUS
  Administered 2013-08-04 (×2): 3 [IU] via SUBCUTANEOUS
  Administered 2013-08-05: 2 [IU] via SUBCUTANEOUS

## 2013-08-03 MED ORDER — INTERFERON BETA-1A 44 MCG/0.5ML ~~LOC~~ SOLN
8.8000 ug | SUBCUTANEOUS | Status: DC
  Filled 2013-08-03: qty 0.1

## 2013-08-03 MED ORDER — INTERFERON BETA-1A 22 MCG/0.5ML ~~LOC~~ SOLN
8.8000 ug | SUBCUTANEOUS | Status: DC
  Filled 2013-08-03: qty 0.2

## 2013-08-03 MED ORDER — INSULIN ASPART 100 UNIT/ML ~~LOC~~ SOLN
0.0000 [IU] | Freq: Every day | SUBCUTANEOUS | Status: DC
  Administered 2013-08-04: 2 [IU] via SUBCUTANEOUS

## 2013-08-03 NOTE — Progress Notes (Signed)
Physical Therapy Treatment Patient Details Name: [name] MRN: [id number] DOB: August 16, 1976 Today's Date: 08/03/2013 Time: 1233-1300 PT Time Calculation (min): 27 min  PT Assessment / Plan / Recommendation  History of Present Illness Patient is a 37 yo female admitted with worsening of weakness/numbness of neck, arms, and back.  Patient with MS exacerbation - initiated Solumedrol.   PT Comments   Very motivated to improve functional mobility, and able to tolerate more activity today, with PT co-session and walking performed soon  After earlier  OT session; Continue to recommend comprehensive inpatient rehab (CIR) for post-acute therapy needs.   Follow Up Recommendations  CIR     Does the patient have the potential to tolerate intense rehabilitation     Barriers to Discharge        Equipment Recommendations  None recommended by PT    Recommendations for Other Services Rehab consult  Frequency Min 4X/week   Progress towards PT Goals Progress towards PT goals: Progressing toward goals  Plan Current plan remains appropriate    Precautions / Restrictions Precautions Precautions: Fall Precaution Comments: LE's "give way" per patient. Restrictions Weight Bearing Restrictions: No   Pertinent Vitals/Pain 8/10 pain in bil LEs post amb patient repositioned for comfort RN informed     Mobility  Bed Mobility Bed Mobility: Supine to Sit;Sitting - Scoot to Edge of Bed Supine to Sit: HOB elevated;With rails;5: Supervision Sitting - Scoot to Edge of Bed: With rail;5: Supervision Transfers Transfers: Sit to Stand;Stand to Sit Sit to Stand: 4: Min assist;With upper extremity assist Stand to Sit: 4: Min assist Details for Transfer Assistance: Verbal cues for hand placement.  Assist to rise to standing. Ambulation/Gait Ambulation/Gait Assistance: 1: +2 Total assist (For safety with increasing distance) Ambulation/Gait: Patient Percentage: 70% Ambulation Distance (Feet): 10  Feet Assistive device: Rolling walker Ambulation/Gait Assistance Details: Continuing cues for posture, and safe use of RW; Cues also to self-monitor for activity tolerance with increasing amb distance; noted fatigue and pt stateing her R LE feeling like it would buckle; Returned to sitting Gait Pattern: Decreased step length - left;Trunk flexed    Exercises     PT Diagnosis:    PT Problem List:   PT Treatment Interventions:     PT Goals (current goals can now be found in the care plan section) Acute Rehab PT Goals Patient Stated Goal: To be able to wash my own hair, get up the stairs. PT Goal Formulation: With patient Time For Goal Achievement: 08/16/13 Potential to Achieve Goals: Good  Visit Information  Last PT Received On: 08/03/13 Assistance Needed: +2 (for safety/chair) PT/OT Co-Evaluation/Treatment: Yes History of Present Illness: Patient is a 37 yo female admitted with worsening of weakness/numbness of neck, arms, and back.  Patient with MS exacerbation - initiated Solumedrol.    Subjective Data  Subjective: Very motivated to work; Science writer helps with UE tremors Patient Stated Goal: To be able to wash my own hair, get up the stairs.   Cognition  Cognition Arousal/Alertness: Awake/alert Behavior During Therapy: WFL for tasks assessed/performed Overall Cognitive Status: Within Functional Limits for tasks assessed    Balance     End of Session PT - End of Session Equipment Utilized During Treatment: Gait belt Activity Tolerance: Patient limited by fatigue;Other (comment) (but hard-worker) Patient left: in chair;with call bell/phone within reach;with family/visitor present Nurse Communication: Mobility status   GP     Van Clines [facility]  08/03/2013, 4:10 PM

## 2013-08-03 NOTE — Progress Notes (Signed)
Rehab Admissions Coordinator Note:  Patient was screened by Trish Mage for appropriateness for an Inpatient Acute Rehab Consult. Noted PT recommending CIR.   At this time, we are recommending Inpatient Rehab consult.  Note patient may progress to where inpatient rehab is not needed, but at this time may need inpatient rehab consult.  Lelon Frohlich M 08/03/2013, 9:07 AM  I can be reached at [phone].

## 2013-08-03 NOTE — Clinical Social Work Note (Signed)
CSW received consult for transportation needs. Patient does not have transportation concerns. Patient has concerns about [facility] services after CIR and questions about whether or not CIR will be covered if she obtains Medicaid. RNCM notified of these needs. Patient has requested assistance with letting her daughter's school know that she is currently in the hospital to explain why her daughter has not attended school yet. CSW will create a note for patient to notify school of her hospital stay. Patient states that family and friends will assist with getting daughter registered for school.  Roddie Mc, [place], [place], [phone]

## 2013-08-03 NOTE — Progress Notes (Signed)
Patient ID: [name]  female  WUJ:[id number]    DOB: [birth year]    DOA: 08/01/2013  PCP: Thomos Lemons, DO  Assessment/Plan: Principal Problem:   Exacerbation of multiple sclerosis: Slight improvement after one dose of Solu-Medrol, - Appreciate neurology recommendations, continue steroids - No narcotics - Patient's has her own Rebif, she is on Monday Wednesday Friday, will need today, pharmacy consult for adding the order -  PT recommending inpatient rehabilitation, consult placed - Patient also reports flulike symptoms after the rebif dose which is normal for her and last about 24 hours  Active Problems:   DIABETES MELLITUS, TYPE II- BS elevated due to steroids - Continue sliding scale insulin-change to moderate - Hemoglobin A1c 6.2    OBESITY, MORBID - Counseled patient on diet and weight control    HYPERTENSION:  fairly stable     ASTHMA: Stable    GERD: Continue PPI  DVT Prophylaxis:  Code Status:  Disposition: Not medically ready    Subjective: Feeling better, sensation improving in the lower extremities  Objective: Weight change:   Intake/Output Summary (Last 24 hours) at 08/03/13 1117 Last data filed at 08/03/13 0602  Gross per 24 hour  Intake 1597.67 ml  Output   2300 ml  Net -702.33 ml   Blood pressure 145/86, pulse 78, temperature 97.7 F (36.5 C), temperature source Oral, resp. rate 18, height 5' 3.6" (1.615 m), weight 102.2 kg (225 lb 5 oz), last menstrual period 03/17/2012, SpO2 97.00%.  Physical Exam: General: Ax O x3 CVS: S1-S2 clear Chest: CTAB Abdomen: Obese, soft NT, ND, NBS Extremities: no c/c/e Neuro: Cranial nerves II-XII intact, no focal neurological deficits  Lab Results: Basic Metabolic Panel:  Recent Labs Lab 08/01/13 1806 08/02/13 0516  NA 138 136  K 3.7 3.5  CL 101 102  CO2 26 22  GLUCOSE 109* 188*  BUN 8 7  CREATININE 0.48* 0.51  CALCIUM 9.2 9.0   Liver Function Tests: No results found for this basename: AST, ALT,  ALKPHOS, BILITOT, PROT, ALBUMIN,  in the last 168 hours No results found for this basename: LIPASE, AMYLASE,  in the last 168 hours No results found for this basename: AMMONIA,  in the last 168 hours CBC:  Recent Labs Lab 08/01/13 1806 08/02/13 0516  WBC 7.6 5.6  HGB 14.2 14.0  HCT 39.2 39.4  MCV 88.1 88.5  PLT 316 297   Cardiac Enzymes: No results found for this basename: CKTOTAL, CKMB, CKMBINDEX, TROPONINI,  in the last 168 hours BNP: No components found with this basename: POCBNP,  CBG:  Recent Labs Lab 08/02/13 1629 08/02/13 2013 08/03/13 0035 08/03/13 0426 08/03/13 0744  GLUCAP 173* 176* 168* 169* 170*     Micro Results: Recent Results (from the past 240 hour(s))  URINE CULTURE     Status: None   Collection Time    08/01/13  6:27 PM      Result Value Range Status   Specimen Description URINE, CLEAN CATCH   Final   Special Requests NONE   Final   Culture  Setup Time     Final   Value: 08/01/2013 22:49     Performed at [facility] Count     Final   Value: 10,000 COLONIES/ML     Performed at [facility]   Culture     Final   Value: Multiple bacterial morphotypes present, none predominant. Suggest appropriate recollection if clinically indicated.     Performed at [facility]  Report Status 08/02/2013 FINAL   Final    Studies/Results: Dg Lumbar Spine Complete  08/01/2013   *RADIOLOGY REPORT*  Clinical Data: Burning sensation and throughout the spine. Recurrent falls and incontinence for one month.  LUMBAR SPINE - COMPLETE 4+ VIEW  Comparison: MRI lumbar spine 06/20/2009.  Findings: Vertebral body height and alignment are normal.  No pars interarticularis defect is identified.  Intervertebral disc space height is maintained.  Paraspinous structures demonstrate a lap band which is partially visualized.  Cholecystectomy clips are also noted.  IMPRESSION: Normal appearing lumbar spine.   Original Report Authenticated By: Holley Dexter, M.D.   Mr [name] ZO Contrast  07/23/2013   *RADIOLOGY REPORT*  Clinical Data:  Multiple sclerosis.  MRI HEAD WITHOUT AND WITH CONTRAST MRI CERVICAL SPINE WITHOUT AND WITH CONTRAST  Technique:  Multiplanar, multiecho pulse sequences of the brain and surrounding structures, and cervical spine, to include the craniocervical junction and cervicothoracic junction, were obtained without and with intravenous contrast.  Contrast: 20mL MULTIHANCE GADOBENATE DIMEGLUMINE 529 MG/ML IV SOLN  Comparison:   Prior MRI from 06/19/2013.  MRI HEAD  Findings: Scattered foci of periventricular T2 / FLAIR signal are seen within the periventricular white matter, not significantly changed as compared to the prior exam. Lesion within the right thalamus is also stable.  These findings are most consistent with an underlying demyelinating process.  No new lesions are identified.  No enhancing lesions are seen on post contrast sequences.  There is no diffusion weighted signal abnormality to suggest acute intracranial hemorrhage or infarct.  IMPRESSION:  Stable appearance of the brain with multiple T2 / FLAIR hyperintense lesions involving the periventricular white matter and right thalamus, consistent with multiple sclerosis. No new lesions or enhancing lesions identified.  MRI CERVICAL SPINE  Findings: The study is somewhat technically degraded by motion artifact.  The vertebral bodies are normally aligned with preservation of the normal cervical lordosis.  Signal intensity within the vertebral body bone marrow is normal.  The craniocervical junction is within normal limits.  The visualized posterior fossa is normal.  Signal intensity within the cervical spinal cord is within normal limits without definite evidence of demyelinating process. Heterogeneous T2 signal intensity seen on axial T2 sequences likely artifactual due to body habitus and motion, and is not seen on the corresponding sagittal sequences.  No enhancing lesions  identified.  At C2-3, there is no significant canal or neural foraminal stenosis.  At C3-4, there is no significant canal or neural foraminal stenosis.  At C4-5, mild degenerative disc bulges present without significant canal or foraminal stenosis.  At C5-6, degenerative disc disease with degenerative disc osteophyte and mild canal stenosis is again seen, not significantly changed.  At C6-7, there is minimal degenerative disc disease, also stable. No significant canal or foraminal stenosis.  C7-T1, no canal or foraminal stenosis.  IMPRESSION: 1.  Stable appearance of the cervical spine with no definite spinal cord signal abnormality to suggest underlying demyelinating disease. No enhancing lesions.  2.  Stable appearance of degenerative disc disease at C5-6 with mild canal and bilateral foraminal stenosis.   Original Report Authenticated By: Rise Mu, M.D.   Mr Cervical Spine W Wo Contrast  07/23/2013   *RADIOLOGY REPORT*  Clinical Data:  Multiple sclerosis.  MRI HEAD WITHOUT AND WITH CONTRAST MRI CERVICAL SPINE WITHOUT AND WITH CONTRAST  Technique:  Multiplanar, multiecho pulse sequences of the brain and surrounding structures, and cervical spine, to include the craniocervical junction and cervicothoracic junction, were obtained  without and with intravenous contrast.  Contrast: 20mL MULTIHANCE GADOBENATE DIMEGLUMINE 529 MG/ML IV SOLN  Comparison:   Prior MRI from 06/19/2013.  MRI HEAD  Findings: Scattered foci of periventricular T2 / FLAIR signal are seen within the periventricular white matter, not significantly changed as compared to the prior exam. Lesion within the right thalamus is also stable.  These findings are most consistent with an underlying demyelinating process.  No new lesions are identified.  No enhancing lesions are seen on post contrast sequences.  There is no diffusion weighted signal abnormality to suggest acute intracranial hemorrhage or infarct.  IMPRESSION:  Stable appearance of  the brain with multiple T2 / FLAIR hyperintense lesions involving the periventricular white matter and right thalamus, consistent with multiple sclerosis. No new lesions or enhancing lesions identified.  MRI CERVICAL SPINE  Findings: The study is somewhat technically degraded by motion artifact.  The vertebral bodies are normally aligned with preservation of the normal cervical lordosis.  Signal intensity within the vertebral body bone marrow is normal.  The craniocervical junction is within normal limits.  The visualized posterior fossa is normal.  Signal intensity within the cervical spinal cord is within normal limits without definite evidence of demyelinating process. Heterogeneous T2 signal intensity seen on axial T2 sequences likely artifactual due to body habitus and motion, and is not seen on the corresponding sagittal sequences.  No enhancing lesions identified.  At C2-3, there is no significant canal or neural foraminal stenosis.  At C3-4, there is no significant canal or neural foraminal stenosis.  At C4-5, mild degenerative disc bulges present without significant canal or foraminal stenosis.  At C5-6, degenerative disc disease with degenerative disc osteophyte and mild canal stenosis is again seen, not significantly changed.  At C6-7, there is minimal degenerative disc disease, also stable. No significant canal or foraminal stenosis.  C7-T1, no canal or foraminal stenosis.  IMPRESSION: 1.  Stable appearance of the cervical spine with no definite spinal cord signal abnormality to suggest underlying demyelinating disease. No enhancing lesions.  2.  Stable appearance of degenerative disc disease at C5-6 with mild canal and bilateral foraminal stenosis.   Original Report Authenticated By: Rise Mu, M.D.    Medications: Scheduled Meds: . baclofen  5 mg Oral TID  . enoxaparin (LOVENOX) injection  50 mg Subcutaneous Q24H  . gabapentin  300 mg Oral BID  . insulin aspart  0-9 Units Subcutaneous  Q4H  . metFORMIN  500 mg Oral TID WC  . methylPREDNISolone (SOLU-MEDROL) injection  500 mg Intravenous Q12H  . pantoprazole (PROTONIX) IV  40 mg Intravenous Q24H      LOS: 2 days   Treniya Lobb M.D. [facility] 08/03/2013, 11:17 AM Pager: [phone]  If 7PM-7AM, please contact night-coverage [email or web address] Password [email or web address]

## 2013-08-03 NOTE — Progress Notes (Signed)
Occupational Therapy Treatment Patient Details Name: [name] MRN: [id number] DOB: Jul 15, 1976 Today's Date: 08/03/2013 Time: 1233-1300 OT Time Calculation (min): 27 min  OT Assessment / Plan / Recommendation  History of present illness Patient is a 37 yo female admitted with worsening of weakness/numbness of neck, arms, and back.  Patient with MS exacerbation - initiated Solumedrol.   OT comments  Pt seen for second visit with PT for safety as pt has hx of LE buckling.  Focus on mobility as precursor to ADL.  Follow Up Recommendations  CIR    Barriers to Discharge  Decreased caregiver support;Inaccessible home environment    Equipment Recommendations  Tub/shower bench    Recommendations for Other Services Rehab consult  Frequency Min 3X/week   Progress towards OT Goals    Plan Discharge plan remains appropriate    Precautions / Restrictions Precautions Precautions: Fall Precaution Comments: LE's "give way" per patient. Restrictions Weight Bearing Restrictions: No   Pertinent Vitals/Pain 8/10 back, RN aware, premedicated, repositioned    ADL  Eating/Feeding: Minimal assistance (assist for opening containers, drops items from hands) Where Assessed - Eating/Feeding: Bed level Grooming: Teeth care;Minimal assistance;Brushing hair;Maximal assistance Where Assessed - Grooming: Supine, head of bed up Upper Body Bathing: Moderate assistance Where Assessed - Upper Body Bathing: Supported sitting Lower Body Bathing: +1 Total assistance Where Assessed - Lower Body Bathing: Supported sitting Upper Body Dressing: Minimal assistance Where Assessed - Upper Body Dressing: Unsupported sitting Lower Body Dressing: +1 Total assistance Where Assessed - Lower Body Dressing: Unsupported sitting Equipment Used: Gait belt;Rolling walker Transfers/Ambulation Related to ADLs: ambulated with PT/OT with min assist, chair following ADL Comments: Pt reports using knitting to help with shaking  she occasionally gets in her hands.    OT Diagnosis: Generalized weakness;Acute pain;Disturbance of vision  OT Problem List: Decreased strength;Decreased activity tolerance;Impaired balance (sitting and/or standing);Impaired vision/perception;Decreased knowledge of use of DME or AE;Obesity;Impaired UE functional use;Pain OT Treatment Interventions: Self-care/ADL training;Therapeutic exercise;DME and/or AE instruction;Patient/family education;Balance training;Energy conservation   OT Goals(current goals can now be found in the care plan section) Acute Rehab OT Goals Patient Stated Goal: To be able to wash my own hair, get up the stairs. OT Goal Formulation: With patient Time For Goal Achievement: 08/17/13 Potential to Achieve Goals: Good ADL Goals Pt Will Perform Grooming: with supervision;standing Pt Will Perform Upper Body Bathing: with set-up;sitting Pt Will Perform Lower Body Bathing: with supervision;sit to/from stand Pt Will Perform Upper Body Dressing: with set-up;sitting Pt Will Perform Lower Body Dressing: with supervision;sit to/from stand Pt Will Transfer to Toilet: with supervision;ambulating Pt Will Perform Toileting - Clothing Manipulation and hygiene: with supervision;sit to/from stand Pt Will Perform Tub/Shower Transfer: Tub transfer;with supervision;tub bench;ambulating Additional ADL Goal #1: Pt will independently generalize energy conservation strategies in ADL and mobility.  Visit Information  Last OT Received On: 08/03/13 Assistance Needed: +2 (for safety/chair) PT/OT Co-Evaluation/Treatment: Yes History of Present Illness: Patient is a 38 yo female admitted with worsening of weakness/numbness of neck, arms, and back.  Patient with MS exacerbation - initiated Solumedrol.    Subjective Data      Prior Functioning  Home Living Family/patient expects to be discharged to:: Inpatient rehab Living Arrangements: Spouse/significant other;Children (23 year old  daughter) Available Help at Discharge: Family;Available PRN/intermittently (husband works, daughter in school, mother is in from [place]) Type of Home: House Home Access: Stairs to enter Home Layout: Two level;Bed/bath upstairs Alternate Level Stairs-Number of Steps: Flight Alternate Level Stairs-Rails: Right Home Equipment: Environmental consultant - 2  wheels;Wheelchair - manual;Bedside commode Prior Function Level of Independence: Needs assistance Gait / Transfers Assistance Needed: pt primarily stayed on the couch and transferred to [facility] ADL's / Homemaking Assistance Needed: Assisted for all self care and IADL for last month. Comments: Pt with recent bowel and bladder incontinence. Communication Communication: No difficulties Dominant Hand: Right    Cognition  Cognition Arousal/Alertness: Awake/alert Behavior During Therapy: WFL for tasks assessed/performed Overall Cognitive Status: Within Functional Limits for tasks assessed    Mobility  Bed Mobility Bed Mobility: Supine to Sit;Sitting - Scoot to Edge of Bed Supine to Sit: HOB elevated;With rails;5: Supervision Sitting - Scoot to Edge of Bed: With rail;5: Supervision  Transfers Transfers: Sit to Stand;Stand to Sit Sit to Stand: 4: Min assist;With upper extremity assist Stand to Sit: 4: Min assist Details for Transfer Assistance: Verbal cues for hand placement.  Assist to rise to standing.    Exercises      Balance    End of Session OT - End of Session Activity Tolerance: Patient limited by fatigue;Patient limited by pain Patient left: in chair;with call bell/phone within reach;with nursing/sitter in room;with family/visitor present Nurse Communication: Patient requests pain meds  GO     Evern Bio 08/03/2013, 1:42 PM

## 2013-08-03 NOTE — Consult Note (Signed)
Physical Medicine and Rehabilitation Consult  Reason for Consult: MS exacerbation  Referring Physician:  Dr. Isidoro Donning   HPI: [name] is a 37 y.o. female with history of DM, HTN, MS treated with Rebif (M-W-F) who was admitted on 08/01/13 with multiple falls with difficulty walking as well as burning pain/numbness in arms/legs and  B/B incontinence. Followed by Dr. Anne Hahn but has not seen him since 2012. [name] Oral steroids ineffective and  She was evaluated by Dr. Thad Ranger and started on IV solumedrol as well as baclofen for spasticity with recommendations to avoid narcotics.  Paraesthesias improved but pain control poor. PT evaluation done and MD, PT recommending CIR  Patient is a 1 month history of progressive loss of function. No recent  infections. 2 months ago was working full time 60 hours per week as well as exercising. Review of Systems  Constitutional: Positive for malaise/fatigue. Negative for chills.  Eyes: Positive for blurred vision and double vision.  Gastrointestinal: Negative for nausea and vomiting.  Neurological: Positive for weakness.  Psychiatric/Behavioral: The patient has insomnia.   All other systems reviewed and are negative.   Past Medical History  Diagnosis Date  . Asthma   . Diabetes mellitus   . Hypertension   . PCOS (polycystic ovarian syndrome)   . Gastroparesis   . Polyneuropathy   . Multiple sclerosis     Dr. Anne Hahn  . Multiple sclerosis, primary progressive 2010   Past Surgical History  Procedure Laterality Date  . Cholecystectomy    . Dilation and curettage of uterus    . Laparoscopic gastric banding  01/26/08  . Cesarean section    . Iud removal  03/19/2012    denies  . Colon surgery    . Abdominal hysterectomy     Family History  Problem Relation Age of Onset  . Diabetes Mother   . Depression Mother   . Hypertension Mother   . Liver disease Father   .  Depression Daughter     bipolar depression  . Coronary artery disease Other   . Cancer Other     ovarian  . Anesthesia problems Neg Hx    Social History:  reports that she has never smoked. She has never used smokeless tobacco. She reports that she does not drink alcohol or use illicit drugs.  Allergies: No Known Allergies  Medications Prior to Admission  Medication Sig Dispense Refill  . Interferon Beta-1a (REBIF REBIDOSE [place]) Inject 8.8 mcg into the skin 3 (three) times a week.      . metFORMIN (GLUCOPHAGE) 500 MG tablet Take 500 mg by mouth 3 (three) times daily.      . pregabalin (LYRICA) 100 MG capsule Take 100 mg by mouth 3 (three) times daily.        Home: Home Living Family/patient expects to be discharged to:: Inpatient rehab Living Arrangements: Spouse/significant other Available Help at Discharge: Family;Available PRN/intermittently (Husband works long hours.  Patient at home alone) Type of Home: House (Duplex) Home Access: Stairs to enter Home Layout: Two level;Bed/bath upstairs Alternate Level Stairs-Number of Steps: Flight Alternate Level Stairs-Rails: Right Home Equipment: Walker - 2 wheels;Wheelchair - manual;Bedside commode  Functional History: Prior Function Comments: Patient is also incontinent of bowel and bladder - not on set program. Functional Status:  Mobility: Bed Mobility Bed Mobility: Supine to Sit;Sitting - Scoot to Edge of Bed;Sit to Supine Supine to Sit: 4: Min assist;HOB elevated;With rails Sitting -  Scoot to [place] of Bed: 4: Min assist;With rail Sit to Supine: 4: Min assist;HOB elevated Transfers Transfers: Sit to Stand;Stand to Sit Sit to Stand: 4: Min assist;With upper extremity assist;From bed Stand to Sit: 4: Min assist;With upper extremity assist;To bed Ambulation/Gait Ambulation/Gait Assistance: 4: Min assist Ambulation Distance (Feet): 6 Feet Assistive device: Rolling walker Ambulation/Gait Assistance Details: Verbal cues for safe  use of RW.  Patient able to ambulate short distance and LE's fatigue and patient becomes dizzy.  Returned to sitting. Gait Pattern: Step-through pattern;Decreased stride length;Shuffle;Trunk flexed    ADL:    Cognition: Cognition Overall Cognitive Status: Within Functional Limits for tasks assessed Orientation Level: Oriented X4 Cognition Arousal/Alertness: Awake/alert Behavior During Therapy: WFL for tasks assessed/performed Overall Cognitive Status: Within Functional Limits for tasks assessed  Blood pressure 145/86, pulse 78, temperature 97.7 F (36.5 C), temperature source Oral, resp. rate 18, height 5' 3.6" (1.615 m), weight 102.2 kg (225 lb 5 oz), last menstrual period 03/17/2012, SpO2 97.00%.  Physical Exam  Nursing note and vitals reviewed. Constitutional: She is oriented to person, place, and time. She appears well-developed and well-nourished.  HENT:  Head: Normocephalic and atraumatic.  Eyes: Conjunctivae and EOM are normal. Pupils are equal, round, and reactive to light.  Complains of blurring of vision when looking straight ahead  Neck: Normal range of motion.  Cardiovascular: Normal rate, regular rhythm and normal heart sounds.   Pulmonary/Chest: Effort normal and breath sounds normal.  Abdominal: Soft. Bowel sounds are normal. She exhibits no distension. There is no tenderness.  Musculoskeletal: Normal range of motion.  Neurological: She is alert and oriented to person, place, and time. No cranial nerve deficit. She exhibits normal muscle tone. Coordination normal.  Motor strength is 4/5 bilateral deltoid, bicep, tricep, grip, hip flexion, knee extensors, ankle dorsi flexion plantarflexion Cerebellar testing finger-nose-finger normal in the upper and lower limbs  Skin: Skin is warm and dry.  Psychiatric: She has a normal mood and affect.    Results for orders placed during the hospital encounter of 08/01/13 (from the past 24 hour(s))  GLUCOSE, CAPILLARY      Status: Abnormal   Collection Time    08/02/13 12:29 PM      Result Value Range   Glucose-Capillary 170 (*) 70 - 99 mg/dL  GLUCOSE, CAPILLARY     Status: Abnormal   Collection Time    08/02/13  4:29 PM      Result Value Range   Glucose-Capillary 173 (*) 70 - 99 mg/dL  GLUCOSE, CAPILLARY     Status: Abnormal   Collection Time    08/02/13  8:13 PM      Result Value Range   Glucose-Capillary 176 (*) 70 - 99 mg/dL   Comment 1 Notify RN    GLUCOSE, CAPILLARY     Status: Abnormal   Collection Time    08/03/13 12:35 AM      Result Value Range   Glucose-Capillary 168 (*) 70 - 99 mg/dL   Comment 1 Notify RN    GLUCOSE, CAPILLARY     Status: Abnormal   Collection Time    08/03/13  4:26 AM      Result Value Range   Glucose-Capillary 169 (*) 70 - 99 mg/dL   Comment 1 Notify RN    GLUCOSE, CAPILLARY     Status: Abnormal   Collection Time    08/03/13  7:44 AM      Result Value Range   Glucose-Capillary 170 (*) 70 - 99  mg/dL   Dg Lumbar Spine Complete  08/01/2013   *RADIOLOGY REPORT*  Clinical Data: Burning sensation and throughout the spine. Recurrent falls and incontinence for one month.  LUMBAR SPINE - COMPLETE 4+ VIEW  Comparison: MRI lumbar spine 06/20/2009.  Findings: Vertebral body height and alignment are normal.  No pars interarticularis defect is identified.  Intervertebral disc space height is maintained.  Paraspinous structures demonstrate a lap band which is partially visualized.  Cholecystectomy clips are also noted.  IMPRESSION: Normal appearing lumbar spine.   Original Report Authenticated By: Holley Dexter, M.D.    Assessment/Plan: 1. Diagnosis: Multiple sclerosis exacerbation with no new MRI lesions, reportedly responding to steroids Does the need for close, 24 hr/day medical supervision in concert with the patient's rehab needs make it unreasonable for this patient to be served in a less intensive setting? Yes 2. Co-Morbidities requiring supervision/potential  complications: Diabetes, morbid obesity 3. Due to bladder management, bowel management, safety, skin/wound care, disease management, medication administration, pain management and patient education, does the patient require 24 hr/day rehab nursing? Yes 4. Does the patient require coordinated care of a physician, rehab nurse, PT (1-2 hrs/day, 5 days/week) and OT (1-2 hrs/day, 5 days/week) to address physical and functional deficits in the context of the above medical diagnosis(es)? Yes Addressing deficits in the following areas: balance, endurance, locomotion, strength, transferring, bowel/bladder control, bathing, dressing, feeding, grooming and toileting 5. Can the patient actively participate in an intensive therapy program of at least 3 hrs of therapy per day at least 5 days per week? Yes 6. The potential for patient to make measurable gains while on inpatient rehab is excellent 7. Anticipated functional outcomes upon discharge from inpatient rehab are supervision to modified independent with PT, supervision to modified independent with OT, not applicable with SLP. 8. Estimated rehab length of stay to reach the above functional goals is: 1-2 weeks 9. Does the patient have adequate social supports to accommodate these discharge functional goals? Yes 10. Anticipated D/C setting: Home 11. Anticipated post D/C treatments: Outpt therapy 12. Overall Rehab/Functional Prognosis: excellent  RECOMMENDATIONS: This patient's condition is appropriate for continued rehabilitative care in the following setting: CIR Patient has agreed to participate in recommended program. Yes Note that insurance prior authorization may be required for reimbursement for recommended care.  Comment:     08/03/2013

## 2013-08-03 NOTE — Progress Notes (Signed)
Nutrition Brief Note  Patient identified on the Malnutrition Screening Tool (MST) Report for weight loss and poor intake. Patient with some weight loss over the past year, but not a significant amount. She has been "watching what I eat" for the past year. 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 Reviewed dietary recommendations for gastroparesis, low fiber diet. Encouraged patient to choose a chewable multivitamin for better absorption and tolerance. Patient has been eating well since admission, choosing tolerable foods from the menu.  Wt Readings from Last 15 Encounters:  08/01/13 225 lb 5 oz (102.2 kg)  07/09/13 225 lb (102.059 kg)  04/21/12 242 lb (109.77 kg)  03/29/12 256 lb 6.4 oz (116.302 kg)  03/25/12 252 lb (114.306 kg)  03/19/12 268 lb (121.564 kg)  03/19/12 268 lb (121.564 kg)  03/17/12 268 lb (121.564 kg)  02/18/12 264 lb (119.75 kg)  02/01/12 265 lb (120.203 kg)  12/15/10 255 lb (115.667 kg)  09/04/10 266 lb 4 oz (120.77 kg)  03/20/10 257 lb 12 oz (116.915 kg)  03/09/10 255 lb (115.667 kg)  01/19/10 256 lb 8 oz (116.348 kg)    Body mass index is 39.18 kg/(m^2). Patient meets criteria for obesity, class 2 based on current BMI.   Current diet order is heart healthy, patient is consuming approximately 75% of meals at this time. Labs and medications reviewed.   No nutrition interventions warranted at this time. If nutrition issues arise, please consult RD.   Joaquin Courts, RD, LDN, CNSC Pager [phone] After Hours Pager [phone]

## 2013-08-03 NOTE — Progress Notes (Signed)
Subjective: Patient reports that her paresthesias are better.  Her spasticity ismuch improved as well.  Her pain and mobility remain the biggest issue  Objective: Current vital signs: BP 145/86  Pulse 78  Temp(Src) 97.7 F (36.5 C) (Oral)  Resp 18  Ht 5' 3.6" (1.615 m)  Wt 102.2 kg (225 lb 5 oz)  BMI 39.18 kg/m2  SpO2 97%  LMP 03/17/2012 Vital signs in last 24 hours: Temp:  [97.4 F (36.3 C)-98.3 F (36.8 C)] 97.7 F (36.5 C) (09/01 0530) Pulse Rate:  [78-106] 78 (09/01 0530) Resp:  [18-20] 18 (09/01 0530) BP: (130-164)/(81-100) 145/86 mmHg (09/01 0530) SpO2:  [91 %-97 %] 97 % (09/01 0530)  Intake/Output from previous day: 08/31 0701 - 09/01 0700 In: 1597.7 [P.O.:3; I.V.:1486.7; IV Piggyback:108] Out: 2300 [Urine:2300] Intake/Output this shift:   Nutritional status: Cardiac  Neurologic Exam: Alert, oriented, thought content appropriate. Speech fluent without evidence of aphasia. Able to follow 3 step commands without difficulty.  Cranial Nerves:  II: Discs flat bilaterally; Visual fields grossly normal, pupils equal, round, reactive to light and accommodation  III,IV, VI: ptosis not present, extra-ocular motions intact bilaterally  V,VII: smile symmetric, facial light touch sensation normal bilaterally  VIII: hearing normal bilaterally  IX,X: gag reflex present  XI: bilateral shoulder shrug  XII: midline tongue extension  Motor:  Right : Upper extremity 5/5         Left: Upper extremity 5/5   Lower extremity 5/5      Lower extremity 5/5  Give-way strength throughout with the patient needing encouragement to give full strength. Resists less on the left  Tone and bulk:normal tone throughout; no atrophy noted  Sensory: Pinprick and light touch intact throughout, bilaterally  Deep Tendon Reflexes: 2+ and symmetric throughout  Plantars:  Right: downgoing     Left: downgoing    Lab Results: Basic Metabolic Panel:  Recent Labs Lab 08/01/13 1806 08/02/13 0516  NA  138 136  K 3.7 3.5  CL 101 102  CO2 26 22  GLUCOSE 109* 188*  BUN 8 7  CREATININE 0.48* 0.51  CALCIUM 9.2 9.0    Liver Function Tests: No results found for this basename: AST, ALT, ALKPHOS, BILITOT, PROT, ALBUMIN,  in the last 168 hours No results found for this basename: LIPASE, AMYLASE,  in the last 168 hours No results found for this basename: AMMONIA,  in the last 168 hours  CBC:  Recent Labs Lab 08/01/13 1806 08/02/13 0516  WBC 7.6 5.6  HGB 14.2 14.0  HCT 39.2 39.4  MCV 88.1 88.5  PLT 316 297    Cardiac Enzymes: No results found for this basename: CKTOTAL, CKMB, CKMBINDEX, TROPONINI,  in the last 168 hours  Lipid Panel: No results found for this basename: CHOL, TRIG, HDL, CHOLHDL, VLDL, LDLCALC,  in the last 168 hours  CBG:  Recent Labs Lab 08/02/13 1629 08/02/13 2013 08/03/13 0035 08/03/13 0426 08/03/13 0744  GLUCAP 173* 176* 168* 169* 170*    Microbiology: Results for orders placed during the hospital encounter of 08/01/13  URINE CULTURE     Status: None   Collection Time    08/01/13  6:27 PM      Result Value Range Status   Specimen Description URINE, CLEAN CATCH   Final   Special Requests NONE   Final   Culture  Setup Time     Final   Value: 08/01/2013 22:49     Performed at [facility] Count  Final   Value: 10,000 COLONIES/ML     Performed at [facility]   Culture     Final   Value: Multiple bacterial morphotypes present, none predominant. Suggest appropriate recollection if clinically indicated.     Performed at [facility]   Report Status 08/02/2013 FINAL   Final    Coagulation Studies: No results found for this basename: LABPROT, INR,  in the last 72 hours  Imaging: Dg Lumbar Spine Complete  08/01/2013   *RADIOLOGY REPORT*  Clinical Data: Burning sensation and throughout the spine. Recurrent falls and incontinence for one month.  LUMBAR SPINE - COMPLETE 4+ VIEW  Comparison: MRI lumbar spine  06/20/2009.  Findings: Vertebral body height and alignment are normal.  No pars interarticularis defect is identified.  Intervertebral disc space height is maintained.  Paraspinous structures demonstrate a lap band which is partially visualized.  Cholecystectomy clips are also noted.  IMPRESSION: Normal appearing lumbar spine.   Original Report Authenticated By: Holley Dexter, M.D.    Medications:  I have reviewed the patient's current medications. Scheduled: . baclofen  5 mg Oral TID  . enoxaparin (LOVENOX) injection  50 mg Subcutaneous Q24H  . gabapentin  300 mg Oral BID  . insulin aspart  0-9 Units Subcutaneous Q4H  . metFORMIN  500 mg Oral TID WC  . methylPREDNISolone (SOLU-MEDROL) injection  500 mg Intravenous Q12H  . pantoprazole (PROTONIX) IV  40 mg Intravenous Q24H    Assessment/Plan: 59 yaer old female with MS exacerbation.  S/P 3 doses of 500mg  Solumedrol.  Symptoms improving with medication adjustment.  Still complains of pain.  Spoke with patient at length this AM and will attempt to avoid use of narcotics at this time.  She is in agreement.    Recommendations: 1.  D/C Dilaudid, OxyIR and Percocet.  Will use Ultram prn 2.  Continue Solumedrol 3.  Continue therapy with CIR consult    LOS: 2 days   Thana Farr, MD [facility] [phone] 08/03/2013  8:50 AM

## 2013-08-03 NOTE — Evaluation (Signed)
Occupational Therapy Evaluation Patient Details Name: [name] MRN: [id number] DOB: 14-Oct-1976 Today's Date: 08/03/2013 Time: 4098-1191 OT Time Calculation (min): 36 min  OT Assessment / Plan / Recommendation History of present illness Patient is a 37 yo female admitted with worsening of weakness/numbness of neck, arms, and back.  Patient with MS exacerbation - initiated Solumedrol.   Clinical Impression   Pt admitted with MS exacerbation. Pt currently with functional limitations due to the deficits listed below (see OT Problem List).  Pt will benefit from skilled OT to increase their safety and independence with ADL and functional mobility for ADL to facilitate discharge to venue listed below. Pt is highly motivated to return to independence.     OT Assessment  Patient needs continued OT Services    Follow Up Recommendations  CIR    Barriers to Discharge Decreased caregiver support;Inaccessible home environment    Equipment Recommendations  Tub/shower bench    Recommendations for Other Services Rehab consult  Frequency  Min 3X/week    Precautions / Restrictions Precautions Precautions: Fall Precaution Comments: LE's "give way" per patient. Restrictions Weight Bearing Restrictions: No   Pertinent Vitals/Pain Back pain, premedicated, 6/10, repositioned    ADL  Eating/Feeding: Minimal assistance (assist for opening containers, drops items from hands) Where Assessed - Eating/Feeding: Bed level Grooming: Teeth care;Minimal assistance;Brushing hair;Maximal assistance Where Assessed - Grooming: Supine, head of bed up Upper Body Bathing: Moderate assistance Where Assessed - Upper Body Bathing: Supported sitting Lower Body Bathing: +1 Total assistance Where Assessed - Lower Body Bathing: Supported sitting Upper Body Dressing: Minimal assistance Where Assessed - Upper Body Dressing: Supported sitting Lower Body Dressing: +1 Total assistance Where Assessed - Lower Body  Dressing: Unsupported sitting Transfers/Ambulation Related to ADLs: did not transfer pt ADL Comments:  (pt is able to knit)    OT Diagnosis: Generalized weakness;Acute pain;Disturbance of vision  OT Problem List: Decreased strength;Decreased activity tolerance;Impaired balance (sitting and/or standing);Impaired vision/perception;Decreased knowledge of use of DME or AE;Obesity;Impaired UE functional use;Pain OT Treatment Interventions: Self-care/ADL training;Therapeutic exercise;DME and/or AE instruction;Patient/family education;Balance training;Energy conservation   OT Goals(Current goals can be found in the care plan section) Acute Rehab OT Goals Patient Stated Goal: To be able to wash my own hair, get up the stairs. OT Goal Formulation: With patient Time For Goal Achievement: 08/17/13 Potential to Achieve Goals: Good  Visit Information  Last OT Received On: 08/03/13 Assistance Needed: +2 History of Present Illness: Patient is a 37 yo female admitted with worsening of weakness/numbness of neck, arms, and back.  Patient with MS exacerbation - initiated Solumedrol.       Prior Functioning     Home Living Family/patient expects to be discharged to:: Inpatient rehab Living Arrangements: Spouse/significant other;Children (38 year old daughter) Available Help at Discharge: Family;Available PRN/intermittently (husband works, daughter in school, mother is in from [place]) Type of Home: House Home Access: Stairs to enter Home Layout: Two level;Bed/bath upstairs Alternate Level Stairs-Number of Steps: Flight Alternate Level Stairs-Rails: Right Home Equipment: Environmental consultant - 2 wheels;Wheelchair - manual;Bedside commode Prior Function Level of Independence: Needs assistance Gait / Transfers Assistance Needed: pt primarily stayed on the couch and transferred to [facility] ADL's / Homemaking Assistance Needed: Assisted for all self care and IADL for last month. Comments: Pt with recent bowel and bladder  incontinence. Communication Communication: No difficulties Dominant Hand: Right         Vision/Perception Vision - History Patient Visual Report:  (pt has periods of blurriness and darkness)   Cognition  Cognition Arousal/Alertness: Awake/alert Behavior During Therapy: WFL for tasks assessed/performed Overall Cognitive Status: Within Functional Limits for tasks assessed (pt was having difficulty with memory, orientation PTA)    Extremity/Trunk Assessment Upper Extremity Assessment Upper Extremity Assessment: RUE deficits/detail;LUE deficits/detail RUE Deficits / Details: 4-/5 strength, fatigues quickly. RUE Sensation:  (numbness on volar surface of forearm) LUE Deficits / Details: 4-/5 strength, fatigues easily. LUE Sensation:  (numbness on volar surface of forearm) Lower Extremity Assessment Lower Extremity Assessment: Defer to PT evaluation     Mobility Bed Mobility Bed Mobility: Supine to Sit;Sitting - Scoot to Edge of Bed;Sit to Supine Supine to Sit: HOB elevated;With rails;5: Supervision Sitting - Scoot to Edge of Bed: With rail;5: Supervision Sit to Supine: 4: Min assist;HOB elevated Transfers Transfers: Not assessed     Exercise     Balance Balance Balance Assessed: Yes Static Sitting Balance Static Sitting - Balance Support: Left upper extremity supported;Feet supported Static Sitting - Level of Assistance: 5: Stand by assistance Static Sitting - Comment/# of Minutes: 5 Dynamic Sitting Balance Dynamic Sitting - Level of Assistance: 4: Min assist   End of Session OT - End of Session Activity Tolerance: Patient limited by fatigue;Patient limited by pain Patient left: in bed;with call bell/phone within reach;with family/visitor present  GO     Evern Bio 08/03/2013, 1:05 PM [phone]

## 2013-08-04 ENCOUNTER — Telehealth: Payer: Self-pay | Admitting: Internal Medicine

## 2013-08-04 LAB — GLUCOSE, CAPILLARY
Glucose-Capillary: 167 mg/dL — ABNORMAL HIGH (ref 70–99)
Glucose-Capillary: 168 mg/dL — ABNORMAL HIGH (ref 70–99)
Glucose-Capillary: 129 mg/dL — ABNORMAL HIGH (ref 70–99)
Glucose-Capillary: 158 mg/dL — ABNORMAL HIGH (ref 70–99)
Glucose-Capillary: 203 mg/dL — ABNORMAL HIGH (ref 70–99)

## 2013-08-04 MED ORDER — POLYETHYLENE GLYCOL 3350 17 G PO PACK
17.0000 g | PACK | Freq: Once | ORAL | Status: AC
  Administered 2013-08-04: 17 g via ORAL
  Filled 2013-08-04: qty 1

## 2013-08-04 MED ORDER — HYDROMORPHONE HCL PF 1 MG/ML IJ SOLN
1.0000 mg | Freq: Once | INTRAMUSCULAR | Status: AC
  Administered 2013-08-04: 1 mg via INTRAVENOUS
  Filled 2013-08-04: qty 1

## 2013-08-04 MED ORDER — TRAMADOL HCL 50 MG PO TABS
100.0000 mg | ORAL_TABLET | Freq: Four times a day (QID) | ORAL | Status: DC | PRN
  Administered 2013-08-04 – 2013-08-05 (×3): 100 mg via ORAL
  Filled 2013-08-04 (×3): qty 2

## 2013-08-04 MED ORDER — BISACODYL 10 MG RE SUPP
10.0000 mg | Freq: Once | RECTAL | Status: AC
  Administered 2013-08-04: 10 mg via RECTAL
  Filled 2013-08-04: qty 1

## 2013-08-04 MED ORDER — PANTOPRAZOLE SODIUM 40 MG PO TBEC
40.0000 mg | DELAYED_RELEASE_TABLET | Freq: Every day | ORAL | Status: DC
  Administered 2013-08-04 – 2013-08-05 (×2): 40 mg via ORAL
  Filled 2013-08-04 (×2): qty 1

## 2013-08-04 NOTE — Clinical Social Work Psychosocial (Signed)
Clinical Social Work Department BRIEF PSYCHOSOCIAL ASSESSMENT 08/04/2013  Patient:  [name], [name]     Account Number:  [id number]     Admit date:  08/01/2013  Clinical Social Worker:  Lavell Luster  Date/Time:  08/04/2013 04:07 PM  Referred by:  Physician  Date Referred:  08/04/2013 Referred for  SNF Placement   Other Referral:   Interview type:  Patient Other interview type:    PSYCHOSOCIAL DATA Living Status:  FAMILY Admitted from facility:   Level of care:   Primary support name:  Louisa Favaro [phone] Primary support relationship to patient:  SPOUSE Degree of support available:   Support is strong.    CURRENT CONCERNS Current Concerns  Post-Acute Placement   Other Concerns:    SOCIAL WORK ASSESSMENT / PLAN CIR has been recommended for patient but CSW wants to work patient up for SNF as backup. CSW explained SNF search process and patient is agreeable to SNF search as back up plan. Patient currently lives at home with husband Barbara Cower and daughter Adela Lank. Patient states that she has had HH services but that these services were not enough. Patient states that she is at home most of the day by herself. Patient does not have previous SNF experience, but would like to stay in [facility] if possible or close to [place], [place] where her husband works. Patient has many concerns about her insurance situation. CSW assured patient that per [facility], CIR stay would be covered in some way or another and that we are just hoping for bed availability at this point.   Assessment/plan status:  Psychosocial Support/Ongoing Assessment of Needs Other assessment/ plan:   Complete FL2, PASRR, Fax out.   Information/referral to community resources:   SNF list left with patient for review.    PATIENT'S/FAMILY'S RESPONSE TO PLAN OF CARE: Patient was pleasant and appreciative of CSW visit. Patient is agreeable to SNF search process as "backup plan". Patient awaits bed offers.      Roddie Mc, [place], [place], [phone]

## 2013-08-04 NOTE — Telephone Encounter (Signed)
Patient has been admitted to [facility] as of Friday. She would like you to call her so she can give you an update on the whole situation. The # that I put in the contact info goes directly to her room #.

## 2013-08-04 NOTE — Care Management Note (Signed)
    Page 1 of 1   08/05/2013     2:39:17 PM   CARE MANAGEMENT NOTE 08/05/2013  Patient:  [name], [name]   Account Number:  [id number]  Date Initiated:  08/02/2013  Documentation initiated by:  [facility]  Subjective/Objective Assessment:   Exacerbation of multiple sclerosis     Action/Plan:   pt eval- rec CIR   Anticipated DC Date:  08/05/2013   Anticipated DC Plan:  IP REHAB FACILITY      DC Planning Services  CM consult      PAC Choice  IP REHAB   Choice offered to / List presented to:             Status of service:  Completed, signed off Medicare Important Message given?   (If response is "NO", the following Medicare IM given date fields will be blank) Date Medicare IM given:   Date Additional Medicare IM given:    Discharge Disposition:  IP REHAB FACILITY  Per UR Regulation:  Reviewed for med. necessity/level of care/duration of stay  If discussed at Long Length of Stay Meetings, dates discussed:    Comments:  08/05/13 14:38 Letha Cape RN, BSN [phone] patient dc to CIR.  08/04/13 15:34 Letha Cape RN, BSN [phone] per physical therapy recs CIR , CIR states patient is a candidate, Melanee Spry is the liason working with patient, she will let me know something shortly about when they could take patient.

## 2013-08-04 NOTE — Progress Notes (Signed)
Physical Therapy Treatment Patient Details Name: [name] MRN: [id number] DOB: 06/27/1976 Today's Date: 08/04/2013 Time: 1000-1039 PT Time Calculation (min): 39 min  PT Assessment / Plan / Recommendation  History of Present Illness pt reports she is battling a migraine. Pt lying in darkened room, but she is very willing to work with  PT   PT Comments   Pt was able to activate core and begin maintain activation of core while doing extremity exercise. She was limited by pain today, but was able to tolerate multiple exercises and mobility activities  Follow Up Recommendations  CIR     Does the patient have the potential to tolerate intense rehabilitation     Barriers to Discharge        Equipment Recommendations  None recommended by PT    Recommendations for Other Services    Frequency     Progress towards PT Goals Progress towards PT goals: Progressing toward goals  Plan Current plan remains appropriate    Precautions / Restrictions Precautions Precautions: Fall Precaution Comments: LE's "give way" per patient. Restrictions Weight Bearing Restrictions: No   Pertinent Vitals/Pain Pt c/o pain in right leg as she fatigues    Mobility  Bed Mobility Bed Mobility: Supine to Sit;Sitting - Scoot to Edge of Bed Supine to Sit: HOB elevated;With rails;5: Supervision Sitting - Scoot to Edge of Bed: With rail;5: Supervision Sit to Supine: 4: Min assist;HOB elevated Details for Bed Mobility Assistance: needs assist to lift right leg onto bed repeated sit to stand 5 times Transfers Transfers: Sit to Stand;Stand to Sit Sit to Stand: With upper extremity assist;5: Supervision Stand to Sit: 5: Supervision Details for Transfer Assistance: Pt does not push up with arms to stand Ambulation/Gait Ambulation/Gait Assistance: 3: Mod assist (For safety with increasing distance) Ambulation Distance (Feet): 5 Feet Assistive device: Rolling walker Ambulation/Gait Assistance Details: assist to  guide walker and cue to assist with advancement of RLE Gait Pattern: Decreased step length - left;Trunk flexed;Decreased step length - right;Left foot flat;Right foot flat;Wide base of support Gait velocity: very slow General Gait Details: pt with minimized swing through phase of gait with both legs... she essentially drags legs though with c/o pain, especially in right leg and calf Stairs: No Wheelchair Mobility Wheelchair Mobility: No    Exercises General Exercises - Lower Extremity Ankle Circles/Pumps: AROM;Both;10 reps;Supine Gluteal Sets: AROM;Both;5 reps;Supine Hip ABduction/ADduction: AROM;Left;5 reps;Limitations Hip Abduction/Adduction Limitations: pt with pain and inability to lift right leg Hip Flexion/Marching: AROM;Both;5 reps;Seated;AAROM;Supine Toe Raises: AROM;5 reps;Both;Seated Heel Raises: AROM;Both;5 reps;Seated Other Exercises Other Exercises: emphasis on core activation and strengthening in sitting, supine and standing.   PT Diagnosis:    PT Problem List:   PT Treatment Interventions:     PT Goals (current goals can now be found in the care plan section)    Visit Information  Last PT Received On: 08/04/13 History of Present Illness: pt reports she is battling a migraine. Pt lying in darkened room, but she is very willing to work with  PT    Subjective Data      Cognition  Cognition Arousal/Alertness: Awake/alert Behavior During Therapy: WFL for tasks assessed/performed Overall Cognitive Status: Within Functional Limits for tasks assessed    Balance  Balance Balance Assessed: Yes Static Sitting Balance Static Sitting - Balance Support: Left upper extremity supported;Feet supported;No upper extremity supported Static Sitting - Level of Assistance: 5: Stand by assistance;7: Independent Static Sitting - Comment/# of Minutes: Pt able to sit on EOB> 10 minutes  for exercise and preparation for other activity Dynamic Sitting Balance Dynamic Sitting - Balance  Support: Feet supported;Bilateral upper extremity supported Dynamic Sitting - Level of Assistance: 6: Modified independent (Device/Increase time) Static Standing Balance Static Standing - Balance Support: Bilateral upper extremity supported Static Standing - Level of Assistance: 6: Modified independent (Device/Increase time) Static Standing - Comment/# of Minutes: 3 minutes with core activation and emphasis on equal weight bearing on both legs  End of Session PT - End of Session Activity Tolerance: Patient limited by fatigue;Patient limited by pain Patient left: in bed;with nursing/sitter in room Nurse Communication: Mobility status   GP    [name], [place] [phone] 08/04/2013, 12:44 PM

## 2013-08-04 NOTE — Clinical Social Work Placement (Signed)
Clinical Social Work Department CLINICAL SOCIAL WORK PLACEMENT NOTE 08/04/2013  Patient:  [name], [name]  Account Number:  [id number] Admit date:  08/01/2013  Clinical Social Worker:  Cherre Blanc, [place]  Date/time:  08/04/2013 04:00 PM  Clinical Social Work is seeking post-discharge placement for this patient at the following level of care:   SKILLED NURSING   (*CSW will update this form in Epic as items are completed)   08/04/2013  Patient/family provided with [facility] Department of Clinical Social Work's list of facilities offering this level of care within the geographic area requested by the patient (or if unable, by the patient's family).  08/04/2013  Patient/family informed of their freedom to choose among providers that offer the needed level of care, that participate in Medicare, Medicaid or managed care program needed by the patient, have an available bed and are willing to accept the patient.  08/04/2013  Patient/family informed of [facility]' ownership interest in [facility], as well as of the fact that they are under no obligation to receive care at this facility.  PASARR submitted to EDS on 08/04/2013 PASARR number received from EDS on 08/04/2013  FL2 transmitted to all facilities in geographic area requested by pt/family on  08/04/2013 FL2 transmitted to all facilities within larger geographic area on 08/04/2013  Patient informed that his/her managed care company has contracts with or will negotiate with  certain facilities, including the following:     Patient/family informed of bed offers received:   Patient chooses bed at  Physician recommends and patient chooses bed at    Patient to be transferred to  on   Patient to be transferred to facility by   The following physician request were entered in Epic:   Additional Comments:   Roddie Mc, [place], [place], [phone]

## 2013-08-04 NOTE — Progress Notes (Signed)
Subjective: Patient awake and alert.  Reports that with therapy her pain has worsened and the Ultram is not working.    Objective: Current vital signs: BP 139/91  Pulse 73  Temp(Src) 97.4 F (36.3 C) (Oral)  Resp 18  Ht 5' 3.6" (1.615 m)  Wt 102.2 kg (225 lb 5 oz)  BMI 39.18 kg/m2  SpO2 94%  LMP 03/17/2012 Vital signs in last 24 hours: Temp:  [97.4 F (36.3 C)-98.4 F (36.9 C)] 97.4 F (36.3 C) (09/02 0610) Pulse Rate:  [64-73] 73 (09/02 0610) Resp:  [18] 18 (09/02 0610) BP: (139-156)/(80-91) 139/91 mmHg (09/02 0610) SpO2:  [94 %-98 %] 94 % (09/02 0610)  Intake/Output from previous day: 09/01 0701 - 09/02 0700 In: 1092.3 [I.V.:1038.3; IV Piggyback:54] Out: 3700 [Urine:3700] Intake/Output this shift: Total I/O In: -  Out: 600 [Urine:600] Nutritional status: Cardiac  Neurologic Exam: Alert, oriented, thought content appropriate. Speech fluent without evidence of aphasia. Able to follow 3 step commands without difficulty.  Cranial Nerves:  II: Discs flat bilaterally; Visual fields grossly normal, pupils equal, round, reactive to light and accommodation  III,IV, VI: ptosis not present, extra-ocular motions intact bilaterally  V,VII: smile symmetric, facial light touch sensation normal bilaterally  VIII: hearing normal bilaterally  IX,X: gag reflex present  XI: bilateral shoulder shrug  XII: midline tongue extension  Motor:  Right : Upper extremity 5/5          Left: Upper extremity 5/5   Lower extremity 5/5        Lower extremity 5/5  Give-way strength throughout with the patient needing encouragement to give full strength. Resists less on the left  Tone and bulk:normal tone throughout; no atrophy noted  Sensory: Pinprick and light touch intact throughout, bilaterally  Deep Tendon Reflexes: 2+ and symmetric throughout  Plantars:  Right: downgoing     Left: downgoing   Lab Results: Basic Metabolic Panel:  Recent Labs Lab 08/01/13 1806 08/02/13 0516  NA 138 136   K 3.7 3.5  CL 101 102  CO2 26 22  GLUCOSE 109* 188*  BUN 8 7  CREATININE 0.48* 0.51  CALCIUM 9.2 9.0    Liver Function Tests: No results found for this basename: AST, ALT, ALKPHOS, BILITOT, PROT, ALBUMIN,  in the last 168 hours No results found for this basename: LIPASE, AMYLASE,  in the last 168 hours No results found for this basename: AMMONIA,  in the last 168 hours  CBC:  Recent Labs Lab 08/01/13 1806 08/02/13 0516  WBC 7.6 5.6  HGB 14.2 14.0  HCT 39.2 39.4  MCV 88.1 88.5  PLT 316 297    Cardiac Enzymes: No results found for this basename: CKTOTAL, CKMB, CKMBINDEX, TROPONINI,  in the last 168 hours  Lipid Panel: No results found for this basename: CHOL, TRIG, HDL, CHOLHDL, VLDL, LDLCALC,  in the last 168 hours  CBG:  Recent Labs Lab 08/03/13 1937 08/03/13 2134 08/04/13 0618 08/04/13 0737 08/04/13 1156  GLUCAP 178* 170* 167* 168* 129*    Microbiology: Results for orders placed during the hospital encounter of 08/01/13  URINE CULTURE     Status: None   Collection Time    08/01/13  6:27 PM      Result Value Range Status   Specimen Description URINE, CLEAN CATCH   Final   Special Requests NONE   Final   Culture  Setup Time     Final   Value: 08/01/2013 22:49     Performed at [facility]  Colony Count     Final   Value: 10,000 COLONIES/ML     Performed at [facility]   Culture     Final   Value: Multiple bacterial morphotypes present, none predominant. Suggest appropriate recollection if clinically indicated.     Performed at [facility]   Report Status 08/02/2013 FINAL   Final    Coagulation Studies: No results found for this basename: LABPROT, INR,  in the last 72 hours  Imaging: No results found.  Medications:  I have reviewed the patient's current medications. Scheduled: . baclofen  5 mg Oral TID  . enoxaparin (LOVENOX) injection  50 mg Subcutaneous Q24H  . gabapentin  300 mg Oral BID  . insulin aspart  0-15  Units Subcutaneous TID WC  . insulin aspart  0-5 Units Subcutaneous QHS  . interferon beta-1a  8.8 mcg Subcutaneous Custom  . metFORMIN  500 mg Oral TID WC  . methylPREDNISolone (SOLU-MEDROL) injection  500 mg Intravenous Q12H  . pantoprazole  40 mg Oral Q1200    Assessment/Plan: Patient s/p 6 treatments.  Foley removed today.  Has had no episodes of bowel incontinence.    Recommendations: 1.  D/C steroids today 2.  Continue therapy 3.  Increase Ultram to two tablets q6   LOS: 3 days   Thana Farr, MD [facility] [phone] 08/04/2013  2:21 PM

## 2013-08-04 NOTE — Progress Notes (Signed)
Pt potential CIR admit.  She is agreeable to CIR-admit depends on bed availability & medical readiness. Will f/up.  [phone]

## 2013-08-04 NOTE — Progress Notes (Signed)
Patient ID: [name]  female  ZOX:[id number]    DOB: Oct 24, 1976    DOA: 08/01/2013  PCP: Gabriela Lemons, DO  Assessment/Plan: Principal Problem:   Exacerbation of multiple sclerosis: Slight improvement after one dose of Solu-Medrol, - Appreciate neurology recommendations, continue IV steroids - Patient's has her own Rebif, she is on Monday Wednesday Friday, she took one dose yesterday, so far no side effects -  PT recommending inpatient rehabilitation - Patient also reports flulike symptoms after the rebif dose which is normal for her and last about 24 hours  Active Problems:   DIABETES MELLITUS, TYPE II- BS elevated due to steroids - Continue sliding scale insulin-change to moderate - Hemoglobin A1c 6.2    OBESITY, MORBID - Counseled patient on diet and weight control    HYPERTENSION:  fairly stable     ASTHMA: Stable    GERD: Continue PPI  DVT Prophylaxis:  Code Status:  Disposition: Not medically ready    Subjective: Feeling better, sensation improving in the lower extremities  Objective: Weight change:   Intake/Output Summary (Last 24 hours) at 08/04/13 1413 Last data filed at 08/04/13 1130  Gross per 24 hour  Intake 1092.33 ml  Output   3000 ml  Net -1907.67 ml   Blood pressure 139/91, pulse 73, temperature 97.4 F (36.3 C), temperature source Oral, resp. rate 18, height 5' 3.6" (1.615 m), weight 102.2 kg (225 lb 5 oz), last menstrual period 03/17/2012, SpO2 94.00%.  Physical Exam: General: Ax O x3 CVS: S1-S2 clear Chest: CTAB Abdomen: Obese, soft NT, ND, NBS Extremities: no c/c/e Neuro: Cranial nerves II-XII intact, no focal neurological deficits  Lab Results: Basic Metabolic Panel:  Recent Labs Lab 08/01/13 1806 08/02/13 0516  NA 138 136  K 3.7 3.5  CL 101 102  CO2 26 22  GLUCOSE 109* 188*  BUN 8 7  CREATININE 0.48* 0.51  CALCIUM 9.2 9.0   Liver Function Tests: No results found for this basename: AST, ALT, ALKPHOS, BILITOT, PROT, ALBUMIN,   in the last 168 hours No results found for this basename: LIPASE, AMYLASE,  in the last 168 hours No results found for this basename: AMMONIA,  in the last 168 hours CBC:  Recent Labs Lab 08/01/13 1806 08/02/13 0516  WBC 7.6 5.6  HGB 14.2 14.0  HCT 39.2 39.4  MCV 88.1 88.5  PLT 316 297   Cardiac Enzymes: No results found for this basename: CKTOTAL, CKMB, CKMBINDEX, TROPONINI,  in the last 168 hours BNP: No components found with this basename: POCBNP,  CBG:  Recent Labs Lab 08/03/13 1937 08/03/13 2134 08/04/13 0618 08/04/13 0737 08/04/13 1156  GLUCAP 178* 170* 167* 168* 129*     Micro Results: Recent Results (from the past 240 hour(s))  URINE CULTURE     Status: None   Collection Time    08/01/13  6:27 PM      Result Value Range Status   Specimen Description URINE, CLEAN CATCH   Final   Special Requests NONE   Final   Culture  Setup Time     Final   Value: 08/01/2013 22:49     Performed at [facility] Count     Final   Value: 10,000 COLONIES/ML     Performed at [facility]   Culture     Final   Value: Multiple bacterial morphotypes present, none predominant. Suggest appropriate recollection if clinically indicated.     Performed at [facility]   Report  Status 08/02/2013 FINAL   Final    Studies/Results: Dg Lumbar Spine Complete  08/01/2013   *RADIOLOGY REPORT*  Clinical Data: Burning sensation and throughout the spine. Recurrent falls and incontinence for one month.  LUMBAR SPINE - COMPLETE 4+ VIEW  Comparison: MRI lumbar spine 06/20/2009.  Findings: Vertebral body height and alignment are normal.  No pars interarticularis defect is identified.  Intervertebral disc space height is maintained.  Paraspinous structures demonstrate a lap band which is partially visualized.  Cholecystectomy clips are also noted.  IMPRESSION: Normal appearing lumbar spine.   Original Report Authenticated By: Holley Dexter, M.D.   Mr [name] ZO  Contrast  07/23/2013   *RADIOLOGY REPORT*  Clinical Data:  Multiple sclerosis.  MRI HEAD WITHOUT AND WITH CONTRAST MRI CERVICAL SPINE WITHOUT AND WITH CONTRAST  Technique:  Multiplanar, multiecho pulse sequences of the brain and surrounding structures, and cervical spine, to include the craniocervical junction and cervicothoracic junction, were obtained without and with intravenous contrast.  Contrast: 20mL MULTIHANCE GADOBENATE DIMEGLUMINE 529 MG/ML IV SOLN  Comparison:   Prior MRI from 06/19/2013.  MRI HEAD  Findings: Scattered foci of periventricular T2 / FLAIR signal are seen within the periventricular white matter, not significantly changed as compared to the prior exam. Lesion within the right thalamus is also stable.  These findings are most consistent with an underlying demyelinating process.  No new lesions are identified.  No enhancing lesions are seen on post contrast sequences.  There is no diffusion weighted signal abnormality to suggest acute intracranial hemorrhage or infarct.  IMPRESSION:  Stable appearance of the brain with multiple T2 / FLAIR hyperintense lesions involving the periventricular white matter and right thalamus, consistent with multiple sclerosis. No new lesions or enhancing lesions identified.  MRI CERVICAL SPINE  Findings: The study is somewhat technically degraded by motion artifact.  The vertebral bodies are normally aligned with preservation of the normal cervical lordosis.  Signal intensity within the vertebral body bone marrow is normal.  The craniocervical junction is within normal limits.  The visualized posterior fossa is normal.  Signal intensity within the cervical spinal cord is within normal limits without definite evidence of demyelinating process. Heterogeneous T2 signal intensity seen on axial T2 sequences likely artifactual due to body habitus and motion, and is not seen on the corresponding sagittal sequences.  No enhancing lesions identified.  At C2-3, there is no  significant canal or neural foraminal stenosis.  At C3-4, there is no significant canal or neural foraminal stenosis.  At C4-5, mild degenerative disc bulges present without significant canal or foraminal stenosis.  At C5-6, degenerative disc disease with degenerative disc osteophyte and mild canal stenosis is again seen, not significantly changed.  At C6-7, there is minimal degenerative disc disease, also stable. No significant canal or foraminal stenosis.  C7-T1, no canal or foraminal stenosis.  IMPRESSION: 1.  Stable appearance of the cervical spine with no definite spinal cord signal abnormality to suggest underlying demyelinating disease. No enhancing lesions.  2.  Stable appearance of degenerative disc disease at C5-6 with mild canal and bilateral foraminal stenosis.   Original Report Authenticated By: Rise Mu, M.D.   Mr Cervical Spine W Wo Contrast  07/23/2013   *RADIOLOGY REPORT*  Clinical Data:  Multiple sclerosis.  MRI HEAD WITHOUT AND WITH CONTRAST MRI CERVICAL SPINE WITHOUT AND WITH CONTRAST  Technique:  Multiplanar, multiecho pulse sequences of the brain and surrounding structures, and cervical spine, to include the craniocervical junction and cervicothoracic junction, were obtained without  and with intravenous contrast.  Contrast: 20mL MULTIHANCE GADOBENATE DIMEGLUMINE 529 MG/ML IV SOLN  Comparison:   Prior MRI from 06/19/2013.  MRI HEAD  Findings: Scattered foci of periventricular T2 / FLAIR signal are seen within the periventricular white matter, not significantly changed as compared to the prior exam. Lesion within the right thalamus is also stable.  These findings are most consistent with an underlying demyelinating process.  No new lesions are identified.  No enhancing lesions are seen on post contrast sequences.  There is no diffusion weighted signal abnormality to suggest acute intracranial hemorrhage or infarct.  IMPRESSION:  Stable appearance of the brain with multiple T2 /  FLAIR hyperintense lesions involving the periventricular white matter and right thalamus, consistent with multiple sclerosis. No new lesions or enhancing lesions identified.  MRI CERVICAL SPINE  Findings: The study is somewhat technically degraded by motion artifact.  The vertebral bodies are normally aligned with preservation of the normal cervical lordosis.  Signal intensity within the vertebral body bone marrow is normal.  The craniocervical junction is within normal limits.  The visualized posterior fossa is normal.  Signal intensity within the cervical spinal cord is within normal limits without definite evidence of demyelinating process. Heterogeneous T2 signal intensity seen on axial T2 sequences likely artifactual due to body habitus and motion, and is not seen on the corresponding sagittal sequences.  No enhancing lesions identified.  At C2-3, there is no significant canal or neural foraminal stenosis.  At C3-4, there is no significant canal or neural foraminal stenosis.  At C4-5, mild degenerative disc bulges present without significant canal or foraminal stenosis.  At C5-6, degenerative disc disease with degenerative disc osteophyte and mild canal stenosis is again seen, not significantly changed.  At C6-7, there is minimal degenerative disc disease, also stable. No significant canal or foraminal stenosis.  C7-T1, no canal or foraminal stenosis.  IMPRESSION: 1.  Stable appearance of the cervical spine with no definite spinal cord signal abnormality to suggest underlying demyelinating disease. No enhancing lesions.  2.  Stable appearance of degenerative disc disease at C5-6 with mild canal and bilateral foraminal stenosis.   Original Report Authenticated By: Rise Mu, M.D.    Medications: Scheduled Meds: . baclofen  5 mg Oral TID  . enoxaparin (LOVENOX) injection  50 mg Subcutaneous Q24H  . gabapentin  300 mg Oral BID  . insulin aspart  0-15 Units Subcutaneous TID WC  . insulin aspart   0-5 Units Subcutaneous QHS  . interferon beta-1a  8.8 mcg Subcutaneous Custom  . metFORMIN  500 mg Oral TID WC  . methylPREDNISolone (SOLU-MEDROL) injection  500 mg Intravenous Q12H  . pantoprazole  40 mg Oral Q1200      LOS: 3 days   Malcolm Quast M.D. [facility] 08/04/2013, 2:13 PM Pager: [phone]  If 7PM-7AM, please contact night-coverage [email or web address] Password [email or web address]

## 2013-08-05 ENCOUNTER — Inpatient Hospital Stay (HOSPITAL_COMMUNITY)
Admission: RE | Admit: 2013-08-05 | Discharge: 2013-08-18 | DRG: 945 | Disposition: A | Discharge status: Home / Self Care | Payer: No Typology Code available for payment source | Source: Intra-hospital | Attending: Physical Medicine & Rehabilitation | Admitting: Physical Medicine & Rehabilitation

## 2013-08-05 ENCOUNTER — Encounter (HOSPITAL_COMMUNITY): Payer: Self-pay | Admitting: *Deleted

## 2013-08-05 DIAGNOSIS — R32 Unspecified urinary incontinence: Secondary | ICD-10-CM | POA: Diagnosis present

## 2013-08-05 DIAGNOSIS — J45909 Unspecified asthma, uncomplicated: Secondary | ICD-10-CM | POA: Diagnosis present

## 2013-08-05 DIAGNOSIS — E119 Type 2 diabetes mellitus without complications: Secondary | ICD-10-CM | POA: Diagnosis present

## 2013-08-05 DIAGNOSIS — G35 Multiple sclerosis: Secondary | ICD-10-CM

## 2013-08-05 DIAGNOSIS — Z6841 Body Mass Index (BMI) 40.0 and over, adult: Secondary | ICD-10-CM

## 2013-08-05 DIAGNOSIS — R262 Difficulty in walking, not elsewhere classified: Secondary | ICD-10-CM | POA: Diagnosis present

## 2013-08-05 DIAGNOSIS — Z5189 Encounter for other specified aftercare: Principal | ICD-10-CM

## 2013-08-05 DIAGNOSIS — K592 Neurogenic bowel, not elsewhere classified: Secondary | ICD-10-CM | POA: Diagnosis present

## 2013-08-05 DIAGNOSIS — G609 Hereditary and idiopathic neuropathy, unspecified: Secondary | ICD-10-CM | POA: Diagnosis present

## 2013-08-05 DIAGNOSIS — Z9181 History of falling: Secondary | ICD-10-CM

## 2013-08-05 DIAGNOSIS — R209 Unspecified disturbances of skin sensation: Secondary | ICD-10-CM

## 2013-08-05 DIAGNOSIS — I10 Essential (primary) hypertension: Secondary | ICD-10-CM | POA: Diagnosis present

## 2013-08-05 DIAGNOSIS — E876 Hypokalemia: Secondary | ICD-10-CM | POA: Diagnosis present

## 2013-08-05 DIAGNOSIS — B370 Candidal stomatitis: Secondary | ICD-10-CM | POA: Diagnosis present

## 2013-08-05 DIAGNOSIS — G4700 Insomnia, unspecified: Secondary | ICD-10-CM | POA: Diagnosis present

## 2013-08-05 LAB — GLUCOSE, CAPILLARY
Glucose-Capillary: 134 mg/dL — ABNORMAL HIGH (ref 70–99)
Glucose-Capillary: 100 mg/dL — ABNORMAL HIGH (ref 70–99)
Glucose-Capillary: 95 mg/dL (ref 70–99)
Glucose-Capillary: 104 mg/dL — ABNORMAL HIGH (ref 70–99)

## 2013-08-05 MED ORDER — INSULIN ASPART 100 UNIT/ML ~~LOC~~ SOLN: 0.0000 [IU] | Freq: Every day | SUBCUTANEOUS | Status: DC

## 2013-08-05 MED ORDER — BACLOFEN 5 MG HALF TABLET
5.0000 mg | ORAL_TABLET | Freq: Three times a day (TID) | ORAL | Status: DC
  Administered 2013-08-05 – 2013-08-18 (×40): 5 mg via ORAL
  Filled 2013-08-05 (×45): qty 1

## 2013-08-05 MED ORDER — ACETAMINOPHEN 325 MG PO TABS: 650.0000 mg | ORAL_TABLET | Freq: Four times a day (QID) | ORAL | Status: DC | PRN

## 2013-08-05 MED ORDER — TRAMADOL HCL 50 MG PO TABS
100.0000 mg | ORAL_TABLET | Freq: Four times a day (QID) | ORAL | Status: DC | PRN
  Administered 2013-08-06 – 2013-08-07 (×3): 100 mg via ORAL
  Filled 2013-08-05 (×4): qty 2

## 2013-08-05 MED ORDER — BISACODYL 10 MG RE SUPP
10.0000 mg | Freq: Every day | RECTAL | Status: DC | PRN
  Administered 2013-08-10 – 2013-08-14 (×2): 10 mg via RECTAL
  Filled 2013-08-05 (×2): qty 1

## 2013-08-05 MED ORDER — INSULIN ASPART 100 UNIT/ML ~~LOC~~ SOLN
0.0000 [IU] | Freq: Three times a day (TID) | SUBCUTANEOUS | Status: DC
  Administered 2013-08-08 – 2013-08-18 (×8): 2 [IU] via SUBCUTANEOUS

## 2013-08-05 MED ORDER — DIPHENHYDRAMINE HCL 25 MG PO CAPS: 25.0000 mg | ORAL_CAPSULE | Freq: Four times a day (QID) | ORAL | Status: DC | PRN

## 2013-08-05 MED ORDER — SENNOSIDES-DOCUSATE SODIUM 8.6-50 MG PO TABS
1.0000 | ORAL_TABLET | Freq: Every evening | ORAL | Status: DC | PRN
  Administered 2013-08-05: 1 via ORAL
  Filled 2013-08-05: qty 1

## 2013-08-05 MED ORDER — GABAPENTIN 300 MG PO CAPS
300.0000 mg | ORAL_CAPSULE | Freq: Two times a day (BID) | ORAL | Status: DC
  Administered 2013-08-05 – 2013-08-06 (×3): 300 mg via ORAL
  Filled 2013-08-05 (×6): qty 1

## 2013-08-05 MED ORDER — ONDANSETRON HCL 4 MG PO TABS
4.0000 mg | ORAL_TABLET | Freq: Four times a day (QID) | ORAL | Status: DC | PRN
  Administered 2013-08-06 – 2013-08-15 (×3): 4 mg via ORAL
  Filled 2013-08-05 (×3): qty 1

## 2013-08-05 MED ORDER — BACLOFEN 5 MG HALF TABLET: 5.0000 mg | ORAL_TABLET | Freq: Three times a day (TID) | ORAL | Status: DC

## 2013-08-05 MED ORDER — INSULIN ASPART 100 UNIT/ML ~~LOC~~ SOLN: 5.0000 [IU] | Freq: Three times a day (TID) | SUBCUTANEOUS | Status: DC

## 2013-08-05 MED ORDER — ONDANSETRON HCL 4 MG PO TABS: 4.0000 mg | ORAL_TABLET | Freq: Four times a day (QID) | ORAL | Status: DC | PRN

## 2013-08-05 MED ORDER — TRAMADOL HCL 50 MG PO TABS: 100.0000 mg | ORAL_TABLET | Freq: Four times a day (QID) | ORAL | Status: DC | PRN

## 2013-08-05 MED ORDER — METFORMIN HCL 500 MG PO TABS
1000.0000 mg | ORAL_TABLET | Freq: Two times a day (BID) | ORAL | Status: DC
  Administered 2013-08-05 – 2013-08-06 (×2): 1000 mg via ORAL
  Filled 2013-08-05 (×4): qty 2

## 2013-08-05 MED ORDER — GUAIFENESIN-DM 100-10 MG/5ML PO SYRP: 5.0000 mL | ORAL_SOLUTION | Freq: Four times a day (QID) | ORAL | Status: DC | PRN

## 2013-08-05 MED ORDER — ONDANSETRON HCL 4 MG/2ML IJ SOLN: 4.0000 mg | Freq: Four times a day (QID) | INTRAMUSCULAR | Status: DC | PRN

## 2013-08-05 MED ORDER — PANTOPRAZOLE SODIUM 40 MG PO TBEC
40.0000 mg | DELAYED_RELEASE_TABLET | Freq: Every day | ORAL | Status: DC
  Administered 2013-08-06 – 2013-08-18 (×12): 40 mg via ORAL
  Filled 2013-08-05 (×13): qty 1

## 2013-08-05 MED ORDER — HYDRALAZINE HCL 20 MG/ML IJ SOLN
5.0000 mg | INTRAMUSCULAR | Status: DC | PRN
  Administered 2013-08-05 (×2): 5 mg via INTRAVENOUS
  Filled 2013-08-05 (×2): qty 1

## 2013-08-05 MED ORDER — PROCHLORPERAZINE MALEATE 5 MG PO TABS
5.0000 mg | ORAL_TABLET | Freq: Four times a day (QID) | ORAL | Status: DC | PRN
  Administered 2013-08-06 – 2013-08-15 (×2): 5 mg via ORAL
  Filled 2013-08-05: qty 2

## 2013-08-05 MED ORDER — PROCHLORPERAZINE 25 MG RE SUPP
12.5000 mg | Freq: Four times a day (QID) | RECTAL | Status: DC | PRN
  Filled 2013-08-05: qty 1

## 2013-08-05 MED ORDER — ACETAMINOPHEN 325 MG PO TABS
325.0000 mg | ORAL_TABLET | ORAL | Status: DC | PRN
  Administered 2013-08-05 – 2013-08-18 (×10): 650 mg via ORAL
  Filled 2013-08-05 (×10): qty 2

## 2013-08-05 MED ORDER — LISINOPRIL 10 MG PO TABS: 10.0000 mg | ORAL_TABLET | Freq: Every day | ORAL | Status: DC

## 2013-08-05 MED ORDER — ENOXAPARIN SODIUM 60 MG/0.6ML ~~LOC~~ SOLN
50.0000 mg | SUBCUTANEOUS | Status: DC
  Administered 2013-08-05 – 2013-08-17 (×13): 50 mg via SUBCUTANEOUS
  Filled 2013-08-05 (×14): qty 0.6

## 2013-08-05 MED ORDER — PROCHLORPERAZINE EDISYLATE 5 MG/ML IJ SOLN
5.0000 mg | Freq: Four times a day (QID) | INTRAMUSCULAR | Status: DC | PRN
  Administered 2013-08-07 – 2013-08-09 (×2): 10 mg via INTRAMUSCULAR
  Filled 2013-08-05 (×2): qty 2

## 2013-08-05 MED ORDER — INTERFERON BETA-1A 22 MCG/0.5ML ~~LOC~~ SOLN
8.8000 ug | SUBCUTANEOUS | Status: DC
  Administered 2013-08-05 – 2013-08-07 (×2): 8.8 ug via SUBCUTANEOUS
  Filled 2013-08-05 (×2): qty 0.2

## 2013-08-05 MED ORDER — INTERFERON BETA-1A 22 MCG/0.5ML ~~LOC~~ SOLN: 8.8000 ug | SUBCUTANEOUS | Status: DC

## 2013-08-05 MED ORDER — FLEET ENEMA 7-19 GM/118ML RE ENEM
1.0000 | ENEMA | Freq: Once | RECTAL | Status: AC | PRN
Start: 2013-08-05 — End: 2013-08-05

## 2013-08-05 MED ORDER — ALUM & MAG HYDROXIDE-SIMETH 200-200-20 MG/5ML PO SUSP
30.0000 mL | Freq: Four times a day (QID) | ORAL | Status: DC | PRN
  Administered 2013-08-11 – 2013-08-16 (×5): 30 mL via ORAL
  Filled 2013-08-05 (×5): qty 30

## 2013-08-05 MED ORDER — NYSTATIN 100000 UNIT/ML MT SUSP
5.0000 mL | Freq: Four times a day (QID) | OROMUCOSAL | Status: AC
  Administered 2013-08-05 – 2013-08-06 (×4): 500000 [IU] via ORAL
  Filled 2013-08-05 (×5): qty 5

## 2013-08-05 NOTE — Progress Notes (Signed)
Pt arrived to unit at 1430 with mother at bedside. Reviewed rehab process and booklet with verbal understanding. Pt signed safety sheet and understands the safety plan. Call bell within reach, pt resting in bed with mother at bedside.

## 2013-08-05 NOTE — PMR Pre-admission (Signed)
PMR Admission Coordinator Pre-Admission Assessment  Patient: [name] is an 37 y.o., female MRN: [id number] DOB: 09/14/1976 Height: 5' 3.6" (161.5 cm) Weight: 102.2 kg (225 lb 5 oz)              Insurance Information HMO:      PPO:       PCP:       IPA:       80/20:       OTHER: yes PRIMARY: [employer] discount 100%      Policy#: active orange card      Subscriber: pt  Financial counselor: Lurline Hare [phone]  says this is charity discount-also, pt w/ possible Medicaid pending   Emergency Contact Information Contact Information   Name Relation Home Work Mobile   Terilyn, Sano                         Rosalie Gums Spouse                        Mother [phone]       [phone]     Current Medical History  Patient Admitting Diagnosis: Multiple sclerosis exacerbation with no new MRI lesions, reportedly responding to steroids   History of Present Illness: [name] is a 37 y.o. female with history of DM, HTN, MS treated with Rebif (M-W-F) who was admitted on 08/01/13 with multiple falls with difficulty walking as well as burning pain/numbness in arms/legs and B/B incontinence. Followed by Dr.  Hahn but has not seen him since 2012. [name] Oral steroids ineffective and She was evaluated by Dr. Thad Ranger and started on IV solumedrol as well as baclofen for spasticity with recommendations to avoid narcotics. Paraesthesias improved but pain control poor. PT evaluation done and MD, PT recommending CIR  Patient is a 1 month history of progressive loss of function. No recent infections. 2 months ago was working full time 60 hours per week as well as exercising.       Past Medical History  Past Medical History  Diagnosis Date  . Asthma   . Diabetes mellitus   . Hypertension   . PCOS (polycystic ovarian syndrome)   . Gastroparesis   . Polyneuropathy   . Multiple sclerosis     Dr.   Hahn  . Multiple sclerosis, primary progressive 2010    Family History  family history includes Cancer in her other; Coronary artery disease in her other; Depression in her daughter and mother; Diabetes in her mother; Hypertension in her mother; Liver disease in her father. There is no history of Anesthesia problems.  Prior Rehab/Hospitalizations:    Current Medications  Current facility-administered medications:acetaminophen (TYLENOL) suppository 650 mg, 650 mg, Rectal, Q6H PRN, Ron Parker, MD;  acetaminophen (TYLENOL) tablet 650 mg, 650 mg, Oral, Q6H PRN, Ron Parker, MD, 650 mg at 08/02/13 1544;  alum & mag hydroxide-simeth (MAALOX/MYLANTA) 200-200-20 MG/5ML suspension 30 mL, 30 mL, Oral, Q6H PRN, Ron Parker, MD baclofen (LIORESAL) tablet 5 mg, 5 mg, Oral, TID, Thana Farr, MD, 5 mg at 08/05/13 0943;  diphenhydrAMINE (BENADRYL) capsule 25 mg, 25 mg, Oral, Q6H PRN, Jinger Neighbors, NP, 25 mg at 08/02/13 0141;  enoxaparin (LOVENOX) injection 50 mg, 50 mg, Subcutaneous, Q24H, Flint Melter, MD, 50 mg at 08/04/13 2116;  gabapentin (NEURONTIN) capsule 300 mg, 300 mg, Oral, BID, Thana Farr, MD, 300 mg  at 08/05/13 0943 hydrALAZINE (APRESOLINE) injection 5 mg, 5 mg, Intravenous, Q4H PRN, Leda Gauze, NP, 5 mg at 08/05/13 1241;  insulin aspart (novoLOG) injection 0-15 Units, 0-15 Units, Subcutaneous, TID WC, Ripudeep K Rai, MD, 2 Units at 08/05/13 [id number];  insulin aspart (novoLOG) injection 0-5 Units, 0-5 Units, Subcutaneous, QHS, Ripudeep K Rai, MD, 2 Units at 08/04/13 2126 interferon beta-1a (REBIF) injection 8.8 mcg, 8.8 mcg, Subcutaneous, Q M,W,F, Kimberly Ballard Hammons, RPH;  metFORMIN (GLUCOPHAGE) tablet 500 mg, 500 mg, Oral, TID WC, Ripudeep K Rai, MD, 500 mg at 08/05/13 0720;  ondansetron (ZOFRAN) injection 4 mg, 4 mg, Intravenous, Q6H PRN, Ron Parker, MD, 4 mg at 08/04/13 1104;  ondansetron (ZOFRAN) tablet 4 mg, 4 mg, Oral, Q6H PRN, Ron Parker, MD pantoprazole (PROTONIX) EC tablet 40 mg, 40 mg, Oral, Q1200, Ripudeep K Rai, MD, 40 mg at 08/04/13 1232;  traMADol (ULTRAM) tablet 100 mg, 100 mg, Oral, Q6H PRN, Thana Farr, MD, 100 mg at 08/05/13 0944;  zolpidem (AMBIEN) tablet 5 mg, 5 mg, Oral, QHS PRN, Ron Parker, MD, 5 mg at 08/04/13 2116  Patients Current Diet: Cardiac  Precautions / Restrictions Precautions Precautions: Fall Precaution Comments: LE's "give way" per patient. Restrictions Weight Bearing Restrictions: No   Prior Activity Level  Loss of function over past month w/ increase in assist needed Home Assistive Devices / Equipment Home Assistive Devices/Equipment: Dan Humphreys (specify type);Wheelchair;Bedside commode/3-in-1;CBG Meter;Eyeglasses Home Equipment: Walker - 2 wheels;Wheelchair - manual;Bedside commode  Prior Functional Level Prior Function Level of Independence: Needs assistance Gait / Transfers Assistance Needed: pt primarily stayed on the couch and transferred to [facility] ADL's / Homemaking Assistance Needed: Assisted for all self care and IADL for last month. Comments: Pt with recent bowel and bladder incontinence.  Current Functional Level Cognition  Overall Cognitive Status: Impaired/Different from baseline Orientation Level: Oriented X4 General Comments: Pt is aware of short-term memory deficits, and appropriately journals    Extremity Assessment (includes Sensation/Coordination)          ADLs  Eating/Feeding: Independent Where Assessed - Eating/Feeding: Chair Grooming: Wash/dry hands;Teeth care;Min guard Where Assessed - Grooming: Supported standing;Unsupported sitting Upper Body Bathing: Moderate assistance Where Assessed - Upper Body Bathing: Supported sitting Lower Body Bathing: +1 Total assistance Where Assessed - Lower Body Bathing: Supported sitting Upper Body Dressing: Set up Where Assessed - Upper Body Dressing: Unsupported sitting Lower Body Dressing: Set up Where  Assessed - Lower Body Dressing: Unsupported sitting Toilet Transfer: Min guard Toilet Transfer Method: Stand pivot Acupuncturist: Extra wide bedside commode Toileting - Clothing Manipulation and Hygiene: Supervision/safety Where Assessed - Engineer, mining and Hygiene: Sit on 3-in-1 or toilet Equipment Used: Gait belt;Rolling walker Transfers/Ambulation Related to ADLs: ambulated with PT/OT with min assist, chair following ADL Comments: Pt stood x 4 minutes to brush teeth with chair behind, completed washing hands at sink in sitting. Pt sat in bed to donn socks. Tremors in hands noted with fatigue.  Focus on pt monitoring her own fatigue level and pain and request rest breaks.      Mobility  Bed Mobility: Supine to Sit;Sitting - Scoot to Edge of Bed Supine to Sit: HOB elevated;With rails;5: Supervision Sitting - Scoot to Edge of Bed: With rail;5: Supervision Sit to Supine: 4: Min assist;HOB elevated    Transfers  Transfers: Sit to Stand;Stand to Sit Sit to Stand: With upper extremity assist;5: Supervision Stand to Sit: With upper extremity assist;To chair/3-in-1;4: Min guard    Ambulation /  Gait / Stairs / Psychologist, prison and probation services  Ambulation/Gait Ambulation/Gait Assistance: 3: Mod assist (For safety with increasing distance) Ambulation/Gait: Patient Percentage:  (80% decreasing to 70% with fatigue) Ambulation Distance (Feet): 25 Feet (15, seated rest break, ten) Assistive device: Rolling walker Ambulation/Gait Assistance Details: Cues for gait sequence and occasional physical assist for RLE advancement; Cues to self-monitor; adjusted height of RWfor optimal fit/energy conservation of UEs Gait Pattern: Decreased step length - left;Trunk flexed;Decreased step length - right;Left foot flat;Right foot flat;Wide base of support Gait velocity: very slow General Gait Details: pt with minimized swing through phase of gait with both legs... she essentially drags legs  though with c/o pain, especially in right leg and calf Stairs: No Wheelchair Mobility Wheelchair Mobility: No    Posture / Balance Static Sitting Balance Static Sitting - Balance Support: Left upper extremity supported;Feet supported;No upper extremity supported Static Sitting - Level of Assistance: 5: Stand by assistance;7: Independent Static Sitting - Comment/# of Minutes: Pt able to sit on EOB> 10 minutes for exercise and preparation for other activity Dynamic Sitting Balance Dynamic Sitting - Balance Support: Feet supported;Bilateral upper extremity supported Dynamic Sitting - Level of Assistance: 6: Modified independent (Device/Increase time) Dynamic Sitting - Balance Activities: Lateral lean/weight shifting;Forward lean/weight shifting;Reaching for objects;Reaching across midline Dynamic Sitting - Comments: Patient with decrease in balance with weight shift, requiring assist for balance. Static Standing Balance Static Standing - Balance Support: Left upper extremity supported;During functional activity Static Standing - Level of Assistance: 5: Stand by assistance Static Standing - Comment/# of Minutes: 4    Special needs/care consideration  Skin: necrotic areas on abdomen from Rebif injections                               Bowel mgmt: LBM 08/04/13 currently continent Bladder mgmt: Foley d/c'd 08/04/13 she is using BSC  currently continent     Pt reports incontinence PTA, since began steroids, she says continence has improved Diabetic mgmt: yes     Previous Home Environment Living Arrangements: Spouse/significant other;Children (65 year old daughter) Available Help at Discharge: Family;Available PRN/intermittently (husband works, daughter in school, mother is in from [place]) Type of Home: House Home Layout: Two level;Bed/bath upstairs Alternate Level Stairs-Rails: Right Alternate Level Stairs-Number of Steps: Flight Home Access: Stairs to enter Home Care Services: No  Discharge  Living Setting Plans for Discharge Living Setting: Patient's home Type of Home at Discharge: Other (Comment) (Townhouse) Discharge Home Layout: Two level;Bed/bath upstairs (pt stays downstairs-uses bsc) Discharge Home Access: Stairs to enter Entrance Stairs-Rails: None Entrance Stairs-Number of Steps: 2 Does the patient have any problems obtaining your medications?: Yes (Describe) (there are financial issues)  Social/Family/Support Systems Patient Roles: Spouse;Parent; (daughter) Contact Information: [phone] Anticipated Caregiver: her mother, Corrie Dandy Ramirez,& her husband Anticipated Caregiver's Contact Information: mother's cell# [phone]; husband's cell# [phone] Ability/Limitations of Caregiver: S-Min A Caregiver Availability: 24/7 Discharge Plan Discussed with Primary Caregiver: Yes (pt's mother) Is Caregiver In Agreement with Plan?: Yes Does Caregiver/Family have Issues with Lodging/Transportation while Pt is in Rehab?: No    Goals/Additional Needs Patient/Family Goal for Rehab: S-Mod I Expected length of stay: 1-2 weeks Pt/Family Agrees to Admission and willing to participate: Yes Program Orientation Provided & Reviewed with Pt/Caregiver Including Roles  & Responsibilities: Yes   Decrease burden of Care through IP rehab admission: Patient/family education   Possible need for SNF placement upon discharge: no   Patient Condition: This patient's condition remains as  documented in the consult dated 08/03/13, in which the Rehabilitation Physician determined and documented that the patient's condition is appropriate for intensive rehabilitative care in an inpatient rehabilitation facility. Will admit to inpatient rehab today.  Preadmission Screen Completed By:  Brock Ra, 08/05/2013 12:48 PM ______________________________________________________________________   Discussed status with Dr. Riley Kill on 08/05/13 at 12:46pm and received telephone approval for admission  today.  Admission Coordinator:  Brock Ra, time 12:46pm/Date 08/05/13

## 2013-08-05 NOTE — H&P (Signed)
Physical Medicine and Rehabilitation Admission H&P  Chief Complaint   Patient presents with   .  MS exacerbation.   HPI: [name] is a 37 y.o. female with history of DM, HTN, MS treated with Rebif (M-W-F) who was admitted on 08/01/13 with multiple falls with difficulty walking as well as burning pain/numbness in arms/legs and B/B incontinence. Followed by Dr. Anne Hahn but has not seen him since 2012. [name] Oral steroids ineffective and She was evaluated by Dr. Thad Ranger and started on IV solumedrol as well as baclofen for spasticity with recommendations to avoid narcotics. Paraesthesias improved but pain control poor. PT evaluation done and MD, PT recommending CIR    Review of Systems  HENT: Positive for neck pain.  Eyes: Positive for blurred vision/double vision Respiratory: Negative for cough and shortness of breath.  Cardiovascular: Negative for chest pain and palpitations.  Gastrointestinal: Positive for constipation.  Genitourinary: Negative for urgency and frequency.  Musculoskeletal: Positive for back pain.  Neurological: Positive for sensory change (tingling from back to BLE. ), speech change and headaches.   Past Medical History   Diagnosis  Date   .  Asthma    .  Diabetes mellitus    .  Hypertension    .  PCOS (polycystic ovarian syndrome)    .  Gastroparesis    .  Polyneuropathy    .  Multiple sclerosis      Dr. Anne Hahn   .  Multiple sclerosis, primary progressive  2010    Past Surgical History   Procedure  Laterality  Date   .  Cholecystectomy     .  Dilation and curettage of uterus     .  Laparoscopic gastric banding   01/26/08   .  Cesarean section     .  Iud removal   03/19/2012     denies   .  Colon surgery     .  Abdominal hysterectomy      Family History   Problem  Relation  Age of Onset   .  Diabetes  Mother    .  Depression  Mother    .  Hypertension  Mother    .  Liver  disease  Father    .  Depression  Daughter      bipolar depression   .  Coronary artery disease  Other    .  Cancer  Other      ovarian   .  Anesthesia problems  Neg Hx     Social History: Married. Was working full time till 2 months ago. She reports that she has never smoked. She has never used smokeless tobacco. She reports that she does not drink alcohol or use illicit drugs.  Allergies: No Known Allergies  Medications Prior to Admission   Medication  Sig  Dispense  Refill   .  Interferon Beta-1a (REBIF REBIDOSE [place])  Inject 8.8 mcg into the skin 3 (three) times a week.     .  metFORMIN (GLUCOPHAGE) 500 MG tablet  Take 500 mg by mouth 3 (three) times daily.     .  pregabalin (LYRICA) 100 MG capsule  Take 100 mg by mouth 3 (three) times daily.      Home:  Home Living  Family/patient expects to be discharged to:: Inpatient rehab  Living Arrangements: Spouse/significant other;Children (32 year old daughter)  Available Help at Discharge: Family;Available PRN/intermittently (husband works, daughter in  school, mother is in from [place])  Type of Home: House  Home Access: Stairs to enter  Home Layout: Two level;Bed/bath upstairs  Alternate Level Stairs-Number of Steps: Flight  Alternate Level Stairs-Rails: Right  Home Equipment: Environmental consultant - 2 wheels;Wheelchair - manual;Bedside commode  Functional History:  Prior Function  Comments: Pt with recent bowel and bladder incontinence.  Functional Status:  Mobility:  Bed Mobility  Bed Mobility: Supine to Sit;Sitting - Scoot to Edge of Bed  Supine to Sit: HOB elevated;With rails;5: Supervision  Sitting - Scoot to Edge of Bed: With rail;5: Supervision  Sit to Supine: 4: Min assist;HOB elevated  Transfers  Transfers: Sit to Stand;Stand to Sit  Sit to Stand: With upper extremity assist;5: Supervision  Stand to Sit: 5: Supervision  Ambulation/Gait  Ambulation/Gait Assistance: 3: Mod assist (For safety with increasing distance)  Ambulation/Gait:  Patient Percentage: 70%  Ambulation Distance (Feet): 5 Feet  Assistive device: Rolling walker  Ambulation/Gait Assistance Details: assist to guide walker and cue to assist with advancement of RLE  Gait Pattern: Decreased step length - left;Trunk flexed;Decreased step length - right;Left foot flat;Right foot flat;Wide base of support  Gait velocity: very slow  General Gait Details: pt with minimized swing through phase of gait with both legs... she essentially drags legs though with c/o pain, especially in right leg and calf  Stairs: No  Wheelchair Mobility  Wheelchair Mobility: No  ADL:  ADL  Eating/Feeding: Minimal assistance (assist for opening containers, drops items from hands)  Where Assessed - Eating/Feeding: Bed level  Grooming: Teeth care;Minimal assistance;Brushing hair;Maximal assistance  Where Assessed - Grooming: Supine, head of bed up  Upper Body Bathing: Moderate assistance  Where Assessed - Upper Body Bathing: Supported sitting  Lower Body Bathing: +1 Total assistance  Where Assessed - Lower Body Bathing: Supported sitting  Upper Body Dressing: Minimal assistance  Where Assessed - Upper Body Dressing: Unsupported sitting  Lower Body Dressing: +1 Total assistance  Where Assessed - Lower Body Dressing: Unsupported sitting  Equipment Used: Gait belt;Rolling walker  Transfers/Ambulation Related to ADLs: ambulated with PT/OT with min assist, chair following  ADL Comments: Pt reports using knitting to help with shaking she occasionally gets in her hands.  Cognition:  Cognition  Overall Cognitive Status: Within Functional Limits for tasks assessed  Orientation Level: Oriented X4  Cognition  Arousal/Alertness: Awake/alert  Behavior During Therapy: WFL for tasks assessed/performed  Overall Cognitive Status: Within Functional Limits for tasks assessed    Physical Exam:  Blood pressure 144/84, pulse 64, temperature 98.6 F (37 C), temperature source Oral, resp. rate 18,  height 5' 3.6" (1.615 m), weight 102.2 kg (225 lb 5 oz), last menstrual period 03/17/2012, SpO2 96.00%.   Constitutional: She is oriented to person, place, and time. She appears well-developed and well-nourished. obese HENT:  Head: Normocephalic and atraumatic.  Eyes: Conjunctivae are normal. Pupils are equal, round, and reactive to light.  Neck: Normal range of motion. Neck supple.  Cardiovascular: Normal rate and regular rhythm.  No murmur heard.  Pulmonary/Chest: Effort normal and breath sounds normal. No respiratory distress.  Abdominal: Soft. Bowel sounds are normal. She exhibits no distension. There is no tenderness.  Neurological: She is alert and oriented to person, place, and time.  Decreased visual acuity, complains of diplopia with tracking but no obvious asymmetries in gaze were seen. . She exhibits normal muscle tone. RUE is 4/5 deltoid, biceps, triceps, 4 to 4+ HI. LUE is 4+ to 5/5 in deltoid, biceps, triceps, HI. RLE is  1-2/5 HF, KE and 1+ ankle. LLE is 2+ to 3/5 HF, KE and 1+ to 2/5 left ankle. Decreased pp/lt both legs to thigh and bilateral hands to wrist.  Cognitively she displayed reasonable insight and awareness. Attention was good. She is quite alert.   Cerebellar testing finger-nose-finger normal in the upper and lower limbs  Skin: Skin is warm and dry. No erythema.   Results for orders placed during the hospital encounter of 08/01/13 (from the past 48 hour(s))   GLUCOSE, CAPILLARY Status: Abnormal    Collection Time    08/03/13 12:13 PM   Result  Value  Range    Glucose-Capillary  180 (*)  70 - 99 mg/dL   GLUCOSE, CAPILLARY Status: Abnormal    Collection Time    08/03/13 4:37 PM   Result  Value  Range    Glucose-Capillary  159 (*)  70 - 99 mg/dL   GLUCOSE, CAPILLARY Status: Abnormal    Collection Time    08/03/13 7:37 PM   Result  Value  Range    Glucose-Capillary  178 (*)  70 - 99 mg/dL   GLUCOSE, CAPILLARY Status: Abnormal    Collection Time    08/03/13  9:34 PM   Result  Value  Range    Glucose-Capillary  170 (*)  70 - 99 mg/dL   GLUCOSE, CAPILLARY Status: Abnormal    Collection Time    08/04/13 6:18 AM   Result  Value  Range    Glucose-Capillary  167 (*)  70 - 99 mg/dL    Comment 1  Notify RN    GLUCOSE, CAPILLARY Status: Abnormal    Collection Time    08/04/13 7:37 AM   Result  Value  Range    Glucose-Capillary  168 (*)  70 - 99 mg/dL   GLUCOSE, CAPILLARY Status: Abnormal    Collection Time    08/04/13 11:56 AM   Result  Value  Range    Glucose-Capillary  129 (*)  70 - 99 mg/dL   GLUCOSE, CAPILLARY Status: Abnormal    Collection Time    08/04/13 4:59 PM   Result  Value  Range    Glucose-Capillary  158 (*)  70 - 99 mg/dL   GLUCOSE, CAPILLARY Status: Abnormal    Collection Time    08/04/13 9:17 PM   Result  Value  Range    Glucose-Capillary  203 (*)  70 - 99 mg/dL    Comment 1  Notify RN     Comment 2  Documented in Chart    GLUCOSE, CAPILLARY Status: Abnormal    Collection Time    08/05/13 8:05 AM   Result  Value  Range    Glucose-Capillary  134 (*)  70 - 99 mg/dL    No results found.  Post Admission Physician Evaluation:  1. Functional deficits secondary to MS exacerbation. 2. Patient is admitted to receive collaborative, interdisciplinary care between the physiatrist, rehab nursing staff, and therapy team. 3. Patient's level of medical complexity and substantial therapy needs in context of that medical necessity cannot be provided at a lesser intensity of care such as a SNF. 4. Patient has experienced substantial functional loss from his/her baseline which was documented above under the "Functional History" and "Functional Status" headings. Judging by the patient's diagnosis, physical exam, and functional history, the patient has potential for functional progress which will result in measurable gains while on inpatient rehab. These gains will be of substantial and practical use upon discharge in facilitating  mobility and  self-care at the household level. 5. Physiatrist will provide 24 hour management of medical needs as well as oversight of the therapy plan/treatment and provide guidance as appropriate regarding the interaction of the two. 6. 24 hour rehab nursing will assist with bladder management, bowel management, safety, skin/wound care, disease management, medication administration, pain management and patient education and help integrate therapy concepts, techniques,education, etc. 7. PT will assess and treat for/with: Lower extremity strength, range of motion, stamina, balance, functional mobility, safety, adaptive techniques and equipment, NMR, pain mgt, education. Goals are: supervision to minimal assist. 8. OT will assess and treat for/with: ADL's, functional mobility, safety, upper extremity strength, adaptive techniques and equipment, NMR, pain mgt, education. Goals are: supervision to minimal assist. 9. SLP will assess and treat for/with: higher level cognition, memory. Goals are: mod I. 10. Case Management and Social Worker will assess and treat for psychological issues and discharge planning. 11. Team conference will be held weekly to assess progress toward goals and to determine barriers to discharge. 12. Patient will receive at least 3 hours of therapy per day at least 5 days per week. 13. ELOS: 2-3 weeks  14. Prognosis: good. She is quite motivated   Medical Problem List and Plan:  1. DVT Prophylaxis/Anticoagulation: Pharmaceutical: Lovenox  2. Pain Management: Continue prn tramadol for pain.  3. Mood: Provide ego support. LCSW to follow for evaluation.  4. Neuropsych: This patient is capable of making decisions on his own behalf.  5. DM type 2: Monitor with AC/HS cbg checks. Titrate metformin to 1000 mg bid. Will continue meal coverage for now. Expect BS to run high due to recent IV steroids.  6. Polyneuropathy: Will continue neurontin (in place of lyrica)--she feels that this is working  better. Titrate as needed 7. Insomnia: Will try trazodone prn.  8. Thrush-nystatin suspension  Ranelle Oyster, MD, [facility] Physical Medicine & Rehabilitation    08/05/2013

## 2013-08-05 NOTE — Progress Notes (Signed)
Physical Therapy Treatment Patient Details Name: [name] MRN: [id number] DOB: [birth year] Today's Date: 08/05/2013 Time: 3664-4034 PT Time Calculation (min): 38 min  PT Assessment / Plan / Recommendation  History of Present Illness Pt with MS exacerbation.   PT Comments   Continuing progress with mobility, ambulation, and activity tolerance  Follow Up Recommendations  CIR     Does the patient have the potential to tolerate intense rehabilitation     Barriers to Discharge        Equipment Recommendations  None recommended by PT    Recommendations for Other Services    Frequency Min 4X/week   Progress towards PT Goals Progress towards PT goals: Progressing toward goals  Plan Current plan remains appropriate    Precautions / Restrictions Precautions Precautions: Fall Precaution Comments: LE's "give way" per patient. Restrictions Weight Bearing Restrictions: No   Pertinent Vitals/Pain Reported headache coming on; Pain, especially in RLE at end of session patient repositioned for comfort     Mobility  Bed Mobility Bed Mobility: Supine to Sit;Sitting - Scoot to Edge of Bed Supine to Sit: HOB elevated;With rails;5: Supervision Sitting - Scoot to Edge of Bed: With rail;5: Supervision Details for Bed Mobility Assistance: Noted uses UEs to help LEs off of bed Transfers Transfers: Sit to Stand;Stand to Sit Sit to Stand: With upper extremity assist;5: Supervision Stand to Sit: With upper extremity assist;To chair/3-in-1;4: Min guard Details for Transfer Assistance: verbal cues for hand placement and safety; Minguard for safety sitting when pt is fatigued Ambulation/Gait Ambulation/Gait Assistance: 3: Mod assist (For safety with increasing distance) Ambulation/Gait: Patient Percentage:  (80% decreasing to 70% with fatigue) Ambulation Distance (Feet): 25 Feet (15, seated rest break, ten) Assistive device: Rolling walker Ambulation/Gait Assistance Details: Cues for gait  sequence and occasional physical assist for RLE advancement; Cues to self-monitor; adjusted height of RWfor optimal fit/energy conservation of UEs Gait Pattern: Decreased step length - left;Trunk flexed;Decreased step length - right;Left foot flat;Right foot flat;Wide base of support Gait velocity: very slow General Gait Details: pt with minimized swing through phase of gait with both legs... she essentially drags legs though with c/o pain, especially in right leg and calf    Exercises     PT Diagnosis:    PT Problem List:   PT Treatment Interventions:     PT Goals (current goals can now be found in the care plan section) Acute Rehab PT Goals Patient Stated Goal: To be able to wash my own hair, get up the stairs. PT Goal Formulation: With patient Time For Goal Achievement: 08/16/13 Potential to Achieve Goals: Good  Visit Information  Last PT Received On: 08/05/13 Assistance Needed: +2 (for safety/chair) PT/OT Co-Evaluation/Treatment: Yes History of Present Illness: Pt with MS exacerbation.    Subjective Data  Subjective: Very motivated Patient Stated Goal: To be able to wash my own hair, get up the stairs.   Cognition  Cognition Arousal/Alertness: Awake/alert Behavior During Therapy: WFL for tasks assessed/performed Overall Cognitive Status: Impaired/Different from baseline Area of Impairment: Memory Memory: Decreased short-term memory General Comments: Pt is aware of short-term memory deficits, and appropriately journals    Balance  Balance Balance Assessed: Yes Static Standing Balance Static Standing - Balance Support: Left upper extremity supported;During functional activity Static Standing - Level of Assistance: 5: Stand by assistance Static Standing - Comment/# of Minutes: 4  End of Session PT - End of Session Equipment Utilized During Treatment: Gait belt Activity Tolerance: Patient tolerated treatment well;Patient limited by fatigue Patient  left: in chair;with  call bell/phone within reach Nurse Communication: Mobility status   GP     [facility] [name], [name] [phone]  08/05/2013, 11:01 AM

## 2013-08-05 NOTE — Discharge Summary (Signed)
Physician Discharge Summary  [name] ZOX:[id number] DOB: July 17, 1976 DOA: 08/01/2013  PCP: Thomos Lemons, DO  Admit date: 08/01/2013 Discharge date: 08/05/2013  Time spent: 50 minutes  Recommendations for Outpatient Follow-up:  Being discharged to [facility] for Intensive Physical Therapy Please check a bmet in 1 week.  Patient recently started on Lisinopril. Follow up with Neurology when discharged from CIR. Patient interested in seeing a different neurologist (Previously Dr. Anne Hahn)  Discharge Diagnoses:  Principal Problem:   Exacerbation of multiple sclerosis Active Problems:   DIABETES MELLITUS, TYPE II   HYPERLIPIDEMIA   OBESITY, MORBID   HYPERTENSION   ASTHMA   GERD   Discharge Condition: improved.  Still with some headache  Diet recommendation: heart healthy  Filed Weights   08/01/13 1548 08/01/13 1549 08/01/13 2100  Weight: 102.059 kg (225 lb) 102.059 kg (225 lb) 102.2 kg (225 lb 5 oz)    History of present illness:  [name] is a 37 y.o. female With a history of Multiple Sclerosis Diagnosed in 2010 on Rebif therapy who presents to the ED with complaints of worsening of weakness and numbness in her neck arms and back for over 1 month. She reports having burning pain and heaviness as well a numbness in her arms and legs. She was placed on a oral prednisone taper 2 weeks ago but Did not improve. Her PCP was in the process of referring her to her Neurologist Dr. Anne Hahn for an evaluation. She came to the ED due to worsening and no relief with her Vicodin therapy.   Hospital Course:  Exacerbation of multiple sclerosis:   Neurology was consulted   She received IV solumedrol.  This was discontinued on 9/2  On Rebif  Monday Wednesday Friday  Patient reports flulike symptoms after the rebif dose which is normal for her and last about 24 hours   Still has weakness in right arm and leg - but feels overall her symptoms have improved.   DIABETES MELLITUS, TYPE  II  Hemoglobin A1c 6.2   Was treated with insulin as an inpatient   Will resume metformin   OBESITY, MORBID  Counseled patient on diet and weight control   HYPERTENSION:  Started on lisinopril at discharge.  Please check a bmet in 1 week.  ASTHMA: Stable   GERD: Continue PPI  Consultations:  Neurology  Discharge Exam: Filed Vitals:   08/05/13 0716  BP: 144/84  Pulse:   Temp:   Resp:     General: WD, Overweight, Extremely pleasant female, NAD, Slight tremor Cardiovascular: rrr no m/r/g Respiratory: CTA no w/c/r Abdomen:  Obese, soft, nt, nd, +BS Extremities 5/5 strength in each, but noticeable weaker on exam on the right side.  Discharge Instructions      Discharge Orders   Future Appointments Provider Department Dept Phone         Future Orders Complete By Expires   Diet - low sodium heart healthy  As directed    Increase activity slowly  As directed        Medication List         acetaminophen 325 MG tablet  Commonly known as:  TYLENOL  Take 2 tablets (650 mg total) by mouth every 6 (six) hours as needed.     baclofen 5 mg Tabs tablet  Commonly known as:  LIORESAL  Take 0.5 tablets (5 mg total) by mouth 3 (three) times daily.     diphenhydrAMINE 25 mg capsule  Commonly known as:  BENADRYL  Take 1 capsule (25 mg total) by mouth every 6 (six) hours as needed for itching.     lisinopril 10 MG tablet  Commonly known as:  PRINIVIL,ZESTRIL  Take 1 tablet (10 mg total) by mouth daily.     metFORMIN 500 MG tablet  Commonly known as:  GLUCOPHAGE  Take 500 mg by mouth 3 (three) times daily.     ondansetron 4 MG tablet  Commonly known as:  ZOFRAN  Take 1 tablet (4 mg total) by mouth every 6 (six) hours as needed for nausea.     pregabalin 100 MG capsule  Commonly known as:  LYRICA  Take 100 mg by mouth 3 (three) times daily.     REBIF REBIDOSE   Inject 8.8 mcg into the skin 3 (three) times a week.     traMADol 50 MG tablet  Commonly known  as:  ULTRAM  Take 2 tablets (100 mg total) by mouth every 6 (six) hours as needed.       No Known Allergies Follow-up Information   Follow up with Thomos Lemons, DO. Schedule an appointment as soon as possible for a visit in 1 month.   Specialty:  Internal Medicine   Contact information:   [address] [place] [address] [phone]      The results of significant diagnostics from this hospitalization (including imaging, microbiology, ancillary and laboratory) are listed below for reference.    Significant Diagnostic Studies: Dg Lumbar Spine Complete  08/01/2013   *RADIOLOGY REPORT*  Clinical Data: Burning sensation and throughout the spine. Recurrent falls and incontinence for one month.  LUMBAR SPINE - COMPLETE 4+ VIEW  Comparison: MRI lumbar spine 06/20/2009.  Findings: Vertebral body height and alignment are normal.  No pars interarticularis defect is identified.  Intervertebral disc space height is maintained.  Paraspinous structures demonstrate a lap band which is partially visualized.  Cholecystectomy clips are also noted.  IMPRESSION: Normal appearing lumbar spine.   Original Report Authenticated By: Holley Dexter, M.D.   Mr [name] OZ Contrast  07/23/2013   *RADIOLOGY REPORT*  Clinical Data:  Multiple sclerosis.  MRI HEAD WITHOUT AND WITH CONTRAST MRI CERVICAL SPINE WITHOUT AND WITH CONTRAST  Technique:  Multiplanar, multiecho pulse sequences of the brain and surrounding structures, and cervical spine, to include the craniocervical junction and cervicothoracic junction, were obtained without and with intravenous contrast.  Contrast: 20mL MULTIHANCE GADOBENATE DIMEGLUMINE 529 MG/ML IV SOLN  Comparison:   Prior MRI from 06/19/2013.  MRI HEAD  Findings: Scattered foci of periventricular T2 / FLAIR signal are seen within the periventricular white matter, not significantly changed as compared to the prior exam. Lesion within the right thalamus is also stable.  These findings  are most consistent with an underlying demyelinating process.  No new lesions are identified.  No enhancing lesions are seen on post contrast sequences.  There is no diffusion weighted signal abnormality to suggest acute intracranial hemorrhage or infarct.  IMPRESSION:  Stable appearance of the brain with multiple T2 / FLAIR hyperintense lesions involving the periventricular white matter and right thalamus, consistent with multiple sclerosis. No new lesions or enhancing lesions identified.  MRI CERVICAL SPINE  Findings: The study is somewhat technically degraded by motion artifact.  The vertebral bodies are normally aligned with preservation of the normal cervical lordosis.  Signal intensity within the vertebral body bone marrow is normal.  The craniocervical junction is within normal limits.  The visualized posterior fossa is normal.  Signal intensity within the  cervical spinal cord is within normal limits without definite evidence of demyelinating process. Heterogeneous T2 signal intensity seen on axial T2 sequences likely artifactual due to body habitus and motion, and is not seen on the corresponding sagittal sequences.  No enhancing lesions identified.  At C2-3, there is no significant canal or neural foraminal stenosis.  At C3-4, there is no significant canal or neural foraminal stenosis.  At C4-5, mild degenerative disc bulges present without significant canal or foraminal stenosis.  At C5-6, degenerative disc disease with degenerative disc osteophyte and mild canal stenosis is again seen, not significantly changed.  At C6-7, there is minimal degenerative disc disease, also stable. No significant canal or foraminal stenosis.  C7-T1, no canal or foraminal stenosis.  IMPRESSION: 1.  Stable appearance of the cervical spine with no definite spinal cord signal abnormality to suggest underlying demyelinating disease. No enhancing lesions.  2.  Stable appearance of degenerative disc disease at C5-6 with mild canal  and bilateral foraminal stenosis.   Original Report Authenticated By: Rise Mu, M.D.   Mr Cervical Spine W Wo Contrast  07/23/2013   *RADIOLOGY REPORT*  Clinical Data:  Multiple sclerosis.  MRI HEAD WITHOUT AND WITH CONTRAST MRI CERVICAL SPINE WITHOUT AND WITH CONTRAST  Technique:  Multiplanar, multiecho pulse sequences of the brain and surrounding structures, and cervical spine, to include the craniocervical junction and cervicothoracic junction, were obtained without and with intravenous contrast.  Contrast: 20mL MULTIHANCE GADOBENATE DIMEGLUMINE 529 MG/ML IV SOLN  Comparison:   Prior MRI from 06/19/2013.  MRI HEAD  Findings: Scattered foci of periventricular T2 / FLAIR signal are seen within the periventricular white matter, not significantly changed as compared to the prior exam. Lesion within the right thalamus is also stable.  These findings are most consistent with an underlying demyelinating process.  No new lesions are identified.  No enhancing lesions are seen on post contrast sequences.  There is no diffusion weighted signal abnormality to suggest acute intracranial hemorrhage or infarct.  IMPRESSION:  Stable appearance of the brain with multiple T2 / FLAIR hyperintense lesions involving the periventricular white matter and right thalamus, consistent with multiple sclerosis. No new lesions or enhancing lesions identified.  MRI CERVICAL SPINE  Findings: The study is somewhat technically degraded by motion artifact.  The vertebral bodies are normally aligned with preservation of the normal cervical lordosis.  Signal intensity within the vertebral body bone marrow is normal.  The craniocervical junction is within normal limits.  The visualized posterior fossa is normal.  Signal intensity within the cervical spinal cord is within normal limits without definite evidence of demyelinating process. Heterogeneous T2 signal intensity seen on axial T2 sequences likely artifactual due to body habitus  and motion, and is not seen on the corresponding sagittal sequences.  No enhancing lesions identified.  At C2-3, there is no significant canal or neural foraminal stenosis.  At C3-4, there is no significant canal or neural foraminal stenosis.  At C4-5, mild degenerative disc bulges present without significant canal or foraminal stenosis.  At C5-6, degenerative disc disease with degenerative disc osteophyte and mild canal stenosis is again seen, not significantly changed.  At C6-7, there is minimal degenerative disc disease, also stable. No significant canal or foraminal stenosis.  C7-T1, no canal or foraminal stenosis.  IMPRESSION: 1.  Stable appearance of the cervical spine with no definite spinal cord signal abnormality to suggest underlying demyelinating disease. No enhancing lesions.  2.  Stable appearance of degenerative disc disease at C5-6 with mild  canal and bilateral foraminal stenosis.   Original Report Authenticated By: Rise Mu, M.D.    Microbiology: Recent Results (from the past 240 hour(s))  URINE CULTURE     Status: None   Collection Time    08/01/13  6:27 PM      Result Value Range Status   Specimen Description URINE, CLEAN CATCH   Final   Special Requests NONE   Final   Culture  Setup Time     Final   Value: 08/01/2013 22:49     Performed at [facility] Count     Final   Value: 10,000 COLONIES/ML     Performed at [facility]   Culture     Final   Value: Multiple bacterial morphotypes present, none predominant. Suggest appropriate recollection if clinically indicated.     Performed at [facility]   Report Status 08/02/2013 FINAL   Final     Labs: Basic Metabolic Panel:  Recent Labs Lab 08/01/13 1806 08/02/13 0516  NA 138 136  K 3.7 3.5  CL 101 102  CO2 26 22  GLUCOSE 109* 188*  BUN 8 7  CREATININE 0.48* 0.51  CALCIUM 9.2 9.0   CBC:  Recent Labs Lab 08/01/13 1806 08/02/13 0516  WBC 7.6 5.6  HGB 14.2 14.0  HCT  39.2 39.4  MCV 88.1 88.5  PLT 316 297   CBG:  Recent Labs Lab 08/04/13 0737 08/04/13 1156 08/04/13 1659 08/04/13 2117 08/05/13 0805  GLUCAP 168* 129* 158* 203* 134*     Signed:  Conley Canal [phone]  [facility] 08/05/2013, 10:18 AM  Patient interviewed and examined.  Agree with above.  Crista Curb, M.D. [phone]

## 2013-08-05 NOTE — Progress Notes (Signed)
Occupational Therapy Treatment Patient Details Name: [name] MRN: [id number] DOB: Jul 05, 1976 Today's Date: 08/05/2013 Time: 7846-9629 OT Time Calculation (min): 38 min  OT Assessment / Plan / Recommendation  History of present illness Pt with MS exacerbation.   OT comments  Pt continues to be highly motivated.  States her arms don't feel "tethered" anymore.  Now able to donn her own socks and stand for brief periods for grooming with chair behind for safety.  Educated pt in energy conservation and pacing during ADL and mobility. Pain and fatigue continue to be limiting.  Follow Up Recommendations  CIR    Barriers to Discharge       Equipment Recommendations  Tub/shower bench    Recommendations for Other Services    Frequency Min 3X/week   Progress towards OT Goals Progress towards OT goals: Progressing toward goals  Plan Discharge plan remains appropriate    Precautions / Restrictions Precautions Precautions: Fall Restrictions Weight Bearing Restrictions: No   Pertinent Vitals/Pain "all over" after activity, premedicated, repositioned    ADL  Eating/Feeding: Independent Where Assessed - Eating/Feeding: Chair Grooming: Wash/dry hands;Teeth care;Min guard Where Assessed - Grooming: Supported standing;Unsupported sitting Upper Body Dressing: Set up Where Assessed - Upper Body Dressing: Unsupported sitting Lower Body Dressing: Set up Where Assessed - Lower Body Dressing: Unsupported sitting Toilet Transfer: Min guard Toilet Transfer Method: Stand pivot Acupuncturist: Extra wide bedside commode Toileting - Clothing Manipulation and Hygiene: Supervision/safety Where Assessed - Engineer, mining and Hygiene: Sit on 3-in-1 or toilet Equipment Used: Gait belt;Rolling walker Transfers/Ambulation Related to ADLs: ambulated with PT/OT with min assist, chair following ADL Comments: Pt stood x 4 minutes to brush teeth with chair behind, completed  washing hands at sink in sitting. Pt sat in bed to donn socks. Tremors in hands noted with fatigue.  Focus on pt monitoring her own fatigue level and pain and request rest breaks.      OT Diagnosis:    OT Problem List:   OT Treatment Interventions:     OT Goals(current goals can now be found in the care plan section) Acute Rehab OT Goals Patient Stated Goal: To be able to wash my own hair, get up the stairs.  Visit Information  Last OT Received On: 08/05/13 Assistance Needed: +2 (for safety/chair) PT/OT Co-Evaluation/Treatment: Yes History of Present Illness: Pt with MS exacerbation.    Subjective Data      Prior Functioning       Cognition  Cognition Arousal/Alertness: Awake/alert Behavior During Therapy: WFL for tasks assessed/performed Overall Cognitive Status: Impaired/Different from baseline Area of Impairment: Memory Memory: Decreased short-term memory    Mobility  Bed Mobility Bed Mobility: Supine to Sit;Sitting - Scoot to Edge of Bed Supine to Sit: HOB elevated;With rails;5: Supervision Sitting - Scoot to Edge of Bed: With rail;5: Supervision Transfers Transfers: Sit to Stand;Stand to Sit Sit to Stand: With upper extremity assist;5: Supervision Stand to Sit: 5: Supervision;With upper extremity assist;To chair/3-in-1 Details for Transfer Assistance: verbal cues for hand placement    Exercises      Balance Balance Balance Assessed: Yes Static Standing Balance Static Standing - Balance Support: Left upper extremity supported;During functional activity Static Standing - Level of Assistance: 5: Stand by assistance Static Standing - Comment/# of Minutes: 4   End of Session OT - End of Session Activity Tolerance: Patient limited by fatigue;Patient limited by pain Patient left: in chair;with call bell/phone within reach  GO     Evern Bio 08/05/2013,  10:41 AM [phone]

## 2013-08-05 NOTE — Telephone Encounter (Signed)
No answer

## 2013-08-05 NOTE — Progress Notes (Signed)
Bed available CIR-can admit CIR today if medically ready.  [phone]

## 2013-08-06 ENCOUNTER — Inpatient Hospital Stay (HOSPITAL_COMMUNITY): Payer: No Typology Code available for payment source

## 2013-08-06 ENCOUNTER — Ambulatory Visit: Payer: No Typology Code available for payment source | Admitting: Internal Medicine

## 2013-08-06 DIAGNOSIS — G35 Multiple sclerosis: Secondary | ICD-10-CM

## 2013-08-06 LAB — CBC WITH DIFFERENTIAL/PLATELET
Basophils Absolute: 0 10*3/uL (ref 0.0–0.1)
Basophils Relative: 0 % (ref 0–1)
Eosinophils Absolute: 0 10*3/uL (ref 0.0–0.7)
Eosinophils Relative: 0 % (ref 0–5)
HCT: 38 % (ref 36.0–46.0)
Hemoglobin: 13.3 g/dL (ref 12.0–15.0)
Lymphocytes Relative: 48 % — ABNORMAL HIGH (ref 12–46)
Lymphs Abs: 5.2 10*3/uL — ABNORMAL HIGH (ref 0.7–4.0)
MCH: 31 pg (ref 26.0–34.0)
MCHC: 35 g/dL (ref 30.0–36.0)
MCV: 88.6 fL (ref 78.0–100.0)
Monocytes Absolute: 0.9 10*3/uL (ref 0.1–1.0)
Monocytes Relative: 8 % (ref 3–12)
Neutro Abs: 4.7 10*3/uL (ref 1.7–7.7)
Neutrophils Relative %: 44 % (ref 43–77)
Platelets: 290 10*3/uL (ref 150–400)
RBC: 4.29 MIL/uL (ref 3.87–5.11)
RDW: 12.7 % (ref 11.5–15.5)
WBC: 10.9 10*3/uL — ABNORMAL HIGH (ref 4.0–10.5)

## 2013-08-06 LAB — GLUCOSE, CAPILLARY
Glucose-Capillary: 91 mg/dL (ref 70–99)
Glucose-Capillary: 94 mg/dL (ref 70–99)
Glucose-Capillary: 109 mg/dL — ABNORMAL HIGH (ref 70–99)
Glucose-Capillary: 102 mg/dL — ABNORMAL HIGH (ref 70–99)

## 2013-08-06 LAB — COMPREHENSIVE METABOLIC PANEL
ALT: 117 U/L — ABNORMAL HIGH (ref 0–35)
AST: 61 U/L — ABNORMAL HIGH (ref 0–37)
Albumin: 3.2 g/dL — ABNORMAL LOW (ref 3.5–5.2)
Alkaline Phosphatase: 46 U/L (ref 39–117)
BUN: 9 mg/dL (ref 6–23)
CO2: 31 mEq/L (ref 19–32)
Calcium: 8.6 mg/dL (ref 8.4–10.5)
Chloride: 99 mEq/L (ref 96–112)
Creatinine, Ser: 0.53 mg/dL (ref 0.50–1.10)
GFR calc Af Amer: >90 mL/min (ref >90)
GFR calc non Af Amer: >90 mL/min (ref >90)
Glucose, Bld: 96 mg/dL (ref 70–99)
Potassium: 3 mEq/L — ABNORMAL LOW (ref 3.5–5.1)
Sodium: 139 mEq/L (ref 135–145)
Total Bilirubin: 0.4 mg/dL (ref 0.3–1.2)
Total Protein: 6.2 g/dL (ref 6.0–8.3)

## 2013-08-06 MED ORDER — POTASSIUM CHLORIDE CRYS ER 20 MEQ PO TBCR
20.0000 meq | EXTENDED_RELEASE_TABLET | Freq: Two times a day (BID) | ORAL | Status: DC
  Administered 2013-08-06 – 2013-08-18 (×24): 20 meq via ORAL
  Filled 2013-08-06 (×27): qty 1

## 2013-08-06 MED ORDER — METFORMIN HCL 500 MG PO TABS
500.0000 mg | ORAL_TABLET | Freq: Two times a day (BID) | ORAL | Status: DC
  Administered 2013-08-06 – 2013-08-16 (×21): 500 mg via ORAL
  Filled 2013-08-06 (×24): qty 1

## 2013-08-06 MED ORDER — SENNOSIDES-DOCUSATE SODIUM 8.6-50 MG PO TABS
1.0000 | ORAL_TABLET | Freq: Every day | ORAL | Status: DC
  Administered 2013-08-06 – 2013-08-10 (×3): 1 via ORAL
  Filled 2013-08-06 (×4): qty 1

## 2013-08-06 MED ORDER — ENSURE COMPLETE PO LIQD
237.0000 mL | Freq: Two times a day (BID) | ORAL | Status: DC
  Administered 2013-08-11: 237 mL via ORAL

## 2013-08-06 NOTE — Evaluation (Signed)
Speech Language Pathology Assessment and Plan  Patient Details  Name: [name] MRN: [id number] Date of Birth: [birth year]  SLP Diagnosis: Cognitive Impairments  Rehab Potential: Excellent ELOS: 10-14 days    Today's Date: 08/06/2013 Time: 4098-1191 Time Calculation (min): 40 min  Skilled Therapeutic Intervention: Administered cognitive-linguistic evaluation. Please see below for details. Patient educated on current cognitive impairments and goals of skilled SLP intervention. Pt verbalized understanding.   Problem List:  Patient Active Problem List   Diagnosis Date Noted  . Exacerbation of multiple sclerosis 08/01/2013  . Multiple sclerosis exacerbation 06/30/2013  . Preoperative examination 02/01/2012  . MIGRAINE HEADACHE 12/15/2010  . SINUSITIS 01/19/2010  . BRONCHITIS 01/12/2010  . Multiple sclerosis 06/16/2009  . ASTHMA, WITH ACUTE EXACERBATION 03/23/2009  . ALLERGIC RHINITIS 02/07/2009  . URINARY FREQUENCY 12/23/2008  . ASTHMATIC BRONCHITIS, ACUTE 12/17/2008  . PARESTHESIA 11/24/2008  . ACUT SUPPRATV OTITIS MEDIA W/O SPONT RUP EARDRUM 09/17/2008  . EPICONDYLITIS 09/02/2008  . SNORING 03/29/2008  . FATIGUE 03/02/2008  . 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 OBESITY, MORBID 02/23/2008  . ABDOMINAL PAIN, GENERALIZED 02/23/2008  . FATTY LIVER DISEASE 01/27/2008  . HYPERLIPIDEMIA 01/19/2008  . LIVER FUNCTION TESTS, ABNORMAL 01/19/2008  . DIABETES MELLITUS, TYPE II 06/27/2007  . HYPERTENSION 06/27/2007  . ASTHMA 06/27/2007  . GERD 06/27/2007   Past Medical History:  Past Medical History  Diagnosis Date  . Asthma   . Diabetes mellitus   . Hypertension   . PCOS (polycystic ovarian syndrome)   . Gastroparesis   . Polyneuropathy   . Multiple sclerosis     Dr. Anne Hahn  . Multiple sclerosis, primary progressive 2010   Past Surgical History:  Past Surgical History  Procedure Laterality Date  . Cholecystectomy    . Dilation and curettage of uterus    . Laparoscopic gastric  banding  01/26/08  . Cesarean section    . Iud removal  03/19/2012    denies  . Colon surgery    . Abdominal hysterectomy      Assessment / Plan / Recommendation Clinical Impression  Patient is a 37 year old female with history of DM, HTN, MS treated with Rebif (M-W-F) who was admitted on 08/01/13 with multiple falls with difficulty walking as well as burning pain/numbness in arms/legs and B/B incontinence. Followed by Dr. Anne Hahn but has not seen him since 2012. [name] Oral steroids ineffective and pt was evaluated by Dr. Thad Ranger and started on IV solumedrol as well as baclofen for spasticity with recommendations to avoid narcotics. Paraesthesias improved but pain control poor. PT evaluation done and MD, PT recommending CIR. Patient transferred to CIR on 08/05/2013 and presents with mild cognitive impairments characterized by decreased complex problem solving, working Printmaker. Pt would benefit from skilled SLP intervention to maximize cognitive function and overall functional independence prior to discharge home.     SLP Assessment  Patient will need skilled Speech Lanaguage Pathology Services during CIR admission    Recommendations  Recommendations for Other Services: Neuropsych consult Patient destination: Home Follow up Recommendations: Outpatient SLP Equipment Recommended: None recommended by SLP    SLP Frequency 5 out of 7 days   SLP Treatment/Interventions Cognitive remediation/compensation;Cueing hierarchy;Environmental controls;Internal/external aids;Patient/family education;Therapeutic Activities;Functional tasks    Pain No/Denies Pain  Short Term Goals: Week 1: SLP Short Term Goal 1 (Week 1): Pt will demonstrate complex problem solving with functional and familiar tasks with supervision verbal cues.  SLP Short Term Goal  2 (Week 1): Pt will utilize external memory aids to recall new,  daily information with Mod I.  SLP Short Term Goal 3 (Week 1): Pt will utilize visual aids/cues to increase organization of functional information with supervision verbal cues.   See FIM for current functional status Refer to Care Plan for Long Term Goals  Recommendations for other services: Neuropsych  Discharge Criteria: Patient will be discharged from SLP if patient refuses treatment 3 consecutive times without medical reason, if treatment goals not met, if there is a change in medical status, if patient makes no progress towards goals or if patient is discharged from hospital.  The above assessment, treatment plan, treatment alternatives and goals were discussed and mutually agreed upon: by patient  [name] 08/06/2013, 3:47 PM

## 2013-08-06 NOTE — Evaluation (Signed)
Physical Therapy Assessment and Plan  Patient Details  Name: [name] MRN: [id number] Date of Birth: [birth year]  PT Diagnosis: Abnormality of gait, Coordination disorder, Difficulty walking, Dizziness and giddiness, Impaired sensation and Muscle weakness Rehab Potential: Good ELOS: 2 weeks  Today's Date: 08/06/2013 Time:Treatment Session 1: 1000-1050; Treatment Session 2: 4098-1191 Time Calculation (min): Treatment Session 1: 50 min; Treatment Session 2:  Problem List:  Patient Active Problem List   Diagnosis Date Noted  . Exacerbation of multiple sclerosis 08/01/2013  . Multiple sclerosis exacerbation 06/30/2013  . Preoperative examination 02/01/2012  . MIGRAINE HEADACHE 12/15/2010  . SINUSITIS 01/19/2010  . BRONCHITIS 01/12/2010  . Multiple sclerosis 06/16/2009  . ASTHMA, WITH ACUTE EXACERBATION 03/23/2009  . ALLERGIC RHINITIS 02/07/2009  . URINARY FREQUENCY 12/23/2008  . ASTHMATIC BRONCHITIS, ACUTE 12/17/2008  . PARESTHESIA 11/24/2008  . ACUT SUPPRATV OTITIS MEDIA W/O SPONT RUP EARDRUM 09/17/2008  . EPICONDYLITIS 09/02/2008  . SNORING 03/29/2008  . FATIGUE 03/02/2008  . 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 OBESITY, MORBID 02/23/2008  . ABDOMINAL PAIN, GENERALIZED 02/23/2008  . FATTY LIVER DISEASE 01/27/2008  . HYPERLIPIDEMIA 01/19/2008  . LIVER FUNCTION TESTS, ABNORMAL 01/19/2008  . DIABETES MELLITUS, TYPE II 06/27/2007  . HYPERTENSION 06/27/2007  . ASTHMA 06/27/2007  . GERD 06/27/2007    Past Medical History:  Past Medical History  Diagnosis Date  . Asthma   . Diabetes mellitus   . Hypertension   . PCOS (polycystic ovarian syndrome)   . Gastroparesis   . Polyneuropathy   . Multiple sclerosis     Dr. Anne Hahn  . Multiple sclerosis, primary progressive 2010   Past Surgical History:  Past Surgical History  Procedure Laterality Date  . Cholecystectomy    . Dilation and curettage of uterus    . Laparoscopic gastric banding  01/26/08  . Cesarean section    . Iud  removal  03/19/2012    denies  . Colon surgery    . Abdominal hysterectomy      Assessment & Plan Clinical Impression: [name] is a 37 y.o. female with history of DM, HTN, MS treated with Rebif (M-W-F) who was admitted on 08/01/13 with multiple falls with difficulty walking as well as burning pain/numbness in arms/legs and B/B incontinence. Followed by Dr. Anne Hahn but has not seen him since 2012. [name] Oral steroids ineffective and She was evaluated by Dr. Thad Ranger and started on IV solumedrol as well as baclofen for spasticity with recommendations to avoid narcotics. Paraesthesias improved but pain control poor. Patient transferred to CIR on 08/05/2013 .   Patient currently requires min to mod A with mobility secondary to muscle weakness, decreased functional endurance, decreased balance, impaired sensation, decreased proprioception, abnormal tone and decreased coordination.  Prior to hospitalization, patient was independent  with mobility, however, req max A overall for month prior to admission and lived with Spouse;Daughter (Husband- Barbara Cower) in a House home.  Home access is 2 curb stepsStairs to enter.  Patient will benefit from skilled PT intervention to maximize safe functional mobility, minimize fall risk and decrease caregiver burden for planned discharge home with 24 hour assist.  Anticipate patient will benefit from follow up [facility] at discharge.  PT - End of Session Endurance Deficit: Yes PT Assessment Rehab Potential: Good Barriers to Discharge: Decreased caregiver support;Inaccessible home environment Barriers to Discharge Comments: Pt reports that mother only able to provide limited physical assist and that husband is actively looking for a more accessible  home  PT Patient demonstrates impairments in the following area(s): Balance;Endurance;Motor;Perception;Safety;Sensory;Pain PT Transfers Functional  Problem(s): Bed Mobility;Bed to Chair;Car;Furniture;Floor PT Locomotion Functional Problem(s): Ambulation;Wheelchair Mobility;Stairs PT Plan PT Intensity: Minimum of 1-2 x/day ,45 to 90 minutes PT Frequency: 5 out of 7 days;Total of 15 hours over 7 days Frequency due to: depending on tolerance 2/2 fatigue PT Duration Estimated Length of Stay: 10-14 days PT Treatment/Interventions: Ambulation/gait training;Balance/vestibular training;Community reintegration;Discharge planning;Disease management/prevention;Functional mobility training;Pain management;Patient/family education;Splinting/orthotics;Therapeutic Activities;UE/LE Coordination activities;Visual/perceptual remediation/compensation;Therapeutic Exercise;Stair training;UE/LE Strength taining/ROM;Wheelchair propulsion/positioning;Psychosocial support;Neuromuscular re-education;DME/adaptive equipment instruction PT Transfers Anticipated Outcome(s): Supervision to [employer] A  PT Locomotion Anticipated Outcome(s): Supervision to Min A, combination of w/c and ambulation PT Recommendation Follow Up Recommendations: Home health PT Patient destination: Home Equipment Details: Pt appears to own all anticipated mobility related DME at this time.   Skilled Therapeutic Intervention Treatment Session 1: 1:1. PT evaluation completed, see detailed objective information below. Upon entry, pt supine in bed and verbalized feeling very fatigued from OT session prior to PT session. Pt A&Ox4 and realistic regarding what functional mobility she could safely demonstrate this tx session due to fatigue. Pt able to perform t/f sup>sit EOB and squat pivot t/f to L side w/ min A. Pt able to t/f sit<>stand x2 w/ RW and min A, however, only able to tolerate standing 10-15sec due to dizziness and fatigue. BP recordings not reflective of orthostatic hypotension. During second bout of standing pt verbalized feeling that her L knee was going to "give way" 2/2 fatigue as it supports  the majority of her weight at this time during standing. Pt did not progress w/ standing functional mobility this tx session 2/2 safety concerns from fatigue and dizziness. Pt requesting to return to bed 2/2 fatigue. Pt req min A again at end of tx session for squat pivot t/f w/c>bed on L side as well as mod A for t/f sit>sup.   Treatment Session 2: 1:1. Pt w/ verbalization of feeling very fatigued at start of tx session, however, agreeable to participate as able. Pt able to propel w/c x6' w/ B UE and min A, demonstrated difficulty due to fatigue and decreased grip strength (R> L). Pt w/ no reports of dizziness during standing this tx session, pt able to amb x5' w/ min A for stability, but max A for advancement of R LE via shuffling. Gait characterized by compensatory weigh shift bilaterally for advancement of contralateral LE, decreased foot clearance on L LE and shuffled step on R LE. Pt able to tolerate NuStep on level 1x80min for focus on B UE/LE strength/endurance. Pt req min A for squat pivot t/f w/c<>toilet w/ use of BSC frame over toilet and mod A for pericare. Pt w/ reports of dizziness while using commode, able to safely return back to supine position in bed w/ min A. Pt appears to demonstrate discrepancy at times between verbalization of level of fatigue isolated assessment of function vs. Actual ability and level of assist needed to safely performed during functional task.  Pt w/ all needs in reach at end of session, bed alarm on, pt's mother and RN in room.   PT Evaluation Precautions/Restrictions Precautions Precautions: Fall Precaution Comments: LE's "give way" per patient. Restrictions Weight Bearing Restrictions: No General Chart Reviewed: Yes Amount of Missed PT Time (min): 10 Minutes Missed Time Reason: Patient fatigue Family/Caregiver Present: No Vital SignsTherapy Vitals Pulse Rate: 80 BP: 120/74 mmHg Patient Position, if appropriate: Standing Oxygen Therapy SpO2: 97 % O2  Device: None (Room air) Pain Pain  Assessment Pain Assessment: 0-10 Pain Score: 0-No pain Home Living/Prior Functioning Home Living Available Help at Discharge: Family;Available 24 hours/day (Pt's mom- Corrie Dandy, initially available 24/7) Type of Home: House Home Access: Stairs to enter [employer] of Steps: 2 curb steps Entrance Stairs-Rails: None Home Layout: Two level;Bed/bath upstairs Alternate Level Stairs-Number of Steps: Flight Alternate Level Stairs-Rails: Right  Lives With: Spouse;Daughter (Husband- Ambulance person) Prior Function Level of Independence: Independent with basic ADLs;Independent with homemaking with ambulation;Independent with transfers;Independent with gait  Able to Take Stairs?: Yes Vocation: On disability Vision/Perception  Vision - History Baseline Vision: Wears glasses all the time Patient Visual Report: Blurring of vision (with fatigue) Perception  Perception: Within Functional Limits Praxis Praxis: Intact  Cognition Arousal/Alertness: Awake/alert Orientation Level: Oriented X4 Attention: Selective Selective Attention: Appears intact Memory: Appears intact Awareness: Appears intact Problem Solving: Appears intact Safety/Judgment: Appears intact Sensation Sensation Light Touch: Impaired Detail Light Touch Impaired Details: Impaired RUE;Impaired RLE;Impaired LLE Proprioception: Impaired by gross assessment (Pt verbalizes having to look at R LE ) Additional Comments: R side hyposensative; N/T noted in B forearms and below knee on R LE Coordination Gross Motor Movements are Fluid and Coordinated: No Coordination and Movement Description: decreased speed and accuracy of R UE/LE Motor  Motor Motor - Skilled Clinical Observations: Decreased strength globally, but R side more impaired than L side; increased tone noted in R LE; Pt reports onset of tremors in L UE/LE when faitgued 2/2 dependence on this side as it is stronger  Mobility Bed Mobility Bed  Mobility: Supine to Sit;Sit to Supine Supine to Sit: HOB flat;4: Min assist Supine to Sit Details: Verbal cues for sequencing Sitting - Scoot to Edge of Bed: 4: Min assist Sitting - Scoot to [place] of Bed Details: Verbal cues for sequencing Sit to Supine: HOB flat;3: Mod assist Sit to Supine - Details: Verbal cues for sequencing Transfers Transfers: Yes Sit to Stand: 4: Min guard;With armrests Stand to Sit: 4: Min guard;With armrests Squat Pivot Transfers: 4: Min Designer, industrial/product Details: Verbal cues for sequencing;Verbal cues for precautions/safety Locomotion  Ambulation Ambulation: No (Unable/unsafe this AM 2/2 fatigue & dizziness w/ standing) Stairs / Additional Locomotion Stairs: No (Unable/unsafe this AM 2/2 fatigue & dizziness w/ standing) Wheelchair Mobility Wheelchair Mobility: No (Unable 2/2 fatigue)  Trunk/Postural Assessment  Postural Control Postural Control: Within Functional Limits  Balance Balance Balance Assessed: Yes Static Sitting Balance Static Sitting - Balance Support: No upper extremity supported;Feet supported Static Sitting - Level of Assistance: 5: Stand by assistance Dynamic Sitting Balance Dynamic Sitting - Balance Support: Feet supported;Left upper extremity supported;Right upper extremity supported Dynamic Sitting - Level of Assistance: 4: Min assist Dynamic Sitting - Balance Activities: Lateral lean/weight shifting;Forward lean/weight shifting;Reaching for objects Dynamic Sitting - Comments: decrease in balance and righting reaction when reaching below knees Static Standing Balance Static Standing - Balance Support: During functional activity;Bilateral upper extremity supported Static Standing - Level of Assistance: 4: Min assist Static Standing - Comment/# of Minutes: Standing endurance limited to 10-15 seconds 2/2 fatigue and dizziness; Pt reports being dependent on L LE during standing and transfers, L knee can "give way" when  fatigued Extremity Assessment  RLE Assessment RLE Assessment: Exceptions to [facility] RLE Strength Right Hip Flexion: 2+/5 Right Knee Flexion: 2+/5 Right Knee Extension: 3/5 Right Ankle Dorsiflexion: 2-/5 Right Ankle Plantar Flexion: 2-/5 LLE Assessment LLE Assessment: Exceptions to [facility] LLE Strength Left Hip Flexion: 3/5 Left Knee Flexion: 3+/5 Left Knee Extension: 3+/5 Left Ankle Dorsiflexion: 3/5 Left  Ankle Plantar Flexion: 3/5  FIM:  FIM - Bed/Chair Transfer Bed/Chair Transfer Assistive Devices: Arm rests;Bed rails Bed/Chair Transfer: 4: Supine > Sit: Min A (steadying Pt. > 75%/lift 1 leg);4: Bed > Chair or W/C: Min A (steadying Pt. > 75%);3: Chair or W/C > Bed: Mod A (lift or lower assist)   Refer to Care Plan for Long Term Goals  Recommendations for other services: Neuropsych  Discharge Criteria: Patient will be discharged from PT if patient refuses treatment 3 consecutive times without medical reason, if treatment goals not met, if there is a change in medical status, if patient makes no progress towards goals or if patient is discharged from hospital.  The above assessment, treatment plan, treatment alternatives and goals were discussed and mutually agreed upon: by patient  Denzil Hughes 08/06/2013, 11:21 AM

## 2013-08-06 NOTE — Evaluation (Signed)
Occupational Therapy Assessment and Plan  Patient Details  Name: [name] MRN: [id number] Date of Birth: 1976/05/20  OT Diagnosis: acute pain and muscle weakness (generalized) Rehab Potential: Rehab Potential: Good ELOS: 10-14 days   Today's Date: 08/06/2013 Time: 0900-1000 Time Calculation (min): 60 min  Problem List:  Patient Active Problem List   Diagnosis Date Noted  . Exacerbation of multiple sclerosis 08/01/2013  . Multiple sclerosis exacerbation 06/30/2013  . Preoperative examination 02/01/2012  . MIGRAINE HEADACHE 12/15/2010  . SINUSITIS 01/19/2010  . BRONCHITIS 01/12/2010  . Multiple sclerosis 06/16/2009  . ASTHMA, WITH ACUTE EXACERBATION 03/23/2009  . ALLERGIC RHINITIS 02/07/2009  . URINARY FREQUENCY 12/23/2008  . ASTHMATIC BRONCHITIS, ACUTE 12/17/2008  . PARESTHESIA 11/24/2008  . ACUT SUPPRATV OTITIS MEDIA W/O SPONT RUP EARDRUM 09/17/2008  . EPICONDYLITIS 09/02/2008  . SNORING 03/29/2008  . FATIGUE 03/02/2008  . 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 OBESITY, MORBID 02/23/2008  . ABDOMINAL PAIN, GENERALIZED 02/23/2008  . FATTY LIVER DISEASE 01/27/2008  . HYPERLIPIDEMIA 01/19/2008  . LIVER FUNCTION TESTS, ABNORMAL 01/19/2008  . DIABETES MELLITUS, TYPE II 06/27/2007  . HYPERTENSION 06/27/2007  . ASTHMA 06/27/2007  . GERD 06/27/2007    Past Medical History:  Past Medical History  Diagnosis Date  . Asthma   . Diabetes mellitus   . Hypertension   . PCOS (polycystic ovarian syndrome)   . Gastroparesis   . Polyneuropathy   . Multiple sclerosis     Dr. Anne Hahn  . Multiple sclerosis, primary progressive 2010   Past Surgical History:  Past Surgical History  Procedure Laterality Date  . Cholecystectomy    . Dilation and curettage of uterus    . Laparoscopic gastric banding  01/26/08  . Cesarean section    . Iud removal  03/19/2012    denies  . Colon surgery    . Abdominal hysterectomy      Assessment & Plan Clinical Impression: Patient is a 37 y.o. year old female  with history of DM, HTN, MS treated with Rebif (M-W-F) who was admitted on 08/01/13 with multiple falls with difficulty walking as well as burning pain/numbness in arms/legs and B/B incontinence. Followed by Dr. Anne Hahn but has not seen him since 2012. [name] Oral steroids ineffective and She was evaluated by Dr. Thad Ranger and started on IV solumedrol as well as baclofen for spasticity with recommendations to avoid narcotics. Paraesthesias improved but pain control poor. PT evaluation done and MD, PT recommending CIR Patient transferred to CIR on 08/05/2013 .    Patient currently requires mod assist with basic self-care skills secondary to muscle weakness and unbalanced muscle activation and decreased coordination.  Prior to hospitalization, patient could complete with BADL with max A from husband although prior to 06/2013, independent with BADL/iADL.  Patient will benefit from skilled intervention to decrease level of assist with basic self-care skills and increase level of independence with iADL prior to discharge home with care partner.  Anticipate patient will require minimal physical assistance and follow up home health.  OT - End of Session Activity Tolerance: Tolerates 10 - 20 min activity with multiple rests Endurance Deficit: Yes Endurance Deficit Description: R/L-LE weakness, right UE tremors, increased confusion and emotional lability with fatigue OT Assessment Rehab Potential: Good Barriers to Discharge: Decreased caregiver support;Inaccessible home environment OT Patient demonstrates impairments in the following area(s): Balance;Endurance;Motor;Pain OT Basic ADL's Functional Problem(s): Eating;Grooming;Bathing;Dressing;Toileting OT Advanced ADL's Functional Problem(s): Simple Meal Preparation;Laundry;Light Housekeeping OT Transfers Functional Problem(s): Toilet;Tub/Shower  OT Additional Impairment(s): Fuctional  Use of Upper Extremity OT Plan OT Intensity: Minimum of 1-2 x/day, 45 to 90 minutes OT Frequency: 5 out of 7 days OT Duration/Estimated Length of Stay: 10-14 days OT Treatment/Interventions: DME/adaptive equipment instruction;Discharge planning;Pain management;Patient/family education;Therapeutic Activities;UE/LE Coordination activities;UE/LE Strength taining/ROM;Therapeutic Exercise;Wheelchair propulsion/positioning;Self Care/advanced ADL retraining OT Self Feeding Anticipated Outcome(s): Mod I OT Basic Self-Care Anticipated Outcome(s): Min A OT Toileting Anticipated Outcome(s): Supervision OT Bathroom Transfers Anticipated Outcome(s): Min A   Skilled Therapeutic Intervention A1:1 Initial OT evaluation completed with treatment provided emphasizing functional transfers, energy conservation, pain management and effective use of DME/AE.   LH sponge provided during seated bathing.  Patient required 3 rest breaks during ADL with evidence of progressive decline in performance of transfers to include episode of dizziness and left leg instability from w/c > bed at end of session.  OT Evaluation Precautions/Restrictions  Precautions Precautions: Fall Precaution Comments: LE's "give way" per patient. Restrictions Weight Bearing Restrictions: No  General Chart Reviewed: Yes Missed Time Reason: Patient fatigue Family/Caregiver Present: No  Vital Signs Therapy Vitals Pulse Rate: 80 BP: 120/74 mmHg Patient Position, if appropriate: Standing Oxygen Therapy SpO2: 97 % O2 Device: None (Room air)  Pain Pain Assessment Pain Assessment: 0-10 Pain Score: 0-No pain Pain Type: Acute pain Pain Location: Leg Pain Orientation: Right;Left Pain Descriptors / Indicators: Aching Pain Frequency: Intermittent Pain Onset: On-going Patients Stated Pain Goal: 2 Pain Intervention(s): Medication (See eMAR)  Home Living/Prior Functioning Home Living Available Help at Discharge: Family;Available 24  hours/day Type of Home: House Home Access: Stairs to enter [employer] of Steps: 1 slab and 1 step into house Entrance Stairs-Rails: None Home Layout: Two level;Bed/bath upstairs Alternate Level Stairs-Number of Steps: Flight, 10-15 narrow stair with unstable railing Alternate Level Stairs-Rails: Right  Lives With: Spouse;Daughter IADL History Homemaking Responsibilities: Yes (I month prior to admission) Meal Prep Responsibility: Primary Laundry Responsibility: Primary Cleaning Responsibility: Primary Bill Paying/Finance Responsibility: Primary Shopping Responsibility: Primary Child Care Responsibility: Primary Current License: Yes Mode of Transportation: Car Occupation: Full time employment Type of Occupation: Haematologist, Airline pilot, 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 Leisure and Hobbies: swimming, dancing, knitting Prior Function Level of Independence: Independent with basic ADLs;Independent with homemaking with ambulation;Independent with transfers;Independent with gait  Able to Take Stairs?: Yes Driving: Yes Vocation: On disability (short-term disability since 06/2013) Vocation Requirements: Retail banker, demonstrating, driving Leisure: Hobbies-yes (Comment)  Vision/Perception  Vision - History Baseline Vision: Wears glasses all the time Patient Visual Report: Blurring of vision Vision - Assessment Eye Alignment: Within Functional Limits Perception Perception: Within Functional Limits Praxis Praxis: Intact   Cognition Overall Cognitive Status: Impaired/Different from baseline (increased confusion when fatigued) Arousal/Alertness: Awake/alert Orientation Level: Oriented X4 Attention: Alternating Selective Attention: Appears intact Alternating Attention: Appears intact Memory: Appears intact Awareness: Appears intact Problem Solving: Appears intact Executive Function: Self Monitoring Self Monitoring: Appears intact Safety/Judgment: Appears  intact  Sensation Sensation Light Touch: Impaired Detail Light Touch Impaired Details: Impaired RUE;Impaired RLE;Impaired LLE (numbness and tingling) Stereognosis: Appears Intact Hot/Cold: Appears Intact Proprioception: Impaired by gross assessment (right hand, inconsistently) Additional Comments: R side hyposensative; N/T noted in B forearms and below knee on R LE Coordination Gross Motor Movements are Fluid and Coordinated: Yes Fine Motor Movements are Fluid and Coordinated: No Coordination and Movement Description: decreased FMC when fatigued  Motor  Motor Motor - Skilled Clinical Observations: Decreased strength globally, but R side more impaired than L side; increased tone noted in R LE; Pt reports onset of tremors in L UE/LE when  faitgued 2/2 dependence on this side as it is stronger  Mobility  Bed Mobility Bed Mobility: Supine to Sit;Sit to Supine Supine to Sit: HOB flat;4: Min assist Supine to Sit Details: Manual facilitation for placement Sitting - Scoot to Edge of Bed: 4: Min assist Sitting - Scoot to [place] of Bed Details: Verbal cues for sequencing Sit to Supine: HOB flat;3: Mod assist Sit to Supine - Details: Verbal cues for sequencing Transfers Transfers: Sit to Stand;Stand to Sit Sit to Stand: 4: Min guard;With armrests Stand to Sit: 4: Min guard;With armrests   Trunk/Postural Assessment  Postural Control Postural Control: Within Functional Limits   Balance Balance Balance Assessed: Yes Static Sitting Balance Static Sitting - Balance Support: No upper extremity supported;Feet supported Static Sitting - Level of Assistance: 5: Stand by assistance Dynamic Sitting Balance Dynamic Sitting - Balance Support: Feet supported;Left upper extremity supported;Right upper extremity supported Dynamic Sitting - Level of Assistance: 4: Min assist Dynamic Sitting - Balance Activities: Lateral lean/weight shifting;Forward lean/weight shifting;Reaching for objects Dynamic  Sitting - Comments: decrease in balance and righting reaction when reaching below knees Static Standing Balance Static Standing - Balance Support: During functional activity;Bilateral upper extremity supported Static Standing - Level of Assistance: 4: Min assist Static Standing - Comment/# of Minutes: Standing endurance limited to 10-15 seconds 2/2 fatigue and dizziness; Pt reports being dependent on L LE during standing and transfers, L knee can "give way" when fatigued  Extremity/Trunk Assessment RUE Assessment RUE Assessment: Exceptions to [facility] RUE AROM (degrees) Overall AROM Right Upper Extremity: Within functional limits for tasks performed RUE Strength RUE Overall Strength: Deficits (2-/5, limited by pain with movement) LUE Assessment LUE Assessment: Within Functional Limits  FIM:  FIM - Grooming Grooming Steps: Wash, rinse, dry hands Grooming: 2: Patient completes 1 of 4 or 2 of 5 steps FIM - Bathing Bathing Steps Patient Completed: Chest;Right Arm;Left Arm;Abdomen;Front perineal area;Right upper leg;Left upper leg Bathing: 3: Mod-Patient completes 5-7 34f 10 parts or 50-74% FIM - Upper Body Dressing/Undressing Upper body dressing/undressing: 0: Wears gown/pajamas-no public clothing FIM - Lower Body Dressing/Undressing Lower body dressing/undressing: 1: Total-Patient completed less than 25% of tasks FIM - Toileting Toileting steps completed by patient: Adjust clothing prior to toileting;Performs perineal hygiene;Adjust clothing after toileting Toileting: 4: Steadying assist FIM - Press photographer Assistive Devices: Arm rests Bed/Chair Transfer: 4: Supine > Sit: Min A (steadying Pt. > 75%/lift 1 leg);4: Bed > Chair or W/C: Min A (steadying Pt. > 75%);3: Sit > Supine: Mod A (lifting assist/Pt. 50-74%/lift 2 legs);4: Chair or W/C > Bed: Min A (steadying Pt. > 75%) FIM - Diplomatic Services operational officer Devices: Grab bars Toilet Transfers: 3-From  toilet/BSC: Mod A (lift or lower assist);4-To toilet/BSC: Min A (steadying Pt. > 75%) FIM - Tub/Shower Transfers Tub/Shower Assistive Devices: Tub transfer bench;Grab bars;Walk in shower Tub/shower Transfers: 4-Into Tub/Shower: Min A (steadying Pt. > 75%/lift 1 leg);3-Out of Tub/Shower: Mod A (lift or lower/lift 2 legs)   Refer to Care Plan for Long Term Goals  Recommendations for other services: None  Discharge Criteria: Patient will be discharged from OT if patient refuses treatment 3 consecutive times without medical reason, if treatment goals not met, if there is a change in medical status, if patient makes no progress towards goals or if patient is discharged from hospital.  The above assessment, treatment plan, treatment alternatives and goals were discussed and mutually agreed upon: by patient  Second session: Time: 1130-1200 Time Calculation (min):  30 min  Pain Assessment: 3/10 at R-UE   Skilled Therapeutic Interventions: Therapeutic activities with emphasis on patient education on gross and fine motor control activities to improve functional use of right hand.   Assessed grip strength using Jamar hand dyno:   R = 1 lb (inconsistent deficit when compared to performance of ADL and use of grab bars)   L = 10 lbs. Patient able to hold arm in static positions for AROM assessment but reports initiation of movement through normal ROM as provoking severe pain.   Pt reported recovery from earlier PT session and she reported contacted with her husband to coordinate for loose-fitting clothing to replace clothing selected by her husband for daily ADL, as advised during morning ADL session.  See FIM for current functional status  Therapy/Group: Individual Therapy  Georgeanne Nim 08/06/2013, 12:59 PM

## 2013-08-06 NOTE — Progress Notes (Signed)
Subjective/Complaints: Had a good night. Feels that she needs to move her bowels this am. Pain under reasonable control. Used tylenol last night with fairly good effect.  A 12 point review of systems has been performed and if not noted above is otherwise negative.   Objective: Vital Signs: Blood pressure 140/95, pulse 72, temperature 98.2 F (36.8 C), temperature source Oral, resp. rate 19, height 5\' 3"  (1.6 m), weight 105.098 kg (231 lb 11.2 oz), last menstrual period 03/17/2012, SpO2 96.00%. No results found.  Recent Labs  08/06/13 0620  WBC 10.9*  HGB 13.3  HCT 38.0  PLT 290    Recent Labs  08/06/13 0620  NA 139  K 3.0*  CL 99  GLUCOSE 96  BUN 9  CREATININE 0.53  CALCIUM 8.6   CBG (last 3)   Recent Labs  08/05/13 1631 08/05/13 2150 08/06/13 0729  GLUCAP 95 104* 91    Wt Readings from Last 3 Encounters:  08/05/13 105.098 kg (231 lb 11.2 oz)  08/01/13 102.2 kg (225 lb 5 oz)  07/09/13 102.059 kg (225 lb)    Physical Exam:  Constitutional: She is oriented to person, place, and time. She appears well-developed and well-nourished. obese  HENT:  Head: Normocephalic and atraumatic.  Eyes: Conjunctivae are normal. Pupils are equal, round, and reactive to light.  Neck: Normal range of motion. Neck supple.  Cardiovascular: Normal rate and regular rhythm.  No murmur heard.  Pulmonary/Chest: Effort normal and breath sounds normal. No respiratory distress.  Abdominal: Soft. Bowel sounds are normal. She exhibits no distension. There is no tenderness.  Neurological: She is alert and oriented to person, place, and time.  Decreased visual acuity, complains of diplopia with tracking but no obvious asymmetries in gaze were seen. . She exhibits normal muscle tone. RUE is 4/5 deltoid, biceps, triceps, 4 to 4+ HI. LUE is 4+ to 5/5 in deltoid, biceps, triceps, HI. RLE is 1-2/5 HF, KE and 1+ ankle. LLE is 2+ to 3/5 HF, KE and 1+ to 2/5 left ankle. Decreased pp/lt both legs to  thigh and bilateral hands to wrist. Cognitively she displayed reasonable insight and awareness. Attention was good. She is quite alert.  Cerebellar testing finger-nose-finger normal in the upper and lower limbs  Skin: Skin is warm and dry. No erythema.    Assessment/Plan: 1. Functional deficits secondary to MS exacerbation which require 3+ hours per day of interdisciplinary therapy in a comprehensive inpatient rehab setting. Physiatrist is providing close team supervision and 24 hour management of active medical problems listed below. Physiatrist and rehab team continue to assess barriers to discharge/monitor patient progress toward functional and medical goals. FIM:                   Comprehension Comprehension Mode: Auditory Comprehension: 5-Understands complex 90% of the time/Cues < 10% of the time  Expression Expression Mode: Verbal Expression: 5-Expresses complex 90% of the time/cues < 10% of the time  Social Interaction Social Interaction: 6-Interacts appropriately with others with medication or extra time (anti-anxiety, antidepressant).  Problem Solving Problem Solving: 5-Solves complex 90% of the time/cues < 10% of the time  Memory Memory: 6-More than reasonable amt of time  Medical Problem List and Plan:  1. DVT Prophylaxis/Anticoagulation: Pharmaceutical: Lovenox  2. Pain Management: Continue prn tramadol for pain. Also tylenol 3. Mood: Provide ego support. LCSW to follow for evaluation.  4. Neuropsych: This patient is capable of making decisions on his own behalf.  5. DM type 2: Monitor with  AC/HS cbg checks. Titrated metformin to 1000 mg bid. Will continue meal coverage for now. Sugars showing signs of improvement. Watch for hypoglycemia 6. Polyneuropathy: Will continue neurontin (in place of lyrica)--she feels that this is working better. Titrate as needed  7. Insomnia: Will try trazodone prn.  8. Thrush-nystatin suspension  LOS (Days) 1 A FACE TO FACE  EVALUATION WAS PERFORMED  SWARTZ,ZACHARY T 08/06/2013 8:10 AM

## 2013-08-06 NOTE — Plan of Care (Signed)
Problem: RH PAIN MANAGEMENT Goal: RH STG PAIN MANAGED AT OR BELOW PT'S PAIN GOAL 4 or less out of 10  Outcome: Progressing Tylenol 650mg  effective for headache 6/10 pain

## 2013-08-06 NOTE — Plan of Care (Signed)
Problem: RH BOWEL ELIMINATION Goal: RH STG MANAGE BOWEL W/MEDICATION W/ASSISTANCE STG Manage Bowel with Medication with min Assistance.  Outcome: Progressing BM after receiving senna PRN 08/05/13

## 2013-08-06 NOTE — Progress Notes (Signed)
INITIAL NUTRITION ASSESSMENT  DOCUMENTATION CODES Per approved criteria  -Morbid Obesity   INTERVENTION: 1.  General healthful diet; encourage intake as needed.  2.  Supplements; Ensure Complete po BID, each supplement provides 350 kcal and 13 grams of protein.   NUTRITION DIAGNOSIS: Inadequate oral intake related to fatigue as evidenced by PO 0-100%.   Monitor:  1.  Food/Beverage; pt meeting >/=90% estimated needs with tolerance. 2.  Wt/wt change; monitor trends  Reason for Assessment: MST  37 y.o. female  Admitting Dx: Multiple sclerosis exacerbation  ASSESSMENT: Pt admitted with deconditioning r/t MS exacerbation.  PO intake has been variable; 0-100% of meals.  Pt in with SLP at time of visit.  RD to follow for ongoing assessment. Will order Ensure Complete to support intake.   Height: Ht Readings from Last 1 Encounters:  08/05/13 5\' 3"  (1.6 m)    Weight: Wt Readings from Last 1 Encounters:  08/05/13 231 lb 11.2 oz (105.098 kg)    Ideal Body Weight: 115 lbs  % Ideal Body Weight: 200%  Wt Readings from Last 10 Encounters:  08/05/13 231 lb 11.2 oz (105.098 kg)  08/01/13 225 lb 5 oz (102.2 kg)  07/09/13 225 lb (102.059 kg)  04/21/12 242 lb (109.77 kg)  03/29/12 256 lb 6.4 oz (116.302 kg)  03/25/12 252 lb (114.306 kg)  03/19/12 268 lb (121.564 kg)  03/19/12 268 lb (121.564 kg)  03/17/12 268 lb (121.564 kg)  02/18/12 264 lb (119.75 kg)    Usual Body Weight: 240 lbs  % Usual Body Weight: 96%  BMI:  Body mass index is 41.05 kg/(m^2).  Estimated Nutritional Needs: Kcal: 1840-1980 Protein: 75-90g Fluid: ~2.0 L/day  Skin: non-pitting edema  Diet Order: Cardiac  EDUCATION NEEDS: -Education needs addressed   Intake/Output Summary (Last 24 hours) at 08/06/13 1407 Last data filed at 08/06/13 0240  Gross per 24 hour  Intake    480 ml  Output      0 ml  Net    480 ml    Last BM: 9/4  Labs:   Recent Labs Lab 08/01/13 1806 08/02/13 0516  08/06/13 0620  NA 138 136 139  K 3.7 3.5 3.0*  CL 101 102 99  CO2 26 22 31   BUN 8 7 9   CREATININE 0.48* 0.51 0.53  CALCIUM 9.2 9.0 8.6  GLUCOSE 109* 188* 96    CBG (last 3)   Recent Labs  08/05/13 2150 08/06/13 0729 08/06/13 1124  GLUCAP 104* 91 94    Scheduled Meds: . baclofen  5 mg Oral TID  . enoxaparin (LOVENOX) injection  50 mg Subcutaneous Q24H  . gabapentin  300 mg Oral BID  . insulin aspart  0-15 Units Subcutaneous TID WC  . insulin aspart  0-5 Units Subcutaneous QHS  . interferon beta-1a  8.8 mcg Subcutaneous Q M,W,F  . metFORMIN  1,000 mg Oral BID WC  . nystatin  5 mL Oral QID  . pantoprazole  40 mg Oral Q1200    Continuous Infusions:   Past Medical History  Diagnosis Date  . Asthma   . Diabetes mellitus   . Hypertension   . PCOS (polycystic ovarian syndrome)   . Gastroparesis   . Polyneuropathy   . Multiple sclerosis     Dr. Anne Hahn  . Multiple sclerosis, primary progressive 2010    Past Surgical History  Procedure Laterality Date  . Cholecystectomy    . Dilation and curettage of uterus    . Laparoscopic gastric banding  01/26/08  .  Cesarean section    . Iud removal  03/19/2012    denies  . Colon surgery    . Abdominal hysterectomy      Loyce Dys, MS RD LDN Clinical Inpatient Dietitian Pager: [phone] Weekend/After hours pager: [phone]

## 2013-08-06 NOTE — Progress Notes (Signed)
Patient information reviewed and entered into eRehab system by Aren Cherne, RN, CRRN, PPS Coordinator.  Information including medical coding and functional independence measure will be reviewed and updated through discharge.    

## 2013-08-06 NOTE — Progress Notes (Signed)
Pt. c /o nausea "mild" throughout shift. Zofran with min'l relief; had 2 mod-large formed BM's; Pam Love,PAC aware.  This eve, pt. with multiple loose stools and c/o increased "numbness" BLE's; monitor, report to eve shift.

## 2013-08-07 ENCOUNTER — Inpatient Hospital Stay (HOSPITAL_COMMUNITY): Payer: No Typology Code available for payment source

## 2013-08-07 ENCOUNTER — Inpatient Hospital Stay (HOSPITAL_COMMUNITY): Payer: No Typology Code available for payment source | Admitting: Speech Pathology

## 2013-08-07 DIAGNOSIS — G35 Multiple sclerosis: Secondary | ICD-10-CM

## 2013-08-07 LAB — GLUCOSE, CAPILLARY
Glucose-Capillary: 91 mg/dL (ref 70–99)
Glucose-Capillary: 91 mg/dL (ref 70–99)
Glucose-Capillary: 94 mg/dL (ref 70–99)
Glucose-Capillary: 151 mg/dL — ABNORMAL HIGH (ref 70–99)

## 2013-08-07 LAB — BASIC METABOLIC PANEL
BUN: 8 mg/dL (ref 6–23)
CO2: 33 mEq/L — ABNORMAL HIGH (ref 19–32)
Calcium: 8.8 mg/dL (ref 8.4–10.5)
Chloride: 97 mEq/L (ref 96–112)
Creatinine, Ser: 0.64 mg/dL (ref 0.50–1.10)
GFR calc Af Amer: >90 mL/min (ref >90)
GFR calc non Af Amer: >90 mL/min (ref >90)
Glucose, Bld: 89 mg/dL (ref 70–99)
Potassium: 3.1 mEq/L — ABNORMAL LOW (ref 3.5–5.1)
Sodium: 137 mEq/L (ref 135–145)

## 2013-08-07 MED ORDER — GABAPENTIN 300 MG PO CAPS
300.0000 mg | ORAL_CAPSULE | Freq: Three times a day (TID) | ORAL | Status: DC
  Administered 2013-08-07 – 2013-08-09 (×7): 300 mg via ORAL
  Filled 2013-08-07 (×10): qty 1

## 2013-08-07 MED ORDER — TRAMADOL HCL 50 MG PO TABS
100.0000 mg | ORAL_TABLET | Freq: Four times a day (QID) | ORAL | Status: DC
  Administered 2013-08-07: 100 mg via ORAL
  Filled 2013-08-07: qty 2

## 2013-08-07 NOTE — Progress Notes (Signed)
Pt c/o nausea throughout day; VSS; also c/o "mild"  Dizziness( at rest. ) min'l relief from Zofran;  PAm Love PAC aware. Emesis at 1800, undigested food.  Ultram had been given 1 hour prior. Compazine IM given. Monitor.

## 2013-08-07 NOTE — IPOC Note (Signed)
Overall Plan of Care [facility] Patient Details Name: Feliciana Narayan MRN: [id number] DOB: 03-17-1976  Admitting Diagnosis: MS Exacerbation  Hospital Problems: Principal Problem:   Multiple sclerosis exacerbation Active Problems:   DIABETES MELLITUS, TYPE II   OBESITY, MORBID   HYPERTENSION   Co-morbidities: pain, see above   Functional Problem List: Nursing Bowel;Endurance;Medication Management;Pain;Safety;Sensory  PT Balance;Endurance;Motor;Perception;Safety;Sensory;Pain  OT Balance;Endurance;Motor;Pain  SLP Cognition  TR         Basic ADL's: OT Eating;Grooming;Bathing;Dressing;Toileting     Advanced  ADL's: OT Simple Meal Preparation;Laundry;Light Housekeeping     Transfers: PT Bed Mobility;Bed to Chair;Car;Furniture;Floor  OT Toilet;Tub/Shower     Locomotion: PT Ambulation;Wheelchair Mobility;Stairs     Additional Impairments: OT Fuctional Use of Upper Extremity  SLP Social Cognition   Memory;Problem Solving  TR      Anticipated Outcomes Item Anticipated Outcome  Self Feeding Mod I  Swallowing      Basic self-care  Min A  Toileting  Supervision   Bathroom Transfers Min A  Bowel/Bladder  remain continent of bowel and bladder  Transfers  Supervision to [employer] A   Locomotion  Supervision to Min A, combination of w/c and ambulation  Communication     Cognition  Mod I   Pain  4 or less out of 10  Safety/Judgment  Mod I    Therapy Plan: PT Intensity: Minimum of 1-2 x/day ,45 to 90 minutes PT Frequency: 5 out of 7 days;Total of 15 hours over 7 days Frequency due to: depending on tolerance 2/2 fatigue PT Duration Estimated Length of Stay: 2 weeks OT Intensity: Minimum of 1-2 x/day, 45 to 90 minutes OT Frequency: 5 out of 7 days OT Duration/Estimated Length of Stay: 10-14 days SLP Intensity: Minumum of 1-2 x/day, 30 to 90 minutes SLP Frequency: 5 out of 7 days SLP Duration/Estimated Length of Stay: 10-14 days        Team Interventions: Nursing  Interventions Patient/Family Education;Bowel Management;Disease Management/Prevention;Pain Management;Medication Management  PT interventions Ambulation/gait training;Balance/vestibular training;Community reintegration;Discharge planning;Disease management/prevention;Functional mobility training;Pain management;Patient/family education;Splinting/orthotics;Therapeutic Activities;UE/LE Coordination activities;Visual/perceptual remediation/compensation;Therapeutic Exercise;Stair training;UE/LE Strength taining/ROM;Wheelchair propulsion/positioning;Psychosocial support;Neuromuscular re-education;DME/adaptive equipment instruction  OT Interventions DME/adaptive equipment instruction;Discharge planning;Pain management;Patient/family education;Therapeutic Activities;UE/LE Coordination activities;UE/LE Strength taining/ROM;Therapeutic Exercise;Wheelchair propulsion/positioning;Self Care/advanced ADL retraining  SLP Interventions Cognitive remediation/compensation;Cueing hierarchy;Environmental controls;Internal/external aids;Patient/family education;Therapeutic Activities;Functional tasks  TR Interventions    SW/CM Interventions      Team Discharge Planning: Destination: PT-Home ,OT-   , SLP-Home Projected Follow-up: PT-Home health PT, OT-   , SLP-Outpatient SLP Projected Equipment Needs: PT- , OT-  , SLP-None recommended by SLP Patient/family involved in discharge planning: PT- Patient,  OT-Patient, SLP-Patient  MD ELOS: 12 days Medical Rehab Prognosis:  Excellent Assessment: The patient has been admitted for CIR therapies. The team will be addressing, functional mobility, strength, stamina, balance, safety, adaptive techniques/equipment, self-care, bowel and bladder mgt, patient and caregiver education, NMR, pain mgt, ego-supports. Goals have been set at supervision to minimal assist.    Ranelle Oyster, MD, [facility]      See Team Conference Notes for weekly updates to the plan of care

## 2013-08-07 NOTE — Progress Notes (Signed)
Subjective/Complaints: Back pain woke her up last night. Took tramadol which helped settle it down. Pain goes from back and radiates down either leg.  A 12 point review of systems has been performed and if not noted above is otherwise negative.   Objective: Vital Signs: Blood pressure 107/74, pulse 78, temperature 99.1 F (37.3 C), temperature source Oral, resp. rate 18, height 5\' 3"  (1.6 m), weight 105.098 kg (231 lb 11.2 oz), last menstrual period 03/17/2012, SpO2 93.00%. No results found.  Recent Labs  08/06/13 0620  WBC 10.9*  HGB 13.3  HCT 38.0  PLT 290    Recent Labs  08/06/13 0620 08/07/13 0640  NA 139 137  K 3.0* 3.1*  CL 99 97  GLUCOSE 96 89  BUN 9 8  CREATININE 0.53 0.64  CALCIUM 8.6 8.8   CBG (last 3)   Recent Labs  08/06/13 1658 08/06/13 2053 08/07/13 0713  GLUCAP 109* 102* 91    Wt Readings from Last 3 Encounters:  08/05/13 105.098 kg (231 lb 11.2 oz)  08/01/13 102.2 kg (225 lb 5 oz)  07/09/13 102.059 kg (225 lb)    Physical Exam:  Constitutional: She is oriented to person, place, and time. She appears well-developed and well-nourished. obese  HENT:  Head: Normocephalic and atraumatic.  Eyes: Conjunctivae are normal. Pupils are equal, round, and reactive to light.  Neck: Normal range of motion. Neck supple.  Cardiovascular: Normal rate and regular rhythm.  No murmur heard.  Pulmonary/Chest: Effort normal and breath sounds normal. No respiratory distress.  Abdominal: Soft. Bowel sounds are normal. She exhibits no distension. There is no tenderness.  Neurological: She is alert and oriented to person, place, and time.  Decreased visual acuity, with mild diplopia . She exhibits normal muscle tone. RUE is 4/5 deltoid, biceps, triceps, 4 to 4+ HI. LUE is 4+ to 5/5 in deltoid, biceps, triceps, HI. RLE is 1-2/5 HF, KE and 1+ ankle. LLE is 2+ to 3/5 HF, KE and 1+ to 2/5 left ankle. Decreased pp/lt both legs to thigh and bilateral hands to wrist.  Cognitively she displayed reasonable insight and awareness. Attention was good. She is quite alert.  Cerebellar testing finger-nose-finger normal in the upper and lower limbs  Skin: Skin is warm and dry. No erythema.    Assessment/Plan: 1. Functional deficits secondary to MS exacerbation which require 3+ hours per day of interdisciplinary therapy in a comprehensive inpatient rehab setting. Physiatrist is providing close team supervision and 24 hour management of active medical problems listed below. Physiatrist and rehab team continue to assess barriers to discharge/monitor patient progress toward functional and medical goals. FIM: FIM - Bathing Bathing Steps Patient Completed: Chest;Right Arm;Left Arm;Abdomen;Front perineal area;Right upper leg;Left upper leg Bathing: 3: Mod-Patient completes 5-7 26f 10 parts or 50-74%  FIM - Upper Body Dressing/Undressing Upper body dressing/undressing: 0: Wears gown/pajamas-no public clothing FIM - Lower Body Dressing/Undressing Lower body dressing/undressing: 1: Total-Patient completed less than 25% of tasks  FIM - Toileting Toileting steps completed by patient: Adjust clothing prior to toileting;Performs perineal hygiene;Adjust clothing after toileting Toileting: 3: Mod-Patient completed 2 of 3 steps  FIM - Diplomatic Services operational officer Devices: Grab bars Toilet Transfers: 4-To toilet/BSC: Min A (steadying Pt. > 75%);4-From toilet/BSC: Min A (steadying Pt. > 75%)  FIM - Bed/Chair Transfer Bed/Chair Transfer Assistive Devices: Arm rests Bed/Chair Transfer: 4: Supine > Sit: Min A (steadying Pt. > 75%/lift 1 leg);4: Bed > Chair or W/C: Min A (steadying Pt. > 75%);4: Chair or W/C >  Bed: Min A (steadying Pt. > 75%);4: Sit > Supine: Min A (steadying pt. > 75%/lift 1 leg)  FIM - Locomotion: Wheelchair Locomotion: Wheelchair: 1: Travels less than 50 ft with minimal assistance (Pt.>75%) FIM - Locomotion: Ambulation Locomotion: Ambulation  Assistive Devices: Designer, industrial/product Ambulation/Gait Assistance: 4: Min assist Locomotion: Ambulation: 1: Travels less than 50 ft with maximal assistance (Pt: 25 - 49%)  Comprehension Comprehension Mode: Auditory Comprehension: 5-Understands complex 90% of the time/Cues < 10% of the time  Expression Expression Mode: Verbal Expression: 7-Expresses complex ideas: With no assist  Social Interaction Social Interaction: 6-Interacts appropriately with others with medication or extra time (anti-anxiety, antidepressant).  Problem Solving Problem Solving: 4-Solves basic 75 - 89% of the time/requires cueing 10 - 24% of the time  Memory Memory: 5-Recognizes or recalls 90% of the time/requires cueing < 10% of the time  Medical Problem List and Plan:  1. DVT Prophylaxis/Anticoagulation: Pharmaceutical: Lovenox  2. Pain Management: Continue prn tramadol for pain. Also tylenol  -asked her to try to take a ultram close to bed time for now to help  -will also increase gabapentin to 300mg  tid 3. Mood: Provide ego support. LCSW to follow for evaluation.  4. Neuropsych: This patient is capable of making decisions on his own behalf.  5. DM type 2: Monitor with AC/HS cbg checks. Titrated metformin to 1000 mg bid. Will continue meal coverage for now. Sugars showing signs of improvement. Watch for hypoglycemia 6. Polyneuropathy: Will continue neurontin (in place of lyrica)--she feels that this is working better. Titrate as needed  7. Insomnia: Will try trazodone prn.  8. Thrush-nystatin suspension  LOS (Days) 2 A FACE TO FACE EVALUATION WAS PERFORMED  SWARTZ,ZACHARY T 08/07/2013 8:26 AM

## 2013-08-07 NOTE — Progress Notes (Signed)
Speech Language Pathology Daily Session Note  Patient Details  Name: [name] MRN: [id number] Date of Birth: 1975-12-22  Today's Date: 08/07/2013 Time: 4098-1191 Time Calculation (min): 15 min  Short Term Goals: Week 1: SLP Short Term Goal 1 (Week 1): Pt will demonstrate complex problem solving with functional and familiar tasks with supervision verbal cues.  SLP Short Term Goal 2 (Week 1): Pt will utilize external memory aids to recall new, daily information with Mod I.  SLP Short Term Goal 3 (Week 1): Pt will utilize visual aids/cues to increase organization of functional information with supervision verbal cues.   Skilled Therapeutic Interventions: Treatment focus on cognitive goals. Upon entering the room, the pt reported she was fatigued and not feeling well but agreed to participate in session. SLP facilitated session by providing supervision visual cues and extra time for functional problem solving with money management task in regards to counting and making change. Pt reported dizziness and declined further participation in session. RN made aware.    FIM:  Comprehension Comprehension Mode: Auditory Comprehension: 5-Follows basic conversation/direction: With no assist Expression Expression Mode: Verbal Expression: 6-Expresses complex ideas: With extra time/assistive device Social Interaction Social Interaction: 5-Interacts appropriately 90% of the time - Needs monitoring or encouragement for participation or interaction. Problem Solving Problem Solving: 5-Solves basic problems: With no assist Memory Memory: 6-More than reasonable amt of time  Pain No/Denies Pain  Therapy/Group: Individual Therapy  [name] 08/07/2013, 2:00 PM

## 2013-08-07 NOTE — Progress Notes (Signed)
Occupational Therapy Session Note  Patient Details  Name: [name] MRN: [id number] Date of Birth: 10-Oct-1976  Today's Date: 08/07/2013 Time: 4098-1191 Time Calculation (min): 55 min  Short Term Goals: Week 1:  OT Short Term Goal 1 (Week 1): Patient will complete bathing, sitting and standing, uisng AE prn and mod assist. OT Short Term Goal 2 (Week 1): Patient will demonstrate improved use of right UE function as evidenced by performance of ADL using right hand at diminished level OT Short Term Goal 3 (Week 1): Patient will complete tub/shower transfer using tub bench with mod A. OT Short Term Goal 4 (Week 1): Patient will demonstrate ability to tolerate 60 min of ADL with only 1 rest break. OT Short Term Goal 5 (Week 1): Patient will prepare 1 simple meal at w/c levell using AE prn, with min assist.  Skilled Therapeutic Interventions/Progress Updates: ADL-retraining with emphasis on improved transfers skills, skilled use of AE to improve independence with LB bathing, and activity tolerance.    Patient completed bed mobility unassisted using bed rails only and transferred from sitting at EOB to w/c using squat pivot with standby assist.   Patient transferred from w/c to tub bench, squat pivot, and performed all bathing unassisted, using AE as advised and lateral leans for buttocks.   Patient completed bath in 30 minutes to include hair care with bil UE and completed another squat pivot transfer to w/c but reported symptoms of fatigue, which increased during attempt at seated dressing.  Patient reported episode of dizziness and requested urgent bed rest prior to donning underwear and pants.  She was assisted back to bed, mod assist for squat pivot transfer and bed mobility to recover to supine.  Total assist was required for lower body dressing; patient donned shirt with min assist to pull shirt over trunk while she rolled right to left.   RN was notified and arrived as pain continued to escalate to  9/10 at termination of OT session.  Therapy Documentation Precautions:  Precautions Precautions: Fall Precaution Comments: LE's "give way" per patient. Restrictions Weight Bearing Restrictions: No  Pain: Initial pain 0/10, sudden onset pain at 9:38 a.m. (see below) Pain Assessment Pain Assessment: 0-10 Pain Score: 9  Pain Location: Leg Pain Orientation: Right Pain Descriptors / Indicators: Jabbing Pain Onset: Sudden Patients Stated Pain Goal: 2 Pain Intervention(s): Medication (See eMAR) Multiple Pain Sites: Yes 2nd Pain Site Pain Score: 9 Pain Type: Acute pain Pain Location: Generalized Pain Orientation: Right Pain Descriptors / Indicators: Jabbing Pain Frequency: Intermittent Pain Onset: Sudden Patient's Stated Pain Goal: 2  See FIM for current functional status  Second session: Time: 1145-1210 Time Calculation (min):  25 min  Pain Assessment: 6/10, generalized pain/paresthesias  Skilled Therapeutic Interventions: Therapeutic activities with emphasis on seated hand strengthening using thera-putty (light resistance, tan).   Patient completed gross grip strengthening, isolated finger flexion, isolated finger extension, and interossei strengthening as instructed.  Written graphic literature provided with demonstration and recommendation to continue 3-4 times per day, working through 19 separate exercises presented.  See FIM for current functional status  Therapy/Group: Individual Therapy  Third session: Time: 4782-9562 Time Calculation (min):  8 min  Pain Assessment: 6/10 Missed Treatment Time: 15 min  Skilled Therapeutic Interventions: Therapeutic activity with emphasis on patient education on principles of energy conservation, symptom mangement and compensation for episodes of flare-up pain.   Patient remained seated in bed (HOB elevated, legs extended)  but was unable to tolerate or continue to participate actively in  activity planned due to severe fatigue.   Patient requested termination of session to continue recovery resting but reported that she anticipated progress with activity tolerance from recent introduction of tramadol schedule.  See FIM for current functional status  Therapy/Group: Individual Therapy  Therapy/Group: Individual Therapy  Georgeanne Nim 08/07/2013, 9:28 AM

## 2013-08-07 NOTE — Progress Notes (Signed)
Physical Therapy Session Note  Patient Details  Name: Nolie Bignell MRN: [id number] Date of Birth: December 29, 1975  Today's Date: 08/07/2013 Time: 1510-1540 Time Calculation (min): 30 min  Short Term Goals: Week 1:  PT Short Term Goal 1 (Week 1): Pt to perform bed mobility w/ min A PT Short Term Goal 2 (Week 1): Pt to perform transfers w/ min guard assist PT Short Term Goal 3 (Week 1): Pt to propel w/c x50' w/ min A PT Short Term Goal 4 (Week 1): Pt to ambulate 30' w/ RW and min A  PT Short Term Goal 5 (Week 1): Pt to negotiate up/down 1 curb step w/ RW and mod A   Skilled Therapeutic Interventions/Progress Updates:    1:1. Pt semi-reclined in bed and emotional upon entry w/ mother in room. Pt declined OOB mobility today 2/2 fatigue and verbalized feeling of increased numbness and decreased motor control in B LE today vs. yesterday. Pt provided emotional support and education regarding disease management/benefit from therapies. Therapist performed passive stretching to B LE to promote ROM and muscle extensibility. Stretching performed to bilateral: gastroc/soleus complex, hamstrings (+/-) knee extension, internal/external hip rotation. Therapist noted increased tone as well as decreased muscle extensibility in L LE vs. R LE.   Therapy Documentation Precautions:  Precautions Precautions: Fall Precaution Comments: LE's "give way" per patient. Restrictions Weight Bearing Restrictions: No   See FIM for current functional status  Therapy/Group: Individual Therapy  Denzil Hughes 08/07/2013, 3:47 PM

## 2013-08-08 ENCOUNTER — Encounter (HOSPITAL_COMMUNITY): Payer: No Typology Code available for payment source

## 2013-08-08 ENCOUNTER — Inpatient Hospital Stay (HOSPITAL_COMMUNITY): Payer: No Typology Code available for payment source | Admitting: Speech Pathology

## 2013-08-08 ENCOUNTER — Inpatient Hospital Stay (HOSPITAL_COMMUNITY): Payer: No Typology Code available for payment source | Admitting: Physical Therapy

## 2013-08-08 ENCOUNTER — Inpatient Hospital Stay (HOSPITAL_COMMUNITY): Payer: No Typology Code available for payment source | Admitting: *Deleted

## 2013-08-08 LAB — GLUCOSE, CAPILLARY
Glucose-Capillary: 95 mg/dL (ref 70–99)
Glucose-Capillary: 123 mg/dL — ABNORMAL HIGH (ref 70–99)
Glucose-Capillary: 113 mg/dL — ABNORMAL HIGH (ref 70–99)
Glucose-Capillary: 118 mg/dL — ABNORMAL HIGH (ref 70–99)

## 2013-08-08 MED ORDER — TRAMADOL HCL 50 MG PO TABS
50.0000 mg | ORAL_TABLET | Freq: Four times a day (QID) | ORAL | Status: DC | PRN
  Administered 2013-08-08 – 2013-08-18 (×12): 50 mg via ORAL
  Filled 2013-08-08 (×13): qty 1

## 2013-08-08 MED ORDER — HYDROCODONE-ACETAMINOPHEN 5-325 MG PO TABS
1.0000 | ORAL_TABLET | Freq: Four times a day (QID) | ORAL | Status: DC | PRN
  Administered 2013-08-08 – 2013-08-14 (×15): 1 via ORAL
  Filled 2013-08-08 (×15): qty 1

## 2013-08-08 NOTE — Progress Notes (Signed)
Speech Language Pathology Daily Session Note  Patient Details  Name: [name] MRN: [id number] Date of Birth: Aug 30, 1976  Today's Date: 08/08/2013 Time: 1335-1405 Time Calculation (min): 30 min  Short Term Goals: Week 1: SLP Short Term Goal 1 (Week 1): Pt will demonstrate complex problem solving with functional and familiar tasks with supervision verbal cues.  SLP Short Term Goal 2 (Week 1): Pt will utilize external memory aids to recall new, daily information with Mod I.  SLP Short Term Goal 3 (Week 1): Pt will utilize visual aids/cues to increase organization of functional information with supervision verbal cues.   Skilled Therapeutic Interventions: Therapeutic intervention complete, focusing on cognitive skills.  Patient given basic math exercises, and she required extra time to process information and answer questions.  Using external aids in hospital and at home were discussed, so that she may function as independently as possible,  upon return home.    FIM:  Comprehension Comprehension Mode: Auditory Comprehension: 5-Follows basic conversation/direction: With extra time/assistive device Expression Expression Mode: Verbal Expression: 6-Expresses complex ideas: With extra time/assistive device Problem Solving Problem Solving: 5-Solves basic problems: With no assist Memory Memory: 6-More than reasonable amt of time  Pain Pain Assessment Pain Assessment: No/denies pain Pain Score: 8  Pain Location: Leg Pain Orientation: Right;Left;Lower Pain Descriptors / Indicators: Shooting Pain Frequency: Intermittent Pain Onset: With Activity Patients Stated Pain Goal: 2 Pain Intervention(s): Medication (See eMAR)  Therapy/Group: Individual Therapy  Lenny Pastel 08/08/2013, 4:00 PM

## 2013-08-08 NOTE — Progress Notes (Addendum)
Occupational Therapy Note Patient Details  Name: [name] MRN: [id number] Date of Birth: 04-24-1976 Today's Date: 08/08/2013  Time:  1130-1230  (60 min)  1st session Pain:  4/10 and increasing to 5/10 by end of session Individual session    Pt. Sitting in wc upon OT arrival.  Pt. Did not want to bathe and was already dressed.  Addressed grooming at sink using built up handles for toothbrush.  Instructed pt to prop right elbow on side of sink to give support while brushing teeth.  Observed pt doing more head movements than arm movements and encouraged her to move arm as much as possible.  She used LUE to assist as well.  Did brushing hair the same strategy.  Unable to reach the back of hair but put it up on left side.  Practiced handwriting and knitting.  Provided pt written exercises to do when not in therapy.  Left pt in wc with call bell,phone within reach.     Time:  1400-1445  (45 min)  2nd session Pain:  4/10 and increasing to 6/10 by end of session Individual session  Performed transfers, UE AROM/PROM/AAROM.  Pt. Transferred from wc to mat with min assist performing squat pivot.  Did RUE stretching shoulder at 90 degrees flexion /abduction/ external rotation.  Did UB rotation with UE touching side to side.  At end of session, pt complained of burning sensation in shoulders.  Applied heat  For 15 minutes.  Transferred to toilet with min assist and able to stand and doff pants.  Left pt with nursing on toilet.    Humberto Seals 08/08/2013, 12:55 PM

## 2013-08-08 NOTE — Progress Notes (Signed)
Physical Therapy Session Note  Patient Details  Name: [name] MRN: [id number] Date of Birth: [birth year]  Today's Date: 08/08/2013 Time: 0906-1002 Time Calculation (min): 56 min  Short Term Goals: Week 1:  PT Short Term Goal 1 (Week 1): Pt to perform bed mobility w/ min A PT Short Term Goal 2 (Week 1): Pt to perform transfers w/ min guard assist PT Short Term Goal 3 (Week 1): Pt to propel w/c x50' w/ min A PT Short Term Goal 4 (Week 1): Pt to ambulate 30' w/ RW and min A  PT Short Term Goal 5 (Week 1): Pt to negotiate up/down 1 curb step w/ RW and mod A   Skilled Therapeutic Interventions/Progress Updates:   Pt resting in bed; pt denies pain, dizziness or nausea this am at rest.  Pt performed supine > sit with HOB elevated and bed rail with supervision.  Pt reporting need to urinate.  Performed squat pivot transfers to [facility] to R with arm rests and min A.  While sitting on BS pt donned shirt and therapist assisted with pants donning.  Pt performed hygiene in sitting and then performed sit > stand from [facility] and stood with UE support on RW to pull up pants.  Pt reports she puts all her weight on the LLE and would like to find a way to work on putting more weight on RLE.  Performed stand pivot transfers BSC > bed > w/c with RW and min A.  Performed w/c mobility training and RUE strength training with max hand over hand assistance x 5-10' with assistance needed to maintain grip on rim while pt performed UE extension with extra time.  During w/c mobility pt began to c/o forearm cramping and lightheadedness.  Ceased w/c mobility to conserve energy and pt transported to gym total A.  In standing performed dynamic balance, weight shift and pre-gait training with bilat UE support on stair rails during R and L foot taps to 4" step to focus on lateral and anterior weight shifting, upright trunk and proximal stability to allow for initiation of LE movement from hip/pelvis for swing and also focus on activation of  contralateral LE extension activation for stability in stance.  Required physical assistance to fully clear R foot to step.  During foot taps pt reporting 6/10 dizziness/lightheadedness; returned to sitting but unable to get BP reading with [employer], manual BP recorded below.  During seated rest breaks pt dizziness decreased and pt felt she would be able to perform gait with RW.  Performed gait x 12' with RW and mod A with verbal and tactile cues to maintain upright trunk, lateral and anterior weight shifts and cues for initiation of hip and knee flexion on RLE for swing phase.  Returned to w/c to rest before OT B&D.  Pt fatigued but tolerated well.    Therapy Documentation Precautions:  Precautions Precautions: Fall Precaution Comments: LE's "give way" per patient. Restrictions Weight Bearing Restrictions: No Vital Signs: Therapy Vitals Pulse Rate: 84 BP: 140/100 mmHg Patient Position, if appropriate: Sitting (after standing exercises) Pain:  No c/o pain Locomotion : Ambulation Ambulation/Gait Assistance: 3: Mod assist Wheelchair Mobility Distance: 5   See FIM for current functional status  Therapy/Group: Individual Therapy  Edman Circle [facility] 08/08/2013, 10:29 AM

## 2013-08-08 NOTE — Progress Notes (Addendum)
Subjective/Complaints: Tramadol makes her vomit/nauseas. Tylenol generally works.  A 12 point review of systems has been performed and if not noted above is otherwise negative.   Objective: Vital Signs: Blood pressure 89/54, pulse 79, temperature 98.4 F (36.9 C), temperature source Oral, resp. rate 18, height 5\' 3"  (1.6 m), weight 105.098 kg (231 lb 11.2 oz), last menstrual period 03/17/2012, SpO2 96.00%. No results found.  Recent Labs  08/06/13 0620  WBC 10.9*  HGB 13.3  HCT 38.0  PLT 290    Recent Labs  08/06/13 0620 08/07/13 0640  NA 139 137  K 3.0* 3.1*  CL 99 97  GLUCOSE 96 89  BUN 9 8  CREATININE 0.53 0.64  CALCIUM 8.6 8.8   CBG (last 3)   Recent Labs  08/07/13 1632 08/07/13 2104 08/08/13 0724  GLUCAP 94 151* 95    Wt Readings from Last 3 Encounters:  08/05/13 105.098 kg (231 lb 11.2 oz)  08/01/13 102.2 kg (225 lb 5 oz)  07/09/13 102.059 kg (225 lb)    Physical Exam:  Constitutional: She is oriented to person, place, and time. She appears well-developed and well-nourished. obese  HENT:  Head: Normocephalic and atraumatic.  Eyes: Conjunctivae are normal. Pupils are equal, round, and reactive to light.  Neck: Normal range of motion. Neck supple.  Cardiovascular: Normal rate and regular rhythm.  No murmur heard.  Pulmonary/Chest: Effort normal and breath sounds normal. No respiratory distress.  Abdominal: Soft. Bowel sounds are normal. She exhibits no distension. There is no tenderness.  Neurological: She is alert and oriented to person, place, and time.  Decreased visual acuity, with mild diplopia . She exhibits normal muscle tone. RUE is 4/5 deltoid, biceps, triceps, 4 to 4+ HI. LUE is 4+ to 5/5 in deltoid, biceps, triceps, HI. RLE is 1-2/5 HF, KE and 1+ ankle. LLE is 2+ to 3/5 HF, KE and 1+ to 2/5 left ankle. Decreased pp/lt both legs to thigh and bilateral hands to wrist. Cognitively she displayed reasonable insight and awareness. Attention was good.  She is quite alert.  Cerebellar testing finger-nose-finger normal in the upper and lower limbs  Skin: Skin is warm and dry. No erythema.    Assessment/Plan: 1. Functional deficits secondary to MS exacerbation which require 3+ hours per day of interdisciplinary therapy in a comprehensive inpatient rehab setting. Physiatrist is providing close team supervision and 24 hour management of active medical problems listed below. Physiatrist and rehab team continue to assess barriers to discharge/monitor patient progress toward functional and medical goals. FIM: FIM - Bathing Bathing Steps Patient Completed: Chest;Right Arm;Left Arm;Abdomen;Front perineal area;Buttocks;Right upper leg;Left upper leg;Right lower leg (including foot);Left lower leg (including foot) Bathing: 6: Assistive device (Comment) (LH sponge, lateral leans for buttocks)  FIM - Upper Body Dressing/Undressing Upper body dressing/undressing steps patient completed: Thread/unthread right sleeve of pullover shirt/dresss;Thread/unthread left sleeve of pullover shirt/dress;Put head through opening of pull over shirt/dress Upper body dressing/undressing: 4: Min-Patient completed 75 plus % of tasks FIM - Lower Body Dressing/Undressing Lower body dressing/undressing steps patient completed: Pull pants up/down;Don/Doff left sock Lower body dressing/undressing: 1: Total-Patient completed less than 25% of tasks  FIM - Toileting Toileting steps completed by patient: Adjust clothing prior to toileting;Performs perineal hygiene;Adjust clothing after toileting Toileting: 3: Mod-Patient completed 2 of 3 steps  FIM - Diplomatic Services operational officer Devices: Grab bars Toilet Transfers: 4-To toilet/BSC: Min A (steadying Pt. > 75%);4-From toilet/BSC: Min A (steadying Pt. > 75%)  FIM - Banker  Devices: Bed rails;Arm rests;HOB elevated Bed/Chair Transfer: 0: Activity did not occur  FIM -  Locomotion: Wheelchair Locomotion: Wheelchair: 0: Activity did not occur FIM - Locomotion: Ambulation Locomotion: Ambulation Assistive Devices: Designer, industrial/product Ambulation/Gait Assistance: 4: Min assist Locomotion: Ambulation: 0: Activity did not occur  Comprehension Comprehension Mode: Auditory Comprehension: 5-Follows basic conversation/direction: With no assist  Expression Expression Mode: Verbal Expression: 6-Expresses complex ideas: With extra time/assistive device  Social Interaction Social Interaction: 5-Interacts appropriately 90% of the time - Needs monitoring or encouragement for participation or interaction.  Problem Solving Problem Solving: 5-Solves basic problems: With no assist  Memory Memory: 6-More than reasonable amt of time  Medical Problem List and Plan:  1. DVT Prophylaxis/Anticoagulation: Pharmaceutical: Lovenox  2. Pain Management: tylenol prn, reduce tramadol given intolerance  -increased gabapentin to 300mg  tid (discussed with pt) 3. Mood: Provide ego support. LCSW to follow for evaluation.  4. Neuropsych: This patient is capable of making decisions on his own behalf.  5. DM type 2: Monitor with AC/HS cbg checks. Titrated metformin to 1000 mg bid. Will continue meal coverage for now. Sugars showing signs of improvement. Watch for hypoglycemia 6. Polyneuropathy: Will continue neurontin (in place of lyrica)--she feels that this is working better. Titrate as needed  7. Insomnia: Will try trazodone prn.  8. Thrush-nystatin suspension  LOS (Days) 3 A FACE TO FACE EVALUATION WAS PERFORMED  [name] 08/08/2013 9:06 AM

## 2013-08-08 NOTE — Progress Notes (Signed)
Social Work  Social Work Assessment and Plan  Patient Details  Name: [name] MRN: [id number] Date of Birth: 06-04-1976  Today's Date: 08/08/2013  Problem List:  Patient Active Problem List   Diagnosis Date Noted  . Exacerbation of multiple sclerosis 08/01/2013  . Multiple sclerosis exacerbation 06/30/2013  . Preoperative examination 02/01/2012  . MIGRAINE HEADACHE 12/15/2010  . SINUSITIS 01/19/2010  . BRONCHITIS 01/12/2010  . Multiple sclerosis 06/16/2009  . ASTHMA, WITH ACUTE EXACERBATION 03/23/2009  . ALLERGIC RHINITIS 02/07/2009  . URINARY FREQUENCY 12/23/2008  . ASTHMATIC BRONCHITIS, ACUTE 12/17/2008  . PARESTHESIA 11/24/2008  . ACUT SUPPRATV OTITIS MEDIA W/O SPONT RUP EARDRUM 09/17/2008  . EPICONDYLITIS 09/02/2008  . SNORING 03/29/2008  . FATIGUE 03/02/2008  . 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 OBESITY, MORBID 02/23/2008  . ABDOMINAL PAIN, GENERALIZED 02/23/2008  . FATTY LIVER DISEASE 01/27/2008  . HYPERLIPIDEMIA 01/19/2008  . LIVER FUNCTION TESTS, ABNORMAL 01/19/2008  . DIABETES MELLITUS, TYPE II 06/27/2007  . HYPERTENSION 06/27/2007  . ASTHMA 06/27/2007  . GERD 06/27/2007   Past Medical History:  Past Medical History  Diagnosis Date  . Asthma   . Diabetes mellitus   . Hypertension   . PCOS (polycystic ovarian syndrome)   . Gastroparesis   . Polyneuropathy   . Multiple sclerosis     Dr. Anne Hahn  . Multiple sclerosis, primary progressive 2010   Past Surgical History:  Past Surgical History  Procedure Laterality Date  . Cholecystectomy    . Dilation and curettage of uterus    . Laparoscopic gastric banding  01/26/08  . Cesarean section    . Iud removal  03/19/2012    denies  . Colon surgery    . Abdominal hysterectomy     Social History:  reports that she has never smoked. She has never used smokeless tobacco. She reports that she does not drink alcohol or use illicit drugs.  Family / Support Systems Marital Status: Married How Long?: 3 yrs married but  "together" x 10 yrs Patient Roles: Spouse;Parent;Other (Comment) (daughter) Spouse/Significant Other: spouse, Shaquia Berkley @ [phone] Children: Pt has 87 yr old daughter who is disabled and in the care of a guardian.  She has a 74 y.o. step-daughter at home,Olivia Other Supports: pt's mother, Rosalie Gums, here from her home in [place] "as long as I am needed" @ (C) [phone] Anticipated Caregiver: her mother, Corrie Dandy Ramirez,& her husband Ability/Limitations of Caregiver: S-Min A Caregiver Availability: 24/7 Family Dynamics: pt reports very good support from her husband and mother - states "My husband is awesome!  He has done more for me than anyone should have to do." i.e. cleaning after toileting.  Pt becomes tearful with this statement.   Notes that her 82 yo daughter has been very "quiet" about her illness and only asks "when are you coming home" but, otherwise, does not discuss situation openly.  Social History Preferred language: English Religion: Non-Denominational Cultural Background: NA Education: "a few credits from getting my degree in Anthropologie" @ [employer] "...but I got sick" Read: Yes Write: Yes Employment Status: Employed Name of Employer: [employer]   - currenly out on STD (x 2mos) Length of Employment:  (began Oct 2013) Return to Work Plans: TBD - pt hopeful she might be able to return, however, is also pursuing SSD  Legal Hisotry/Current Legal Issues: Working with a local legal firm on her SSD claim Guardian/Conservator: none   Abuse/Neglect Physical Abuse: Denies Verbal Abuse: Denies Sexual Abuse: Denies Exploitation of patient/patient's resources: Denies Self-Neglect: Denies  Emotional Status Pt's affect, behavior adn adjustment status: Pt very soft spoken throughout interview and admits feeling "sleepy", however, completes process without difficulty.  Mother at bedside and very attentive.  Pt speaks easily about her original diagnosis of MS in 2010 and of her  recovery and ability to return to work and even make "top [employer] person" award.  She notes, "I was great. I was killing it" (physically) until this relapse.  Notes this is only her second hospitalizaiton.  She expresses concern over the severity of this relapse and what this may indicate for her future MS course.  She is open about her fears of becoming too much of a burden on her husband and family.  She worries about her young daughter and how "closed" she appears about discussion about her illness.  Pt denies any feelings of "true depression...no..Marland KitchenI am worried but I really don't feel depressed."  Deferring formal depression screen currently but feel need to monitor mood closely through her stay here.  May benefit from neuropsych for coping.  Have offered to include younger daughter in CIR program with pt as much as pt would like to possible aide in opening some discussion with daughter. Mother very attentive to pt's distress during interview, looking very intently at her with much concern.  She stresses that she plans to be here with daughter as long as needed " I am here." Recent Psychosocial Issues: None  Pyschiatric History: None Substance Abuse History: None  Patient / Family Perceptions, Expectations & Goals Pt/Family understanding of illness & functional limitations: Pt with very good understanding of her MS and potential courses it may take and longer term management issues.  Notes she has taken advantage of support offered from local MS Society chapter (i.e. support group, DME assistance and "tons of literature").  Stressed to her that they could also be a very good resource for her daugher and husband as well. Premorbid pt/family roles/activities: As noted, until this decline began (approx two months), pt was working f/t and without any difficulty Anticipated changes in roles/activities/participation: Pt may require 24/7 assistance dependent on gains made here.  Mother to assume primary  caregiver duties as husband works 12 hr shifts. Pt/family expectations/goals: "I just want to get better.  This is bad."  [employer]: Other (Comment) (MS Society - local chapter) Premorbid Home Care/DME Agencies: Other (Comment) ([facility] PTA) Transportation available at discharge: yes Resource referrals recommended: Psychology;Support group (specify);Advocacy groups  Discharge Planning Living Arrangements: Spouse/significant other;Children Support Systems: Spouse/significant other;Children;Parent;Home care staff;Organized support group (Comment) Type of Residence: Private residence Insurance Resources: Self-pay ([place] Card;  [place] app pending) Architect: Employment (STD) Financial Screen Referred: Previously completed Living Expenses: Psychologist, sport and exercise Management: Spouse Does the patient have any problems obtaining your medications?: No (there are financial issues but can get meds via [employer]) Home Management: husband and mother PTA with pt's functional decline Patient/Family Preliminary Plans: Pt plans to return home with husband and daughter.  Mother to stay indefinitely at this point.  Husband in process of quickly trying to secure a one level home/ apt prior to pt's d/c. Barriers to Discharge: Steps Social Work Anticipated Follow Up Needs: HH/OP;Support Group Expected length of stay: 2 weeks  Clinical Impression Very pleasant, open young woman here with her 2nd MS flare/ hospitalization since originally being diagnosed in 2010.  Much concern over her longer term MS course and demands this may continue to place on her husband and family.  Speaks easily with  me and denies any "true" s/s of depression, however, need to monitor.  Have offered family training esp to include 70 y.o. Daughter as pt is concerned about her currently coping/ adjustment needs.    Barbra Miner 08/08/2013, 8:58 AM

## 2013-08-09 ENCOUNTER — Inpatient Hospital Stay (HOSPITAL_COMMUNITY): Payer: No Typology Code available for payment source | Admitting: *Deleted

## 2013-08-09 LAB — GLUCOSE, CAPILLARY
Glucose-Capillary: 101 mg/dL — ABNORMAL HIGH (ref 70–99)
Glucose-Capillary: 81 mg/dL (ref 70–99)
Glucose-Capillary: 104 mg/dL — ABNORMAL HIGH (ref 70–99)
Glucose-Capillary: 124 mg/dL — ABNORMAL HIGH (ref 70–99)

## 2013-08-09 MED ORDER — GABAPENTIN 400 MG PO CAPS
400.0000 mg | ORAL_CAPSULE | Freq: Three times a day (TID) | ORAL | Status: DC
  Administered 2013-08-09 – 2013-08-12 (×9): 400 mg via ORAL
  Filled 2013-08-09 (×12): qty 1

## 2013-08-09 NOTE — Progress Notes (Signed)
Occupational Therapy Note   Patient Details  Name: [name] MRN: [id number] Date of Birth: [birth year] Today's Date: 08/09/2013  Time: 1530-1630  (60 min) Pain:  none Individual session  Pt. Lying down upon OT arrival.  She reported she was vomiting earlier and was feeling woozy from the medication.  Agreed to work with OT.  Engaged in therapeutic activities for upper extremity AROM, fine motor control, manipulation of objects.  Pt.returned demonstration of exercises instructed on Saturday.  Performed knitting with much better speed and manipuation of yarn.  Able to move right toes and ankle.  Getting more movement and not as tired today.  Mother present during session.  Left pt in bed sitting unsupported and knitting.    Humberto Seals 08/09/2013, 5:22 PM

## 2013-08-09 NOTE — Plan of Care (Signed)
Problem: RH PAIN MANAGEMENT Goal: RH STG PAIN MANAGED AT OR BELOW PT'S PAIN GOAL 4 or less out of 10  Outcome: Not Progressing 100mg  ultram caused nausea/vomiting, 50mg  was ineffective. vicodin 1 tab q6h ordered, effective. Pt also utilizing ice packs.

## 2013-08-09 NOTE — Progress Notes (Signed)
Subjective/Complaints: Had a rough night. vicodin eventually was effective for pain. Pain is generally "electrical" and shoots down spine to legs.  A 12 point review of systems has been performed and if not noted above is otherwise negative.   Objective: Vital Signs: Blood pressure 124/87, pulse 70, temperature 98.3 F (36.8 C), temperature source Oral, resp. rate 18, height 5\' 3"  (1.6 m), weight 105.098 kg (231 lb 11.2 oz), last menstrual period 03/17/2012, SpO2 97.00%. No results found. No results found for this basename: WBC, HGB, HCT, PLT,  in the last 72 hours  Recent Labs  08/07/13 0640  NA 137  K 3.1*  CL 97  GLUCOSE 89  BUN 8  CREATININE 0.64  CALCIUM 8.8   CBG (last 3)   Recent Labs  08/08/13 1635 08/08/13 2017 08/09/13 0741  GLUCAP 113* 118* 101*    Wt Readings from Last 3 Encounters:  08/05/13 105.098 kg (231 lb 11.2 oz)  08/01/13 102.2 kg (225 lb 5 oz)  07/09/13 102.059 kg (225 lb)    Physical Exam:  Constitutional: She is oriented to person, place, and time. She appears well-developed and well-nourished. obese  HENT:  Head: Normocephalic and atraumatic.  Eyes: Conjunctivae are normal. Pupils are equal, round, and reactive to light.  Neck: Normal range of motion. Neck supple.  Cardiovascular: Normal rate and regular rhythm.  No murmur heard.  Pulmonary/Chest: Effort normal and breath sounds normal. No respiratory distress.  Abdominal: Soft. Bowel sounds are normal. She exhibits no distension. There is no tenderness.  Neurological: She is alert and oriented to person, place, and time.  Decreased visual acuity, with mild diplopia . She exhibits normal muscle tone. RUE is 4/5 deltoid, biceps, triceps, 4 to 4+ HI. LUE is 4+ to 5/5 in deltoid, biceps, triceps, HI. RLE is 1-2/5 HF, KE and 1+ ankle. LLE is 2+ to 3/5 HF, KE and 1+ to 2/5 left ankle. Decreased pp/lt both legs to thigh and bilateral hands to wrist. Cognitively she displayed reasonable insight and  awareness. Attention was good. She is quite alert.  Cerebellar testing finger-nose-finger normal in the upper and lower limbs  Skin: Skin is warm and dry. No erythema.    Assessment/Plan: 1. Functional deficits secondary to MS exacerbation which require 3+ hours per day of interdisciplinary therapy in a comprehensive inpatient rehab setting. Physiatrist is providing close team supervision and 24 hour management of active medical problems listed below. Physiatrist and rehab team continue to assess barriers to discharge/monitor patient progress toward functional and medical goals. FIM: FIM - Bathing Bathing Steps Patient Completed: Chest;Right Arm;Left Arm;Abdomen;Front perineal area;Buttocks;Right upper leg;Left upper leg;Right lower leg (including foot);Left lower leg (including foot) Bathing: 6: Assistive device (Comment) (LH sponge, lateral leans for buttocks)  FIM - Upper Body Dressing/Undressing Upper body dressing/undressing steps patient completed: Thread/unthread right sleeve of pullover shirt/dresss;Thread/unthread left sleeve of pullover shirt/dress;Put head through opening of pull over shirt/dress Upper body dressing/undressing: 4: Min-Patient completed 75 plus % of tasks FIM - Lower Body Dressing/Undressing Lower body dressing/undressing steps patient completed: Pull pants up/down;Don/Doff left sock Lower body dressing/undressing: 1: Total-Patient completed less than 25% of tasks  FIM - Toileting Toileting steps completed by patient: Adjust clothing prior to toileting;Performs perineal hygiene;Adjust clothing after toileting Toileting: 4: Steadying assist  FIM - Diplomatic Services operational officer Devices: Bedside commode Toilet Transfers: 4-To toilet/BSC: Min A (steadying Pt. > 75%);4-From toilet/BSC: Min A (steadying Pt. > 75%)  FIM - Bed/Chair Transfer Bed/Chair Transfer Assistive Devices: Bed rails;Arm rests;HOB  elevated Bed/Chair Transfer: 5: Supine > Sit:  Supervision (verbal cues/safety issues);4: Bed > Chair or W/C: Min A (steadying Pt. > 75%);4: Chair or W/C > Bed: Min A (steadying Pt. > 75%)  FIM - Locomotion: Wheelchair Distance: 5 Locomotion: Wheelchair: 1: Travels less than 50 ft with maximal assistance (Pt: 25 - 49%) FIM - Locomotion: Ambulation Locomotion: Ambulation Assistive Devices: Designer, industrial/product Ambulation/Gait Assistance: 3: Mod assist Locomotion: Ambulation: 1: Travels less than 50 ft with moderate assistance (Pt: 50 - 74%)  Comprehension Comprehension Mode: Auditory Comprehension: 5-Follows basic conversation/direction: With extra time/assistive device  Expression Expression Mode: Verbal Expression: 6-Expresses complex ideas: With extra time/assistive device  Social Interaction Social Interaction: 5-Interacts appropriately 90% of the time - Needs monitoring or encouragement for participation or interaction.  Problem Solving Problem Solving: 5-Solves basic problems: With no assist  Memory Memory: 6-More than reasonable amt of time  Medical Problem List and Plan:  1. DVT Prophylaxis/Anticoagulation: Pharmaceutical: Lovenox  2. Pain Management: tylenol prn often doesn't cover pain  -increase gabapentin to 400mg  tid  -use hydrocodone  for breakthrough pain. Can have tramadol available as well.  3. Mood: Provide ego support. LCSW to follow for evaluation.  4. Neuropsych: This patient is capable of making decisions on his own behalf.  5. DM type 2: Monitor with AC/HS cbg checks. Titrated metformin to 1000 mg bid. Will continue meal coverage for now. Sugars showing signs of improvement 6. Polyneuropathy: Will continue neurontin as above.  7. Insomnia: Will try trazodone prn.  8. Thrush-nystatin suspension  LOS (Days) 4 A FACE TO FACE EVALUATION WAS PERFORMED  Kandice Schmelter T 08/09/2013 8:33 AM

## 2013-08-10 ENCOUNTER — Inpatient Hospital Stay (HOSPITAL_COMMUNITY): Payer: No Typology Code available for payment source | Admitting: Physical Therapy

## 2013-08-10 ENCOUNTER — Inpatient Hospital Stay (HOSPITAL_COMMUNITY): Payer: No Typology Code available for payment source

## 2013-08-10 DIAGNOSIS — G35 Multiple sclerosis: Secondary | ICD-10-CM

## 2013-08-10 LAB — GLUCOSE, CAPILLARY
Glucose-Capillary: 95 mg/dL (ref 70–99)
Glucose-Capillary: 134 mg/dL — ABNORMAL HIGH (ref 70–99)
Glucose-Capillary: 117 mg/dL — ABNORMAL HIGH (ref 70–99)
Glucose-Capillary: 126 mg/dL — ABNORMAL HIGH (ref 70–99)

## 2013-08-10 MED ORDER — DICLOFENAC SODIUM 1 % TD GEL
2.0000 g | Freq: Four times a day (QID) | TRANSDERMAL | Status: DC
  Administered 2013-08-10 – 2013-08-18 (×33): 2 g via TOPICAL
  Filled 2013-08-10 (×2): qty 100

## 2013-08-10 NOTE — Progress Notes (Signed)
Speech Language Pathology Daily Session Note  Patient Details  Name: [name] MRN: [id number] Date of Birth: [birth year]  Today's Date: 08/10/2013 Time: 0930-1000 Time Calculation (min): 30 min  Short Term Goals: Week 1: SLP Short Term Goal 1 (Week 1): Pt will demonstrate complex problem solving with functional and familiar tasks with supervision verbal cues.  SLP Short Term Goal 2 (Week 1): Pt will utilize external memory aids to recall new, daily information with Mod I.  SLP Short Term Goal 3 (Week 1): Pt will utilize visual aids/cues to increase organization of functional information with supervision verbal cues.   Skilled Therapeutic Interventions: Treatment focused on cognitive goals. SLP facilitated session with education regarding compensatory strategies to maximize cognitive skills. Pt participated in money management activity with Min verbal cues from SLP. As the session progressed, pt expressed increasing fatigue, confusion, and frustration. SLP offered words of encouragement, emphasizing the accuracy of her work. SLP reiterated compensatory strategies to use, including additional time, checking her work, and decreasing distractions. Continue plan of care.   FIM:  Comprehension Comprehension Mode: Auditory Comprehension: 5-Understands complex 90% of the time/Cues < 10% of the time Expression Expression Mode: Verbal Expression: 5-Expresses complex 90% of the time/cues < 10% of the time Social Interaction Social Interaction: 7-Interacts appropriately with others - No medications needed. Problem Solving Problem Solving: 5-Solves complex 90% of the time/cues < 10% of the time Memory Memory: 6-More than reasonable amt of time FIM - Eating Eating Activity: 7: Complete independence:no helper  Pain Pain Assessment Pain Assessment: 0-10 Pain Score: 7  Pain Type: Neuropathic pain Pain Location: Back Pain Orientation: Right;Lower Pain Descriptors / Indicators: Sharp Pain  Onset: Sudden Patients Stated Pain Goal: 0 Pain Intervention(s): Other (Comment) (declined intervention; just received medication) Multiple Pain Sites: No  Therapy/Group: Individual Therapy  [name] 08/10/2013, 12:14 PM  [name], M.A. CCC-SLP [phone]

## 2013-08-10 NOTE — Plan of Care (Signed)
Problem: RH PAIN MANAGEMENT Goal: RH STG PAIN MANAGED AT OR BELOW PT'S PAIN GOAL 4 or less out of 10  Outcome: Not Progressing Patients pain rate 7-9. adm

## 2013-08-10 NOTE — Progress Notes (Addendum)
Subjective/Complaints: A bout of nausea yesterday. Pain a little better.  A 12 point review of systems has been performed and if not noted above is otherwise negative.   Objective: Vital Signs: Blood pressure 110/73, pulse 76, temperature 98.1 F (36.7 C), temperature source Oral, resp. rate 16, height 5\' 3"  (1.6 m), weight 105.098 kg (231 lb 11.2 oz), last menstrual period 03/17/2012, SpO2 96.00%. No results found. No results found for this basename: WBC, HGB, HCT, PLT,  in the last 72 hours No results found for this basename: NA, K, CL, CO, GLUCOSE, BUN, CREATININE, CALCIUM,  in the last 72 hours CBG (last 3)   Recent Labs  08/09/13 1604 08/09/13 2207 08/10/13 0715  GLUCAP 104* 124* 95    Wt Readings from Last 3 Encounters:  08/05/13 105.098 kg (231 lb 11.2 oz)  08/01/13 102.2 kg (225 lb 5 oz)  07/09/13 102.059 kg (225 lb)    Physical Exam:  Constitutional: She is oriented to person, place, and time. She appears well-developed and well-nourished. obese  HENT:  Head: Normocephalic and atraumatic.  Eyes: Conjunctivae are normal. Pupils are equal, round, and reactive to light.  Neck: Normal range of motion. Neck supple.  Cardiovascular: Normal rate and regular rhythm.  No murmur heard.  Pulmonary/Chest: Effort normal and breath sounds normal. No respiratory distress.  Abdominal: Soft. Bowel sounds are normal. She exhibits no distension. There is no tenderness.  Neurological: She is alert and oriented to person, place, and time.  Decreased visual acuity, with mild diplopia . She exhibits normal muscle tone. RUE is 4/5 deltoid, biceps, triceps, 4 to 4+ HI. LUE is 4+ to 5/5 in deltoid, biceps, triceps, HI. RLE is 1-2/5 HF, KE and 1+ to 2 ankle. LLE is 2+ to 3/5 HF, KE and 2+ to 3/5 left ankle. Decreased pp/lt both legs to thigh and bilateral hands to wrist. Cognitively she displayed reasonable insight and awareness. Attention was good. She is quite alert.  Cerebellar testing  finger-nose-finger normal in the upper and lower limbs  Skin: Skin is warm and dry. No erythema.    Assessment/Plan: 1. Functional deficits secondary to MS exacerbation which require 3+ hours per day of interdisciplinary therapy in a comprehensive inpatient rehab setting. Physiatrist is providing close team supervision and 24 hour management of active medical problems listed below. Physiatrist and rehab team continue to assess barriers to discharge/monitor patient progress toward functional and medical goals. FIM: FIM - Bathing Bathing Steps Patient Completed: Chest;Right Arm;Left Arm;Abdomen;Front perineal area;Buttocks;Right upper leg;Left upper leg;Right lower leg (including foot);Left lower leg (including foot) Bathing: 6: Assistive device (Comment) (LH sponge, lateral leans for buttocks)  FIM - Upper Body Dressing/Undressing Upper body dressing/undressing steps patient completed: Thread/unthread right sleeve of pullover shirt/dresss;Thread/unthread left sleeve of pullover shirt/dress;Put head through opening of pull over shirt/dress Upper body dressing/undressing: 4: Min-Patient completed 75 plus % of tasks FIM - Lower Body Dressing/Undressing Lower body dressing/undressing steps patient completed: Pull pants up/down;Don/Doff left sock Lower body dressing/undressing: 1: Total-Patient completed less than 25% of tasks  FIM - Toileting Toileting steps completed by patient: Adjust clothing prior to toileting;Performs perineal hygiene;Adjust clothing after toileting Toileting: 4: Steadying assist  FIM - Diplomatic Services operational officer Devices: Bedside commode Toilet Transfers: 4-To toilet/BSC: Min A (steadying Pt. > 75%);4-From toilet/BSC: Min A (steadying Pt. > 75%)  FIM - Bed/Chair Transfer Bed/Chair Transfer Assistive Devices: Bed rails;Arm rests Bed/Chair Transfer: 4: Bed > Chair or W/C: Min A (steadying Pt. > 75%);4: Chair or W/C >  Bed: Min A (steadying Pt. >  75%)  FIM - Locomotion: Wheelchair Distance: 5 Locomotion: Wheelchair: 1: Travels less than 50 ft with maximal assistance (Pt: 25 - 49%) FIM - Locomotion: Ambulation Locomotion: Ambulation Assistive Devices: Designer, industrial/product Ambulation/Gait Assistance: 3: Mod assist Locomotion: Ambulation: 1: Travels less than 50 ft with moderate assistance (Pt: 50 - 74%)  Comprehension Comprehension Mode: Auditory Comprehension: 5-Follows basic conversation/direction: With no assist  Expression Expression Mode: Verbal Expression: 6-Expresses complex ideas: With extra time/assistive device  Social Interaction Social Interaction: 5-Interacts appropriately 90% of the time - Needs monitoring or encouragement for participation or interaction.  Problem Solving Problem Solving: 5-Solves complex 90% of the time/cues < 10% of the time  Memory Memory: 6-More than reasonable amt of time  Medical Problem List and Plan:  1. DVT Prophylaxis/Anticoagulation: Pharmaceutical: Lovenox  2. Pain Management: tylenol prn often doesn't cover pain  -increase gabapentin to 400mg  tid  -use hydrocodone  for breakthrough pain. Can have tramadol available as well.  3. Mood: Provide ego support. LCSW to follow for evaluation.  4. Neuropsych: This patient is capable of making decisions on his own behalf.  5. DM type 2: Monitor with AC/HS cbg checks. Metformin at 500 mg bid. Will continue meal coverage for now. Sugars improved 6. Polyneuropathy: Will continue neurontin as above.  7. Insomnia: Will try trazodone prn.     LOS (Days) 5 A FACE TO FACE EVALUATION WAS PERFORMED  Adelyne Marchese T 08/10/2013 8:12 AM

## 2013-08-10 NOTE — Progress Notes (Signed)
Physical Therapy Session Note  Patient Details  Name: [name] MRN: [id number] Date of Birth: [birth year]  Today's Date: 08/10/2013 Time: 1340-1420 Time Calculation (min): 40 min  Short Term Goals: Week 1:  PT Short Term Goal 1 (Week 1): Pt to perform bed mobility w/ min A PT Short Term Goal 2 (Week 1): Pt to perform transfers w/ min guard assist PT Short Term Goal 3 (Week 1): Pt to propel w/c x50' w/ min A PT Short Term Goal 4 (Week 1): Pt to ambulate 30' w/ RW and min A  PT Short Term Goal 5 (Week 1): Pt to negotiate up/down 1 curb step w/ RW and mod A   Skilled Therapeutic Interventions/Progress Updates:    1:1. RN in at start of tx session to provide meds. Pt able to perform SPT bed>w/c w/ use of arm rests and (S). Pt able to propel w/c x60' w/ B UE and (S), pt req increased time for completion 2/2 poor grip strength (R weaker than L). Pt able to amb 10' and then 8' w/ RW, min guard and w/c follow. Gait characterized by increased ability to achieve heel first contact and demonstrate knee flexion on L vs. R. Pt will advance R LE w/ compensatory lean to L side and lack of knee bend/heel first contact if not cued. Pt w/ good tolerance to pre-stair negotiation activity of tapping B LE on to 3" step w/ RW. Pt benefits from short, but frequent rest breaks throughout tx session. Pt in w/c at end of tx session w/ all needs in reach and mother in room.   Therapy Documentation Precautions:  Precautions Precautions: Fall Precaution Comments: LE's "give way" per patient. Restrictions Weight Bearing Restrictions: No  See FIM for current functional status  Therapy/Group: Individual Therapy  Denzil Hughes 08/10/2013, 4:07 PM

## 2013-08-10 NOTE — Progress Notes (Signed)
Physical Therapy Note  Patient Details  Name: [name] MRN: [id number] Date of Birth: 08/29/1976 Today's Date: 08/10/2013  [id number] (30 minutes) individual Pain: no reported pain Focus of treatment: Therapeutic exercise focused on bilateral LE strengthening/ activity tolerance Treatment: Transfers wc >< Nustep RW min to SBA; Nustep Level 3 X 10 minutes with one rest break; alternate stepping up/down 6 inch step for concentric/ eccentric quad strengthening 2 x 3 reps; pt reports c/o  dizziness/vertigo. Returned to room , transferred to bed min assist stand/turn; nurse notified.   Acquanetta Cabanilla,JIM 08/10/2013, 4:02 PM

## 2013-08-10 NOTE — Progress Notes (Signed)
Occupational Therapy Session Note  Patient Details  Name: [name] MRN: [id number] Date of Birth: 02/22/1976  Today's Date: 08/10/2013 Time: 0830-0927 Time Calculation (min): 57 min  Short Term Goals: Week 1:  OT Short Term Goal 1 (Week 1): Patient will complete bathing, sitting and standing, uisng AE prn and mod assist. OT Short Term Goal 2 (Week 1): Patient will demonstrate improved use of right UE function as evidenced by performance of ADL using right hand at diminished level OT Short Term Goal 3 (Week 1): Patient will complete tub/shower transfer using tub bench with mod A. OT Short Term Goal 4 (Week 1): Patient will demonstrate ability to tolerate 60 min of ADL with only 1 rest break. OT Short Term Goal 5 (Week 1): Patient will prepare 1 simple meal at w/c levell using AE prn, with min assist.  Skilled Therapeutic Interventions/Progress Updates: ADL-retraining with emphasis on improved use of R-UE, use of AE, transfers, activity tolerance.   Patient able to complete bed mobility and transfers, off/on to bed and tub bench with only supervision assist this date, using stand pivot and both hands to grab rails.   Patient completed showering unassisted using AE for lower legs and feet.   Patient returned to w/c and dressed, upper and lower body (without shoes this date) sitting and standing with supervision assist, again using both hands to grasp and pull close-fitting (spandex) clothing, as needed.   Patient requested rest break before grooming at sink but reported pain at lower back, escalating over time.   RN called for pain meds; patient remained in bed but initiated knitting to continue improvement with functional use of right hand.   Patient reports benefit with hand function from weekend activities and use of thera-putty exercises, daily.    Therapy Documentation Precautions:  Precautions Precautions: Fall Precaution Comments: LE's "give way" per patient. Restrictions Weight Bearing  Restrictions: No  Pain: Pain Assessment Pain Assessment: 0-10 Pain Score: 7  Pain Type: Neuropathic pain Pain Location: Back Pain Orientation: Right;Lower Pain Descriptors / Indicators: Sharp Pain Onset: Sudden Patients Stated Pain Goal: 0 Pain Intervention(s): Medication (See eMAR) Multiple Pain Sites: No  See FIM for current functional status  Therapy/Group: Individual Therapy  Second session: Time: 1432-1500 Time Calculation (min): 28 min  Pain Assessment: No report of pain  Skilled Therapeutic Interventions: Therapeutic activities with emphasis on functional use of R-UE during ADL, dynamic standing balance, pain/symptom management, and HEP.   Patient able to stand at sink with supervision assist to brush her teeth using right hand and foam tubing on toothbrush.   Patient completed task but reported discomfort at bil LE as limiting standing tolerance to 5 minutes before recovering to w/c.   Patient progressing well through hand exercises and requested upgrade to thera-putty for left hand (from tan to yellow grade, provided).   Patient reports her awareness of her improvements but seeks to challenge herself with attempting to prepare a light meal (cutting fruit for fruit salad) and improving her endurance to accomplish light housekeeping tasks (laundry or bed making).   Patient reports mild anterior compartment pain at right forearm after completing ADL and hand exercises; OT educated patient on use of massage and topical analgesic (RN notified regarding requesting for analgesic balm).  See FIM for current functional status  Therapy/Group: Individual Therapy  Georgeanne Nim 08/10/2013, 9:28 AM

## 2013-08-11 ENCOUNTER — Inpatient Hospital Stay (HOSPITAL_COMMUNITY): Payer: No Typology Code available for payment source

## 2013-08-11 LAB — GLUCOSE, CAPILLARY
Glucose-Capillary: 107 mg/dL — ABNORMAL HIGH (ref 70–99)
Glucose-Capillary: 132 mg/dL — ABNORMAL HIGH (ref 70–99)
Glucose-Capillary: 127 mg/dL — ABNORMAL HIGH (ref 70–99)
Glucose-Capillary: 100 mg/dL — ABNORMAL HIGH (ref 70–99)

## 2013-08-11 MED ORDER — SENNOSIDES-DOCUSATE SODIUM 8.6-50 MG PO TABS
2.0000 | ORAL_TABLET | Freq: Every day | ORAL | Status: DC
Start: 2013-08-11 — End: 2013-08-13
  Administered 2013-08-11 – 2013-08-12 (×2): 2 via ORAL
  Filled 2013-08-11 (×2): qty 2

## 2013-08-11 NOTE — Progress Notes (Signed)
Occupational Therapy Session Note  Patient Details  Name: [name] MRN: [id number] Date of Birth: [birth year]  Today's Date: 08/11/2013 Time: 1000-1056 Time Calculation (min): 56 min  Short Term Goals: Week 1:  OT Short Term Goal 1 (Week 1): Patient will complete bathing, sitting and standing, uisng AE prn and mod assist. OT Short Term Goal 2 (Week 1): Patient will demonstrate improved use of right UE function as evidenced by performance of ADL using right hand at diminished level OT Short Term Goal 3 (Week 1): Patient will complete tub/shower transfer using tub bench with mod A. OT Short Term Goal 4 (Week 1): Patient will demonstrate ability to tolerate 60 min of ADL with only 1 rest break. OT Short Term Goal 5 (Week 1): Patient will prepare 1 simple meal at w/c levell using AE prn, with min assist.  Skilled Therapeutic Interventions/Progress Updates: ADL-retraining with emphasis on compensation/adaptative bathing and dressing, improved activity tolerance, energy conservation and improved symptom management.  Patient advised to continue to reduce water temp while bathing and discovered this session that water temp below 90 deg F is optimal to reduce relapse of recent symptoms generated after bathing (sharp shooting pain at lower back or right leg).   Patient able to complete bathing and dressing this session, at chair level, standing as needed to pull up pants and perform standing pivot transfers in/out of bed and on/off tub bench in 45 minutes with only 1 rest break.    Therapy Documentation Precautions:  Precautions Precautions: Fall Precaution Comments: LE's "give way" per patient. Restrictions Weight Bearing Restrictions: No General:   Vital Signs: Therapy Vitals BP: 116/77 mmHg Patient Position, if appropriate: Standing (after amb x10') Pain: Pain Assessment Pain Assessment: 0-10 Pain Score: 4  Pain Type: Chronic pain Pain Location: Leg Pain Orientation: Right Pain  Descriptors / Indicators: Shooting Pain Frequency: Intermittent Pain Onset: With Activity Pain Intervention(s): Medication (See eMAR);RN made aware  See FIM for current functional status  Therapy/Group: Individual Therapy  Second session: Time: 1330-1415 Time Calculation (min):  45 min  Pain Assessment:  3/10  Skilled Therapeutic Interventions: Therapeutic activity with emphasis on preparations/orientation to perform selected iADL activity: cooking a light meal, on w/c and RW management in kitchen environment, on use of adapted cookware, and patient education on method/modifications to kitchen spaces to improve efficiency and reduce risk for burn or fall injuries.   Patient selected meal of chicken Parmesan for meal on 08/13/13, completing recipe and ingredients assignment prior to session, as directed.   Patient identified use of small rolling cart as most beneficial for her new home and use of large grip cooking utensil to reduce UE tension/weakness while cooking at w/c level.    See FIM for current functional status  Therapy/Group: Individual Therapy  Georgeanne Nim 08/11/2013, 11:00 AM

## 2013-08-11 NOTE — Progress Notes (Signed)
Subjective/Complaints: Had her best day. Increased therapy tolerance but eventually hit a "wall". Pain is better overall.  A 12 point review of systems has been performed and if not noted above is otherwise negative.   Objective: Vital Signs: Blood pressure 105/70, pulse 85, temperature 97.6 F (36.4 C), temperature source Oral, resp. rate 18, height 5\' 3"  (1.6 m), weight 105.098 kg (231 lb 11.2 oz), last menstrual period 03/17/2012, SpO2 95.00%. No results found. No results found for this basename: WBC, HGB, HCT, PLT,  in the last 72 hours No results found for this basename: NA, K, CL, CO, GLUCOSE, BUN, CREATININE, CALCIUM,  in the last 72 hours CBG (last 3)   Recent Labs  08/10/13 1641 08/10/13 2046 08/11/13 0723  GLUCAP 117* 126* 107*    Wt Readings from Last 3 Encounters:  08/05/13 105.098 kg (231 lb 11.2 oz)  08/01/13 102.2 kg (225 lb 5 oz)  07/09/13 102.059 kg (225 lb)    Physical Exam:  Constitutional: She is oriented to person, place, and time. She appears well-developed and well-nourished. obese  HENT:  Head: Normocephalic and atraumatic.  Eyes: Conjunctivae are normal. Pupils are equal, round, and reactive to light.  Neck: Normal range of motion. Neck supple.  Cardiovascular: Normal rate and regular rhythm.  No murmur heard.  Pulmonary/Chest: Effort normal and breath sounds normal. No respiratory distress.  Abdominal: Soft. Bowel sounds are normal. She exhibits no distension. There is no tenderness.  Neurological: She is alert and oriented to person, place, and time.  Decreased visual acuity, with mild diplopia . She exhibits normal muscle tone. RUE is 4/5 deltoid, biceps, triceps, 4 to 4+ HI. LUE is 4+ to 5/5 in deltoid, biceps, triceps, HI. RLE is 1-2/5 HF, KE and 1+ to 2 ankle. LLE is 2+ to 3/5 HF, KE and 2+ to 3/5 left ankle. Decreased pp/lt both legs to thigh and bilateral hands to wrist. Cognitively she displayed reasonable insight and awareness. Attention was  good. She is quite alert.  Cerebellar testing finger-nose-finger normal in the upper and lower limbs  Skin: Skin is warm and dry. No erythema.    Assessment/Plan: 1. Functional deficits secondary to MS exacerbation which require 3+ hours per day of interdisciplinary therapy in a comprehensive inpatient rehab setting. Physiatrist is providing close team supervision and 24 hour management of active medical problems listed below. Physiatrist and rehab team continue to assess barriers to discharge/monitor patient progress toward functional and medical goals. FIM: FIM - Bathing Bathing Steps Patient Completed: Chest;Right Arm;Left Arm;Abdomen;Front perineal area;Buttocks;Right upper leg;Left upper leg;Right lower leg (including foot);Left lower leg (including foot) Bathing: 0: Activity did not occur  FIM - Upper Body Dressing/Undressing Upper body dressing/undressing steps patient completed: Thread/unthread right sleeve of pullover shirt/dresss;Thread/unthread left sleeve of pullover shirt/dress;Put head through opening of pull over shirt/dress;Pull shirt over trunk Upper body dressing/undressing: 0: Activity did not occur FIM - Lower Body Dressing/Undressing Lower body dressing/undressing steps patient completed: Thread/unthread right underwear leg;Thread/unthread left underwear leg;Pull underwear up/down;Thread/unthread right pants leg;Thread/unthread left pants leg;Pull pants up/down;Don/Doff left sock;Don/Doff right sock Lower body dressing/undressing: 0: Activity did not occur  FIM - Toileting Toileting steps completed by patient: Adjust clothing prior to toileting;Performs perineal hygiene;Adjust clothing after toileting Toileting Assistive Devices: Grab bar or rail for support Toileting: 4: Steadying assist  FIM - Diplomatic Services operational officer Devices: Grab bars Toilet Transfers: 4-To toilet/BSC: Min A (steadying Pt. > 75%);4-From toilet/BSC: Min A (steadying Pt. >  75%)  FIM - Bed/Chair Transfer  Bed/Chair Transfer Assistive Devices: Arm rests Bed/Chair Transfer: 5: Bed > Chair or W/C: Supervision (verbal cues/safety issues)  FIM - Locomotion: Wheelchair Distance: 5 Locomotion: Wheelchair: 0: Activity did not occur FIM - Locomotion: Ambulation Locomotion: Ambulation Assistive Devices: Designer, industrial/product Ambulation/Gait Assistance: 4: Min guard Locomotion: Ambulation: 0: Activity did not occur  Comprehension Comprehension Mode: Auditory Comprehension: 5-Understands complex 90% of the time/Cues < 10% of the time  Expression Expression Mode: Verbal Expression: 5-Expresses basic 90% of the time/requires cueing < 10% of the time.  Social Interaction Social Interaction: 7-Interacts appropriately with others - No medications needed.  Problem Solving Problem Solving: 5-Solves basic 90% of the time/requires cueing < 10% of the time  Memory Memory: 6-More than reasonable amt of time  Medical Problem List and Plan:  1. DVT Prophylaxis/Anticoagulation: Pharmaceutical: Lovenox  2. Pain Management: tylenol prn often doesn't cover pain  -increased gabapentin to 400mg  tid  -use hydrocodone  for breakthrough pain. Can have tramadol available as well.  3. Mood: Provide ego support. LCSW to follow for evaluation.  4. Neuropsych: This patient is capable of making decisions on his own behalf.  5. DM type 2: Monitor with AC/HS cbg checks. Metformin at 500 mg bid. Will continue meal coverage for now. Sugars improved 6. Polyneuropathy: Will continue neurontin as above.  7. Insomnia: Will try trazodone prn.  8. Neurogenic bowel---increase senokot s to 2 tabs at night    LOS (Days) 6 A FACE TO FACE EVALUATION WAS PERFORMED  SWARTZ,ZACHARY T 08/11/2013 8:30 AM

## 2013-08-11 NOTE — Progress Notes (Signed)
[facility] Individual Statement of Services  Patient Name:  Leianne Callins  Date:  08/11/2013  Welcome to the [facility].  Our goal is to provide you with an individualized program based on your diagnosis and situation, designed to meet your specific needs.  With this comprehensive rehabilitation program, you will be expected to participate in at least 3 hours of rehabilitation therapies Monday-Friday, with modified therapy programming on the weekends.  Your rehabilitation program will include the following services:  Physical Therapy (PT), Occupational Therapy (OT), Speech Therapy (ST), 24 hour per day rehabilitation nursing, Therapeutic Recreaction (TR), Neuropsychology, Case Management (Social Worker), Rehabilitation Medicine, Nutrition Services and Pharmacy Services  Weekly team conferences will be held on Tuesdays to discuss your progress.  Your Social Worker will talk with you frequently to get your input and to update you on team discussions.  Team conferences with you and your family in attendance may also be held.  Expected length of stay: 2 weeks  Overall anticipated outcome: supervision to min assist  Depending on your progress and recovery, your program may change. Your Social Worker will coordinate services and will keep you informed of any changes. Your Social Worker's name and contact numbers are listed  below.  The following services may also be recommended but are not provided by the [facility]:   Driving Evaluations  Home Health Rehabiltiation Services  Outpatient Rehabilitation Services  Vocational Rehabilitation   Arrangements will be made to provide these services after discharge if needed.  Arrangements include referral to agencies that provide these services.  Your insurance has been verified to be:  None [employer] Your primary doctor is:  Dr. Artist Pais  Pertinent information will be shared with your doctor  and your insurance company.  Social Worker:  [place], [place] [phone] or ([phone]   Information discussed with and copy given to patient by: Amada Jupiter, 08/07/2013, 4:00 PM

## 2013-08-11 NOTE — Progress Notes (Signed)
Speech Language Pathology Daily Session Note  Patient Details  Name: [name] MRN: [id number] Date of Birth: October 25, 1976  Today's Date: 08/11/2013 Time: 0930-1000 Time Calculation (min): 30 min  Short Term Goals: Week 1: SLP Short Term Goal 1 (Week 1): Pt will demonstrate complex problem solving with functional and familiar tasks with supervision verbal cues.  SLP Short Term Goal 2 (Week 1): Pt will utilize external memory aids to recall new, daily information with Mod I.  SLP Short Term Goal 3 (Week 1): Pt will utilize visual aids/cues to increase organization of functional information with supervision verbal cues.   Skilled Therapeutic Interventions: Treatment focused on cognitive goals. SLP facilitated session with Min verbal cues for completion of money management task. With Supervision level verbal cues, pt also created a template to use for managing bills upon return home. Continue plan of care.   FIM:  Comprehension Comprehension Mode: Auditory Comprehension: 5-Understands complex 90% of the time/Cues < 10% of the time Expression Expression Mode: Verbal Expression: 5-Expresses complex 90% of the time/cues < 10% of the time Social Interaction Social Interaction: 7-Interacts appropriately with others - No medications needed. Problem Solving Problem Solving: 5-Solves basic 90% of the time/requires cueing < 10% of the time Memory Memory: 6-More than reasonable amt of time FIM - Eating Eating Activity: 7: Complete independence:no helper  Pain Pain Assessment Pain Assessment: 0-10 Pain Score: 3  Pain Type: Chronic pain Pain Location: Leg Pain Orientation: Mid Pain Radiating Towards: legs Pain Descriptors / Indicators: Aching;Sharp;Shooting Pain Frequency: Intermittent Pain Onset: On-going Patients Stated Pain Goal: 0 Pain Intervention(s): Medication (See eMAR)  Therapy/Group: Individual Therapy  Maxcine Ham 08/11/2013, 1:47 PM  Maxcine Ham, M.A.  CCC-SLP [phone]

## 2013-08-11 NOTE — Progress Notes (Addendum)
Physical Therapy Session Note  Patient Details  Name: [name] MRN: [id number] Date of Birth: 25-Oct-1976  Today's Date: 08/11/2013 Time: 1191-4782 Time Calculation (min): 55 min  Short Term Goals: Week 1:  PT Short Term Goal 1 (Week 1): Pt to perform bed mobility w/ min A PT Short Term Goal 2 (Week 1): Pt to perform transfers w/ min guard assist PT Short Term Goal 3 (Week 1): Pt to propel w/c x50' w/ min A PT Short Term Goal 4 (Week 1): Pt to ambulate 30' w/ RW and min A  PT Short Term Goal 5 (Week 1): Pt to negotiate up/down 1 curb step w/ RW and mod A   Skilled Therapeutic Interventions/Progress Updates:   1:1. Pt sitting in bed attempting to dress lower body upon entry.  Pt req close (S) for standing w/ RW to pull up pants. Pt's w/c modified by adding rubber tubing around R rim for improved grip and ability to propel w/c 2/2 decreased grip strength on R. Pt w/ good tolerance to pre-stair negotiation activities and able to negotiate up/down 1, 4" step 3x w/ B rails and min A. Pt able to propel w/c 60'x2 and 25'x2 this tx session w/ B UE and (S). Pt able to amb 25' and 15'x2 w/ RW and close (S). Pt w/ improved demonstration of knee flexion and heel first contact in R LE during ambulation compared to yesterday. Pt w/ verbal reports of dizziness and blurred vision at end of tx session during standing mobility. BP recorded as 127/91 during sitting and 116/77 in standing after bout of ambulation. Dizziness resolves w/ prolonged rest. Pt able to perform SPT bed<>w/c w/out use of AD and w/ close (S) this tx session. Pt sitting in bed at end of tx session w/ all needs in reach. Pt verbalized understanding to call for assist if needing to get OOB.   Therapy Documentation Precautions:  Precautions Precautions: Fall Precaution Comments: LE's "give way" per patient. Restrictions Weight Bearing Restrictions: No   Vital Signs: Therapy Vitals BP: 116/77 mmHg Patient Position, if appropriate:  Standing (after amb x10')  Pain: Pain Assessment Pain Assessment: No/denies pain  See FIM for current functional status  Therapy/Group: Individual Therapy  Denzil Hughes 08/11/2013, 9:28 AM

## 2013-08-12 ENCOUNTER — Inpatient Hospital Stay (HOSPITAL_COMMUNITY): Payer: No Typology Code available for payment source

## 2013-08-12 ENCOUNTER — Inpatient Hospital Stay (HOSPITAL_COMMUNITY): Payer: No Typology Code available for payment source | Admitting: Rehabilitation

## 2013-08-12 ENCOUNTER — Inpatient Hospital Stay (HOSPITAL_COMMUNITY): Payer: No Typology Code available for payment source | Admitting: Speech Pathology

## 2013-08-12 DIAGNOSIS — G35 Multiple sclerosis: Secondary | ICD-10-CM

## 2013-08-12 LAB — GLUCOSE, CAPILLARY
Glucose-Capillary: 115 mg/dL — ABNORMAL HIGH (ref 70–99)
Glucose-Capillary: 145 mg/dL — ABNORMAL HIGH (ref 70–99)
Glucose-Capillary: 115 mg/dL — ABNORMAL HIGH (ref 70–99)
Glucose-Capillary: 121 mg/dL — ABNORMAL HIGH (ref 70–99)

## 2013-08-12 LAB — BASIC METABOLIC PANEL
BUN: 6 mg/dL (ref 6–23)
CO2: 28 mEq/L (ref 19–32)
Calcium: 9.3 mg/dL (ref 8.4–10.5)
Chloride: 101 mEq/L (ref 96–112)
Creatinine, Ser: 0.66 mg/dL (ref 0.50–1.10)
GFR calc Af Amer: >90 mL/min (ref >90)
GFR calc non Af Amer: >90 mL/min (ref >90)
Glucose, Bld: 110 mg/dL — ABNORMAL HIGH (ref 70–99)
Potassium: 3.7 mEq/L (ref 3.5–5.1)
Sodium: 139 mEq/L (ref 135–145)

## 2013-08-12 MED ORDER — INTERFERON BETA-1A 22 MCG/0.5ML ~~LOC~~ SOLN
44.0000 ug | SUBCUTANEOUS | Status: DC
  Administered 2013-08-12 – 2013-08-17 (×3): 44 ug via SUBCUTANEOUS

## 2013-08-12 MED ORDER — INTERFERON BETA-1A 22 MCG/0.5ML ~~LOC~~ SOLN: 44.0000 ug | SUBCUTANEOUS | Status: DC

## 2013-08-12 MED ORDER — INTERFERON BETA-1A 22 MCG/0.5ML ~~LOC~~ SOLN: 22.0000 ug | SUBCUTANEOUS | Status: DC

## 2013-08-12 MED ORDER — GABAPENTIN 300 MG PO CAPS
600.0000 mg | ORAL_CAPSULE | Freq: Three times a day (TID) | ORAL | Status: DC
  Administered 2013-08-12 – 2013-08-18 (×19): 600 mg via ORAL
  Filled 2013-08-12 (×21): qty 2

## 2013-08-12 NOTE — Progress Notes (Signed)
Subjective/Complaints: Had a rough night. Shooting pains down spine again to legs. Right shoulder better with voltaren gel.  A 12 point review of systems has been performed and if not noted above is otherwise negative.   Objective: Vital Signs: Blood pressure 129/89, pulse 89, temperature 98.1 F (36.7 C), temperature source Oral, resp. rate 17, height 5\' 3"  (1.6 m), weight 105.098 kg (231 lb 11.2 oz), last menstrual period 03/17/2012, SpO2 94.00%. No results found. No results found for this basename: WBC, HGB, HCT, PLT,  in the last 72 hours  Recent Labs  08/12/13 0535  NA 139  K 3.7  CL 101  GLUCOSE 110*  BUN 6  CREATININE 0.66  CALCIUM 9.3   CBG (last 3)   Recent Labs  08/11/13 1636 08/11/13 2122 08/12/13 0726  GLUCAP 127* 100* 115*    Wt Readings from Last 3 Encounters:  08/05/13 105.098 kg (231 lb 11.2 oz)  08/01/13 102.2 kg (225 lb 5 oz)  07/09/13 102.059 kg (225 lb)    Physical Exam:  Constitutional: She is oriented to person, place, and time. She appears well-developed and well-nourished. obese  HENT:  Head: Normocephalic and atraumatic.  Eyes: Conjunctivae are normal. Pupils are equal, round, and reactive to light.  Neck: Normal range of motion. Neck supple.  Cardiovascular: Normal rate and regular rhythm.  No murmur heard.  Pulmonary/Chest: Effort normal and breath sounds normal. No respiratory distress.  Abdominal: Soft. Bowel sounds are normal. She exhibits no distension. There is no tenderness.  Neurological: She is alert and oriented to person, place, and time.  Decreased visual acuity, with mild diplopia . She exhibits normal muscle tone. RUE is 4/5 deltoid, biceps, triceps, 4 to 4+ HI. LUE is 4+ to 5/5 in deltoid, biceps, triceps, HI. RLE is 1-2/5 HF, 2- KE and 1+ to 2 ankle. LLE is 2+ to 3/5 HF, KE and 2+ to 3/5 left ankle. Decreased pp/lt both legs to thigh and bilateral hands to wrist. Cognitively she displayed reasonable insight and awareness.  Attention was good. She is quite alert.  Cerebellar testing finger-nose-finger normal in the upper and lower limbs  Skin: Skin is warm and dry. No erythema.    Assessment/Plan: 1. Functional deficits secondary to MS exacerbation which require 3+ hours per day of interdisciplinary therapy in a comprehensive inpatient rehab setting. Physiatrist is providing close team supervision and 24 hour management of active medical problems listed below. Physiatrist and rehab team continue to assess barriers to discharge/monitor patient progress toward functional and medical goals. FIM: FIM - Bathing Bathing Steps Patient Completed: Chest;Right Arm;Left Arm;Abdomen;Front perineal area;Buttocks;Right upper leg;Left upper leg;Right lower leg (including foot);Left lower leg (including foot) Bathing: 6: Assistive device (Comment)  FIM - Upper Body Dressing/Undressing Upper body dressing/undressing steps patient completed: Thread/unthread right sleeve of pullover shirt/dresss;Thread/unthread left sleeve of pullover shirt/dress;Put head through opening of pull over shirt/dress;Pull shirt over trunk Upper body dressing/undressing: 5: Supervision: Safety issues/verbal cues FIM - Lower Body Dressing/Undressing Lower body dressing/undressing steps patient completed: Thread/unthread right underwear leg;Thread/unthread left underwear leg;Pull underwear up/down;Thread/unthread right pants leg;Thread/unthread left pants leg;Pull pants up/down;Fasten/unfasten pants;Don/Doff right sock;Don/Doff left sock Lower body dressing/undressing: 5: Supervision: Safety issues/verbal cues  FIM - Toileting Toileting steps completed by patient: Adjust clothing prior to toileting;Performs perineal hygiene;Adjust clothing after toileting Toileting Assistive Devices: Grab bar or rail for support Toileting: 4: Steadying assist  FIM - Diplomatic Services operational officer Devices: Grab bars Toilet Transfers: 4-To toilet/BSC: Min A  (steadying Pt. > 75%);4-From toilet/BSC:  Min A (steadying Pt. > 75%)  FIM - Bed/Chair Transfer Bed/Chair Transfer Assistive Devices: Bed rails;Arm rests Bed/Chair Transfer: 5: Supine > Sit: Supervision (verbal cues/safety issues);5: Sit > Supine: Supervision (verbal cues/safety issues);5: Bed > Chair or W/C: Supervision (verbal cues/safety issues);5: Chair or W/C > Bed: Supervision (verbal cues/safety issues)  FIM - Locomotion: Wheelchair Distance: 5 Locomotion: Wheelchair: 1: Travels less than 50 ft with supervision, cueing or coaxing FIM - Locomotion: Ambulation Locomotion: Ambulation Assistive Devices: Designer, industrial/product Ambulation/Gait Assistance: 5: Supervision Locomotion: Ambulation: 1: Travels less than 50 ft with supervision/safety issues  Comprehension Comprehension Mode: Auditory Comprehension: 5-Understands complex 90% of the time/Cues < 10% of the time  Expression Expression Mode: Verbal Expression: 5-Expresses complex 90% of the time/cues < 10% of the time  Social Interaction Social Interaction: 7-Interacts appropriately with others - No medications needed.  Problem Solving Problem Solving: 5-Solves basic 90% of the time/requires cueing < 10% of the time  Memory Memory: 6-More than reasonable amt of time  Medical Problem List and Plan:  1. DVT Prophylaxis/Anticoagulation: Pharmaceutical: Lovenox  2. Pain Management: tylenol prn often doesn't cover pain  -increase gabapentin to 600mg  q8. Consider TCA  -use hydrocodone  for breakthrough pain. Can have tramadol available as well.  3. Mood: Provide ego support. LCSW to follow for evaluation.  4. Neuropsych: This patient is capable of making decisions on his own behalf.  5. DM type 2: Monitor with AC/HS cbg checks. Metformin at 500 mg bid. Will continue meal coverage for now. Sugars improved 6. Polyneuropathy: Will continue neurontin as above.  7. Insomnia: Will try trazodone prn.  8. Neurogenic bowel---increase senokot  s to 2 tabs at night    LOS (Days) 7 A FACE TO FACE EVALUATION WAS PERFORMED  SWARTZ,ZACHARY T 08/12/2013 8:25 AM

## 2013-08-12 NOTE — Progress Notes (Signed)
Occupational Therapy Session Note  Patient Details  Name: [name] MRN: [id number] Date of Birth: February 22, 1976  Today's Date: 08/12/2013 Time: 5784-6962 Time Calculation (min): 55 min  Short Term Goals: Week 1:  OT Short Term Goal 1 (Week 1): Patient will complete bathing, sitting and standing, uisng AE prn and mod assist. OT Short Term Goal 2 (Week 1): Patient will demonstrate improved use of right UE function as evidenced by performance of ADL using right hand at diminished level OT Short Term Goal 3 (Week 1): Patient will complete tub/shower transfer using tub bench with mod A. OT Short Term Goal 4 (Week 1): Patient will demonstrate ability to tolerate 60 min of ADL with only 1 rest break. OT Short Term Goal 5 (Week 1): Patient will prepare 1 simple meal at w/c levell using AE prn, with min assist.  Skilled Therapeutic Interventions/Progress Updates:  ADL-retraining with emphasis on iADL: homemaking (making a bed), using prinicples of energy conservation, symptom management, and improved activity tolerance.  Patient completed grooming and hair care at sink side prior to treatment and requested continued homemaking tasks for intervention emphasis during this session.  Patient was challenged to complete homemaking, sitting and standing using RW and compensatory strategies as advised.   Patient progressed through activity and learned to make a bed standing briefly, sitting at edge of bed, and using a 4-wheeled stool beside.   Patient completed activity with 1 rest break in 60 minutes.    Therapy Documentation Precautions:  Precautions Precautions: Fall Precaution Comments: LE's "give way" per patient. Restrictions Weight Bearing Restrictions: No  Vital Signs: Therapy Vitals Temp: 98.1 F (36.7 C) Temp src: Oral Pulse Rate: 89 Resp: 17 BP: 129/89 mmHg Oxygen Therapy SpO2: 94 % O2 Device: None (Room air)  Pain: Pain Assessment Pain Assessment: No/denies pain Pain Score:  Asleep  See FIM for current functional status  Therapy/Group: Individual Therapy  Second session: Time: 1300-1345 Time Calculation (min):  45 min  Pain Assessment: No report of pain  Skilled Therapeutic Interventions: Therapeutic activities with emphasis on UE strengthening, endurance, and patient/family ed on falls recovery.   Patient performed 15 min of UE ergometry using SciFit for 15 minutes from level 1.0 to ending with level 5.0, accelerating from 39 RPM to 55 RPM for the last minute of exercise.   Patient responded well to challenge and reports awareness that UE endurance and symptoms are less severe and less disabling than LE symptoms.    Re-assessed grip and pinch strength following arm ergometry as follows:    R = 40 lbs per Jamar hand dyno   L = 39 lbs    Pinch: R key pinch = 15 lbs; L = 15 lbs       Patient cites knitting and thera-putty exercises as instrumental to recovery of grip/pinch strength.  Patient    independently upgraded putty from tan to yellow resistance.   Patient observed demonstration of floor recovery using straight chair and reported interest in family training with her husband following demonstration.  See FIM for current functional status  Therapy/Group: Individual Therapy  Georgeanne Nim 08/12/2013, 9:26 AM

## 2013-08-12 NOTE — Progress Notes (Signed)
Physical Therapy Session Note  Patient Details  Name: [name] MRN: [id number] Date of Birth: 01/19/1976  Today's Date: 08/12/2013 Time: 7829-5621 Time Calculation (min): 49 min  Short Term Goals: Week 1:  PT Short Term Goal 1 (Week 1): Pt to perform bed mobility w/ min A PT Short Term Goal 2 (Week 1): Pt to perform transfers w/ min guard assist PT Short Term Goal 3 (Week 1): Pt to propel w/c x50' w/ min A PT Short Term Goal 4 (Week 1): Pt to ambulate 30' w/ RW and min A  PT Short Term Goal 5 (Week 1): Pt to negotiate up/down 1 curb step w/ RW and mod A   Skilled Therapeutic Interventions/Progress Updates:   Pt received in bed working on knitting when PT arrived.  Motivated to participate in therapy, however requests to be propelled to gym to decrease fatigue.  Performed stand pivot transfers x 4 reps (in room and apt) at supervision level using arm rests as needed to sit.  Min cues for safety and w/c placement.  Performed supine <> sit in apt to better simulate home environment at supervision.  Some difficulty elevating RLE into bed, however she is able to do so without assist by internally rotating leg to get into/out of bed.  Performed car transfer at min assist level for RLE into car with cues for technique of sitting into car first then elevating LEs into car.  Performed 3 (4") steps x 2 reps and 2 (6") x 2 reps (with rest break in between) using L handrail and SPC in R hand.  Performed at min assist level with min cues for utilizing SPC on stairs.  She verbalizes that she really likes using SPC.  Also performed 20' x 2 gait training with use of SPC at min guard to min assist and cues for sequencing/technique with SPC.  Applied some green theraband to L w/c wheel for improved grip at pts request, she feels that she likes theraband better than tubing as she states tubing gets caught in her rings when propelling.  Pt self propelled x 80' back towards room with 2 rest breaks and was assisted  remainder of distance.  Returned to bed with all needs in place.    Therapy Documentation Precautions:  Precautions Precautions: Fall Precaution Comments: LE's "give way" per patient. Restrictions Weight Bearing Restrictions: No General: Amount of Missed PT Time (min): 11 Minutes Missed Time Reason: Patient fatigue   Pain: Pain Assessment Pain Assessment: No/denies pain   Locomotion : Ambulation Ambulation/Gait Assistance: 4: Min guard Wheelchair Mobility Distance: 23' (with two rest breaks)   See FIM for current functional status  Therapy/Group: Individual Therapy  Vista Deck 08/12/2013, 11:09 AM

## 2013-08-12 NOTE — Progress Notes (Signed)
Speech Language Pathology Daily Session Note  Patient Details  Name: [name] MRN: [id number] Date of Birth: 07-Feb-1976  Today's Date: 08/12/2013 Time: 1130-1200 Time Calculation (min): 30 min  Short Term Goals: Week 1: SLP Short Term Goal 1 (Week 1): Pt will demonstrate complex problem solving with functional and familiar tasks with supervision verbal cues.  SLP Short Term Goal 2 (Week 1): Pt will utilize external memory aids to recall new, daily information with Mod I.  SLP Short Term Goal 3 (Week 1): Pt will utilize visual aids/cues to increase organization of functional information with supervision verbal cues.   Skilled Therapeutic Interventions: Treatment focused on cognitive goals. SLP facilitated session with supervision verbal and question cues for creating a list of household tasks that need organizing and ways to organize tasks with focus on increasing recall/carryover and energy conservation. Continue plan of care.   FIM:  Comprehension Comprehension Mode: Auditory Comprehension: 5-Understands complex 90% of the time/Cues < 10% of the time Expression Expression Mode: Verbal Expression: 5-Expresses complex 90% of the time/cues < 10% of the time Social Interaction Social Interaction: 6-Interacts appropriately with others with medication or extra time (anti-anxiety, antidepressant). Problem Solving Problem Solving: 5-Solves basic 90% of the time/requires cueing < 10% of the time Memory Memory: 6-More than reasonable amt of time FIM - Eating Eating Activity: 7: Complete independence:no helper  Pain Pain Assessment Pain Assessment: No/denies pain  Therapy/Group: Individual Therapy  Jacobus Colvin 08/12/2013, 12:14 PM

## 2013-08-12 NOTE — Progress Notes (Signed)
Social Work Patient ID: [name], female   DOB: [birth year], 37 y.o.   MRN: [id number]  Met with patient and mother yesterday to review team conference.  Both aware and agreeable with targeted d/c date 9/17.  Pt all smiles and reports very pleased with her progress.  Denies any concerns about family support at home as her mother is to stay indefinitely.  Have asked that neuropsych eval for coping, however, due to her own expressed concerns about dealing with this chronic dz process.  Continue to follow.  Risha Barretta, LCSW

## 2013-08-12 NOTE — Patient Care Conference (Signed)
Inpatient RehabilitationTeam Conference and Plan of Care Update Date: 08/11/2013   Time: 2:45 PM    Patient Name: [name]      Medical Record Number: [id number]  Date of Birth: 1975-12-24 Sex: Female         Room/Bed: 4W21C/4W21C-01 Payor Info: Payor: MEDICAID POTENTIAL / Plan: MEDICAID POTENTIAL / Product Type: *No Product type* /    Admitting Diagnosis: MS Exacerbation  Admit Date/Time:  08/05/2013  2:30 PM Admission Comments: No comment available   Primary Diagnosis:  Multiple sclerosis exacerbation Principal Problem: Multiple sclerosis exacerbation  Patient Active Problem List   Diagnosis Date Noted  . Exacerbation of multiple sclerosis 08/01/2013  . Multiple sclerosis exacerbation 06/30/2013  . Preoperative examination 02/01/2012  . MIGRAINE HEADACHE 12/15/2010  . SINUSITIS 01/19/2010  . BRONCHITIS 01/12/2010  . Multiple sclerosis 06/16/2009  . ASTHMA, WITH ACUTE EXACERBATION 03/23/2009  . ALLERGIC RHINITIS 02/07/2009  . URINARY FREQUENCY 12/23/2008  . ASTHMATIC BRONCHITIS, ACUTE 12/17/2008  . PARESTHESIA 11/24/2008  . ACUT SUPPRATV OTITIS MEDIA W/O SPONT RUP EARDRUM 09/17/2008  . EPICONDYLITIS 09/02/2008  . SNORING 03/29/2008  . FATIGUE 03/02/2008  . 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 OBESITY, MORBID 02/23/2008  . ABDOMINAL PAIN, GENERALIZED 02/23/2008  . FATTY LIVER DISEASE 01/27/2008  . HYPERLIPIDEMIA 01/19/2008  . LIVER FUNCTION TESTS, ABNORMAL 01/19/2008  . DIABETES MELLITUS, TYPE II 06/27/2007  . HYPERTENSION 06/27/2007  . ASTHMA 06/27/2007  . GERD 06/27/2007    Expected Discharge Date: Expected Discharge Date: 08/19/13  Team Members Present: Physician leading conference: Dr. Faith Rogue Social Worker Present: Amada Jupiter, LCSW Nurse Present: Carlean Purl, RN PT Present: Zerita Boers, PT OT Present: Donzetta Kohut, OT;Jennifer Marlis Edelson, OT SLP Present: Feliberto Gottron, SLP PPS Coordinator present : Tora Duck, RN, CRRN     Current  Status/Progress Goal Weekly Team Focus  Medical   MS exacerbation, pain issues.   pain mgt, education  improve mobility, increase activity tolerance   Bowel/Bladder   continent of bowel and bladder  maintain continence of bowel and bladder  monitor   Swallow/Nutrition/ Hydration             ADL's   Supervision-steadying assist when standing  Min A - Mod I  Increased R-UE function (G/FMC), endurance, symptom managment, dynamic standing balance, functional transfers, functional mobility   Mobility   (S) for bed mobility, sitting balance, w/c propulsion x60'; Min guard to Min A for transfers, amb x10' w/ RW, dynamic standing balance  Goals upgraded. Mod(I): bed mob, w/c propulsion in controlled and home environments; (S): dynamic standing balance, transfers, amb in controlled/home environments x50'; Min A: car t/f, community amb x25' and stair negotiation    Functional endurance, quality and safety during all funcitonal mobility.    Communication             Safety/Cognition/ Behavioral Observations  Min A  Supervision  organization, problem solving    Pain   complained of BLE, and lower back pain managed by PRN pain medicine  keep the pain level < 3  assess for pain and the effectiveness of pain management   Skin   skin intact  no skin breakdown  assess for skin integrity.    Rehab Goals Patient on target to meet rehab goals: Yes *See Care Plan and progress notes for long and short-term goals.  Barriers to Discharge: safety, cognition, pain mgt    Possible Resolutions to Barriers:  supervision, pt/family education    Discharge Planning/Teaching Needs:  home with family (  husband and mother) able to provide a shared 24/7 support - goals upgraded so likely will not require this      Team Discussion:  Good coordination and strength and would expect better movement than she currently shows.  Some c/o "dizziness", however, no BP issues found.  Some nausea as well.  Good gains  overall  Revisions to Treatment Plan:  Add ST to screen cognition   Continued Need for Acute Rehabilitation Level of Care: The patient requires daily medical management by a physician with specialized training in physical medicine and rehabilitation for the following conditions: Daily direction of a multidisciplinary physical rehabilitation program to ensure safe treatment while eliciting the highest outcome that is of practical value to the patient.: Yes Daily medical management of patient stability for increased activity during participation in an intensive rehabilitation regime.: Yes Daily analysis of laboratory values and/or radiology reports with any subsequent need for medication adjustment of medical intervention for : Neurological problems (mood/coping, pain mgt)  Calysta Craigo 08/12/2013, 9:41 AM

## 2013-08-12 NOTE — Progress Notes (Signed)
NUTRITION FOLLOW UP  Intervention:   1.  General healthful diet; encourage intake as needed 2.  Supplements; Discontinue Ensure.  Nutrition Dx:   Inadequate oral intake, ongoing.   Monitor:   1.  Food/Beverage; pt meeting >/=90% estimated needs with tolerance.  Met, continue to monitor 2.  Wt/wt change; monitor trends. Ongoing.  Assessment:   Pt admitted with deconditioning r/t MS exacerbation. PO intake has improved to >75% of meals.  Pt not accepting Ensure Complete.  Supplements not indicated at this time due to improved intake.  Height: Ht Readings from Last 1 Encounters:  08/05/13 5\' 3"  (1.6 m)    Weight Status:   Wt Readings from Last 1 Encounters:  08/05/13 231 lb 11.2 oz (105.098 kg)    Re-estimated needs:  Kcal: 1840-1980 Protein: 75-90g Fluid: ~2.0 L/day  Skin: non-pitting edema, intact  Diet Order: Cardiac   Intake/Output Summary (Last 24 hours) at 08/12/13 1110 Last data filed at 08/12/13 0423  Gross per 24 hour  Intake    480 ml  Output      2 ml  Net    478 ml    Last BM: 9/8   Labs:   Recent Labs Lab 08/06/13 0620 08/07/13 0640 08/12/13 0535  NA 139 137 139  K 3.0* 3.1* 3.7  CL 99 97 101  CO2 31 33* 28  BUN 9 8 6   CREATININE 0.53 0.64 0.66  CALCIUM 8.6 8.8 9.3  GLUCOSE 96 89 110*    CBG (last 3)   Recent Labs  08/11/13 1636 08/11/13 2122 08/12/13 0726  GLUCAP 127* 100* 115*    Scheduled Meds: . baclofen  5 mg Oral TID  . diclofenac sodium  2 g Topical QID  . enoxaparin (LOVENOX) injection  50 mg Subcutaneous Q24H  . feeding supplement  237 mL Oral BID BM  . gabapentin  600 mg Oral Q8H  . insulin aspart  0-15 Units Subcutaneous TID WC  . insulin aspart  0-5 Units Subcutaneous QHS  . interferon beta-1a  8.8 mcg Subcutaneous Q M,W,F  . metFORMIN  500 mg Oral BID WC  . pantoprazole  40 mg Oral Q1200  . potassium chloride  20 mEq Oral BID  . senna-docusate  2 tablet Oral QHS    Continuous Infusions:   Loyce Dys, MS RD LDN Clinical Inpatient Dietitian Pager: [phone] Weekend/After hours pager: [phone]

## 2013-08-13 ENCOUNTER — Inpatient Hospital Stay (HOSPITAL_COMMUNITY): Payer: No Typology Code available for payment source | Admitting: Speech Pathology

## 2013-08-13 ENCOUNTER — Inpatient Hospital Stay (HOSPITAL_COMMUNITY): Payer: No Typology Code available for payment source

## 2013-08-13 LAB — GLUCOSE, CAPILLARY
Glucose-Capillary: 100 mg/dL — ABNORMAL HIGH (ref 70–99)
Glucose-Capillary: 102 mg/dL — ABNORMAL HIGH (ref 70–99)
Glucose-Capillary: 90 mg/dL (ref 70–99)
Glucose-Capillary: 116 mg/dL — ABNORMAL HIGH (ref 70–99)

## 2013-08-13 MED ORDER — POLYETHYLENE GLYCOL 3350 17 G PO PACK
17.0000 g | PACK | Freq: Two times a day (BID) | ORAL | Status: DC
  Administered 2013-08-13 – 2013-08-17 (×9): 17 g via ORAL
  Filled 2013-08-13 (×13): qty 1

## 2013-08-13 NOTE — Plan of Care (Signed)
Problem: Food- and Nutrition-Related Knowledge Deficit (NB-1.1) Goal: Nutrition education Formal process to instruct or train a patient/client in a skill or to impart knowledge to help patients/clients voluntarily manage or modify food choices and eating behavior to maintain or improve health. Outcome: Progressing  RD consulted for nutrition education at pt's request.  Pt states she has been managing her diabetes with diet and exercise and have been able to achieve effective management.  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  She does have questions r/t to MS and diet.  RD explained the role of diet in auto-immune disorders and that absence of evidence to support diet change for MS.  Reviewed pt's questions and discussed the important role of glucose control on healing process and return to normal during MS exacerbation.      Lab Results  Component Value Date    HGBA1C 6.2* 08/02/2013    RD provided "Carbohydrate Counting for People with Diabetes" handout from the Academy of [employer]. Discussed different food groups and their effects on blood sugar, emphasizing carbohydrate-containing foods. Provided list of carbohydrates and recommended serving sizes of common foods.   Discussed importance of controlled and consistent carbohydrate intake throughout the day. Teach back method used.  Expect good compliance.  Body mass index is 41.05 kg/(m^2). Pt meets criteria for morbidly obese based on current BMI.  Current diet order is CHO Modified Medium, patient is consuming approximately >50% of meals at this time with recent improvement. Labs and medications reviewed. No further nutrition interventions warranted at this time. RD contact information provided. RD following pt for poor PO intake on admission.  Will continue to follow.   Loyce Dys, MS RD LDN Clinical Inpatient Dietitian Pager: [phone] Weekend/After hours pager:  [phone]

## 2013-08-13 NOTE — Progress Notes (Signed)
Occupational Therapy Session Note  Patient Details  Name: [name] MRN: [id number] Date of Birth: Nov 06, 1976  Today's Date: 08/13/2013 Time: 0730-0830 Time Calculation (min): 60 min  Short Term Goals: Week 1:  OT Short Term Goal 1 (Week 1): Patient will complete bathing, sitting and standing, uisng AE prn and mod assist. OT Short Term Goal 2 (Week 1): Patient will demonstrate improved use of right UE function as evidenced by performance of ADL using right hand at diminished level OT Short Term Goal 3 (Week 1): Patient will complete tub/shower transfer using tub bench with mod A. OT Short Term Goal 4 (Week 1): Patient will demonstrate ability to tolerate 60 min of ADL with only 1 rest break. OT Short Term Goal 5 (Week 1): Patient will prepare 1 simple meal at w/c levell using AE prn, with min assist.  Skilled Therapeutic Interventions/Progress Updates: ADL-retraining (30 min) with emphasis on energy conservation, standard tub/shower transfer to tub bench at rehab apartment, and reinforcement of compensatory strategies to reduce increasing body core temperature (tepid temp bathing).   Patient able to transfer from RW in/out of standard tub w/tub bench without assistance after one demonstration.  She progressed through efficient seated bathing this session within 10 minutes and dressed seated on BSC to conserve energy.      Therapeutic activity (30 min) with emphasis on iADL (simple meal prep).   Patient used swivel stool, RW, and 4-wheeled cart successfully this session to conserve energy while completing initial preparation of chicken Parm[place] meal by trimming chicken, mixing ingredients for light breading, and breading chicken in prep for pan frying in the afternoon.   Patient demo'd good carry-over of energy conservation.   Therapy Documentation Precautions:  Precautions Precautions: Fall Precaution Comments: LE's "give way" per patient. Restrictions Weight Bearing Restrictions:  No  Vital Signs: Therapy Vitals Temp: 98.5 F (36.9 C) Temp src: Oral Pulse Rate: 88 Resp: 18 BP: 110/76 mmHg Oxygen Therapy SpO2: 95 % O2 Device: None (Room air)  Pain: Pain Assessment Pain Assessment: 0-10 Pain Score: 7  Pain Type: Chronic pain Pain Location: Back Pain Orientation: Mid Pain Descriptors / Indicators: Aching Pain Onset: On-going Patients Stated Pain Goal: 2 Pain Intervention(s): Medication (See eMAR);Environmental changes;Repositioned;Distraction Multiple Pain Sites: No  See FIM for current functional status  Therapy/Group: Individual Therapy  Second session: Time: 1300-1330 Time Calculation (min):  30 min  Pain Assessment: No report of pain  Skilled Therapeutic Interventions: ADL-retraining with emphasis on use of energy conservation during iADL: simple meal preparation.  Patient continued meal prep initiated during morning session by pan frying prepared chicken portions on stovetop instead of backing chicken in oven.   Patient incorporated use of wheeled cart and swivel stool, standing as needed with RW at stovetop, using tongs to prevent burns while turning chicken in saucepan with thin layer of heated cooking oil and thin strips of chicken.  Patient reported that learning to analyze task and breaking meal prep into two sessions allowed her necessary recovery from fatigue of standing.  Patient stated that she would likely continue revising and simplifying her recipes to limit cooking time to between 15-30 minutes.   OT advised patient on use of delegation of heavy kitchen tasks such as clean-up and management of heavier jars and groceries to family members.  Patient reported symptoms of fatigue from standing but expressed satisfaction with her performance of activity.  See FIM for current functional status  Therapy/Group: Individual Therapy   Georgeanne Nim 08/13/2013, 8:49 AM

## 2013-08-13 NOTE — Plan of Care (Signed)
Problem: RH BOWEL ELIMINATION Goal: RH STG MANAGE BOWEL W/MEDICATION W/ASSISTANCE STG Manage Bowel with Medication with min Assistance.  Outcome: Not Progressing Patient hasnt had BM in 3 days even with use of Sennokot

## 2013-08-13 NOTE — Progress Notes (Addendum)
Physical Therapy Weekly Progress Note  Patient Details  Name: [name] MRN: [id number] Date of Birth: 05/11/1976  Today's Date: 08/13/2013 Time: 1100-1140 Time Calculation (min): 40 min  Patient has met 5 of 5 short term goals.  Pt has demonstrated very good progress regarding quality, safety and endurance of all functional mobility. Pt progressing to ambulation w/ RW as primary means of locomotion, however, continues to benefit from use of w/c at times when fatigued.   Patient continues to demonstrate the following deficits: decreased functional endurance, decreased strength, decreased balance and therefore will continue to benefit from skilled PT intervention to enhance overall performance with activity tolerance, balance, ability to compensate for deficits and functional strength.  Patient progressing toward long term goals.  Continue plan of care.  PT Short Term Goals Week 1:  PT Short Term Goal 1 (Week 1): Pt to perform bed mobility w/ min A PT Short Term Goal 1 - Progress (Week 1): Met PT Short Term Goal 2 (Week 1): Pt to perform transfers w/ min guard assist PT Short Term Goal 2 - Progress (Week 1): Met PT Short Term Goal 3 (Week 1): Pt to propel w/c x50' w/ min A PT Short Term Goal 3 - Progress (Week 1): Met PT Short Term Goal 4 (Week 1): Pt to ambulate 30' w/ RW and min A  PT Short Term Goal 4 - Progress (Week 1): Met PT Short Term Goal 5 (Week 1): Pt to negotiate up/down 1 curb step w/ RW and mod A  PT Short Term Goal 5 - Progress (Week 1): Met Week 2:  PT Short Term Goal 1 (Week 2): LTGs=STGs  Skilled Therapeutic Interventions/Progress Updates:  1:1. Pt able to amb w/ RW to bathroom, manage clothing and perform pericare w/ overall (S). Focus majority of tx session on gait training in various environments as well as stair and curb step negotiation. Pt able to amb 15'x2 and 5' w/ RW on unit as well as 25', 63' and 54' w/ RW off unit in busy community environment, close (S)  overall. Pt demonstrates very fluid, reciprocal and normalized gait pattern at this time. Pt demonstrates good judgement regarding need to sit 2/2 fatigue. Trial amb w HHA vs. RW, pt able to amb 25' in controlled environment w/ min A. Pt verbalized feeling of increased confidence/safety w/ use of RW vs. HHA and SPC tried in previous tx session. Pt able to negotiate up/down 2-3 steps x2 w/ use of single rail as well as negotiate up/down 4" curb step w/ RW and min A, verbal cues provided for safety and seq. Pt also able to propel w/c 75'x2 this tx session. Pt verbalized feeling of fatigue after bouts of amb, requesting to return to room. Pt sitting up in bed at end of tx session w/ all needs in reach and nurse tech in room. Pt missed this tx session 2/2 fatigue.   Pt's LTGs upgraded due to progress demonstrated.   Therapy Documentation Precautions:  Precautions Precautions: Fall Precaution Comments: LE's "give way" per patient. Restrictions Weight Bearing Restrictions: No Pain: Pain Assessment Pain Assessment: 0-10 Pain Score: 3  Pain Type: Chronic pain Pain Location: Back Pain Orientation: Mid;Upper;Lower Pain Descriptors / Indicators: Aching;Sharp Pain Onset: On-going Patients Stated Pain Goal: 4 Pain Intervention(s): Other (Comment) (Pt states that RN aware)  See FIM for current functional status  Therapy/Group: Individual Therapy  Denzil Hughes 08/13/2013, 11:47 AM

## 2013-08-13 NOTE — Progress Notes (Signed)
Speech Language Pathology Weekly Progress & Session Notes  Patient Details  Name: [name] MRN: [id number] Date of Birth: 11-Nov-1976  Today's Date: 08/13/2013 Time: 4098-1191 Time Calculation (min): 40 min  Short Term Goals: Week 1: SLP Short Term Goal 1 (Week 1): Pt will demonstrate complex problem solving with functional and familiar tasks with supervision verbal cues.  SLP Short Term Goal 1 - Progress (Week 1): Met SLP Short Term Goal 2 (Week 1): Pt will utilize external memory aids to recall new, daily information with Mod I.  SLP Short Term Goal 2 - Progress (Week 1): Met SLP Short Term Goal 3 (Week 1): Pt will utilize visual aids/cues to increase organization of functional information with supervision verbal cues.  SLP Short Term Goal 3 - Progress (Week 1): Met  New Short Term Goals:  Week 2: SLP Short Term Goal 1 (Week 2): Pt will utilize visual aids/cues to increase organization of functional information with Mod I verbal cues.  SLP Short Term Goal 2 (Week 2): Pt will demonstrate complex problem solving with functional and familiar tasks with Mod I.   Weekly Progress Updates: Pt has made excellent gains and has met 3 of 3 STG's this reporting period. Currently, pt is overall Mod I for utilization of external memory aids to increase recall/carryover of newly learned information. Pt also requires supervision verbal and visual cues for organization and problem solving with complex tasks.  Pt is demonstrating increased anticipatory awareness for discharge planning in regards to energy conservation and organizational strategies. Pt/family education ongoing.  Pt would benefit from continued skilled SLP intervention to maximize cognitive function and overall functional independence prior to discharge home.    SLP Intensity: Minumum of 1-2 x/day, 30 to 90 minutes SLP Frequency: 5 out of 7 days SLP Duration/Estimated Length of Stay: 08/19/13 SLP Treatment/Interventions: Cognitive  remediation/compensation;Cueing hierarchy;Environmental controls;Internal/external aids;Patient/family education;Therapeutic Activities;Functional tasks  Daily Session Skilled Therapeutic Intervention: Treatment focus on cognitive goals. Prior to beginning of session, pt independently made a list of important information that needs to be organized to increase recall/carryover. Pt utilized a computer to research new recipes to increase meal planning and overall energy conservation. Pt was overall Mod I for navigation of the Internet and problem solving/organization with task.  FIM:  Comprehension Comprehension Mode: Auditory Comprehension: 6-Follows complex conversation/direction: With extra time/assistive device Expression Expression Mode: Verbal Expression: 7-Expresses complex ideas: With no assist Social Interaction Social Interaction: 6-Interacts appropriately with others with medication or extra time (anti-anxiety, antidepressant). Problem Solving Problem Solving: 5-Solves complex 90% of the time/cues < 10% of the time Memory Memory: 6-More than reasonable amt of time Pain Pain Assessment Pain Assessment: No/denies pain  Therapy/Group: Individual Therapy  Jadaya Sommerfield 08/13/2013, 4:02 PM

## 2013-08-13 NOTE — Progress Notes (Signed)
Occupational Therapy Weekly Progress Note  Patient Details  Name: [name] MRN: [id number] Date of Birth: 02-24-1976  Today's Date: 08/13/2013  Patient has met 4 of 4 short term goals.   Patient is currently Mod I during ADL for 15-30 min, sitting/standing ADL; supervision when fatigued after 30 min.  Patient continues to demonstrate the following deficits: Sudden and progressive fatigue after 30 minutes of sitting/standing ADL and therefore will continue to benefit from skilled OT intervention to enhance overall performance with BADL and iADL.  Patient progressing toward long term goals..  Continue plan of care.  OT Short Term Goals revised as follows: Week 2:  OT Short Term Goal 1 (Week 2): Patient will demonstrate ability to complete 30 min of UE arm ergometry to improve endurance with 1 rest break OT Short Term Goal 2 (Week 2): Patient will demonstrate ability to perform HEP to improve bil UE strength with supervision OT Short Term Goal 3 (Week 2): Patient will demonstrate ability to recover from incidental falls as instructed with supervision  OT Short Term Goal 4 (Week 2): Patient will verbally state 5 principles of energy conservation she will use to reduce potential for relapse of symptoms during ADL.  Therapy Documentation Precautions:  Precautions Precautions: Fall Precaution Comments: LE's "give way" per patient. Restrictions Weight Bearing Restrictions: No  Pain: Pain Assessment Pain Assessment: No/denies pain  See FIM for current functional status  Therapy/Group: Individual Therapy  Georgeanne Nim 08/13/2013, 4:18 PM

## 2013-08-13 NOTE — Progress Notes (Signed)
Subjective/Complaints: Sleeping well. Pain an issue still at times.  A 12 point review of systems has been performed and if not noted above is otherwise negative.   Objective: Vital Signs: Blood pressure 110/76, pulse 88, temperature 98.5 F (36.9 C), temperature source Oral, resp. rate 18, height 5\' 3"  (1.6 m), weight 105.098 kg (231 lb 11.2 oz), last menstrual period 03/17/2012, SpO2 95.00%. No results found. No results found for this basename: WBC, HGB, HCT, PLT,  in the last 72 hours  Recent Labs  08/12/13 0535  NA 139  K 3.7  CL 101  GLUCOSE 110*  BUN 6  CREATININE 0.66  CALCIUM 9.3   CBG (last 3)   Recent Labs  08/12/13 1632 08/12/13 2125 08/13/13 0721  GLUCAP 115* 121* 100*    Wt Readings from Last 3 Encounters:  08/05/13 105.098 kg (231 lb 11.2 oz)  08/01/13 102.2 kg (225 lb 5 oz)  07/09/13 102.059 kg (225 lb)    Physical Exam:  Constitutional: She is oriented to person, place, and time. She appears well-developed and well-nourished. obese  HENT:  Head: Normocephalic and atraumatic.  Eyes: Conjunctivae are normal. Pupils are equal, round, and reactive to light.  Neck: Normal range of motion. Neck supple.  Cardiovascular: Normal rate and regular rhythm.  No murmur heard.  Pulmonary/Chest: Effort normal and breath sounds normal. No respiratory distress.  Abdominal: Soft. Bowel sounds are normal. She exhibits no distension. There is no tenderness.  Neurological: She is alert and oriented to person, place, and time.  Decreased visual acuity, with mild diplopia . She exhibits normal muscle tone. RUE is 4/5 deltoid, biceps, triceps, 4 to 4+ HI. LUE is 4+ to 5/5 in deltoid, biceps, triceps, HI. RLE is 1-2/5 HF, 2- KE and 1+ to 2 ankle. LLE is 2+ to 3/5 HF, KE and 2+ to 3/5 left ankle. Decreased pp/lt both legs to thigh and bilateral hands to wrist. Cognitively she displayed reasonable insight and awareness. Attention was good. She is quite alert.  Cerebellar  testing finger-nose-finger normal in the upper and lower limbs  Skin: Skin is warm and dry. No erythema.    Assessment/Plan: 1. Functional deficits secondary to MS exacerbation which require 3+ hours per day of interdisciplinary therapy in a comprehensive inpatient rehab setting. Physiatrist is providing close team supervision and 24 hour management of active medical problems listed below. Physiatrist and rehab team continue to assess barriers to discharge/monitor patient progress toward functional and medical goals. FIM: FIM - Bathing Bathing Steps Patient Completed: Chest;Right Arm;Left Arm;Abdomen;Front perineal area;Buttocks;Right upper leg;Left upper leg;Right lower leg (including foot);Left lower leg (including foot) Bathing: 6: Assistive device (Comment)  FIM - Upper Body Dressing/Undressing Upper body dressing/undressing steps patient completed: Thread/unthread right sleeve of pullover shirt/dresss;Thread/unthread left sleeve of pullover shirt/dress;Put head through opening of pull over shirt/dress;Pull shirt over trunk Upper body dressing/undressing: 5: Supervision: Safety issues/verbal cues FIM - Lower Body Dressing/Undressing Lower body dressing/undressing steps patient completed: Thread/unthread right underwear leg;Thread/unthread left underwear leg;Pull underwear up/down;Thread/unthread right pants leg;Thread/unthread left pants leg;Pull pants up/down;Fasten/unfasten pants;Don/Doff right sock;Don/Doff left sock Lower body dressing/undressing: 5: Supervision: Safety issues/verbal cues  FIM - Toileting Toileting steps completed by patient: Adjust clothing prior to toileting;Performs perineal hygiene;Adjust clothing after toileting Toileting Assistive Devices: Grab bar or rail for support Toileting: 4: Steadying assist  FIM - Diplomatic Services operational officer Devices: Grab bars Toilet Transfers: 4-To toilet/BSC: Min A (steadying Pt. > 75%);4-From toilet/BSC: Min A  (steadying Pt. > 75%)  FIM -  Bed/Chair Transport planner Devices: Arm rests Bed/Chair Transfer: 5: Supine > Sit: Supervision (verbal cues/safety issues);5: Sit > Supine: Supervision (verbal cues/safety issues);5: Bed > Chair or W/C: Supervision (verbal cues/safety issues);5: Chair or W/C > Bed: Supervision (verbal cues/safety issues)  FIM - Locomotion: Wheelchair Distance: 74' (with two rest breaks) Locomotion: Wheelchair: 2: Travels 50 - 149 ft with supervision, cueing or coaxing FIM - Locomotion: Ambulation Locomotion: Ambulation Assistive Devices: Emergency planning/management officer Ambulation/Gait Assistance: 4: Min guard Locomotion: Ambulation: 1: Travels less than 50 ft with minimal assistance (Pt.>75%)  Comprehension Comprehension Mode: Auditory Comprehension: 5-Understands basic 90% of the time/requires cueing < 10% of the time  Expression Expression Mode: Verbal Expression: 6-Expresses complex ideas: With extra time/assistive device  Social Interaction Social Interaction: 6-Interacts appropriately with others with medication or extra time (anti-anxiety, antidepressant).  Problem Solving Problem Solving: 5-Solves complex 90% of the time/cues < 10% of the time  Memory Memory: 6-Assistive device: No helper  Medical Problem List and Plan:  1. DVT Prophylaxis/Anticoagulation: Pharmaceutical: Lovenox  2. Pain Management: tylenol prn often doesn't cover pain  -increased gabapentin to 600mg  q8. Consider TCA also  - hydrocodone  for breakthrough pain. Can have tramadol available as well.  3. Mood: Provide ego support. LCSW to follow for evaluation.  4. Neuropsych: This patient is capable of making decisions on his own behalf.  5. DM type 2: Monitor with AC/HS cbg checks. Metformin at 500 mg bid. Will continue meal coverage for now. Sugars improved 6. Polyneuropathy: Will continue neurontin as above.  7. Insomnia: Will try trazodone prn.  8. Neurogenic bowel---increase senokot  s to 2 tabs at night    LOS (Days) 8 A FACE TO FACE EVALUATION WAS PERFORMED  Lariya Kinzie T 08/13/2013 8:16 AM

## 2013-08-14 ENCOUNTER — Inpatient Hospital Stay (HOSPITAL_COMMUNITY): Payer: No Typology Code available for payment source

## 2013-08-14 ENCOUNTER — Inpatient Hospital Stay (HOSPITAL_COMMUNITY): Payer: No Typology Code available for payment source | Admitting: Speech Pathology

## 2013-08-14 DIAGNOSIS — G35 Multiple sclerosis: Secondary | ICD-10-CM

## 2013-08-14 LAB — GLUCOSE, CAPILLARY
Glucose-Capillary: 105 mg/dL — ABNORMAL HIGH (ref 70–99)
Glucose-Capillary: 96 mg/dL (ref 70–99)
Glucose-Capillary: 105 mg/dL — ABNORMAL HIGH (ref 70–99)
Glucose-Capillary: 84 mg/dL (ref 70–99)

## 2013-08-14 MED ORDER — NORTRIPTYLINE HCL 10 MG PO CAPS
10.0000 mg | ORAL_CAPSULE | Freq: Every day | ORAL | Status: DC
  Administered 2013-08-16 – 2013-08-17 (×2): 10 mg via ORAL
  Filled 2013-08-14 (×5): qty 1

## 2013-08-14 NOTE — Progress Notes (Signed)
Occupational Therapy Session Note  Patient Details  Name: [name] MRN: [id number] Date of Birth: [birth year]  Today's Date: 08/14/2013 Time: 0827-0922 Time Calculation (min): 55 min  Short Term Goals: Week 2:  OT Short Term Goal 1 (Week 2): Patient will demonstrate ability to complete 30 min of UE arm ergometry to improve endurance with 1 rest break OT Short Term Goal 2 (Week 2): Patient will demonstrate ability to perform HEP to improve bil UE strength with supervision OT Short Term Goal 3 (Week 2): Patient will demonstrate ability to recover from incidental falls as instructed with supervision  OT Short Term Goal 4 (Week 2): Patient will verbally state 5 principles of energy conservation she will use to reduce potential for relapse of symptoms during ADL.  Skilled Therapeutic Interventions/Progress Updates: ADL-retraining with emphasis on family education on assistance required for ADL and on patient's improved awareness and carry-over of symptom management during ADL.  Patient completed bathing and dressing with her daughter Lyna Poser assisting with setup and supervision for safety this session.   After resting 5 minutes, patient was escorted to patient laundry where she initiated clothing maintenance using RW and OT supervision, with standby assist from her daughter.   At end of session, patient reported her plan to fabricate her own "cooling vest" as advised by MS Society representative as a means to avoid elevating her core temp during ADL.    Therapy Documentation Precautions:  Precautions Precautions: Fall Precaution Comments: LE's "give way" per patient. Restrictions Weight Bearing Restrictions: No General:   Vital Signs: Therapy Vitals Temp: 98.3 F (36.8 C) Temp src: Oral Pulse Rate: 78 Resp: 18 BP: 132/78 mmHg Patient Position, if appropriate: Lying Oxygen Therapy SpO2: 97 %  Pain: Pain Assessment Pain Assessment: 0-10 Pain Score: 3  Pain Type: Chronic pain Pain  Location: Back Pain Orientation: Upper;Mid;Lower Pain Descriptors / Indicators: Aching Pain Frequency: Constant Pain Onset: On-going Patients Stated Pain Goal: 4 Pain Intervention(s): Medication (See eMAR)  See FIM for current functional status  Therapy/Group: Individual Therapy  Second session: Time: 4098-1191 Time Calculation (min): 28 min  Pain Assessment: 3/10  Skilled Therapeutic Interventions: Therapeutic activities with emphasis on patient and family training on falls recovery.   After demonstration to patient's daughter Dawayne Patricia present this date, patient was able to independently lower herself to the floor, assume full supine position and recover to a standard dining chair presented by her daughter.   Patient required correction to one component of recovery to reduce exertion.   Patient was able to independently problem-solve given several scenarios which included possible serious injury and avoidance of attempted recovery.   OT educated patient on use of emergency medical pendant if fall occurs at home while she is alone.   See FIM for current functional status  Therapy/Group: Individual Therapy  Georgeanne Nim 08/14/2013, 9:23 AM

## 2013-08-14 NOTE — Progress Notes (Signed)
Speech Language Pathology Daily Session Note  Patient Details  Name: [name] MRN: [id number] Date of Birth: 28-Jan-1976  Today's Date: 08/14/2013 Time: 6213-0865 Time Calculation (min): 40 min  Short Term Goals: Week 2: SLP Short Term Goal 1 (Week 2): Pt will utilize visual aids/cues to increase organization of functional information with Mod I verbal cues.  SLP Short Term Goal 2 (Week 2): Pt will demonstrate complex problem solving with functional and familiar tasks with Mod I.   Skilled Therapeutic Interventions: Treatment focus on cognitive goals. SLP facilitated session by providing supervision question cues for complex problem solving/organization with scheduling and comparing/contrasting task in a moderately visually distracting environment. Pt independently demonstrated emergent awareness of fatigue and the impact it has on her cognition.    FIM:  Comprehension Comprehension Mode: Auditory Comprehension: 6-Follows complex conversation/direction: With extra time/assistive device Expression Expression Mode: Verbal Expression: 7-Expresses complex ideas: With no assist Social Interaction Social Interaction: 6-Interacts appropriately with others with medication or extra time (anti-anxiety, antidepressant). Problem Solving Problem Solving: 5-Solves complex 90% of the time/cues < 10% of the time Memory Memory: 6-More than reasonable amt of time FIM - Eating Eating Activity: 7: Complete independence:no helper  Pain Pain Assessment Pain Assessment: No/denies pain Pain Score: 3  Pain Type: Chronic pain Pain Location: Back Pain Orientation: Upper;Mid;Lower Pain Descriptors / Indicators: Aching Pain Frequency: Constant Pain Onset: On-going Patients Stated Pain Goal: 4 Pain Intervention(s): Medication (See eMAR)  Therapy/Group: Individual Therapy  [name] 08/14/2013, 12:05 PM

## 2013-08-14 NOTE — Progress Notes (Signed)
Subjective/Complaints: Had a good night. Pain better with increased gabapentin. It still is more prevalent at night.   A 12 point review of systems has been performed and if not noted above is otherwise negative.   Objective: Vital Signs: Blood pressure 132/78, pulse 78, temperature 98.3 F (36.8 C), temperature source Oral, resp. rate 18, height 5\' 3"  (1.6 m), weight 105.098 kg (231 lb 11.2 oz), last menstrual period 03/17/2012, SpO2 97.00%. No results found. No results found for this basename: WBC, HGB, HCT, PLT,  in the last 72 hours  Recent Labs  08/12/13 0535  NA 139  K 3.7  CL 101  GLUCOSE 110*  BUN 6  CREATININE 0.66  CALCIUM 9.3   CBG (last 3)   Recent Labs  08/13/13 1650 08/13/13 2112 08/14/13 0736  GLUCAP 90 116* 105*    Wt Readings from Last 3 Encounters:  08/05/13 105.098 kg (231 lb 11.2 oz)  08/01/13 102.2 kg (225 lb 5 oz)  07/09/13 102.059 kg (225 lb)    Physical Exam:  Constitutional: She is oriented to person, place, and time. She appears well-developed and well-nourished. obese  HENT:  Head: Normocephalic and atraumatic.  Eyes: Conjunctivae are normal. Pupils are equal, round, and reactive to light.  Neck: Normal range of motion. Neck supple.  Cardiovascular: Normal rate and regular rhythm.  No murmur heard.  Pulmonary/Chest: Effort normal and breath sounds normal. No respiratory distress.  Abdominal: Soft. Bowel sounds are normal. She exhibits no distension. There is no tenderness.  Neurological: She is alert and oriented to person, place, and time.  Decreased visual acuity, with mild diplopia . She exhibits normal muscle tone. RUE is 4/5 deltoid, biceps, triceps, 4 to 4+ HI. LUE is 4+ to 5/5 in deltoid, biceps, triceps, HI. RLE is 2/5 HF, 2 KE and 1+ to 2 ankle. LLE is 2+ to 3/5 HF, KE and 2+ to 3/5 left ankle. Decreased pp/lt both legs to thigh and bilateral hands to wrist. Cognitively she displayed reasonable insight and awareness. Attention was  good. She is quite alert.  Cerebellar testing finger-nose-finger normal in the upper and lower limbs  Skin: Skin is warm and dry. No erythema.    Assessment/Plan: 1. Functional deficits secondary to MS exacerbation which require 3+ hours per day of interdisciplinary therapy in a comprehensive inpatient rehab setting. Physiatrist is providing close team supervision and 24 hour management of active medical problems listed below. Physiatrist and rehab team continue to assess barriers to discharge/monitor patient progress toward functional and medical goals. FIM: FIM - Bathing Bathing Steps Patient Completed: Chest;Right Arm;Left Arm;Abdomen;Front perineal area;Buttocks;Right upper leg;Left upper leg;Left lower leg (including foot);Right lower leg (including foot) Bathing: 6: Assistive device (Comment)  FIM - Upper Body Dressing/Undressing Upper body dressing/undressing steps patient completed: Thread/unthread right sleeve of pullover shirt/dresss;Thread/unthread left sleeve of pullover shirt/dress;Put head through opening of pull over shirt/dress;Pull shirt over trunk Upper body dressing/undressing: 7: Complete Independence: No helper FIM - Lower Body Dressing/Undressing Lower body dressing/undressing steps patient completed: Thread/unthread right underwear leg;Thread/unthread left underwear leg;Pull underwear up/down Lower body dressing/undressing: 7: Complete Independence: No helper  FIM - Toileting Toileting steps completed by patient: Adjust clothing prior to toileting;Performs perineal hygiene;Adjust clothing after toileting Toileting Assistive Devices: Grab bar or rail for support Toileting: 5: Supervision: Safety issues/verbal cues  FIM - Diplomatic Services operational officer Devices: Grab bars Toilet Transfers: 4-To toilet/BSC: Min A (steadying Pt. > 75%);4-From toilet/BSC: Min A (steadying Pt. > 75%)  FIM - Bed/Chair Transfer Bed/Chair  Transfer Assistive Devices: Arm  rests;Walker Bed/Chair Transfer: 5: Bed > Chair or W/C: Supervision (verbal cues/safety issues);5: Chair or W/C > Bed: Supervision (verbal cues/safety issues)  FIM - Locomotion: Wheelchair Distance: 101' (with two rest breaks) Locomotion: Wheelchair: 2: Travels 50 - 149 ft with supervision, cueing or coaxing FIM - Locomotion: Ambulation Locomotion: Ambulation Assistive Devices: Designer, industrial/product Ambulation/Gait Assistance: 4: Min assist Locomotion: Ambulation: 1: Travels less than 50 ft with minimal assistance (Pt.>75%)  Comprehension Comprehension Mode: Auditory Comprehension: 6-Follows complex conversation/direction: With extra time/assistive device  Expression Expression Mode: Verbal Expression: 7-Expresses complex ideas: With no assist  Social Interaction Social Interaction: 6-Interacts appropriately with others with medication or extra time (anti-anxiety, antidepressant).  Problem Solving Problem Solving: 5-Solves complex 90% of the time/cues < 10% of the time  Memory Memory: 6-More than reasonable amt of time  Medical Problem List and Plan:  1. DVT Prophylaxis/Anticoagulation: Pharmaceutical: Lovenox  2. Pain Management: tylenol prn often doesn't cover pain  -increased gabapentin to 600mg  q8. Will add low dose pamelor tonight as well. (pt aware)  - hydrocodone  for breakthrough pain. Can have tramadol available as well.   -mentioned cooling towels ("frog togs") which may help also with activity tolerance 3. Mood: Provide ego support. LCSW to follow for evaluation.  4. Neuropsych: This patient is capable of making decisions on his own behalf.  5. DM type 2: Monitor with AC/HS cbg checks. Metformin at 500 mg bid. Will continue meal coverage for now. Sugars improved 6. Polyneuropathy: Will continue neurontin as above.  7. Insomnia: Will try trazodone prn.  8. Neurogenic bowel---increased senokot s to 2 tabs at night    LOS (Days) 9 A FACE TO FACE EVALUATION WAS  PERFORMED  Tiffony Kite T 08/14/2013 8:22 AM

## 2013-08-14 NOTE — Progress Notes (Addendum)
Physical Therapy Session Note  Patient Details  Name: [name] MRN: [id number] Date of Birth: 1976/03/10  Today's Date: 08/14/2013 Time: 1300-1400 Time Calculation (min): 60 min  Short Term Goals: Week 2:  PT Short Term Goal 1 (Week 2): LTGs=STGs  Skilled Therapeutic Interventions/Progress Updates:  1:1. Focus this tx session on reassessment of functional mobility w/ family present and initiating family training. Pt able to amb into/out of bathroom, 15'x2 w/ min HHA, (S) for toilet t/f, management of clothing and pericare. Pt's daughter and mother received visual demonstration by therpist and pt of car t/f, stair negotiation and curb step negotiation. Pt req close (S) w/ use of RW during car t/f, min HHA up/down 8, 6" steps as well as close (S) w/ min v/c for 6" curb step negotiation w/ RW. Pt verbalized this tx session that she is unable to use hand rail for stairs inside home environment due to poor attachment to wall. Pt verbalized understanding that someone must then provide physical assist with this activity. Pt able to amb 75'x2 and 50'x3 this tx session w/ RW and close (S) initially demonstrated by therapist, then safely performed by pt's daughter and mother. Pt's daughter and mother cleared to provide close (S) for mobility in room w/ RW, RN and nurse tech aware. Pt verbalized understanding that amb w/out RW was to be performed in therapy for balance purposes only. Pt's balance challenged this tx session by forward and backward walking as well as side walking w/ use of min-mod HHA or min A w/ use of hand rail in hall. Pt w/ decreased tolerance to balance activities 2/2 fatigue. Pt performed NuStep at end of tx session for focus on B LE strength/endurance, level 2x23min w/ 1 rest break. Pt w/ good tolerance to tx session overall. Pt continues to benefit from frequent, but short seated rest breaks 2/2 fatigue. Pt sitting in bed at end of tx session w/ all needs in reach and family in room.    Therapy Documentation Precautions:  Precautions Precautions: Fall Precaution Comments: LE's "give way" per patient. Restrictions Weight Bearing Restrictions: No Therapy Vitals Temp: 98.2 F (36.8 C) Temp src: Oral Pulse Rate: 85 Resp: 18 BP: 139/82 mmHg Patient Position, if appropriate: Sitting Oxygen Therapy SpO2: 95 % O2 Device: None (Room air) Pain: Pain Assessment Pain Assessment: 0-10 Pain Score: 7  Pain Location: Back Pain Orientation: Lower;Mid Pain Descriptors / Indicators: Aching Pain Intervention(s): RN made aware  See FIM for current functional status  Therapy/Group: Individual Therapy  Denzil Hughes 08/14/2013, 4:08 PM

## 2013-08-14 NOTE — Progress Notes (Signed)
Late entry:  Patient's CBG dropped to 47 prior to lunch.  Patient was symptomatic with blurry visions.  Gave patient half cup of sprite and patient ate lunch.  Recheck at 1159 CBG=96, patient stated she was fine.  Patient has insulin pump on the LLQ, patient manage the pump herself. Patient did not give any insulin at lunch d/t low CBG.  Notified Harvel Ricks, PA no new order received at the time.

## 2013-08-15 ENCOUNTER — Inpatient Hospital Stay (HOSPITAL_COMMUNITY): Payer: No Typology Code available for payment source | Admitting: *Deleted

## 2013-08-15 DIAGNOSIS — G35 Multiple sclerosis: Secondary | ICD-10-CM

## 2013-08-15 DIAGNOSIS — R209 Unspecified disturbances of skin sensation: Secondary | ICD-10-CM

## 2013-08-15 LAB — GLUCOSE, CAPILLARY
Glucose-Capillary: 90 mg/dL (ref 70–99)
Glucose-Capillary: 92 mg/dL (ref 70–99)
Glucose-Capillary: 103 mg/dL — ABNORMAL HIGH (ref 70–99)
Glucose-Capillary: 95 mg/dL (ref 70–99)

## 2013-08-15 NOTE — Progress Notes (Signed)
[name] is a 37 y.o. female 10-21-1976 [id number]  Subjective: C/o skin burning, pains all over.  Objective: Vital signs in last 24 hours: Temp:  [98.2 F (36.8 C)] 98.2 F (36.8 C) (09/13 0518) Pulse Rate:  [85] 85 (09/13 0518) Resp:  [18] 18 (09/13 0518) BP: (92-139)/(60-82) 92/60 mmHg (09/13 0518) SpO2:  [93 %-95 %] 93 % (09/13 0518) Weight change:  Last BM Date: 08/14/13  Intake/Output from previous day: 09/12 0701 - 09/13 0700 In: 600 [P.O.:600] Out: 1 [Urine:1] Last cbgs: CBG (last 3)   Recent Labs  08/14/13 1657 08/14/13 2107 08/15/13 0720  GLUCAP 105* 84 90     Physical Exam General: No apparent distress    HEENT: moist mucosa Lungs: Normal effort. Lungs clear to auscultation, no crackles or wheezes. Cardiovascular: Regular rate and rhythm, no edema Abdomen: S/NT/ND; BS(+) Musculoskeletal:  No change from before Neurological: No new neurological deficits Wounds: N/A    Skin: clear Alert, cooperative   Lab Results: BMET    Component Value Date/Time   NA 139 08/12/2013 0535   K 3.7 08/12/2013 0535   CL 101 08/12/2013 0535   CO2 28 08/12/2013 0535   GLUCOSE 110* 08/12/2013 0535   BUN 6 08/12/2013 0535   CREATININE 0.66 08/12/2013 0535   CALCIUM 9.3 08/12/2013 0535   GFRNONAA >90 08/12/2013 0535   GFRAA >90 08/12/2013 0535   CBC    Component Value Date/Time   WBC 10.9* 08/06/2013 0620   RBC 4.29 08/06/2013 0620   HGB 13.3 08/06/2013 0620   HCT 38.0 08/06/2013 0620   PLT 290 08/06/2013 0620   MCV 88.6 08/06/2013 0620   MCH 31.0 08/06/2013 0620   MCHC 35.0 08/06/2013 0620   RDW 12.7 08/06/2013 0620   LYMPHSABS 5.2* 08/06/2013 0620   MONOABS 0.9 08/06/2013 0620   EOSABS 0.0 08/06/2013 0620   BASOSABS 0.0 08/06/2013 0620    Studies/Results: No results found.  Medications: I have reviewed the patient's current medications.  Assessment/Plan:  1. DVT Prophylaxis/Anticoagulation: Pharmaceutical: Lovenox  2. Pain Management: tylenol prn often doesn't cover pain   -increased gabapentin to 600mg  q8. Will add low dose pamelor tonight as well. (pt aware)  - hydrocodone for breakthrough pain. Can have tramadol available as well.  -mentioned cooling towels ("frog togs") which may help also with activity tolerance  3. Mood: Provide ego support. LCSW to follow for evaluation.  4. Neuropsych: This patient is capable of making decisions on his own behalf.  5. DM type 2: Monitor with AC/HS cbg checks. Metformin at 500 mg bid. Will continue meal coverage for now. Sugars improved  6. Polyneuropathy: Will continue neurontin as above.  7. Insomnia: Will try trazodone prn.  8. Neurogenic bowel---increased senokot s to 2 tabs at night      Length of stay, days: 10  Sonda Primes , MD 08/15/2013, 8:31 AM

## 2013-08-15 NOTE — Progress Notes (Signed)
Occupational Therapy Note  Patient Details  Name: [name] MRN: [id number] Date of Birth: 21-Sep-1976 Today's Date: 08/15/2013  Time: 1515-1615  (60 min) Pain:  5/10 back Individual session  Addressed pt/family education on floor recovery, tub bench transfer, energy conservation techniques in the home.  Husband and daughter present.  Demonstrated floor recovery handling and positioning for falls.  Husband/daughter verbalized and demonstrated understanding.  Went over energy conservation strategies in kitchen and bathroom.  Provided suction soap holder, dycem and large red gripper for built up handles.  Demonstrated tub transfer using tub transfer bench.  Educated on how to keep the water from going out in bathroom.  Left pt with family in room with call bell in reach.     Humberto Seals 08/15/2013, 4:51 PM

## 2013-08-16 ENCOUNTER — Inpatient Hospital Stay (HOSPITAL_COMMUNITY): Payer: No Typology Code available for payment source | Admitting: Physical Therapy

## 2013-08-16 LAB — GLUCOSE, CAPILLARY
Glucose-Capillary: 96 mg/dL (ref 70–99)
Glucose-Capillary: 83 mg/dL (ref 70–99)
Glucose-Capillary: 86 mg/dL (ref 70–99)
Glucose-Capillary: 110 mg/dL — ABNORMAL HIGH (ref 70–99)

## 2013-08-16 NOTE — Progress Notes (Signed)
[name] is a 37 y.o. female 05-02-1976 [id number]  Subjective: C/o skin burning, pains all over - much better today. She was asking about an antidepressant that was offered to her and that she declined.  Objective: Vital signs in last 24 hours: Temp:  [98.1 F (36.7 C)] 98.1 F (36.7 C) (09/14 0545) Pulse Rate:  [85-86] 85 (09/14 0545) Resp:  [17-18] 17 (09/14 0545) BP: (101-112)/(70-80) 112/80 mmHg (09/14 0545) SpO2:  [97 %-98 %] 97 % (09/14 0545) Weight change:  Last BM Date: 08/15/13  Intake/Output from previous day: 09/13 0701 - 09/14 0700 In: 960 [P.O.:960] Out: -  Last cbgs: CBG (last 3)   Recent Labs  08/15/13 1623 08/15/13 2059 08/16/13 0702  GLUCAP 103* 95 96     Physical Exam General: No apparent distress    HEENT: moist mucosa Lungs: Normal effort. Lungs clear to auscultation, no crackles or wheezes. Cardiovascular: Regular rate and rhythm, no edema Abdomen: S/NT/ND; BS(+) Musculoskeletal:  No change from before Neurological: No new neurological deficits Wounds: N/A    Skin: clear Alert, cooperative Pt denies being depressed   Lab Results: BMET    Component Value Date/Time   NA 139 08/12/2013 0535   K 3.7 08/12/2013 0535   CL 101 08/12/2013 0535   CO2 28 08/12/2013 0535   GLUCOSE 110* 08/12/2013 0535   BUN 6 08/12/2013 0535   CREATININE 0.66 08/12/2013 0535   CALCIUM 9.3 08/12/2013 0535   GFRNONAA >90 08/12/2013 0535   GFRAA >90 08/12/2013 0535   CBC    Component Value Date/Time   WBC 10.9* 08/06/2013 0620   RBC 4.29 08/06/2013 0620   HGB 13.3 08/06/2013 0620   HCT 38.0 08/06/2013 0620   PLT 290 08/06/2013 0620   MCV 88.6 08/06/2013 0620   MCH 31.0 08/06/2013 0620   MCHC 35.0 08/06/2013 0620   RDW 12.7 08/06/2013 0620   LYMPHSABS 5.2* 08/06/2013 0620   MONOABS 0.9 08/06/2013 0620   EOSABS 0.0 08/06/2013 0620   BASOSABS 0.0 08/06/2013 0620    Studies/Results: No results found.  Medications: I have reviewed the patient's current  medications.  Assessment/Plan:  1. DVT Prophylaxis/Anticoagulation: Pharmaceutical: Lovenox  2. Pain Management: tylenol prn often doesn't cover pain  -increased gabapentin to 600mg  q8. Will add low dose pamelor tonight as well. (pt aware)  - hydrocodone for breakthrough pain. Can have tramadol available as well.  -mentioned cooling towels ("frog togs") which may help also with activity tolerance  3. Mood: Provide ego support. LCSW to follow for evaluation.  4. Neuropsych: This patient is capable of making decisions on his own behalf.  5. DM type 2: Monitor with AC/HS cbg checks. Metformin at 500 mg bid. Will continue meal coverage for now. Sugars improved  6. Polyneuropathy: Will continue neurontin as above.  7. Insomnia: Will try trazodone prn.  8. Neurogenic bowel---increased senokot s to 2 tabs at night   She was asking about an antidepressant that was offered to her and that she declined. She did not get depressed on Interferon in the past..Marland KitchenOK to wait. She is on Pamelor now.    Length of stay, days: 11  Sonda Primes , MD 08/16/2013, 8:44 AM

## 2013-08-16 NOTE — Progress Notes (Signed)
Physical Therapy Note  Patient Details  Name: [name] MRN: [id number] Date of Birth: [birth year] Today's Date: 08/16/2013  1530-1600 (30 minutes) individual (missed 15 minutes secondary to fatigue) Pain: no complaint of pain Focus of treatment: gait training including steps ; therapeutic activities focused on standing balance Treatment: Gait 100 feet RW SBA before c/o fatigue; wc mobility 75 feet SBA to modified independent on level surfaces (distance limited by fatigue); up/down 2 steps X 5 with one rail SBA; standing balance on soft surface to facilitate ankle strategies (pt easily displaced posteriorly); alternate stepping to 4 inch step without UE support (single leg stance) 2 X 5 with decreased stance time on right LE ; wc mobility to room 50 feet before c/o fatigue.    Orpha Dain,JIM 08/16/2013, 4:00 PM

## 2013-08-17 ENCOUNTER — Inpatient Hospital Stay (HOSPITAL_COMMUNITY): Payer: No Typology Code available for payment source

## 2013-08-17 ENCOUNTER — Inpatient Hospital Stay (HOSPITAL_COMMUNITY): Payer: No Typology Code available for payment source | Admitting: Speech Pathology

## 2013-08-17 DIAGNOSIS — G35 Multiple sclerosis: Secondary | ICD-10-CM

## 2013-08-17 LAB — GLUCOSE, CAPILLARY
Glucose-Capillary: 108 mg/dL — ABNORMAL HIGH (ref 70–99)
Glucose-Capillary: 96 mg/dL (ref 70–99)
Glucose-Capillary: 122 mg/dL — ABNORMAL HIGH (ref 70–99)
Glucose-Capillary: 111 mg/dL — ABNORMAL HIGH (ref 70–99)

## 2013-08-17 MED ORDER — METFORMIN HCL 500 MG PO TABS
250.0000 mg | ORAL_TABLET | Freq: Two times a day (BID) | ORAL | Status: DC
  Administered 2013-08-17 (×2): 250 mg via ORAL
  Filled 2013-08-17 (×6): qty 1

## 2013-08-17 NOTE — Progress Notes (Signed)
Speech Language Pathology Daily Session Note  Patient Details  Name: [name] MRN: [id number] Date of Birth: 1976-09-25  Today's Date: 08/17/2013 Time: 1300-1330 Time Calculation (min): 30 min  Short Term Goals: Week 2: SLP Short Term Goal 1 (Week 2): Pt will utilize visual aids/cues to increase organization of functional information with Mod I verbal cues.  SLP Short Term Goal 2 (Week 2): Pt will demonstrate complex problem solving with functional and familiar tasks with Mod I.   Skilled Therapeutic Interventions: Treatment focus on pt/family education. Pt's mother and friend present throughout the session and educated on strategies to utilize at home to increase organization, problem solving, working Radio producer. Pt also educated on strategies to increase overall energy conservation.  The pt and her family/friends verbalized and demonstrated understanding of all information. Handouts also given to reinforce information.    FIM:  Comprehension Comprehension Mode: Auditory Comprehension: 6-Follows complex conversation/direction: With extra time/assistive device Expression Expression Mode: Verbal Expression: 7-Expresses complex ideas: With no assist Social Interaction Social Interaction: 6-Interacts appropriately with others with medication or extra time (anti-anxiety, antidepressant). Problem Solving Problem Solving: 6-Solves complex problems: With extra time Memory Memory: 6-More than reasonable amt of time FIM - Eating Eating Activity: 7: Complete independence:no helper  Pain Pain Assessment Pain Assessment: No/denies pain  Therapy/Group: Individual Therapy  Brookes Craine 08/17/2013, 3:41 PM

## 2013-08-17 NOTE — Progress Notes (Signed)
Subjective/Complaints: Overall pain is improved. Moved bowels this weekend. Tells me her mother is unable to assist her at discharge.   A 12 point review of systems has been performed and if not noted above is otherwise negative.   Objective: Vital Signs: Blood pressure 137/97, pulse 85, temperature 98.1 F (36.7 C), temperature source Oral, resp. rate 18, height 5\' 3"  (1.6 m), weight 105.098 kg (231 lb 11.2 oz), last menstrual period 03/17/2012, SpO2 97.00%. No results found. No results found for this basename: WBC, HGB, HCT, PLT,  in the last 72 hours No results found for this basename: NA, K, CL, CO, GLUCOSE, BUN, CREATININE, CALCIUM,  in the last 72 hours CBG (last 3)   Recent Labs  08/16/13 1632 08/16/13 2054 08/17/13 0733  GLUCAP 86 110* 108*    Wt Readings from Last 3 Encounters:  08/05/13 105.098 kg (231 lb 11.2 oz)  08/01/13 102.2 kg (225 lb 5 oz)  07/09/13 102.059 kg (225 lb)    Physical Exam:  Constitutional: She is oriented to person, place, and time. She appears well-developed and well-nourished. obese  HENT:  Head: Normocephalic and atraumatic.  Eyes: Conjunctivae are normal. Pupils are equal, round, and reactive to light.  Neck: Normal range of motion. Neck supple.  Cardiovascular: Normal rate and regular rhythm.  No murmur heard.  Pulmonary/Chest: Effort normal and breath sounds normal. No respiratory distress.  Abdominal: Soft. Bowel sounds are normal. She exhibits no distension. There is no tenderness.  Neurological: She is alert and oriented to person, place, and time.  Decreased visual acuity, with mild diplopia . She exhibits normal muscle tone. RUE is 4/5 deltoid, biceps, triceps, 4 to 4+ HI. LUE is 4+ to 5/5 in deltoid, biceps, triceps, HI. RLE is 3 to 3+/5 HF, 3 to 3+ KE and 2+ to 3 ankle. LLE is 4/5 HF, KE and 4/5 left ankle. Decreased pp/lt both legs to thigh and bilateral hands to wrist. Cognitively she displayed reasonable insight and awareness.  Attention was good. She is quite alert.  Cerebellar testing finger-nose-finger normal in the upper and lower limbs  Skin: Skin is warm and dry. No erythema.    Assessment/Plan: 1. Functional deficits secondary to MS exacerbation which require 3+ hours per day of interdisciplinary therapy in a comprehensive inpatient rehab setting. Physiatrist is providing close team supervision and 24 hour management of active medical problems listed below. Physiatrist and rehab team continue to assess barriers to discharge/monitor patient progress toward functional and medical goals. FIM: FIM - Bathing Bathing Steps Patient Completed: Chest;Right Arm;Left Arm;Abdomen;Front perineal area;Buttocks;Right upper leg;Left upper leg;Right lower leg (including foot);Left lower leg (including foot) Bathing: 6: Assistive device (Comment)  FIM - Upper Body Dressing/Undressing Upper body dressing/undressing steps patient completed: Thread/unthread right sleeve of pullover shirt/dresss;Thread/unthread left sleeve of pullover shirt/dress;Put head through opening of pull over shirt/dress;Pull shirt over trunk Upper body dressing/undressing: 5: Set-up assist to: Obtain clothing/put away FIM - Lower Body Dressing/Undressing Lower body dressing/undressing steps patient completed: Thread/unthread right underwear leg;Thread/unthread left underwear leg;Pull underwear up/down;Fasten/unfasten right shoe;Fasten/unfasten left shoe Lower body dressing/undressing: 6: Assistive device (Comment) (uses zipper shoes instead of laced)  FIM - Toileting Toileting steps completed by patient: Adjust clothing prior to toileting;Performs perineal hygiene;Adjust clothing after toileting Toileting Assistive Devices: Grab bar or rail for support Toileting: 5: Supervision: Safety issues/verbal cues  FIM - Diplomatic Services operational officer Devices: Grab bars Toilet Transfers: 5-To toilet/BSC: Supervision (verbal cues/safety  issues);5-From toilet/BSC: Supervision (verbal cues/safety issues)  FIM - Bed/Chair  Transport planner Devices: Environmental consultant;Arm rests Bed/Chair Transfer: 5: Bed > Chair or W/C: Supervision (verbal cues/safety issues);5: Chair or W/C > Bed: Supervision (verbal cues/safety issues)  FIM - Locomotion: Wheelchair Distance: 58' (with two rest breaks) Locomotion: Wheelchair: 1: Total Assistance/staff pushes wheelchair (Pt<25%) FIM - Locomotion: Ambulation Locomotion: Ambulation Assistive Devices: Designer, industrial/product Ambulation/Gait Assistance: 4: Min assist Locomotion: Ambulation: 1: Travels less than 50 ft with supervision/safety issues  Comprehension Comprehension Mode: Auditory Comprehension: 6-Follows complex conversation/direction: With extra time/assistive device  Expression Expression Mode: Verbal Expression: 7-Expresses complex ideas: With no assist  Social Interaction Social Interaction: 6-Interacts appropriately with others with medication or extra time (anti-anxiety, antidepressant).  Problem Solving Problem Solving: 5-Solves complex 90% of the time/cues < 10% of the time  Memory Memory: 6-More than reasonable amt of time  Medical Problem List and Plan:  1. DVT Prophylaxis/Anticoagulation: Pharmaceutical: Lovenox  2. Pain Management: tylenol prn often doesn't cover pain  -increased gabapentin to 600mg  q8.   -on low dose pamelor also  - hydrocodone  for breakthrough pain. Can have tramadol available as well.   -current regimen fairly effective. She's tolerating sedating SE 3. Mood: Provide ego support. LCSW to follow for evaluation.  4. Neuropsych: This patient is capable of making decisions on his own behalf.  5. DM type 2: Monitor with AC/HS cbg checks. Metformin at 500 mg bid---decrease to 250mg  as sugars actually running low. 6. Polyneuropathy: Will continue neurontin as above.  7. Insomnia:  trazodone prn.  8. Neurogenic bowel---senokot s 2 tabs at  night    LOS (Days) 12 A FACE TO FACE EVALUATION WAS PERFORMED  SWARTZ,ZACHARY T 08/17/2013 7:51 AM

## 2013-08-17 NOTE — Progress Notes (Signed)
Physical Therapy Session Note  Patient Details  Name: [name] MRN: [id number] Date of Birth: 07/20/1976  Today's Date: 08/17/2013 Time: 1415-1500 Time Calculation (min): 45 min  Short Term Goals: Week 2:  PT Short Term Goal 1 (Week 2): LTGs=STGs  Skilled Therapeutic Interventions/Progress Updates:  1:1. Focus on family training this tx session. Pt discussed w/ therapist at length regarding new d/c location, therefore not needing to negotiate up/down 1 flight of stairs as originally thought. Pt's mother and friend, Amil Amen, educated on and performed safe demonstration of stair negotiation, ambulation w/ and w/out RW, w/c propulsion, t/fs and car t/f w/ pt. Pt able to propel w/c x75' mod(I), amb x75' and 100' w/ RW and distant (S) as well as amb 25'x2 w/out AD and close (S), negotiate up/down 8 steps w/ B rail and up/down 4 steps w/ single rail using step-to pattern for energy conservation and close (S), perform SPT transfers w/out AD and close (S), perform car t/f w/out AD and close (S).  Pt verbalized understanding that short distance amb w/out use of AD was to be performed in home environment only when family/friend present to provide close (S). Pt's mother and friend verbally educated on falls/floor transfer, general safety and energy conservation. Berg Balance Test Performed, pt scored 50/56 and was educated on results, see detailed objective information below. Pt sitting in bed at end of session w/ all needs in reach and friend/family in room. Pt missed at end of tx session 2/2 fatigue.   Therapy Documentation Precautions:  Precautions Precautions: Fall Precaution Comments: LE's "give way" per patient. Restrictions Weight Bearing Restrictions: No General: Amount of Missed PT Time (min): 15 Minutes Missed Time Reason: Patient fatigue    Balance: Balance Balance Assessed: Yes Standardized Balance Assessment Standardized Balance Assessment: Berg Balance Test Berg Balance  Test Sit to Stand: Able to stand without using hands and stabilize independently Standing Unsupported: Able to stand safely 2 minutes Sitting with Back Unsupported but Feet Supported on Floor or Stool: Able to sit safely and securely 2 minutes Stand to Sit: Sits safely with minimal use of hands Transfers: Able to transfer safely, minor use of hands Standing Unsupported with Eyes Closed: Able to stand 10 seconds with supervision Standing Ubsupported with Feet Together: Able to place feet together independently and stand for 1 minute with supervision From Standing, Reach Forward with Outstretched Arm: Can reach forward >12 cm safely (5") From Standing Position, Pick up Object from Floor: Able to pick up shoe safely and easily From Standing Position, Turn to Look Behind Over each Shoulder: Looks behind from both sides and weight shifts well Turn 360 Degrees: Able to turn 360 degrees safely in 4 seconds or less Standing Unsupported, Alternately Place Feet on Step/Stool: Able to stand independently and complete 8 steps >20 seconds Standing Unsupported, One Foot in Front: Able to plae foot ahead of the other independently and hold 30 seconds Standing on One Leg: Able to lift leg independently and hold 5-10 seconds Total Score: 50  See FIM for current functional status  Therapy/Group: Individual Therapy  Denzil Hughes 08/17/2013, 4:11 PM

## 2013-08-17 NOTE — Progress Notes (Signed)
Occupational Therapy Session Note  Patient Details  Name: [name] MRN: [id number] Date of Birth: [birth year]  Today's Date: 08/17/2013 Time: 4098-1191 Time Calculation (min): 56 min  Short Term Goals: Week 2:  OT Short Term Goal 1 (Week 2): Patient will demonstrate ability to complete 30 min of UE arm ergometry to improve endurance with 1 rest break OT Short Term Goal 2 (Week 2): Patient will demonstrate ability to perform HEP to improve bil UE strength with supervision OT Short Term Goal 3 (Week 2): Patient will demonstrate ability to recover from incidental falls as instructed with supervision  OT Short Term Goal 4 (Week 2): Patient will verbally state 5 principles of energy conservation she will use to reduce potential for relapse of symptoms during ADL.  Skilled Therapeutic Interventions/Progress Updates: ADL-retraining with emphasis on improved independence with set-up and execution of unassisted bathing, discharge planning, functional mobility, and re-ed on principles of energy conservation.   Patient awake and alert, prepared for her morning ADL, now reduced to no more than 15 minutes bathing and 10 minutes of dressing to conserve energy.  Patient completed bathing and dressing tasks unassisted, with distant supervision for safety using her RW, shower chair, grab bars, and hand shower.  Patient alerted OT to newest development with discharge plans, as follows: 1) Daughter [name] will NOT provide needed assist for ADL d/t anxiety with history of psychological impairment (schizophrenia); 2) Mother will NOT remain with her and will be flying back to [place] d/t chronic illness and need for treatment.   Patient now reports need for discharge at independent level without potential for supervision.   Plan to focus on review of energy conservation skills and methods, adaptation and compensation to cycles of activity and rest, and falls prevention.     Therapy Documentation Precautions:   Precautions Precautions: Fall Precaution Comments: LE's "give way" per patient. Restrictions Weight Bearing Restrictions: No  Pain: Pain Assessment Pain Assessment: 0-10 Pain Score: 4  Pain Type: Acute pain Pain Location: Back Pain Descriptors / Indicators: Aching Pain Frequency: Occasional Pain Onset: Gradual Pain Intervention(s): Medication (See eMAR)  See FIM for current functional status  Therapy/Group: Individual Therapy  Second session: Time: 1330-1410 Time Calculation (min):  40 min  Pain Assessment: No report of pain  Skilled Therapeutic Interventions: Therapeutic activities with emphasis on family ed and training with patient's mother and a friend [name]) present.   Education session focussed on principles of energy conservation explored and incorporated during treatment session to include limiting ADL to 10-20 minutes, with multiple rest breaks to reduce episodes of exhaustion, dizziness, and muscular discomfort; seated ADL, use of AE such as reacher, long sponge, and foam tubing, use of DME to include w/c, tub bench, and grab bars; simplification of home making tasks, and falls prevention.   Patient and her friend reported plan to search for an acquire useful items such as swivel bar stool, wheeled cart for kitchen, a wheeled stool for bedroom, and large grip cooking utensils to compensate for weak or painful grip.   Patient affirmed that she has performed ADL consistently for approximately 1 week without need for physical assist to reach body parts while bathing or to perform dressing, toileting or grooming tasks, sitting and standing.  See FIM for current functional status  Therapy/Group: Individual Therapy  Georgeanne Nim 08/17/2013, 10:34 AM

## 2013-08-17 NOTE — Progress Notes (Signed)
Social Work Patient ID: [name], female   DOB: 09-16-1976, 37 y.o.   MRN: [id number]   Therapies reporting pt has made excellent progress and  goals upgraded to mod i level overall.  Pt reports that her mother will not be staying with her at d/c.  Therapies do not feel she needs someone with her 24/7 just intermittently.  Friend and husband can provide this.  Have alerted MD that team feels pt could d/c tomorrow (1 day early).  Will arrange f/u and DME.  Kenney Going, LCSW

## 2013-08-18 ENCOUNTER — Inpatient Hospital Stay (HOSPITAL_COMMUNITY): Payer: No Typology Code available for payment source

## 2013-08-18 ENCOUNTER — Inpatient Hospital Stay (HOSPITAL_COMMUNITY): Payer: No Typology Code available for payment source | Admitting: Speech Pathology

## 2013-08-18 DIAGNOSIS — G35 Multiple sclerosis: Secondary | ICD-10-CM

## 2013-08-18 LAB — GLUCOSE, CAPILLARY
Glucose-Capillary: 132 mg/dL — ABNORMAL HIGH (ref 70–99)
Glucose-Capillary: 101 mg/dL — ABNORMAL HIGH (ref 70–99)
Glucose-Capillary: 141 mg/dL — ABNORMAL HIGH (ref 70–99)

## 2013-08-18 MED ORDER — GABAPENTIN 300 MG PO CAPS: 600.0000 mg | ORAL_CAPSULE | Freq: Three times a day (TID) | ORAL | Status: DC

## 2013-08-18 MED ORDER — POTASSIUM CHLORIDE CRYS ER 20 MEQ PO TBCR: 20.0000 meq | EXTENDED_RELEASE_TABLET | Freq: Two times a day (BID) | ORAL | Status: DC

## 2013-08-18 MED ORDER — HYDROCODONE-ACETAMINOPHEN 5-325 MG PO TABS: 1.0000 | ORAL_TABLET | Freq: Four times a day (QID) | ORAL | Status: DC | PRN

## 2013-08-18 MED ORDER — NORTRIPTYLINE HCL 10 MG PO CAPS: 10.0000 mg | ORAL_CAPSULE | Freq: Every day | ORAL | Status: DC

## 2013-08-18 MED ORDER — DICLOFENAC SODIUM 1 % TD GEL: 2.0000 g | Freq: Four times a day (QID) | TRANSDERMAL | Status: DC

## 2013-08-18 MED ORDER — BACLOFEN 10 MG PO TABS: 5.0000 mg | ORAL_TABLET | Freq: Three times a day (TID) | ORAL | Status: DC

## 2013-08-18 MED ORDER — POLYETHYLENE GLYCOL 3350 17 G PO PACK: 17.0000 g | PACK | Freq: Two times a day (BID) | ORAL | Status: DC

## 2013-08-18 MED ORDER — BACLOFEN 5 MG HALF TABLET: 5.0000 mg | ORAL_TABLET | Freq: Three times a day (TID) | ORAL | Status: DC

## 2013-08-18 MED ORDER — TRAMADOL HCL 50 MG PO TABS: 50.0000 mg | ORAL_TABLET | Freq: Four times a day (QID) | ORAL | Status: DC | PRN

## 2013-08-18 NOTE — Progress Notes (Signed)
Occupational Therapy Session Note and Discharge Summary  Patient Details  Name: [name] MRN: [id number] Date of Birth: 08-24-1976  Today's Date: 08/18/2013 Time: 0730-0830 Time Calculation (min): 60 min  Skilled Therapeutic Interventions/Progress Updates: ADL-retraining on dynamic standing balance, energy conservation, home mod recommendations, and discharge planning.   Patient completed bathing/dressing unassisted using RW, LH sponge, and shower chair, and had planned to apply make-up after resting but discovered that her husband had removed her makeup in prep for discharge home this date.   Patient reviewed use of DME/AE for safety and verbalized the followign 3 strategies to prevent falls at home: 1) Take frequent rest breaks, limiting ADL to 15-30 minute sessions, 2) Use RW for mobility in home 3) Use furniture/accessories as advised to simplify meal prep in kitchen (swivel stool, wheeled cart).                                                                                   Discharge Summary  Patient has met 8 of 8 long term goals due to improved activity tolerance, improved balance, ability to compensate for deficits and functional use of  RIGHT upper extremity.  Patient to discharge at overall Modified Independent level.  Patient's care partner is independent to provide the necessary physical and cognitive assistance at discharge to address need for cues and reminders relating to symptom management and effective use of AE to conserve energy.    Reasons goals not met: N/A  Recommendation:  Patient would benefit from [facility] but is not eligible for [facility] or Outpatient OT services.  Equipment: RW, tub bench (recommended), LH sponge, thera-putty, foam tubing to improve grip for utensils  Reasons for discharge: treatment goals met  Patient/family agrees with progress made and goals achieved: Yes  OT Discharge Precautions/Restrictions  Precautions Precautions: Fall Precaution Comments:  history of falls when fatigued Restrictions Weight Bearing Restrictions: No  Vital Signs Therapy Vitals Temp: 98.1 F (36.7 C) Temp src: Oral Pulse Rate: 86 Resp: 18 BP: 108/54 mmHg Patient Position, if appropriate: Sitting Oxygen Therapy SpO2: 98 % O2 Device: None (Room air)  Pain Pain Assessment Pain Assessment: No/denies pain  ADL ADL Equipment Provided: Long-handled sponge Eating: Independent Where Assessed-Eating: Edge of bed Grooming: Independent Where Assessed-Grooming: Sitting at sink Upper Body Bathing: Modified independent Where Assessed-Upper Body Bathing: Shower Lower Body Bathing: Modified independent Where Assessed-Lower Body Bathing: Shower Upper Body Dressing: Independent Where Assessed-Upper Body Dressing: Edge of bed Lower Body Dressing: Independent Where Assessed-Lower Body Dressing: Edge of bed Toileting: Independent Where Assessed-Toileting: Teacher, adult education: Modified Community education officer Method: Surveyor, minerals: Grab bars;Other (comment) (walker) Tub/Shower Transfer: Modified independent Tub/Shower Transfer Method: Stand pivot Tub/Shower Equipment: Insurance underwriter: Modified independent Film/video editor Method: Warden/ranger: Shower seat without back  Vision/Perception  Vision - History Baseline Vision: Wears glasses all the time Visual History: Corrective eye surgery Patient Visual Report: Blurring of vision Vision - Assessment Eye Alignment: Within Functional Limits Perception Perception: Within Functional Limits Praxis Praxis: Intact   Cognition Overall Cognitive Status: Within Functional Limits for tasks assessed Arousal/Alertness: Awake/alert Orientation Level: Oriented X4 Attention: Divided Selective Attention: Appears  intact Alternating Attention: Appears intact Divided Attention: Appears intact Memory: Appears intact Awareness:  Appears intact Problem Solving: Appears intact Executive Function: Self Correcting Self Monitoring: Appears intact Self Correcting: Appears intact Safety/Judgment: Appears intact  Sensation Sensation Light Touch: Impaired Detail (LE tingling and numbness persists) Stereognosis: Appears Intact Hot/Cold: Appears Intact Proprioception: Appears Intact Coordination Gross Motor Movements are Fluid and Coordinated: Yes  Motor  Motor Motor: Within Functional Limits  Mobility  Bed Mobility Bed Mobility: Rolling Right;Rolling Left Rolling Right: 6: Modified independent (Device/Increase time) (HOB elevated) Rolling Left: 6: Modified independent (Device/Increase time) (HOB elevated) Supine to Sit: 7: Independent Sitting - Scoot to Edge of Bed: 7: Independent Sit to Supine: 7: Independent Transfers Transfers: Sit to Stand Sit to Stand: 6: Modified independent (Device/Increase time) (RW for stability) Stand to Sit: 6: Modified independent (Device/Increase time) (RW to stabilize)   Trunk/Postural Assessment  Postural Control Postural Control: Within Functional Limits   Balance Balance Balance Assessed: Yes Berg Balance Test Sit to Stand: Able to stand without using hands and stabilize independently Standing Unsupported: Able to stand safely 2 minutes Sitting with Back Unsupported but Feet Supported on Floor or Stool: Able to sit safely and securely 2 minutes Stand to Sit: Sits safely with minimal use of hands Transfers: Able to transfer safely, minor use of hands Standing Unsupported with Eyes Closed: Able to stand 10 seconds with supervision Standing Ubsupported with Feet Together: Able to place feet together independently and stand for 1 minute with supervision From Standing Position, Pick up Object from Floor: Able to pick up shoe safely and easily From Standing Position, Turn to Look Behind Over each Shoulder: Looks behind from both sides and weight shifts well Turn 360  Degrees: Able to turn 360 degrees safely in 4 seconds or less Standing Unsupported, Alternately Place Feet on Step/Stool: Able to stand independently and complete 8 steps >20 seconds Standing Unsupported, One Foot in Front: Able to plae foot ahead of the other independently and hold 30 seconds Standing on One Leg: Able to lift leg independently and hold 5-10 seconds Static Sitting Balance Static Sitting - Balance Support: No upper extremity supported;Feet supported Static Sitting - Level of Assistance: 5: Stand by assistance Dynamic Sitting Balance Dynamic Sitting - Balance Support: Feet supported Dynamic Sitting - Level of Assistance: 6: Modified independent (Device/Increase time) Dynamic Sitting - Balance Activities: Reaching across midline;Reaching for objects;Lateral lean/weight shifting;Forward lean/weight shifting  Extremity/Trunk Assessment RUE Assessment RUE Assessment: Within Functional Limits RUE Strength RUE Overall Strength: Within Functional Limits for tasks performed LUE Assessment LUE Assessment: Within Functional Limits  See FIM for current functional status  Georgeanne Nim 08/18/2013, 8:44 AM

## 2013-08-18 NOTE — Progress Notes (Signed)
Speech Language Pathology Session Note & Discharge Summary  Patient Details  Name: [name] MRN: [id number] Date of Birth: July 12, 1976  Today's Date: 08/18/2013 Time: 1000-1008 Time Calculation (min): 8 min  Skilled Therapeutic Intervention: Treatment focus on completion of patient education. Pt verbalized no further questions at this time and independently verbalized ways compensatory strategies will be implemented at home to increase working memory, organization, problem solving and overall safety. Pt will discharge home today.   Patient has met 2 of 2 long term goals.  Patient to discharge at overall Modified Independent level.   Reasons goals not met: N/A   Clinical Impression/Discharge Summary: Pt has made excellent gains and has met 2 of 2 LTG's this admission. Currently, pt is overall Mod I for complex problem solving, organization, working memory with utilization of compensatory strategies, anticipatory awareness and safety awareness.  Pt/family education complete and pt will discharge home with 24 hour supervision from friends and family. Recommend f/u outpatientSLP services to maximize cognitive function and overall functional independence.Unfortunately, pt unable to receive f/u therapy due to insurance limitations.     Care Partner:  Caregiver Able to Provide Assistance: Yes  Type of Caregiver Assistance: Physical;Cognitive  Recommendation:  24 hour supervision/assistance;Outpatient SLP  Rationale for SLP Follow Up: Maximize cognitive function and independence;Reduce caregiver burden   Equipment: N/A   Reasons for discharge: Treatment goals met;Discharged from hospital   Patient/Family Agrees with Progress Made and Goals Achieved: Yes   See FIM for current functional status  Jaydeen Darley 08/18/2013, 1:23 PM

## 2013-08-18 NOTE — Progress Notes (Signed)
Subjective/Complaints: Feeling better. Had a great day with therapies yesterday   A 12 point review of systems has been performed and if not noted above is otherwise negative.   Objective: Vital Signs: Blood pressure 108/54, pulse 86, temperature 98.1 F (36.7 C), temperature source Oral, resp. rate 18, height 5\' 3"  (1.6 m), weight 105.098 kg (231 lb 11.2 oz), last menstrual period 03/17/2012, SpO2 98.00%. No results found. No results found for this basename: WBC, HGB, HCT, PLT,  in the last 72 hours No results found for this basename: NA, K, CL, CO, GLUCOSE, BUN, CREATININE, CALCIUM,  in the last 72 hours CBG (last 3)   Recent Labs  08/17/13 1659 08/17/13 2052 08/18/13 0724  GLUCAP 122* 111* 132*    Wt Readings from Last 3 Encounters:  08/05/13 105.098 kg (231 lb 11.2 oz)  08/01/13 102.2 kg (225 lb 5 oz)  07/09/13 102.059 kg (225 lb)    Physical Exam:  Constitutional: She is oriented to person, place, and time. She appears well-developed and well-nourished. obese  HENT:  Head: Normocephalic and atraumatic.  Eyes: Conjunctivae are normal. Pupils are equal, round, and reactive to light.  Neck: Normal range of motion. Neck supple.  Cardiovascular: Normal rate and regular rhythm.  No murmur heard.  Pulmonary/Chest: Effort normal and breath sounds normal. No respiratory distress.  Abdominal: Soft. Bowel sounds are normal. She exhibits no distension. There is no tenderness.  Neurological: She is alert and oriented to person, place, and time.    . She exhibits normal muscle tone. RUE is 4/5 deltoid, biceps, triceps, 4 to 4+ HI. LUE is 4+ to 5/5 in deltoid, biceps, triceps, HI. RLE is   3+/5 HF,   3+ KE and 3 ankle. LLE is 4+/5 HF, KE and 4+/5 left ankle. Decreased pp/lt both legs to thigh and bilateral hands to wrist. Cognitively she displayed reasonable insight and awareness. Attention was good. She is quite alert.    Skin: Skin is warm and dry. No erythema.     Assessment/Plan: 1. Functional deficits secondary to MS exacerbation which require 3+ hours per day of interdisciplinary therapy in a comprehensive inpatient rehab setting. Physiatrist is providing close team supervision and 24 hour management of active medical problems listed below. Physiatrist and rehab team continue to assess barriers to discharge/monitor patient progress toward functional and medical goals. FIM: FIM - Bathing Bathing Steps Patient Completed: Chest;Right Arm;Left Arm;Abdomen;Front perineal area;Buttocks;Right upper leg;Left upper leg;Right lower leg (including foot);Left lower leg (including foot) Bathing: 6: Assistive device (Comment)  FIM - Upper Body Dressing/Undressing Upper body dressing/undressing steps patient completed: Thread/unthread right sleeve of pullover shirt/dresss;Thread/unthread left sleeve of pullover shirt/dress;Put head through opening of pull over shirt/dress;Pull shirt over trunk Upper body dressing/undressing: 7: Complete Independence: No helper FIM - Lower Body Dressing/Undressing Lower body dressing/undressing steps patient completed: Thread/unthread right underwear leg;Thread/unthread left underwear leg;Pull underwear up/down;Don/Doff right sock;Don/Doff left sock Lower body dressing/undressing: 7: Complete Independence: No helper  FIM - Toileting Toileting steps completed by patient: Adjust clothing prior to toileting;Performs perineal hygiene;Adjust clothing after toileting Toileting Assistive Devices: Grab bar or rail for support Toileting: 5: Supervision: Safety issues/verbal cues  FIM - Diplomatic Services operational officer Devices: Grab bars Toilet Transfers: 5-To toilet/BSC: Supervision (verbal cues/safety issues);5-From toilet/BSC: Supervision (verbal cues/safety issues)  FIM - Banker Devices: Walker;Arm rests Bed/Chair Transfer: 5: Bed > Chair or W/C: Supervision (verbal cues/safety  issues);5: Chair or W/C > Bed: Supervision (verbal cues/safety issues)  FIM - Locomotion:  Wheelchair Distance: 75' Locomotion: Wheelchair: 2: Travels 50 - 149 ft with supervision, cueing or coaxing FIM - Locomotion: Ambulation Locomotion: Ambulation Assistive Devices: Designer, industrial/product Ambulation/Gait Assistance: 5: Supervision Locomotion: Ambulation: 1: Travels less than 50 ft with supervision/safety issues  Comprehension Comprehension Mode: Auditory Comprehension: 6-Follows complex conversation/direction: With extra time/assistive device  Expression Expression Mode: Verbal Expression: 7-Expresses complex ideas: With no assist  Social Interaction Social Interaction: 6-Interacts appropriately with others with medication or extra time (anti-anxiety, antidepressant).  Problem Solving Problem Solving: 5-Solves complex 90% of the time/cues < 10% of the time  Memory Memory: 6-More than reasonable amt of time  Medical Problem List and Plan:  1. DVT Prophylaxis/Anticoagulation: Pharmaceutical: Lovenox  2. Pain Management: tylenol prn often doesn't cover pain  -increased gabapentin to 600mg  q8.   -continue low dose pamelor also  -hydrocodone  for breakthrough pain. Can have tramadol available as well.   -current regimen fairly effective. She's tolerating sedating SE 3. Mood: Provide ego support. LCSW to follow for evaluation.  4. Neuropsych: This patient is capable of making decisions on his own behalf.  5. DM type 2: Monitor with AC/HS cbg checks. Metformin at 500 mg bid---decreased to 250mg  yesterday---think she can stay off of this at home.  6. Polyneuropathy: Will continue neurontin as above.  7. Insomnia:  trazodone prn.  8. Neurogenic bowel---senokot s 2 tabs at night    LOS (Days) 13 A FACE TO FACE EVALUATION WAS PERFORMED  SWARTZ,ZACHARY T 08/18/2013 7:52 AM

## 2013-08-18 NOTE — Progress Notes (Signed)
Social Work  Discharge Note  The overall goal for the admission was met for:   Discharge location: Yes - home with family and friend to provide intermittent assistance (new home as they did move while pt in CIR.  Now living in [place], [place])  Length of Stay: Yes - 13 days  Discharge activity level: Yes - modified independent  Home/community participation: Yes  Services provided included: MD, RD, PT, OT, SLP, RN, TR, Pharmacy, Neuropsych and SW  Financial Services: Other: Medicaid application pending  Follow-up services arranged: DME: tub bench via [facility] and Patient/Family has no preference for HH/DME agencies  Comments (or additional information):  Recommendation that patient have brief follow with an outpatient rehab program, however, her Medicaid "pending" status will not cover any f/u services.  Pt aware and agreeable that no follow therapy arranged.  Patient/Family verbalized understanding of follow-up arrangements: Yes  Individual responsible for coordination of the follow-up plan: patient  Confirmed correct DME delivered: Marinus Eicher 08/18/2013    Jaimy Kliethermes

## 2013-08-18 NOTE — Progress Notes (Signed)
Patient discharge to home with mom at 1708.  Discharge instruction provided by Delle Reining, PA.  Patient verbalize understanding, no further questions ask.  Patient escorted off unit by rehab NT.

## 2013-08-18 NOTE — Progress Notes (Signed)
Physical Therapy Discharge Summary  Patient Details  Name: [name] MRN: [id number] Date of Birth: [birth year]  Today's Date: 08/18/2013 Time: 4098-1191 Time Calculation (min): 25 min  Patient has met 12 of 13 long term goals due to improved activity tolerance, improved balance, improved postural control, increased strength, decreased pain, ability to compensate for deficits and improved coordination.  Patient to discharge at primary level of modified independence to supervision during ambulation w/ RW, however, utilize w/c as needed 2/2 fatigue. Patient's care partner is independent to provide the necessary physical and cognitive assistance at discharge. Pt also able to direct assistance as needed.   Reasons goals not met: Pt did not achieve stair negotiation goal as d/c location changed just prior to d/c. However, pt is able to safely negotiate the number of steps in new home environment w/ Supervision.   Recommendation:  Patient will benefit from ongoing skilled PT services in outpatient setting to continue to advance safe functional mobility, address ongoing impairments in decreased balance, decreased functional endurance, decreased coordination, and minimize fall risk. Unfortunately, pt unable to receive f/u therapy 2/2 insurance limitations.   Equipment: No equipment provided  Reasons for discharge: treatment goals met and discharge from hospital  Patient/family agrees with progress made and goals achieved: Yes  Skilled Therapeutic Interventions 1:1. Focus this tx session on reassessment of pt's sensation, coordination, strength and functional mobility, see detailed information below. Pt able to demonstrate (I) bed mobility; mod(I) transfers, amb w/ RW, w/c propulsion, floor transfers as well as (S) during stair negotiation. Pt verbalized understanding that she can perform amb w/out walker only when (S) is present. Pt demonstrates good understanding regarding safety when fatigued such  as use of w/c instead of amb w/ RW. Pt also provided HEP to maintain/progress strength and balance, pt verbalized understanding of HEP. Pt sitting in bed at end of session w/ all needs in reach. Pt cleared for mod(I) w/ RW in room, RN aware.   PT Discharge Precautions/Restrictions Precautions Precautions: Fall Precaution Comments: history of falls when fatigued Restrictions Weight Bearing Restrictions: No Vital Signs   Pain Pain Assessment Pain Assessment: No/denies pain Vision/Perception  Vision - History Baseline Vision: Wears glasses all the time Patient Visual Report: Blurring of vision (Increased when fatigued) Vision - Assessment Perception Perception: Within Functional Limits Praxis Praxis: Intact  Cognition Overall Cognitive Status: Within Functional Limits for tasks assessed Arousal/Alertness: Awake/alert Orientation Level: Oriented X4 Attention: Divided Selective Attention: Appears intact Alternating Attention: Appears intact Divided Attention: Appears intact Memory: Appears intact Awareness: Appears intact Problem Solving: Appears intact Problem Solving Impairment: Functional basic;Functional complex Executive Function: Self Correcting Self Monitoring: Appears intact Self Correcting: Appears intact Safety/Judgment: Appears intact Sensation Sensation Light Touch: Impaired Detail Proprioception: Appears Intact Additional Comments: N/T and decreased light touch when fatigued, most often R LE> L LE Coordination Gross Motor Movements are Fluid and Coordinated: Yes Heel Shin Test: decreased speed and accuracy bilaterally, but R LE more impaired than L LE Motor  Motor Motor: Within Functional Limits  Mobility Bed Mobility Bed Mobility: Rolling Left;Supine to Sit;Sit to Supine;Sitting - Scoot to Edge of Bed Rolling Left: 7: Independent Supine to Sit: 7: Independent Sitting - Scoot to Edge of Bed: 7: Independent Sit to Supine: 7:  Independent Transfers Transfers: Yes Sit to Stand: 6: Modified independent (Device/Increase time) Stand to Sit: 6: Modified independent (Device/Increase time) Stand Pivot Transfers: 6: Modified independent (Device/Increase time) Locomotion  Ambulation Ambulation: Yes Ambulation/Gait Assistance: 6: Modified independent (Device/Increase time) Ambulation Distance (Feet):  100 Feet Assistive device: Rolling walker Ambulation/Gait Assistance Details: Pt able to amb shoter distances w/out use of AD only when (S) available Gait Gait: Yes (Appears close to baseline/normal gait pattern) Stairs / Additional Locomotion Stairs: Yes Stairs Assistance: 5: Supervision Stair Management Technique: One rail Left;Two rails Number of Stairs: 12 Height of Stairs: 6 Wheelchair Mobility Wheelchair Mobility: Yes Wheelchair Assistance: 6: Modified independent (Device/Increase time) Occupational hygienist: Both upper extremities Distance: 100;  Trunk/Postural Assessment  Postural Control Postural Control: Within Functional Limits  Balance Balance Balance Assessed: Yes Berg Balance Test Sit to Stand: Able to stand without using hands and stabilize independently Standing Unsupported: Able to stand safely 2 minutes Sitting with Back Unsupported but Feet Supported on Floor or Stool: Able to sit safely and securely 2 minutes Stand to Sit: Sits safely with minimal use of hands Transfers: Able to transfer safely, minor use of hands Standing Unsupported with Eyes Closed: Able to stand 10 seconds with supervision Standing Ubsupported with Feet Together: Able to place feet together independently and stand for 1 minute with supervision From Standing Position, Pick up Object from Floor: Able to pick up shoe safely and easily From Standing Position, Turn to Look Behind Over each Shoulder: Looks behind from both sides and weight shifts well Turn 360 Degrees: Able to turn 360 degrees safely in 4 seconds or  less Standing Unsupported, Alternately Place Feet on Step/Stool: Able to stand independently and complete 8 steps >20 seconds Standing Unsupported, One Foot in Front: Able to plae foot ahead of the other independently and hold 30 seconds Standing on One Leg: Able to lift leg independently and hold 5-10 seconds Static Sitting Balance Static Sitting - Balance Support: No upper extremity supported;Feet unsupported Static Sitting - Level of Assistance: 7: Independent Dynamic Sitting Balance Dynamic Sitting - Balance Support: Feet unsupported;Left upper extremity supported;Right upper extremity supported;No upper extremity supported Dynamic Sitting - Level of Assistance: 7: Independent Dynamic Sitting - Balance Activities: Reaching for objects Static Standing Balance Static Standing - Balance Support: No upper extremity supported;Left upper extremity supported;Right upper extremity supported Static Standing - Level of Assistance: 6: Modified independent (Device/Increase time) Dynamic Standing Balance Dynamic Standing - Balance Support: Left upper extremity supported;Right upper extremity supported Dynamic Standing - Level of Assistance: 6: Modified independent (Device/Increase time) Dynamic Standing - Balance Activities: Forward lean/weight shifting;Lateral lean/weight shifting;Reaching for objects Extremity Assessment  RLE Assessment RLE Assessment: Exceptions to [facility] RLE Strength Right Hip Flexion: 3+/5 Right Knee Flexion: 3+/5 Right Knee Extension: 3+/5 Right Ankle Dorsiflexion: 3+/5 Right Ankle Plantar Flexion: 3+/5 LLE Assessment LLE Assessment: Exceptions to [facility] LLE Strength Left Hip Flexion: 4/5 Left Knee Flexion: 4/5 Left Knee Extension: 4/5 Left Ankle Dorsiflexion: 4/5 Left Ankle Plantar Flexion: 4/5  See FIM for current functional status  Denzil Hughes 08/18/2013, 10:00 AM

## 2013-08-18 NOTE — Discharge Summary (Signed)
Physician Discharge Summary  Patient ID: [name] MRN: [id number] DOB/AGE: [birth year] 37 y.o.  Admit date: 08/05/2013 Discharge date: 08/18/2013  Discharge Diagnoses:  Principal Problem:   Multiple sclerosis exacerbation Active Problems:   DIABETES MELLITUS, TYPE II   OBESITY, MORBID   HYPERTENSION   Discharged Condition: Stable  Significant Diagnostic Studies: N/A  Labs:  Basic Metabolic Panel:  Recent Labs Lab 08/12/13 0535  NA 139  K 3.7  CL 101  CO2 28  GLUCOSE 110*  BUN 6  CREATININE 0.66  CALCIUM 9.3    CBC: No results found for this basename: WBC, NEUTROABS, HGB, HCT, MCV, PLT,  in the last 168 hours  CBG:  Recent Labs Lab 08/17/13 1659 08/17/13 2052 08/18/13 0724 08/18/13 1126 08/18/13 1620  GLUCAP 122* 111* 132* 101* 141*    Brief HPI:   [name] is a 37 y.o. female with history of DM, HTN, MS treated with Rebif (M-W-F) who was admitted on 08/01/13 with multiple falls with difficulty walking as well as burning pain/numbness in arms/legs and B/B incontinence. Followed by Dr. Anne Hahn but has not seen him since 2012. [name] Oral steroids ineffective and She was evaluated by Dr. Thad Ranger and started on IV solumedrol as well as baclofen for spasticity. Therapies initiated and CIR recomemded for progression.   Hospital Course: [name] was admitted to rehab 08/05/2013 for inpatient therapies to consist of PT, ST and OT at least three hours five days a week. Past admission physiatrist, therapy team and rehab RN have worked together to provide customized collaborative inpatient rehab. Blood pressure were monitored on bid basis and have been controlled. Diabetes was monitored with ac/hs checks and  Metformin dose has been adjusted due to low BS. Metformin was discontinued at discharge and patient is to follow up with her primary MD for resumption as indicated. Hypokalemia has  resolved with use of supplementation.  Pain control has improved on neurontin, elavil as well as prn tramadol. She is using vicodin on rare occasions for severe pain. She has made good progress during her stay and family can assist on prn basis.    Rehab course: During patient's stay in rehab weekly team conferences were held to monitor patient's progress, set goals and discuss barriers to discharge. Patient has had improvement in activity tolerance, balance, postural control, as well as ability to compensate for deficits. She is modified independent to supervision level for mobility. She is independent with bathing and dressing tasks. Speech therapy has worked with patient on memory strategies and she is modified independent for complex problem solving, organization, working [employer] fast streamer with utilization of compensatory strategies as well as anticipatory awareness and safety awareness   Disposition: Home  Diet: Diabetic  Special Instructions: 1. Follow up with Dr. Artist Pais in two weeks for post hospital check and evaluate need for metformin and/or K dur.        Future Appointments Provider Department Dept Phone   09/16/2013 12:00 PM Ranelle Oyster, MD [facility] Physical Medicine and Rehabilitation [phone]   09/28/2013 9:00 AM York Spaniel, MD [facility] [phone]       Medication List    STOP taking these medications       metFORMIN 500 MG tablet  Commonly known as:  GLUCOPHAGE      TAKE these medications       acetaminophen 325 MG tablet  Commonly known as:  TYLENOL  Take 2  tablets (650 mg total) by mouth every 6 (six) hours as needed.     baclofen 10 MG tablet  Commonly known as:  LIORESAL  Take 0.5 tablets (5 mg total) by mouth 3 (three) times daily. For spasticity     diclofenac sodium 1 % Gel  Commonly known as:  VOLTAREN  Apply 2 g topically 4 (four) times daily. To right shoulder and right elbow     gabapentin 300 MG capsule  Commonly known  as:  NEURONTIN  Take 2 capsules (600 mg total) by mouth every 8 (eight) hours. For nerve pain     HYDROcodone-acetaminophen 5-325 MG per tablet  Commonly known as:  NORCO/VICODIN  Take 1 tablet by mouth every 6 (six) hours as needed (for severe pain).     nortriptyline 10 MG capsule  Commonly known as:  PAMELOR  Take 1 capsule (10 mg total) by mouth at bedtime. For neuropathy/sleep     polyethylene glycol packet  Commonly known as:  MIRALAX / GLYCOLAX  Take 17 g by mouth 2 (two) times daily.     potassium chloride SA 20 MEQ tablet  Commonly known as:  K-DUR,KLOR-CON  Take 1 tablet (20 mEq total) by mouth 2 (two) times daily.     REBIF REBIDOSE [place]  Inject 8.8 mcg into the skin 3 (three) times a week.     traMADol 50 MG tablet  Commonly known as:  ULTRAM  Take 1 tablet (50 mg total) by mouth every 6 (six) hours as needed (pain).       Follow-up Information   Follow up with Ranelle Oyster, MD On 09/16/2013. (Be there at 11:30    for noon  appointment)    Specialty:  Physical Medicine and Rehabilitation   Contact information:   [address], [address] [place] [address] [phone]       Follow up with [facility] On 09/28/2013. (Have an appointment at 9 am.)    Contact information:   [address] [place] [address] [phone]      Signed: Jacquelynn Cree 08/18/2013, 5:04 PM

## 2013-08-24 ENCOUNTER — Encounter: Payer: Self-pay | Admitting: Neurology

## 2013-09-16 ENCOUNTER — Encounter: Payer: Self-pay | Admitting: Physical Medicine & Rehabilitation

## 2013-09-16 ENCOUNTER — Encounter
Payer: No Typology Code available for payment source | Attending: Physical Medicine & Rehabilitation | Admitting: Physical Medicine & Rehabilitation

## 2013-09-16 VITALS — BP 149/93 | HR 85 | Resp 14 | Ht 63.0 in | Wt 225.6 lb

## 2013-09-16 DIAGNOSIS — R269 Unspecified abnormalities of gait and mobility: Secondary | ICD-10-CM | POA: Insufficient documentation

## 2013-09-16 DIAGNOSIS — G35 Multiple sclerosis: Secondary | ICD-10-CM

## 2013-09-16 DIAGNOSIS — G609 Hereditary and idiopathic neuropathy, unspecified: Secondary | ICD-10-CM

## 2013-09-16 DIAGNOSIS — R350 Frequency of micturition: Secondary | ICD-10-CM

## 2013-09-16 DIAGNOSIS — G5793 Unspecified mononeuropathy of bilateral lower limbs: Secondary | ICD-10-CM

## 2013-09-16 DIAGNOSIS — R32 Unspecified urinary incontinence: Secondary | ICD-10-CM | POA: Insufficient documentation

## 2013-09-16 DIAGNOSIS — R209 Unspecified disturbances of skin sensation: Secondary | ICD-10-CM

## 2013-09-16 MED ORDER — TRAMADOL HCL 50 MG PO TABS: 50.0000 mg | ORAL_TABLET | Freq: Four times a day (QID) | ORAL | Status: DC | PRN

## 2013-09-16 MED ORDER — HYDROCODONE-ACETAMINOPHEN 5-325 MG PO TABS: 1.0000 | ORAL_TABLET | Freq: Four times a day (QID) | ORAL | Status: DC | PRN

## 2013-09-16 MED ORDER — NORTRIPTYLINE HCL 25 MG PO CAPS: 25.0000 mg | ORAL_CAPSULE | Freq: Every day | ORAL | Status: DC

## 2013-09-16 NOTE — Patient Instructions (Signed)
CALL ME WITH ANY PROBLEMS OR QUESTIONS (#[phone]).  HAVE A GOOD DAY  

## 2013-09-16 NOTE — Progress Notes (Signed)
Subjective:    Patient ID: [name], female    DOB: [birth year], 37 y.o.   MRN: [id number]  HPI  [name] is back regarding her MS and gait disorder. She moved into a big, new house about 2 weeks ago and that has added to her physical and mental stress.  She tends to wax and wane regarding her strength and balance. She sometimes will need the walker on a bad day, once a week or so.   Typically, she has a harder time the nights when she's had a lot of pain and she didn't sleep. These bad nights are happing 2-3 times per week---usually once a week the episode is severe. The pain starts in her low back and shoots into both of her feet.   She is taking the gabapentin 600mg  tid and pamelor 10mg  qhs. She uses the hydrocodone only for severe pain, maybe once or twice per week. She uses the tramadol twice daily typically.   [name] notes daily incontinence, typically once per day where she is incontinent of a small amount of urine when she bends over or stands---typically she doesn't know she's emptied until she's wet. It has been better since early on in the hospital.    Her other concern is bright red blood in her stool which she associates with the use of her rebif. She sees neuro later this month for follow up. She doesn't feel that her evening neuro pain is associated with the rebif however she does feel some flu like symptoms with it.   Pain Inventory Average Pain 7 Pain Right Now 6 My pain is sharp  In the last 24 hours, has pain interfered with the following? General activity 8 Relation with others 8 Enjoyment of life 7 What TIME of day is your pain at its worst? night Sleep (in general) Poor  Pain is worse with: walking, bending, standing, some activites and other Pain improves with: rest, heat/ice and medication Relief from Meds: na  Mobility walk without assistance walk with assistance use a cane how many minutes can you walk? 2-3 ability to climb steps?  yes do you drive?   no use a wheelchair needs help with transfers transfers alone  Function disabled: date disabled 05-22-2013 I need assistance with the following:  bathing, meal prep, household duties and shopping  Neuro/Psych bladder control problems weakness numbness tremor tingling trouble walking dizziness confusion loss of taste or smell  Prior Studies Any changes since last visit?  no  Physicians involved in your care Any changes since last visit?  no   Family History  Problem Relation Age of Onset  . Diabetes Mother   . Depression Mother   . Hypertension Mother   . Liver disease Father   . Depression Daughter     bipolar depression  . Coronary artery disease Other   . Cancer Other     ovarian  . Anesthesia problems Neg Hx    History   Social History  . Marital Status: Married    Spouse Name: Barbara Cower    Number of Children: 2  . Years of Education: BA   Occupational History  . 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 Smoking status: Never Smoker   . Smokeless tobacco: Never Used  . Alcohol Use: Yes  . Drug Use: No  . Sexual Activity: Not Currently    Birth Control/ Protection: Surgical   Other Topics Concern  . None   Social History Narrative  . None  Past Surgical History  Procedure Laterality Date  . Cholecystectomy    . Dilation and curettage of uterus    . Laparoscopic gastric banding  01/26/08  . Cesarean section    . Iud removal  03/19/2012    denies  . Colon surgery    . Abdominal hysterectomy     Past Medical History  Diagnosis Date  . Asthma   . Diabetes mellitus   . Hypertension   . PCOS (polycystic ovarian syndrome)   . Gastroparesis   . Polyneuropathy   . Multiple sclerosis     Dr. Anne Hahn  . Multiple sclerosis, primary progressive 2010  . Obesity   . Neurogenic bladder   . Neurogenic bowel    BP 149/93  Pulse 85  Resp 14  Ht 5\' 3"  (1.6 m)  Wt 225 lb 9.6 oz (102.331 kg)  BMI 39.97 kg/m2  SpO2 96%  LMP  03/17/2012      Review of Systems  Constitutional: Positive for diaphoresis.  HENT:       Loss of taste or smell  Gastrointestinal: Positive for nausea.  Genitourinary:       Bladder control problems  Neurological: Positive for dizziness, tremors, weakness and numbness.       Tingling  Psychiatric/Behavioral: Positive for confusion and dysphoric mood. The patient is nervous/anxious.   All other systems reviewed and are negative.       Objective:   Physical Exam Constitutional: She is oriented to person, place, and time. She appears well-developed and well-nourished. obese  HENT:  Head: Normocephalic and atraumatic.  Eyes: Conjunctivae are normal. Pupils are equal, round, and reactive to light.  Neck: Normal range of motion. Neck supple.  Cardiovascular: Normal rate and regular rhythm.  No murmur heard.  Pulmonary/Chest: Effort normal and breath sounds normal. No respiratory distress.  Abdominal: Soft. Bowel sounds are normal. She exhibits no distension. There is no tenderness.  Neurological: She is alert and oriented to person, place, and time.  . Strength near 5/5. FMC slightly diminished LUE more than right. No focal sensory findings. DTR's 2+.  Cognitively she displayed good insight and awareness. Attention was good. She is quite alert. She walked in her boots without assistance. She showed excellent balance. Changed directions without difficulty.  Skin: Skin is warm and dry. No erythema.   Assessment/Plan:  1. Functional deficits secondary to MS exacerbation   -rebif per neurology. She needs to discuss with them regarding her bleeding. It seems rather limited at this point, and she can wait until her follow up appt with Dr. Anne Hahn.  2. Pain Management: tylenol prn often doesn't cover pain  -continue gabapentin to 600mg  q8--consider increasing hs dose  -increase pamelor to 25mg  qhs, can go up to 50mg  if needed  -hydrocodone for breakthrough pain. Can have tramadol  available as well. -refilled both #30 and #120today  -consider other agents if persistent or worsening   3. Urinary incontinence.  -observe for effects of pamelor on bladder. If it remains a problem we can look at a direct anticholineric agent although I would prefer to avoid. Overall it's better than while she was in the hospital.  -consider urology eval 4. Follow up in 2 months. 30 minutes of face to face patient care time were spent during this visit. All questions were encouraged and answered.

## 2013-09-24 ENCOUNTER — Telehealth: Payer: Self-pay | Admitting: Neurology

## 2013-09-24 NOTE — Telephone Encounter (Signed)
Called patient to remind of an appointment 09/28/13 at 9:00 with Dr. Weston Brass not reach patient, invalid phone #

## 2013-09-28 ENCOUNTER — Ambulatory Visit: Payer: Self-pay | Admitting: Neurology

## 2013-09-28 ENCOUNTER — Telehealth: Payer: Self-pay | Admitting: Neurology

## 2013-09-28 NOTE — Telephone Encounter (Signed)
Spoke to patient and she had to cancel her appointment today due to severe pain and cannot get out of bed.  Said she has sharp, stabbing pain in her spine and legs and her feet are numb.  She was last seen 06-2011 and was released from the hospital in September where she spent 3 weeks in rehab.  She asks if the doctor can do something.  [phone]

## 2013-09-28 NOTE — Telephone Encounter (Signed)
I called patient. The patient canceled her revisit appointment today, now she calls back wanting me to do something for her discomfort. The patient has not been seen through this office since July 2012. The patient recently was in the hospital, admitted on 08/05/2013. MRI evaluation has not showed any changes on the brain since 2011, and MRI of the cervical and thoracic spinal cord does not show MS lesions. I will need to see this patient in revisit. I will set up another revisit.

## 2013-10-08 ENCOUNTER — Other Ambulatory Visit: Payer: Self-pay

## 2013-10-14 ENCOUNTER — Ambulatory Visit (INDEPENDENT_AMBULATORY_CARE_PROVIDER_SITE_OTHER): Payer: Self-pay | Admitting: Neurology

## 2013-10-14 ENCOUNTER — Encounter: Payer: Self-pay | Admitting: Neurology

## 2013-10-14 VITALS — BP 132/96 | HR 96 | Wt 228.0 lb

## 2013-10-14 DIAGNOSIS — R209 Unspecified disturbances of skin sensation: Secondary | ICD-10-CM

## 2013-10-14 DIAGNOSIS — G35 Multiple sclerosis: Secondary | ICD-10-CM

## 2013-10-14 NOTE — Patient Instructions (Signed)
Multiple Sclerosis Multiple sclerosis (MS) is a disease of the central nervous system. Its cause is unknown. It is more common in the northern states than in the southern states. There is a higher incidence of MS in women. There is a wide variation in the symptoms (problems) of MS. This is because of the many different ways it affects the central nervous system. It often comes on in episodes or attacks. These attacks may last weeks to months. There may be long periods of nearly no problems between attacks. The main symptoms include visual problems (associated with eye pain), numbness, weakness, and paralysis in extremities (arms/hands and legs/feet). There may also be tremors and problems with balance and walking. The age when MS starts is variable. Advances in medicine continue to improve the treatment of this illness. There is no known cure for MS but there are medications that help. MS is not an inherited illness, although your risk of getting this disease is higher if you have a relative with MS. The best radiologic (x-ray) study for MS is an MRI (magnetic resonance imaging). There are medications available to decrease the number and frequency of attacks. SYMPTOMS  The symptoms of MS are caused by loss of insulation (myelin) of the nerves of the brain. When this happens, brain signals do not get transmitted properly or may not get transmitted at all. Some of the problems caused by this include:   Numbness.  Weakness.  Paralysis in extremities.  Visual problems, eye pain.  Balance problems.  Tremors. DIAGNOSIS  Your caregiver can do studies on you to make this diagnosis. This may include specialized X-rays and spinal fluid studies. HOME CARE INSTRUCTIONS   Take medications as directed by your caregiver. Baclofen is a drug commonly used to reduce muscle spasticity. Steroids are often used for short term relief.  Exercise as directed.  Use physical and occupational therapy as directed by  your caregiver. Careful attention to this medical care can help avoid depression.  See your caregiver if you begin to have problems with depression. This is a common problem in MS. Patients often continue to work many years after the diagnosis of MS. Document Released: 11/16/2000 Document Revised: 02/11/2012 Document Reviewed: 06/25/2007 [employer] Patient Information 2014 [employer].  

## 2013-10-14 NOTE — Progress Notes (Signed)
Reason for visit: Multiple sclerosis  [name] is an 37 y.o. female  History of present illness:  Ms. [name] is a 37 year old right-handed black female with a history of multiple sclerosis. The patient was last seen through this office in July of 2012. The patient was on Tysabri, but she could not tolerate this, and then she was switched to Gilenya. The Gilenya was well-tolerated, but the patient lost her medical insurance, and went off the medication in July of 2013. The patient never followed back up through this office. The patient indicated that in late August of 2014, she began having numbness throughout the body. The patient reported problems with urinary and bowel incontinence, and difficulty with walking. The patient went into the hospital around 08/05/2013. MRI evaluation of the entire neural axis was done to include the brain, cervical, thoracic, and lumbosacral spine. A chronic stable right thalamic lesion was noted, and MRI evaluation of the brain was stable from 2011. No evidence of any spinal cord lesions were noted. The patient underwent some physical therapy, and she has improved some. The patient was placed on Rebif. The patient is having some flulike symptoms from this. The patient also reports some blood in the stools with bright red blood on occasion. The patient reports numbness and tingling sensations in the hands and feet, and she has a history of diabetes. A thyroid profile and a vitamin B12 level have been done recently and were unremarkable. The patient comes to this office for an evaluation.  Past Medical History  Diagnosis Date  . Asthma   . Diabetes mellitus   . Hypertension   . PCOS (polycystic ovarian syndrome)   . Gastroparesis   . Polyneuropathy   . Multiple sclerosis     Dr. Anne Hahn  . Multiple sclerosis, primary progressive 2010  . Obesity   . Neurogenic bladder   . Neurogenic bowel   . Obesity   . Migraine headache     Past Surgical History    Procedure Laterality Date  . Cholecystectomy    . Dilation and curettage of uterus    . Laparoscopic gastric banding  01/26/08  . Cesarean section    . Iud removal  03/19/2012    denies  . Colon surgery    . Abdominal hysterectomy      Family History  Problem Relation Age of Onset  . Diabetes Mother   . Depression Mother   . Hypertension Mother   . Liver disease Father   . Depression Daughter     bipolar depression  . Coronary artery disease Other   . Cancer Other     ovarian  . Anesthesia problems Neg Hx     Social history:  reports that she has never smoked. She has never used smokeless tobacco. She reports that she drinks alcohol. She reports that she does not use illicit drugs.    Allergies  Allergen Reactions  . Metformin And Related     Diarrhea with higher doses    Medications:  Current Outpatient Prescriptions on File Prior to Visit  Medication Sig Dispense Refill  . acetaminophen (TYLENOL) 325 MG tablet Take 2 tablets (650 mg total) by mouth every 6 (six) hours as needed.      . baclofen (LIORESAL) 10 MG tablet Take 0.5 tablets (5 mg total) by mouth 3 (three) times daily. For spasticity  90 tablet  1  . diclofenac sodium (VOLTAREN) 1 % GEL Apply 2 g topically 4 (four) times daily. To  right shoulder and right elbow  4 Tube  1  . gabapentin (NEURONTIN) 300 MG capsule Take 2 capsules (600 mg total) by mouth every 8 (eight) hours. For nerve pain  180 capsule  1  . HYDROcodone-acetaminophen (NORCO/VICODIN) 5-325 MG per tablet Take 1 tablet by mouth every 6 (six) hours as needed (for severe pain).  30 tablet  0  . potassium chloride SA (K-DUR,KLOR-CON) 20 MEQ tablet Take 1 tablet (20 mEq total) by mouth 2 (two) times daily.  60 tablet  1  . traMADol (ULTRAM) 50 MG tablet Take 1 tablet (50 mg total) by mouth every 6 (six) hours as needed (pain).  120 tablet  0   No current facility-administered medications on file prior to visit.    ROS:  Out of a complete 14  system review of symptoms, the patient complains only of the following symptoms, and all other reviewed systems are negative.  Fatigue Ringing in the ears Blurred vision Blood in the stool, incontinence of bowel Incontinence of bladder Memory loss, confusion, headache, numbness, weakness, dizziness, tremor Insomnia, restless legs  Blood pressure 132/96, pulse 96, weight 228 lb (103.42 kg), last menstrual period 03/17/2012.  Physical Exam  General: The patient is alert and cooperative at the time of the examination. The patient is markedly obese.  Skin: No significant peripheral edema is noted.   Neurologic Exam  Mental status: The patient is oriented x 3.  Cranial nerves: Facial symmetry is present. Speech is normal, no aphasia or dysarthria is noted. Extraocular movements are full. Visual fields are full. Pupils are equal, round, and reactive to light. Discs are flat bilaterally.  Motor: The patient has good strength in all 4 extremities.  Sensory examination: Pinprick sensation is symmetric on the face, arms, and legs. A stocking pattern pinprick sensory deficit is noted one half way up the legs bilaterally.  Coordination: The patient has good finger-nose-finger and heel-to-shin bilaterally.  Gait and station: The patient has a normal gait. Tandem gait is normal. Romberg is negative. No drift is seen.  Reflexes: Deep tendon reflexes are symmetric, but are depressed.   Assessment/Plan:  One. Multiple sclerosis  2. Diabetes  The patient indicates that she has numbness in the hands and feet. A nerve conduction study will be done on both legs, and one arm. Blood work will be done today. The patient is on Rebif, and she will remain on this medication. The patient will followup in 4 or 5 months. The clinical examination objectively is normal at this time.  Marlan Palau MD 10/14/2013 8:18 PM  [facility] [address] [place], [place]  [address]  Phone [phone] Fax [phone]

## 2013-10-15 LAB — COMPREHENSIVE METABOLIC PANEL
ALT: 28 IU/L (ref 0–32)
AST: 18 IU/L (ref 0–40)
Albumin/Globulin Ratio: 1.5 (ref 1.1–2.5)
Albumin: 4.4 g/dL (ref 3.5–5.5)
Alkaline Phosphatase: 65 IU/L (ref 39–117)
BUN/Creatinine Ratio: 17 (ref 8–20)
BUN: 9 mg/dL (ref 6–20)
CO2: 28 mmol/L (ref 18–29)
Calcium: 8.8 mg/dL (ref 8.7–10.2)
Chloride: 100 mmol/L (ref 96–108)
Creatinine, Ser: 0.54 mg/dL — ABNORMAL LOW (ref 0.57–1.00)
GFR calc Af Amer: 140 mL/min/{1.73_m2} (ref >59)
GFR calc non Af Amer: 122 mL/min/{1.73_m2} (ref >59)
Globulin, Total: 3 g/dL (ref 1.5–4.5)
Glucose: 89 mg/dL (ref 65–99)
Potassium: 4.2 mmol/L (ref 3.5–5.2)
Sodium: 137 mmol/L (ref 134–144)
Total Bilirubin: 0.3 mg/dL (ref 0.0–1.2)
Total Protein: 7.4 g/dL (ref 6.0–8.5)

## 2013-10-15 NOTE — Progress Notes (Signed)
Quick Note:  Spoke to patient and relayed unremarkable blood work results, and normal liver enzymes, per Dr. Anne Hahn. ______

## 2013-11-06 ENCOUNTER — Telehealth: Payer: Self-pay | Admitting: Neurology

## 2013-11-06 ENCOUNTER — Ambulatory Visit (INDEPENDENT_AMBULATORY_CARE_PROVIDER_SITE_OTHER): Payer: Self-pay

## 2013-11-06 DIAGNOSIS — R209 Unspecified disturbances of skin sensation: Secondary | ICD-10-CM

## 2013-11-06 DIAGNOSIS — G35 Multiple sclerosis: Secondary | ICD-10-CM

## 2013-11-06 NOTE — Procedures (Signed)
     HISTORY:  [name] is a 37 year old patient with a history of multiple sclerosis and diabetes who reports problems with numbness and paresthesias involving all 4 extremities. The patient is being evaluated for a possible peripheral neuropathy.  NERVE CONDUCTION STUDIES:  Nerve conduction studies were performed on the right upper extremity. The distal motor latencies and motor amplitudes for the median and ulnar nerves were within normal limits. The F wave latencies and nerve conduction velocities for these nerves were also normal. The sensory latencies for the median and ulnar nerves were normal.  Nerve conduction studies were performed on both lower extremities. The distal motor latencies and motor amplitudes for the peroneal and posterior tibial nerves were within normal limits. The nerve conduction velocities for these nerves were also normal. The sensory latencies for the peroneal nerves were within normal limits.   EMG STUDIES:  EMG study was not performed.  IMPRESSION:  Nerve conduction studies done on the right upper extremity and both lower extremities were within normal limits. No evidence of a peripheral neuropathy is seen. A small fiber neuropathy however, may be missed by standard nerve conduction studies. Clinical correlation is required.  Marlan Palau MD 11/06/2013 2:25 PM  [facility] [address] [place], [place] [address]  Phone [phone] Fax [phone]

## 2013-11-06 NOTE — Telephone Encounter (Signed)
5 called patient with the results of the nerve conduction study, unable to reach her at the moment. Nerve conduction studies were normal. No evidence of a peripheral neuropathy.

## 2013-11-13 ENCOUNTER — Encounter: Payer: No Typology Code available for payment source | Admitting: Physical Medicine & Rehabilitation

## 2013-11-20 ENCOUNTER — Telehealth: Payer: Self-pay

## 2013-11-20 MED ORDER — BACLOFEN 10 MG PO TABS: 5.0000 mg | ORAL_TABLET | Freq: Three times a day (TID) | ORAL | Status: DC

## 2013-11-20 NOTE — Telephone Encounter (Signed)
Baclofen refill reqeust from [facility].  Patient is taking 1/2 tablet po tid prn spasms.  Please advise.

## 2013-11-20 NOTE — Telephone Encounter (Signed)
Refill e-scribed to [facility]

## 2013-12-15 ENCOUNTER — Encounter: Payer: Self-pay | Admitting: Neurology

## 2013-12-21 ENCOUNTER — Encounter
Payer: No Typology Code available for payment source | Attending: Physical Medicine & Rehabilitation | Admitting: Physical Medicine & Rehabilitation

## 2013-12-21 DIAGNOSIS — G35 Multiple sclerosis: Secondary | ICD-10-CM | POA: Insufficient documentation

## 2013-12-21 DIAGNOSIS — R32 Unspecified urinary incontinence: Secondary | ICD-10-CM | POA: Insufficient documentation

## 2013-12-21 DIAGNOSIS — R269 Unspecified abnormalities of gait and mobility: Secondary | ICD-10-CM | POA: Insufficient documentation

## 2014-02-12 ENCOUNTER — Ambulatory Visit (INDEPENDENT_AMBULATORY_CARE_PROVIDER_SITE_OTHER): Payer: 59 | Admitting: Nurse Practitioner

## 2014-02-12 ENCOUNTER — Encounter: Payer: Self-pay | Admitting: Nurse Practitioner

## 2014-02-12 VITALS — BP 138/94 | HR 96 | Ht 64.0 in | Wt 244.0 lb

## 2014-02-12 DIAGNOSIS — G609 Hereditary and idiopathic neuropathy, unspecified: Secondary | ICD-10-CM

## 2014-02-12 DIAGNOSIS — G5793 Unspecified mononeuropathy of bilateral lower limbs: Secondary | ICD-10-CM

## 2014-02-12 DIAGNOSIS — G35 Multiple sclerosis: Secondary | ICD-10-CM

## 2014-02-12 DIAGNOSIS — R209 Unspecified disturbances of skin sensation: Secondary | ICD-10-CM

## 2014-02-12 MED ORDER — TRAMADOL HCL 50 MG PO TABS: 50.0000 mg | ORAL_TABLET | Freq: Four times a day (QID) | ORAL | Status: DC | PRN

## 2014-02-12 MED ORDER — TOPIRAMATE 25 MG PO TABS: ORAL_TABLET | ORAL | Status: DC

## 2014-02-12 NOTE — Patient Instructions (Addendum)
Will begin Topamax 25 mg hs for 1 week then increase by 25mg  weekly until dose is 100mg . Continue Rebif Will renew Ultram F/U in 6 months

## 2014-02-12 NOTE — Progress Notes (Signed)
[facility]  PATIENT: [name] DOB: 1976-06-16   REASON FOR VISIT: follow up MS   HISTORY OF PRESENT ILLNESS:Ms [name], 38 year old female returns for followup. She was last seen in this office by Dr. Jannifer Franklin 10/14/2013. At that time she was having numbness and paresthesias in all 4 extremities. Nerve conduction study in the right upper extremity and both lower extremities  was within normal limits. She is currently on Rebif and she gets her medication through patient assistance. She continues to have some flulike symptoms occasionally. She lost her insurance in July of 2013. She lives in [facility] which is about an hour from our office. She would like to be referred to a closer facility for neurology care. On today's visit she is having migraine headaches. She was seen in the ER in [place] and given a shot for her migraines. Her mother was recently diagnosed with Alzheimer's disease. She has never been on any preventive medications although she is on nortriptyline at night. She returns for reevaluation.  HISTORY: of multiple sclerosis. The patient was last seen through this office in July of 2012. The patient was on Tysabri, but she could not tolerate this, and then she was switched to [place]. The [place] was well-tolerated, but the patient lost her medical insurance, and went off the medication in July of 2013. The patient never followed back up through this office. The patient indicated that in late August of 2014, she began having numbness throughout the body. The patient reported problems with urinary and bowel incontinence, and difficulty with walking. The patient went into the hospital around 08/05/2013. MRI evaluation of the entire neural axis was done to include the brain, cervical, thoracic, and lumbosacral spine. A chronic stable right thalamic lesion was noted, and MRI evaluation of the brain was stable from 2011. No evidence of any spinal cord lesions  were noted. The patient underwent some physical therapy, and she has improved some. The patient was placed on Rebif. The patient is having some flulike symptoms from this. The patient also reports some blood in the stools with bright red blood on occasion. The patient reports numbness and tingling sensations in the hands and feet, and she has a history of diabetes. A thyroid profile and a vitamin B12 level have been done recently and were unremarkable. The patient comes to this office for an evaluation.  REVIEW OF SYSTEMS: Full 14 system review of systems performed and notable only for those listed, all others are neg:  Constitutional: Fatigue Cardiovascular: N/A  Ear/Nose/Throat: N/A  Skin: N/A  Eyes: N/A  Respiratory: N/A  Gastroitestinal: Incontinence of bladder and occasional bowels  Hematology/Lymphatic: N/A  Endocrine: N/A Musculoskeletal:N/A  Allergy/Immunology: N/A  Neurological: Memory loss Psychiatric: Anxiety   ALLERGIES: Allergies  Allergen Reactions  . Metformin And Related     Diarrhea with higher doses    HOME MEDICATIONS: Outpatient Prescriptions Prior to Visit  Medication Sig Dispense Refill  . acetaminophen (TYLENOL) 325 MG tablet Take 2 tablets (650 mg total) by mouth every 6 (six) hours as needed.      . baclofen (LIORESAL) 10 MG tablet Take 0.5 tablets (5 mg total) by mouth 3 (three) times daily. For spasticity  90 tablet  3  . diclofenac sodium (VOLTAREN) 1 % GEL Apply 2 g topically 4 (four) times daily. To right shoulder and right elbow  4 Tube  1  . gabapentin (NEURONTIN) 300 MG capsule Take 2 capsules (600 mg total) by mouth every 8 (  eight) hours. For nerve pain  180 capsule  1  . HYDROcodone-acetaminophen (NORCO/VICODIN) 5-325 MG per tablet Take 1 tablet by mouth every 6 (six) hours as needed (for severe pain).  30 tablet  0  . interferon beta-1a (REBIF) 44 MCG/0.5ML injection Inject 44 mcg into the skin 3 (three) times a week.      . nortriptyline  (PAMELOR) 25 MG capsule Take 50 mg by mouth at bedtime.      . potassium chloride SA (K-DUR,KLOR-CON) 20 MEQ tablet Take 1 tablet (20 mEq total) by mouth 2 (two) times daily.  60 tablet  1  . traMADol (ULTRAM) 50 MG tablet Take 1 tablet (50 mg total) by mouth every 6 (six) hours as needed (pain).  120 tablet  0   No facility-administered medications prior to visit.    PAST MEDICAL HISTORY: Past Medical History  Diagnosis Date  . Asthma   . Diabetes mellitus   . Hypertension   . PCOS (polycystic ovarian syndrome)   . Gastroparesis   . Polyneuropathy   . Multiple sclerosis     Dr. Jannifer Franklin  . Multiple sclerosis, primary progressive 2010  . Obesity   . Neurogenic bladder   . Neurogenic bowel   . Obesity   . Migraine headache     PAST SURGICAL HISTORY: Past Surgical History  Procedure Laterality Date  . Cholecystectomy    . Dilation and curettage of uterus    . Laparoscopic gastric banding  01/26/08  . Cesarean section    . Iud removal  03/19/2012    denies  . Colon surgery    . Abdominal hysterectomy      FAMILY HISTORY: Family History  Problem Relation Age of Onset  . Diabetes Mother   . Depression Mother   . Hypertension Mother   . Liver disease Father   . Depression Daughter     bipolar depression  . Coronary artery disease Other   . Cancer Other     ovarian  . Anesthesia problems Neg Hx     SOCIAL HISTORY: History   Social History  . Marital Status: Married    Spouse Name: [name]    Number of Children: 2  . Years of Education: BA   Occupational History  . 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 Smoking status: Never Smoker   . Smokeless tobacco: Never Used  . Alcohol Use: Yes  . Drug Use: No  . Sexual Activity: Not Currently    Birth Control/ Protection: Surgical   Other Topics Concern  . Not on file   Social History Narrative   Patient lives at home with husband [name].    Patient has 1 child and 1 step child.    Patient has a BA  degree.            PHYSICAL EXAM  Filed Vitals:   02/12/14 0839  BP: 138/94  Pulse: 96  Height: 5\' 4"  (1.626 m)  Weight: 244 lb (110.678 kg)   Body mass index is 41.86 kg/(m^2).  Generalized: Well developed, obese female in no acute distress  Head: normocephalic and atraumatic,. Oropharynx benign  Neck: Supple, no carotid bruits  Cardiac: Regular rate rhythm, no murmur  Musculoskeletal: No deformity   Neurological examination   Mentation: Alert oriented to time, place, history taking. Follows all commands speech and language fluent  Cranial nerve II-XII: Fundoscopic exam reveals flat disc margins.Visual acuity 20/40 bilaterally Pupils were equal round reactive to light extraocular movements  were full, visual field were full on confrontational test. Facial sensation and strength were normal. hearing was intact to finger rubbing bilaterally. Uvula tongue midline. head turning and shoulder shrug were normal and symmetric.Tongue protrusion into cheek strength was normal. Motor: normal bulk and tone, full strength in the BUE, BLE, fine finger movements normal, no pronator drift. No focal weakness Coordination: finger-nose-finger, heel-to-shin bilaterally, no dysmetria Reflexes: Symmetric upper and lower but depressed. Gait and Station: Rising up from seated position without assistance, normal stance,  moderate stride, good arm swing, smooth turning, able to perform tiptoe, and heel walking without difficulty. Tandem gait is steady  DIAGNOSTIC DATA (LABS, IMAGING, TESTING)      Component Value Date/Time   NA 137 10/14/2013 1315   NA 139 08/12/2013 0535   K 4.2 10/14/2013 1315   CL 100 10/14/2013 1315   CO2 28 10/14/2013 1315   GLUCOSE 89 10/14/2013 1315   GLUCOSE 110* 08/12/2013 0535   BUN 9 10/14/2013 1315   BUN 6 08/12/2013 0535   CREATININE 0.54* 10/14/2013 1315   CALCIUM 8.8 10/14/2013 1315   PROT 7.4 10/14/2013 1315   PROT 6.2 08/06/2013 0620   ALBUMIN 3.2* 08/06/2013 0620     AST 18 10/14/2013 1315   ALT 28 10/14/2013 1315   ALKPHOS 65 10/14/2013 1315   BILITOT 0.3 10/14/2013 1315   GFRNONAA 122 10/14/2013 1315   GFRAA 140 10/14/2013 1315    Lab Results  Component Value Date   HGBA1C 6.2* 08/02/2013   Lab Results  Component Value Date   [id number] 07/10/2013   Lab Results  Component Value Date   TSH 0.95 07/10/2013      ASSESSMENT AND PLAN  38 y.o. year old female  has a past medical history of Asthma; Diabetes mellitus; Hypertension;  Gastroparesis; Polyneuropathy;  Multiple sclerosis,  Obesity; Neurogenic bladder; Neurogenic bowel; Obesity; and Migraine headache. here to followup.  Will begin Topamax 25 mg hs for 1 week then increase by 25mg  weekly until dose is 100mg . Continue Rebif Will renew Ultram F/U in 6 months Will attempt referral to closer facility for her for neurologic care. She lives in [place] which is an hour from our office.  [facility] [place], [place]) Dennie Bible, [facility], [facility], [facility] [address] [place], [place] [address] [phone]

## 2014-02-12 NOTE — Progress Notes (Signed)
I have read the note, and I agree with the clinical assessment and plan.  [name],[name]   

## 2014-06-12 IMAGING — CR DG LUMBAR SPINE COMPLETE 4+V
5 series · 5 of 5 positions shown · non-contrast
Comparison: MRI lumbar spine 06/20/2009.

CLINICAL DATA: Burning sensation and throughout the spine.
Recurrent falls and incontinence for one month.

LUMBAR SPINE - COMPLETE 4+ VIEW

[t l-spine a.p.]
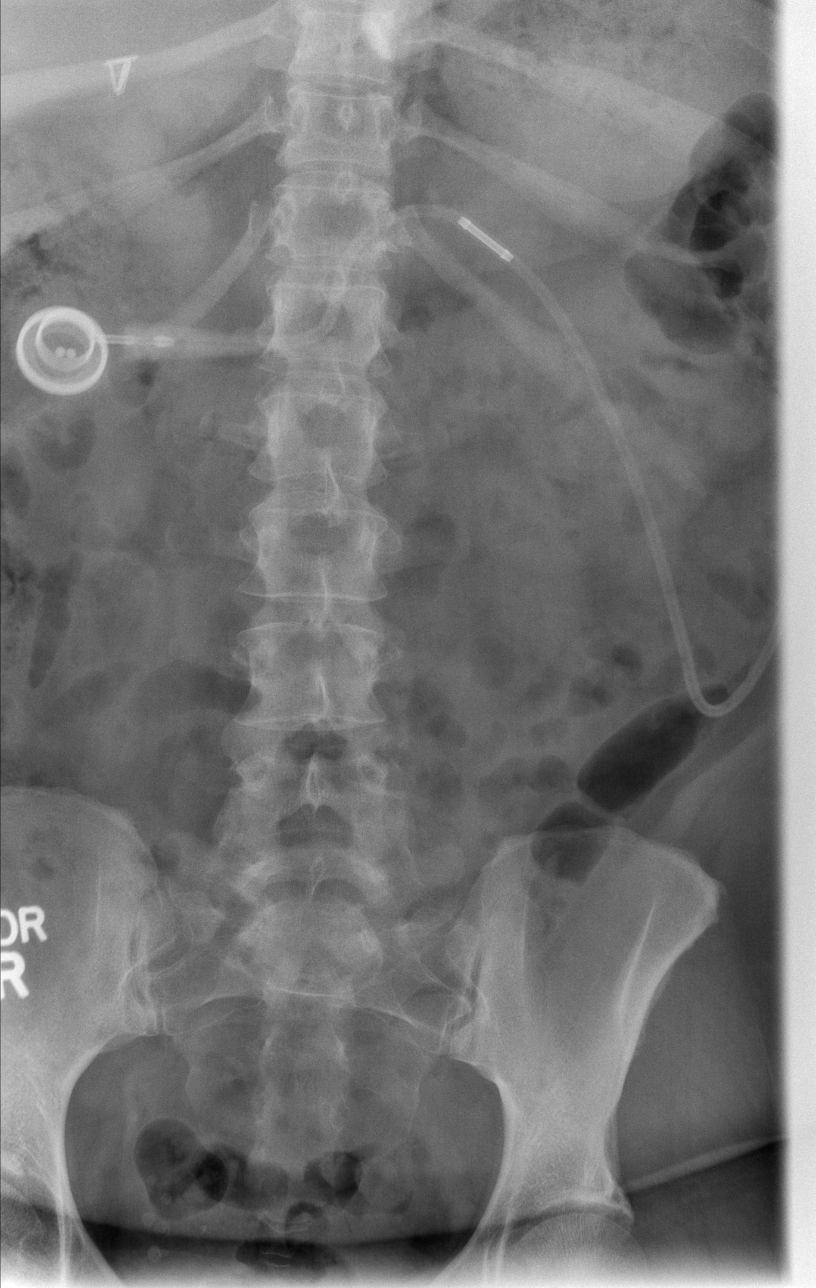

[t l-spine oblique exposure * (1 of 2)]
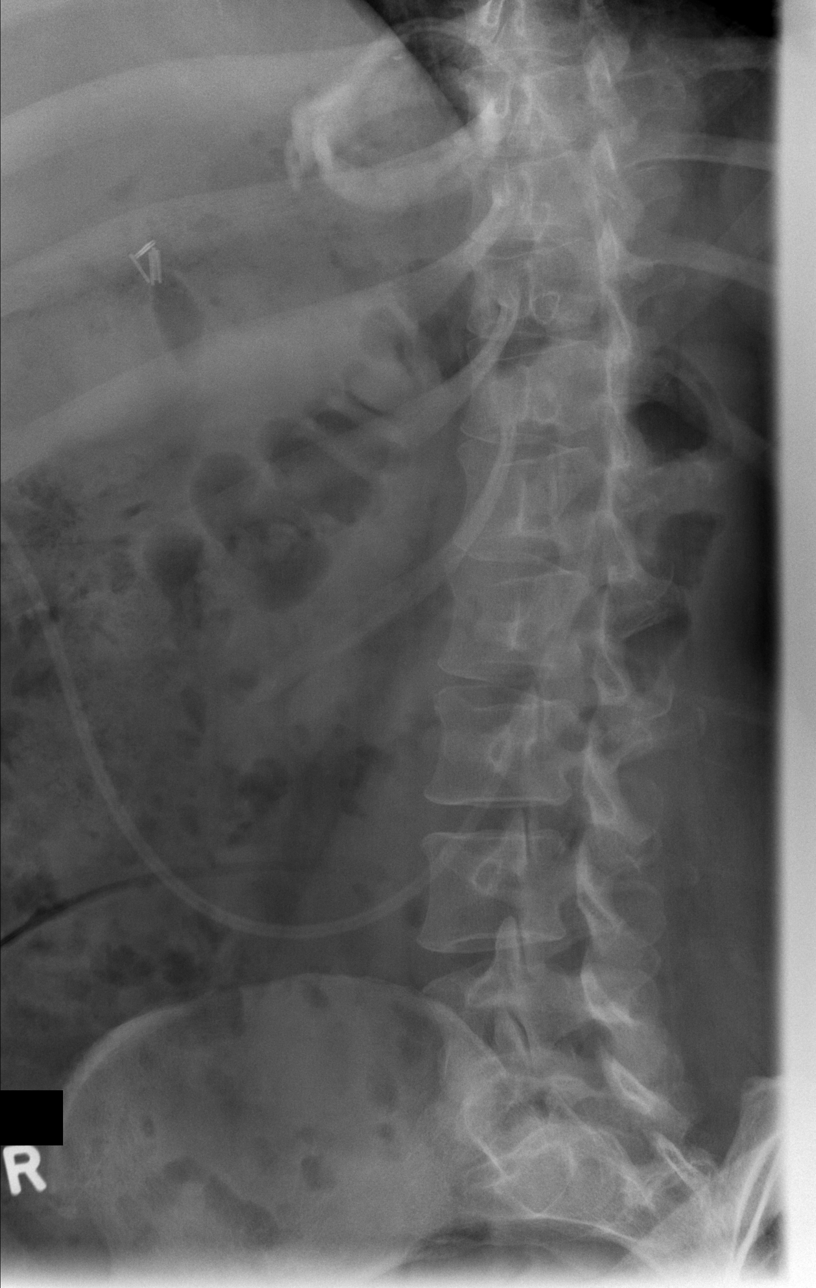

[t l-spine oblique exposure * (2 of 2)]
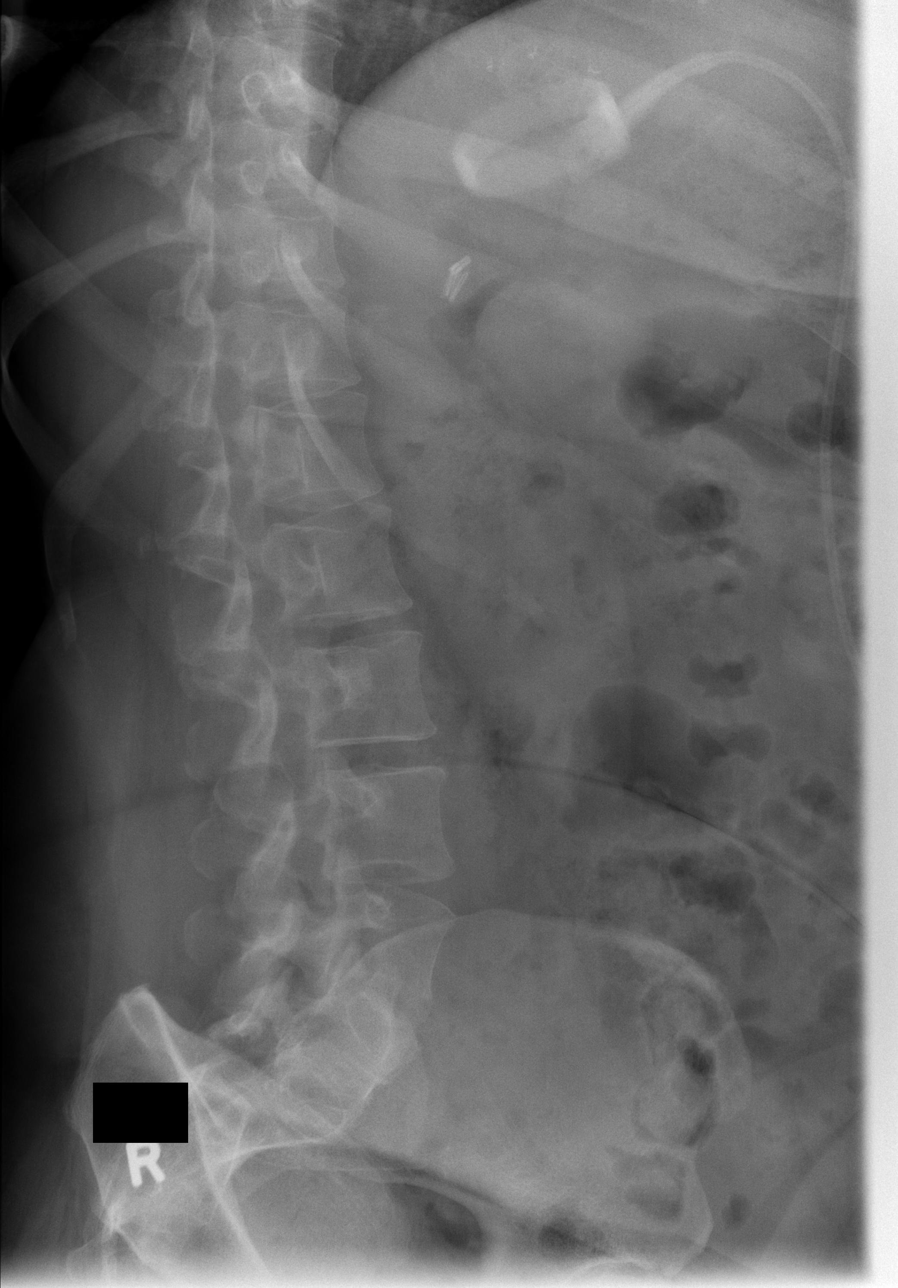

[t l-spine lat *]
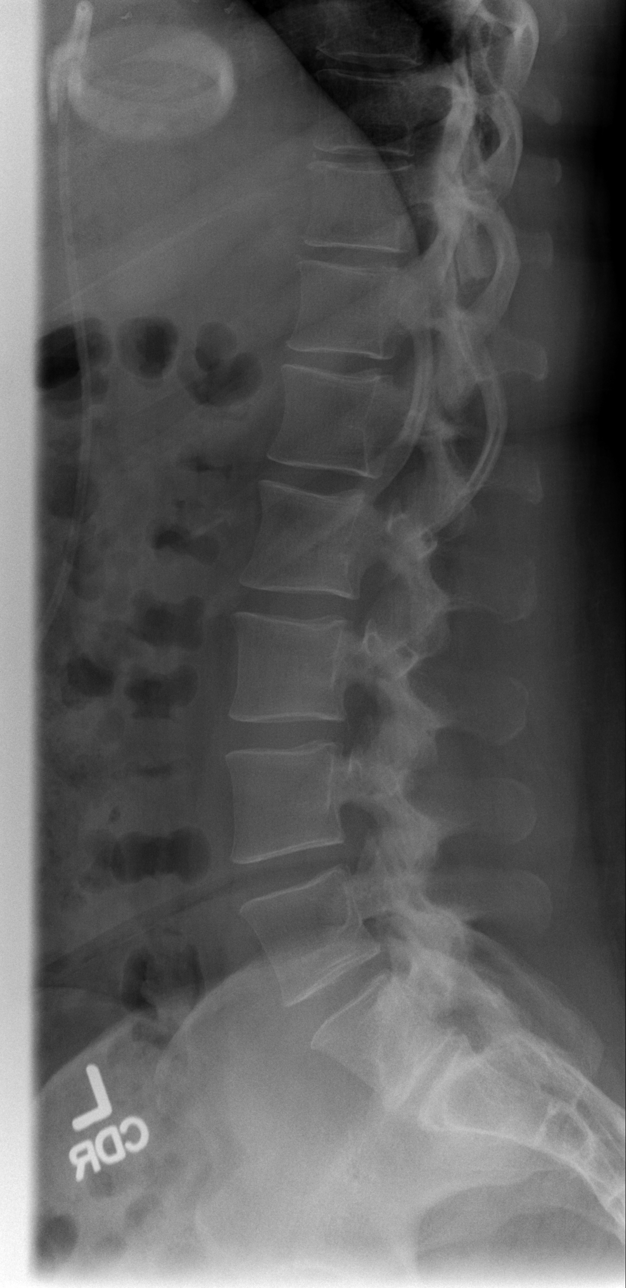

[t l-spine l5-s1 spot]
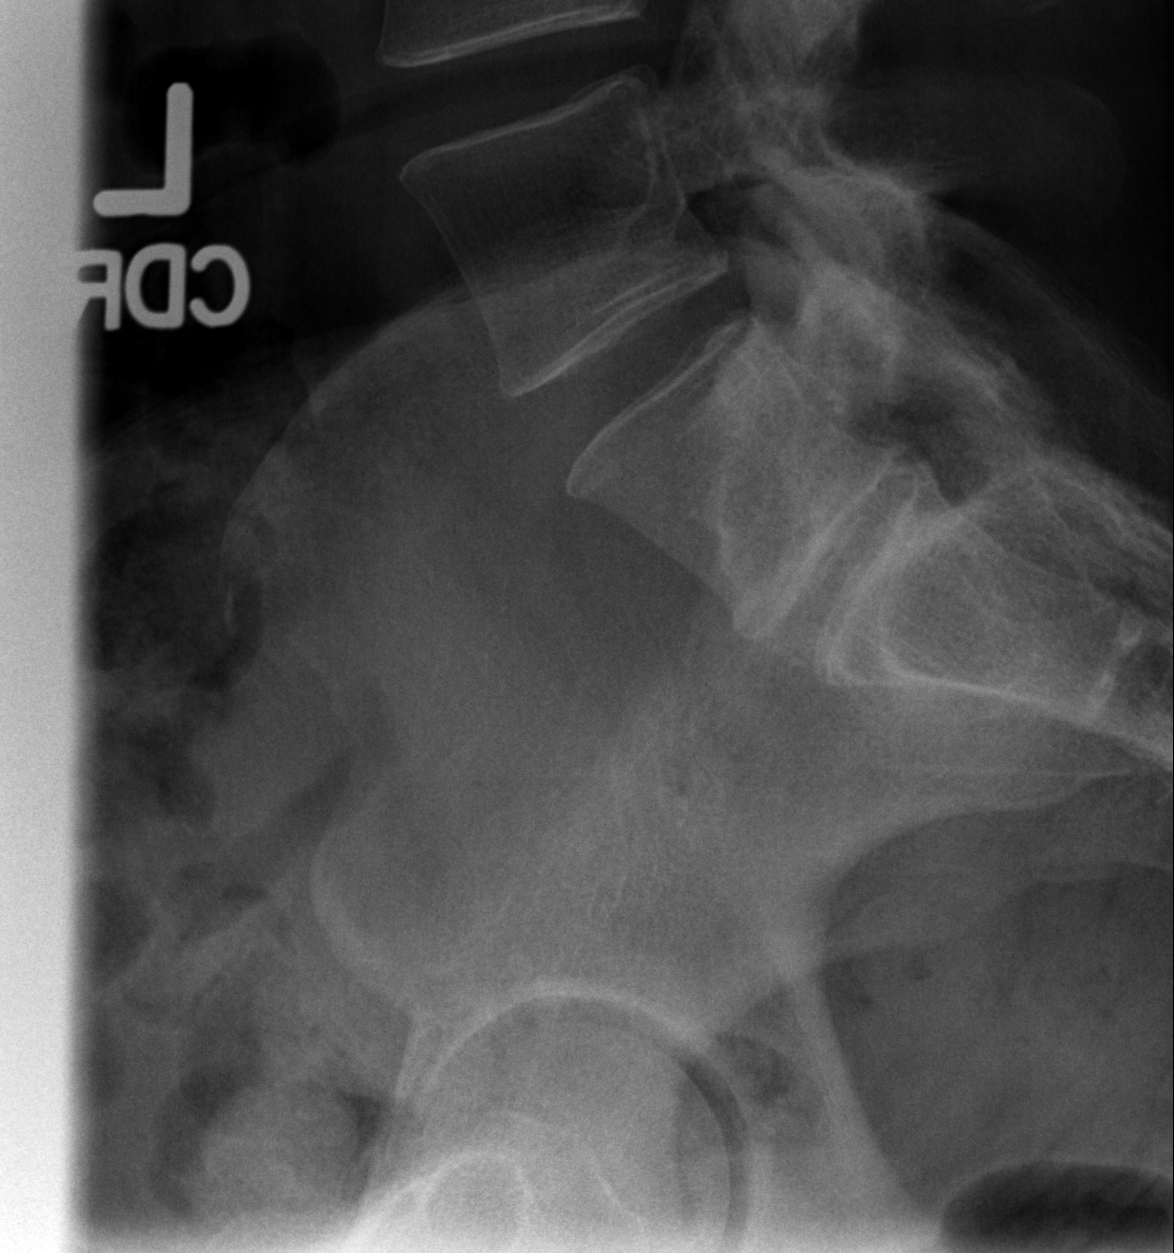

[5 of 5 positions shown; findings below may reference images not displayed]

FINDINGS: Vertebral body height and alignment are normal.  No pars
interarticularis defect is identified.  Intervertebral disc space
height is maintained.  Paraspinous structures demonstrate a lap
band which is partially visualized.  Cholecystectomy clips are also
noted.
IMPRESSION: Normal appearing lumbar spine.

## 2014-07-01 ENCOUNTER — Telehealth: Payer: Self-pay | Admitting: *Deleted

## 2014-07-01 DIAGNOSIS — E119 Type 2 diabetes mellitus without complications: Secondary | ICD-10-CM

## 2014-07-01 NOTE — Telephone Encounter (Signed)
Unable to reach patient by phone. Message sent to mychart Lipid, bmet, a1c, micro albumin ordered Diabetic bundle

## 2014-07-14 NOTE — Telephone Encounter (Signed)
Unable to leave message.  New message sent to my chart

## 2014-10-04 ENCOUNTER — Encounter: Payer: Self-pay | Admitting: Nurse Practitioner

## 2015-05-30 ENCOUNTER — Other Ambulatory Visit: Payer: Self-pay

## 2016-06-26 ENCOUNTER — Emergency Department (HOSPITAL_COMMUNITY)
Admission: EM | Admit: 2016-06-26 | Discharge: 2016-06-26 | Disposition: A | Discharge status: Home / Self Care | Payer: Medicare Other | Attending: Emergency Medicine | Admitting: Emergency Medicine

## 2016-06-26 ENCOUNTER — Encounter (HOSPITAL_COMMUNITY): Payer: Self-pay | Admitting: Emergency Medicine

## 2016-06-26 DIAGNOSIS — E119 Type 2 diabetes mellitus without complications: Secondary | ICD-10-CM | POA: Diagnosis not present

## 2016-06-26 DIAGNOSIS — I10 Essential (primary) hypertension: Secondary | ICD-10-CM | POA: Diagnosis not present

## 2016-06-26 DIAGNOSIS — E785 Hyperlipidemia, unspecified: Secondary | ICD-10-CM | POA: Insufficient documentation

## 2016-06-26 DIAGNOSIS — R51 Headache: Secondary | ICD-10-CM | POA: Diagnosis present

## 2016-06-26 DIAGNOSIS — Z79899 Other long term (current) drug therapy: Secondary | ICD-10-CM | POA: Insufficient documentation

## 2016-06-26 DIAGNOSIS — R519 Headache, unspecified: Secondary | ICD-10-CM

## 2016-06-26 HISTORY — DX: Unspecified convulsions: R56.9

## 2016-06-26 MED ORDER — KETOROLAC TROMETHAMINE 30 MG/ML IJ SOLN
30.0000 mg | Freq: Once | INTRAMUSCULAR | Status: AC
  Administered 2016-06-26: 30 mg via INTRAVENOUS
  Filled 2016-06-26: qty 1

## 2016-06-26 MED ORDER — METOCLOPRAMIDE HCL 5 MG/ML IJ SOLN
10.0000 mg | Freq: Once | INTRAMUSCULAR | Status: AC
  Administered 2016-06-26: 10 mg via INTRAVENOUS
  Filled 2016-06-26: qty 2

## 2016-06-26 MED ORDER — HYDROCODONE-ACETAMINOPHEN 5-325 MG PO TABS: 1.0000 | ORAL_TABLET | Freq: Four times a day (QID) | ORAL | 0 refills | Status: DC | PRN

## 2016-06-26 MED ORDER — DIPHENHYDRAMINE HCL 50 MG/ML IJ SOLN
25.0000 mg | Freq: Once | INTRAMUSCULAR | Status: AC
  Administered 2016-06-26: 25 mg via INTRAVENOUS
  Filled 2016-06-26: qty 1

## 2016-06-26 NOTE — Discharge Instructions (Signed)
Follow up with your md as needed °

## 2016-06-26 NOTE — ED Notes (Signed)
EDP at bedside  

## 2016-06-26 NOTE — ED Triage Notes (Signed)
Pt states she has had a migraine for the past 12hrs but that it has gotten worse in intensity and that she feels like it is exacerbating her MS, making her hearing "go in and out", and that she started seeing flashing lights.

## 2016-06-26 NOTE — ED Notes (Signed)
Pt states she is feeling better and is ready for d/c. Dr. Roderic Palau notified.

## 2016-06-26 NOTE — ED Provider Notes (Signed)
[facility] DEPT Provider Note   CSN: [id number] Arrival date & time: 06/26/16  [id number]  First Provider Contact:  First MD Initiated Contact with Patient 06/26/16 0730        History   Chief Complaint Chief Complaint  Patient presents with  . Migraine    HPI [name] is a 40 y.o. female.  Patient complains of a headache today mild nausea. She also has some photophobia   The history is provided by the patient. No language interpreter was used.  Migraine  This is a recurrent problem. The current episode started 12 to 24 hours ago. The problem occurs constantly. The problem has not changed since onset.Associated symptoms include headaches. Pertinent negatives include no chest pain and no abdominal pain. Nothing aggravates the symptoms. Nothing relieves the symptoms. She has tried acetaminophen for the symptoms.    Past Medical History:  Diagnosis Date  . Asthma   . Diabetes mellitus   . Gastroparesis   . Hypertension   . Migraine headache   . Multiple sclerosis (HCC)    Dr. Jannifer Franklin  . Multiple sclerosis, primary progressive ([place]) 2010  . Neurogenic bladder   . Neurogenic bowel   . Obesity   . Obesity   . PCOS (polycystic ovarian syndrome)   . Polyneuropathy ([place])   . Seizures [facility])     Patient Active Problem List   Diagnosis Date Noted  . Neuropathic pain of both legs 09/16/2013  . Exacerbation of multiple sclerosis ([place]) 08/01/2013  . Multiple sclerosis exacerbation ([place]) 06/30/2013  . Preoperative examination 02/01/2012  . MIGRAINE HEADACHE 12/15/2010  . SINUSITIS 01/19/2010  . BRONCHITIS 01/12/2010  . Multiple sclerosis ([place]) 06/16/2009  . ASTHMA, WITH ACUTE EXACERBATION 03/23/2009  . ALLERGIC RHINITIS 02/07/2009  . URINARY FREQUENCY 12/23/2008  . ASTHMATIC BRONCHITIS, ACUTE 12/17/2008  . PARESTHESIA 11/24/2008  . ACUT SUPPRATV OTITIS MEDIA W/O SPONT RUP EARDRUM 09/17/2008  . EPICONDYLITIS 09/02/2008  . SNORING 03/29/2008  . FATIGUE 03/02/2008  .  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 OBESITY, MORBID 02/23/2008  . ABDOMINAL PAIN, GENERALIZED 02/23/2008  . FATTY LIVER DISEASE 01/27/2008  . HYPERLIPIDEMIA 01/19/2008  . LIVER FUNCTION TESTS, ABNORMAL 01/19/2008  . DIABETES MELLITUS, TYPE II 06/27/2007  . HYPERTENSION 06/27/2007  . ASTHMA 06/27/2007  . GERD 06/27/2007    Past Surgical History:  Procedure Laterality Date  . ABDOMINAL HYSTERECTOMY    . CESAREAN SECTION    . CHOLECYSTECTOMY    . COLON SURGERY    . DILATION AND CURETTAGE OF UTERUS    . IUD REMOVAL  03/19/2012   denies  . LAPAROSCOPIC GASTRIC BANDING  01/26/08    OB History    Gravida Para Term Preterm AB Living   1 1 1     1    SAB TAB Ectopic Multiple Live Births   0   0 0         Home Medications    Prior to Admission medications   Medication Sig Start Date End Date Taking? Authorizing Provider  acetaminophen (TYLENOL) 500 MG tablet Take 1,000 mg by mouth every 6 (six) hours as needed for mild pain or headache.   Yes Historical Provider, MD  baclofen (LIORESAL) 10 MG tablet Take 0.5 tablets (5 mg total) by mouth 3 (three) times daily. For spasticity Patient taking differently: Take 10 mg by mouth 3 (three) times daily as needed for muscle spasms.  11/20/13  Yes Meredith Staggers, MD  Cholecalciferol (VITAMIN D3) 5000 units CAPS Take 1 capsule by mouth daily.  Yes Historical Provider, MD  Cyanocobalamin (VITAMIN B-12) 5000 MCG LOZG Take 1 tablet by mouth daily.   Yes Historical Provider, MD  FLUoxetine (PROZAC) 40 MG capsule Take 40 mg by mouth daily.   Yes Historical Provider, MD  gabapentin (NEURONTIN) 300 MG capsule Take 2 capsules (600 mg total) by mouth every 8 (eight) hours. For nerve pain Patient taking differently: Take 1,200 mg by mouth 4 (four) times daily. For nerve pain 08/18/13  Yes Ivan Anchors Love, PA-C  levETIRAcetam (KEPPRA) 500 MG tablet Take 500 mg by mouth 2 (two) times daily.   Yes Historical Provider, MD  lisinopril (PRINIVIL,ZESTRIL) 10 MG tablet Take  10 mg by mouth daily.   Yes Historical Provider, MD  metoprolol tartrate (LOPRESSOR) 25 MG tablet Take 25 mg by mouth 2 (two) times daily.   Yes Historical Provider, MD  Multiple Vitamins-Minerals (MULTIVITAMINS THER. W/MINERALS) TABS tablet Take 1 tablet by mouth daily.   Yes Historical Provider, MD  ondansetron (ZOFRAN-ODT) 4 MG disintegrating tablet Take 4 mg by mouth every 8 (eight) hours as needed for nausea or vomiting.   Yes Historical Provider, MD  oxybutynin (DITROPAN) 5 MG tablet Take 5 mg by mouth 3 (three) times daily.   Yes Historical Provider, MD  triamcinolone cream (KENALOG) 0.5 % Apply 1 application topically daily as needed.   Yes Historical Provider, MD  HYDROcodone-acetaminophen (NORCO/VICODIN) 5-325 MG tablet Take 1 tablet by mouth every 6 (six) hours as needed for moderate pain. 06/26/16   Milton Ferguson, MD    Family History Family History  Problem Relation Age of Onset  . Diabetes Mother   . Depression Mother   . Hypertension Mother   . Liver disease Father   . Depression Daughter     bipolar depression  . Coronary artery disease Other   . Cancer Other     ovarian  . Anesthesia problems Neg Hx     Social History Social History  Substance Use Topics  . Smoking status: Never Smoker  . Smokeless tobacco: Never Used  . Alcohol use Yes     Allergies   Metformin and related   Review of Systems Review of Systems  Constitutional: Negative for appetite change and fatigue.  HENT: Negative for congestion, ear discharge and sinus pressure.   Eyes: Negative for discharge.  Respiratory: Negative for cough.   Cardiovascular: Negative for chest pain.  Gastrointestinal: Negative for abdominal pain and diarrhea.  Genitourinary: Negative for frequency and hematuria.  Musculoskeletal: Negative for back pain.  Skin: Negative for rash.  Neurological: Positive for headaches. Negative for seizures.  Psychiatric/Behavioral: Negative for hallucinations.     Physical  Exam Updated Vital Signs BP 127/98   Pulse (!) 56   Temp 98.1 F (36.7 C) (Oral)   Resp 14   Ht 5\' 3"  (1.6 m)   Wt 150 lb (68 kg)   LMP 03/17/2012   SpO2 99%   BMI 26.57 kg/m   Physical Exam  Constitutional: She is oriented to person, place, and time. She appears well-developed.  HENT:  Head: Normocephalic.  Eyes: Conjunctivae and EOM are normal. No scleral icterus.  Neck: Neck supple. No thyromegaly present.  Cardiovascular: Normal rate and regular rhythm.  Exam reveals no gallop and no friction rub.   No murmur heard. Pulmonary/Chest: No stridor. She has no wheezes. She has no rales. She exhibits no tenderness.  Abdominal: She exhibits no distension. There is no tenderness. There is no rebound.  Musculoskeletal: Normal range of motion. She  exhibits no edema.  Lymphadenopathy:    She has no cervical adenopathy.  Neurological: She is oriented to person, place, and time. She exhibits normal muscle tone. Coordination normal.  Skin: No rash noted. No erythema.  Psychiatric: She has a normal mood and affect. Her behavior is normal.     ED Treatments / Results  Labs (all labs ordered are listed, but only abnormal results are displayed) Labs Reviewed - No data to display  EKG  EKG Interpretation None       Radiology No results found.  Procedures Procedures (including critical care time)  Medications Ordered in ED Medications  ketorolac (TORADOL) 30 MG/ML injection 30 mg (30 mg Intravenous Given 06/26/16 0757)  metoCLOPramide (REGLAN) injection 10 mg (10 mg Intravenous Given 06/26/16 0757)  diphenhydrAMINE (BENADRYL) injection 25 mg (25 mg Intravenous Given 06/26/16 0757)     Initial Impression / Assessment and Plan / ED Course  I have reviewed the triage vital signs and the nursing notes.  Pertinent labs & imaging results that were available during my care of the patient were reviewed by me and considered in my medical decision making (see chart for  details).  Clinical Course    Patient's headache improved with treatment Final Clinical Impressions(s) / ED Diagnoses   Final diagnoses:  Headache above the eye region   Patient with migraine headache she will follow-up with her family doctor as needed she is given a few Vicodin in case the headache returns New Prescriptions New Prescriptions   HYDROCODONE-ACETAMINOPHEN (NORCO/VICODIN) 5-325 MG TABLET    Take 1 tablet by mouth every 6 (six) hours as needed for moderate pain.     Milton Ferguson, MD 06/26/16 1121

## 2016-07-23 LAB — HM DIABETES EYE EXAM

## 2016-09-05 ENCOUNTER — Other Ambulatory Visit: Payer: Self-pay | Admitting: *Deleted

## 2016-09-05 ENCOUNTER — Other Ambulatory Visit: Payer: Self-pay | Admitting: Physician Assistant

## 2016-09-05 MED ORDER — FLUOXETINE HCL 20 MG PO TABS: ORAL_TABLET | ORAL | 0 refills | Status: DC

## 2016-09-07 ENCOUNTER — Other Ambulatory Visit: Payer: Self-pay | Admitting: *Deleted

## 2016-09-07 MED ORDER — FLUOXETINE HCL 20 MG PO TABS: ORAL_TABLET | ORAL | 0 refills | Status: DC

## 2016-10-31 ENCOUNTER — Ambulatory Visit (INDEPENDENT_AMBULATORY_CARE_PROVIDER_SITE_OTHER): Payer: Medicare Other | Admitting: Physician Assistant

## 2016-10-31 ENCOUNTER — Encounter: Payer: Self-pay | Admitting: Physician Assistant

## 2016-10-31 VITALS — BP 109/76 | HR 85 | Temp 98.5°F | Ht 63.0 in | Wt 181.6 lb

## 2016-10-31 DIAGNOSIS — Z79899 Other long term (current) drug therapy: Secondary | ICD-10-CM | POA: Diagnosis not present

## 2016-10-31 DIAGNOSIS — G5793 Unspecified mononeuropathy of bilateral lower limbs: Secondary | ICD-10-CM

## 2016-10-31 DIAGNOSIS — R635 Abnormal weight gain: Secondary | ICD-10-CM

## 2016-10-31 DIAGNOSIS — G5792 Unspecified mononeuropathy of left lower limb: Secondary | ICD-10-CM | POA: Diagnosis not present

## 2016-10-31 DIAGNOSIS — G5791 Unspecified mononeuropathy of right lower limb: Secondary | ICD-10-CM

## 2016-10-31 DIAGNOSIS — G35 Multiple sclerosis: Secondary | ICD-10-CM

## 2016-10-31 DIAGNOSIS — I10 Essential (primary) hypertension: Secondary | ICD-10-CM

## 2016-10-31 DIAGNOSIS — Z0000 Encounter for general adult medical examination without abnormal findings: Secondary | ICD-10-CM

## 2016-10-31 NOTE — Patient Instructions (Signed)
Wrist and Forearm Exercises Ask your health care provider which exercises are safe for you. Do exercises exactly as told by your health care provider and adjust them as directed. It is normal to feel mild stretching, pulling, tightness, or discomfort as you do these exercises, but you should stop right away if you feel sudden pain or your pain gets worse. Do not begin these exercises until told by your health care provider. RANGE OF MOTION EXERCISES  These exercises warm up your muscles and joints and improve the movement and flexibility of your injured wrist and forearm. These exercises also help to relieve pain, numbness, and tingling. These exercises are done using the muscles in your injured wrist and forearm. Exercise A: Wrist Flexion, Active 1. With your fingers relaxed, bend your wrist forward as far as you can. 2. Hold this position for __________ seconds. Repeat __________ times. Complete this exercise __________ times a day. Exercise B: Wrist Extension, Active 1. With your fingers relaxed, bend your wrist backward as far as you can. 2. Hold this position for __________ seconds. Repeat __________ times. Complete this exercise __________ times a day. Exercise C: Supination, Active  1. Stand or sit with your arms at your sides. 2. Bend your left / right elbow to an "L" shape (90 degrees). 3. Turn your palm upward until you feel a gentle stretch on the inside of your forearm. 4. Hold this position for __________ seconds. 5. Slowly return your palm to the starting position. Repeat __________ times. Complete this exercise __________ times a day. Exercise D: Pronation, Active  1. Stand or sit with your arms at your sides. 2. Bend your left / right elbow to an "L" shape (90 degrees). 3. Turn your palm downward until you feel a gentle stretch on the top of your forearm. 4. Hold this position for __________ seconds. 5. Slowly return your palm to the starting position. Repeat __________  times. Complete this exercise __________ times a day. STRETCHING EXERCISES  These exercises warm up your muscles and joints and improve the movement and flexibility of your injured wrist and forearm. These exercises also help to relieve pain, numbness, and tingling. These exercises are done using your healthy wrist and forearm to help stretch the muscles in your injured wrist and forearm. Exercise E: Wrist Flexion, Passive  1. Extend your left / right arm in front of you, relax your wrist, and point your fingers downward. 2. Gently push on the back of your hand. Stop when you feel a gentle stretch on the top of your forearm. 3. Hold this position for __________ seconds. Repeat __________ times. Complete this exercise __________ times a day. Exercise F: Wrist Extension, Passive  1. Extend your left / right arm in front of you and turn your palm upward. 2. Gently pull your palm and fingertips back so your fingers point downward. You should feel a gentle stretch on the palm-side of your forearm. 3. Hold this position for __________ seconds. Repeat __________ times. Complete this exercise __________ times a day. Exercise G: Forearm Rotation, Supination, Passive 1. Sit with your left / right elbow bent to an "L" shape (90 degrees) with your forearm resting on a table. 2. Keeping your upper body and shoulder still, use your other hand to rotate your forearm palm-up until you feel a gentle to moderate stretch. 3. Hold this position for __________ seconds. 4. Slowly release the stretch and return to the starting position. Repeat __________ times. Complete this exercise __________ times a day.  Exercise H: Forearm Rotation, Pronation, Passive 1. Sit with your left / right elbow bent to an "L" shape (90 degrees) with your forearm resting on a table. 2. Keeping your upper body and shoulder still, use your other hand to rotate your forearm palm-down until you feel a gentle to moderate stretch. 3. Hold this  position for __________ seconds. 4. Slowly release the stretch and return to the starting position. Repeat __________ times. Complete this exercise __________ times a day. STRENGTHENING EXERCISES  These exercises build strength and endurance in your wrist and forearm. Endurance is the ability to use your muscles for a long time, even after they get tired. Exercise I: Wrist Flexors  1. Sit with your left / right forearm supported on a table and your hand resting palm-up over the edge of the table. Your elbow should be bent to an "L" shape (about 90 degrees) and be below the level of your shoulder. 2. Hold a __________ weight in your left / right hand. Or, hold a rubber exercise band or tube in both hands, keeping your hands at the same level and hip distance apart. There should be a slight tension in the exercise band or tube. 3. Slowly curl your hand up toward your forearm. 4. Hold this position for __________ seconds. 5. Slowly lower your hand back to the starting position. Repeat __________ times. Complete this exercise __________ times a day. Exercise J: Wrist Extensors  1. Sit with your left / right forearm supported on a table and your hand resting palm-down over the edge of the table. Your elbow should be bent to an "L" shape (about 90 degrees) and be below the level of your shoulder. 2. Hold a __________ weight in your left / right hand. Or, hold a rubber exercise band or tube in both hands, keeping your hands at the same level and hip distance apart. There should be a slight tension in the exercise band or tube. 3. Slowly curl your hand up toward your forearm. 4. Hold this position for __________ seconds. 5. Slowly lower your hand back to the starting position. Repeat __________ times. Complete this exercise __________ times a day. Exercise K: Forearm Rotation, Supination  1. Sit with your left / right forearm supported on a table and your hand resting palm-down. Your elbow should be at  your side, bent to an "L" shape (about 90 degrees), and below the level of your shoulder. Keep your wrist stable and in a neutral position throughout the exercise. 2. Gently hold a lightweight hammer with your left / right hand. 3. Without moving your elbow or wrist, slowly rotate your palm upward to a thumbs-up position. 4. Hold this position for __________ seconds. 5. Slowly return your forearm to the starting position. Repeat __________ times. Complete this exercise __________ times a day. Exercise L: Forearm Rotation, Pronation  1. Sit with your left / right forearm supported on a table and your hand resting palm-up. Your elbow should be at your side, bent to an "L" shape (about 90 degrees), and below the level of your shoulder. Keep your wrist stable. Do not allow it to move backward or forward during the exercise. 2. Gently hold a lightweight hammer with your left / right hand. 3. Without moving your elbow or wrist, slowly rotate your palm and hand upward to a thumbs-up position. 4. Hold this position for __________ seconds. 5. Slowly return your forearm to the starting position. Repeat __________ times. Complete this exercise __________ times a day.  Exercise M: Grip Strengthening  1. Hold one of these items in your left / right hand: play dough, therapy putty, a dense sponge, a stress ball, or a large, rolled sock. 2. Squeeze as hard as you can without increasing pain. 3. Hold this position for __________ seconds. 4. Slowly release your grip. Repeat __________ times. Complete this exercise __________ times a day. This information is not intended to replace advice given to you by your health care provider. Make sure you discuss any questions you have with your health care provider. Document Released: 10/03/2005 Document Revised: 08/13/2016 Document Reviewed: 08/14/2015 Elsevier Interactive Patient Education  2017 [employer].

## 2016-11-01 ENCOUNTER — Telehealth: Payer: Self-pay | Admitting: Physician Assistant

## 2016-11-01 LAB — LIPID PANEL
Chol/HDL Ratio: 3.4 ratio units (ref 0.0–4.4)
Cholesterol, Total: 278 mg/dL — ABNORMAL HIGH (ref 100–199)
HDL: 81 mg/dL (ref >39)
LDL Calculated: 178 mg/dL — ABNORMAL HIGH (ref 0–99)
Triglycerides: 93 mg/dL (ref 0–149)
VLDL Cholesterol Cal: 19 mg/dL (ref 5–40)

## 2016-11-01 LAB — CMP14+EGFR
ALT: 14 IU/L (ref 0–32)
AST: 15 IU/L (ref 0–40)
Albumin/Globulin Ratio: 1.5 (ref 1.2–2.2)
Albumin: 4.3 g/dL (ref 3.5–5.5)
Alkaline Phosphatase: 60 IU/L (ref 39–117)
BUN/Creatinine Ratio: 15 (ref 9–23)
BUN: 9 mg/dL (ref 6–20)
Bilirubin Total: 0.5 mg/dL (ref 0.0–1.2)
CO2: 27 mmol/L (ref 18–29)
Calcium: 9.3 mg/dL (ref 8.7–10.2)
Chloride: 102 mmol/L (ref 96–106)
Creatinine, Ser: 0.62 mg/dL (ref 0.57–1.00)
GFR calc Af Amer: 131 mL/min/{1.73_m2} (ref >59)
GFR calc non Af Amer: 114 mL/min/{1.73_m2} (ref >59)
Globulin, Total: 2.8 g/dL (ref 1.5–4.5)
Glucose: 83 mg/dL (ref 65–99)
Potassium: 4.7 mmol/L (ref 3.5–5.2)
Sodium: 141 mmol/L (ref 134–144)
Total Protein: 7.1 g/dL (ref 6.0–8.5)

## 2016-11-01 LAB — CBC WITH DIFFERENTIAL/PLATELET
Basophils Absolute: 0 10*3/uL (ref 0.0–0.2)
Basos: 0 %
EOS (ABSOLUTE): 0.1 10*3/uL (ref 0.0–0.4)
Eos: 1 %
Hematocrit: 40.6 % (ref 34.0–46.6)
Hemoglobin: 13.5 g/dL (ref 11.1–15.9)
Immature Grans (Abs): 0 10*3/uL (ref 0.0–0.1)
Immature Granulocytes: 0 %
Lymphocytes Absolute: 1.6 10*3/uL (ref 0.7–3.1)
Lymphs: 28 %
MCH: 31.1 pg (ref 26.6–33.0)
MCHC: 33.3 g/dL (ref 31.5–35.7)
MCV: 94 fL (ref 79–97)
Monocytes Absolute: 0.5 10*3/uL (ref 0.1–0.9)
Monocytes: 9 %
Neutrophils Absolute: 3.4 10*3/uL (ref 1.4–7.0)
Neutrophils: 62 %
Platelets: 349 10*3/uL (ref 150–379)
RBC: 4.34 x10E6/uL (ref 3.77–5.28)
RDW: 13.1 % (ref 12.3–15.4)
WBC: 5.5 10*3/uL (ref 3.4–10.8)

## 2016-11-01 LAB — TSH: TSH: 0.444 u[IU]/mL — ABNORMAL LOW (ref 0.450–4.500)

## 2016-11-01 NOTE — Telephone Encounter (Signed)
Pt reported

## 2016-11-02 ENCOUNTER — Telehealth: Payer: Self-pay | Admitting: Physician Assistant

## 2016-11-02 MED ORDER — SIMVASTATIN 10 MG PO TABS: 10.0000 mg | ORAL_TABLET | Freq: Every day | ORAL | 5 refills | Status: DC

## 2016-11-02 NOTE — Telephone Encounter (Signed)
Medication sent.

## 2016-11-04 NOTE — Progress Notes (Signed)
BP 109/76   Pulse 85   Temp 98.5 F (36.9 C) (Oral)   Ht 5' 3"  (1.6 m)   Wt 181 lb 9.6 oz (82.4 kg)   LMP 03/17/2012   BMI 32.17 kg/m    Subjective:    Patient ID: [name], female    DOB: 1976-11-17, 40 y.o.   MRN: [id number]  [name] is a 40 y.o. female presenting on 10/31/2016 for Follow-up (Discuss symptoms from [place]. Follow up on recent bronchitis and wrist sprain )  HPI Patient here to be established as new patient at [facility].  This patient is known to me from [facility]. This patient comes in for periodic recheck on medications and conditions. All medications are reviewed today. There are no reports of any problems with the medications. All of the medical conditions are reviewed and updated.  Lab work is reviewed and will be ordered as medically necessary.   Patient has multiple medical problems including multiple sclerosis. She sees Dr. Dellis Filbert and the [place] area. She'll be going for another Rituxan type treatment within a month. She did very well for the past 2 treatments. Lasted up until about 5 months. And both times at that point her symptoms came back with MS. She has has extreme fatigue having the feeling. She has had increased tremors and shaking. Her memory has gotten much worse. She is also having burning feeling in her arms and legs. She reports her blood work done we will perform that today. All of her medical history is reviewed today and brought up-to-date in our chart. Past Medical History:  Diagnosis Date  . Asthma   . Diabetes mellitus   . Gastroparesis   . Hypertension   . Migraine headache   . Multiple sclerosis (HCC)    Dr. Jannifer Franklin  . 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 Neurogenic bladder   . Neurogenic bowel   . Obesity   . Obesity   . PCOS (polycystic ovarian syndrome)   . Polyneuropathy ([place])   . Seizures ([place])    Relevant past medical, surgical, family and social history reviewed  and updated as indicated. Interim medical history since our last visit reviewed. Allergies and medications reviewed and updated.   Data reviewed from any sources in EPIC.  Review of Systems  Constitutional: Positive for fatigue. Negative for activity change and fever.  HENT: Negative.   Eyes: Negative.   Respiratory: Negative.  Negative for cough.   Cardiovascular: Negative.  Negative for chest pain.  Gastrointestinal: Negative.  Negative for abdominal pain.  Endocrine: Negative.   Genitourinary: Positive for difficulty urinating and frequency. Negative for decreased urine volume and dysuria.  Musculoskeletal: Positive for back pain and gait problem.  Skin: Negative.   Neurological: Positive for dizziness, tremors, weakness and numbness.  Hematological: Negative.   Psychiatric/Behavioral: Positive for decreased concentration. The patient is nervous/anxious.      Social History   Social History  . Marital status: Married    Spouse name: Corene Cornea  . Number of children: 2  . Years of education: BA   Occupational History  . 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 Smoking status: Never Smoker  . Smokeless tobacco: Never Used  . Alcohol use Yes  . Drug use: No  . Sexual activity: Not Currently    Birth control/ protection: Surgical   Other Topics Concern  . Not on file   Social History Narrative   Patient lives at  home with husband Corene Cornea.    Patient has 1 child and 1 step child.    Patient has a BA degree.           Past Surgical History:  Procedure Laterality Date  . ABDOMINAL HYSTERECTOMY    . CESAREAN SECTION    . CHOLECYSTECTOMY    . COLON SURGERY    . DILATION AND CURETTAGE OF UTERUS    . IUD REMOVAL  03/19/2012   denies  . LAPAROSCOPIC GASTRIC BANDING  01/26/08    Family History  Problem Relation Age of Onset  . Diabetes Mother   . Depression Mother   . Hypertension Mother   . Liver disease Father   . Depression Daughter     bipolar depression    . Coronary artery disease Other   . Cancer Other     ovarian  . Anesthesia problems Neg Hx       Medication List       Accurate as of 10/31/16 11:59 PM. Always use your most recent med list.          acetaminophen 500 MG tablet Commonly known as:  TYLENOL Take 1,000 mg by mouth every 6 (six) hours as needed for mild pain or headache.   albuterol (2.5 MG/3ML) 0.083% nebulizer solution Commonly known as:  PROVENTIL INHALE 1 VIAL VIA NEB EVERY 4-6 HOURS AS NEEDED FOR SHORTNESS OF BREATH   amphetamine-dextroamphetamine 20 MG tablet Commonly known as:  ADDERALL Take 20 mg by mouth 2 (two) times daily.   aspirin EC 81 MG tablet Take 81 mg by mouth daily.   baclofen 10 MG tablet Commonly known as:  LIORESAL Take 10 mg by mouth 3 (three) times daily. Muscle spasm   diclofenac sodium 1 % Gel Commonly known as:  VOLTAREN Apply topically 4 (four) times daily.   FLUoxetine 20 MG tablet Commonly known as:  PROZAC TAKE 3 TABLETS EVERY DAY   gabapentin 600 MG tablet Commonly known as:  NEURONTIN Take 1,200 mg by mouth 4 (four) times daily.   HYDROcodone-acetaminophen 10-325 MG tablet Commonly known as:  NORCO Take 1 tablet by mouth 2 (two) times daily.   levETIRAcetam 500 MG tablet Commonly known as:  KEPPRA Take 500 mg by mouth 2 (two) times daily.   lisinopril 10 MG tablet Commonly known as:  PRINIVIL,ZESTRIL Take 10 mg by mouth daily.   metoprolol tartrate 25 MG tablet Commonly known as:  LOPRESSOR Take 25 mg by mouth 2 (two) times daily.   multivitamins ther. w/minerals Tabs tablet Take 1 tablet by mouth daily.   oxybutynin 5 MG tablet Commonly known as:  DITROPAN Take 5 mg by mouth 3 (three) times daily.   PROBIOTIC-10 PO Take by mouth.   promethazine 12.5 MG tablet Commonly known as:  PHENERGAN Take 12.5 mg by mouth every 4 (four) hours as needed for nausea or vomiting.   triamcinolone cream 0.5 % Commonly known as:  KENALOG Apply 1 application  topically daily as needed.   Vitamin B-12 5000 MCG Lozg Take 1 tablet by mouth daily.   Vitamin D3 5000 units Caps Take 1 capsule by mouth daily.          Objective:    BP 109/76   Pulse 85   Temp 98.5 F (36.9 C) (Oral)   Ht 5' 3"  (1.6 m)   Wt 181 lb 9.6 oz (82.4 kg)   LMP 03/17/2012   BMI 32.17 kg/m   Allergies  Allergen Reactions  .  Metformin And Related     Diarrhea with higher doses   Wt Readings from Last 3 Encounters:  10/31/16 181 lb 9.6 oz (82.4 kg)  06/26/16 150 lb (68 kg)  02/12/14 244 lb (110.7 kg)    Physical Exam  Constitutional: She is oriented to person, place, and time. She appears well-developed and well-nourished.  HENT:  Head: Normocephalic and atraumatic.  Eyes: Conjunctivae and EOM are normal. Pupils are equal, round, and reactive to light.  Cardiovascular: Normal rate, regular rhythm, normal heart sounds and intact distal pulses.   Pulmonary/Chest: [facility] normal and breath sounds normal.  Abdominal: Soft. Bowel sounds are normal.  Neurological: She is alert and oriented to person, place, and time. She has normal strength and normal reflexes. No cranial nerve deficit. She displays a negative Romberg sign.  Skin: Skin is warm and dry. No rash noted.  Psychiatric: She has a normal mood and affect. Her behavior is normal. Judgment and thought content normal.    Results for orders placed or performed in visit on 10/31/16  CBC with Differential/Platelet  Result Value Ref Range   WBC 5.5 3.4 - 10.8 x10E3/uL   RBC 4.34 3.77 - 5.28 x10E6/uL   Hemoglobin 13.5 11.1 - 15.9 g/dL   Hematocrit 40.6 34.0 - 46.6 %   MCV 94 79 - 97 fL   MCH 31.1 26.6 - 33.0 pg   MCHC 33.3 31.5 - 35.7 g/dL   RDW 13.1 12.3 - 15.4 %   Platelets 349 150 - 379 x10E3/uL   Neutrophils 62 Not Estab. %   Lymphs 28 Not Estab. %   Monocytes 9 Not Estab. %   Eos 1 Not Estab. %   Basos 0 Not Estab. %   Neutrophils Absolute 3.4 1.4 - 7.0 x10E3/uL   Lymphocytes Absolute 1.6 0.7 -  3.1 x10E3/uL   Monocytes Absolute 0.5 0.1 - 0.9 x10E3/uL   EOS (ABSOLUTE) 0.1 0.0 - 0.4 x10E3/uL   Basophils Absolute 0.0 0.0 - 0.2 x10E3/uL   Immature Granulocytes 0 Not Estab. %   Immature Grans (Abs) 0.0 0.0 - 0.1 x10E3/uL  CMP14+EGFR  Result Value Ref Range   Glucose 83 65 - 99 mg/dL   BUN 9 6 - 20 mg/dL   Creatinine, Ser 0.62 0.57 - 1.00 mg/dL   GFR calc non Af Amer 114 >59 mL/min/1.73   GFR calc Af Amer 131 >59 mL/min/1.73   BUN/Creatinine Ratio 15 9 - 23   Sodium 141 134 - 144 mmol/L   Potassium 4.7 3.5 - 5.2 mmol/L   Chloride 102 96 - 106 mmol/L   CO2 27 18 - 29 mmol/L   Calcium 9.3 8.7 - 10.2 mg/dL   Total Protein 7.1 6.0 - 8.5 g/dL   Albumin 4.3 3.5 - 5.5 g/dL   Globulin, Total 2.8 1.5 - 4.5 g/dL   Albumin/Globulin Ratio 1.5 1.2 - 2.2   Bilirubin Total 0.5 0.0 - 1.2 mg/dL   Alkaline Phosphatase 60 39 - 117 IU/L   AST 15 0 - 40 IU/L   ALT 14 0 - 32 IU/L  Lipid panel  Result Value Ref Range   Cholesterol, Total 278 (H) 100 - 199 mg/dL   Triglycerides 93 0 - 149 mg/dL   HDL 81 >39 mg/dL   VLDL Cholesterol Cal 19 5 - 40 mg/dL   LDL Calculated 178 (H) 0 - 99 mg/dL   Chol/HDL Ratio 3.4 0.0 - 4.4 ratio units  TSH  Result Value Ref Range   TSH 0.444 (  L) 0.450 - 4.500 uIU/mL      Assessment & Plan:   1. Essential hypertension - CBC with Differential/Platelet - Lipid panel  2. Well adult exam - CBC with Differential/Platelet - CMP14+EGFR - Lipid panel - TSH  3. Weight gain - CBC with Differential/Platelet - CMP14+EGFR - TSH  4. High risk medication use - CBC with Differential/Platelet - CMP14+EGFR - Lipid panel - TSH  5. Multiple sclerosis ([place])  6. Neuropathic pain of both legs   Continue all other maintenance medications as listed above. Educational handout given for Wrist and forearm exercises  Follow up plan:3-6 months  Terald Sleeper PA-C [place], [place]  [id number] [phone]   11/04/2016, 8:51 PM

## 2016-11-05 ENCOUNTER — Encounter: Payer: Self-pay | Admitting: Physician Assistant

## 2016-11-06 DIAGNOSIS — Z0289 Encounter for other administrative examinations: Secondary | ICD-10-CM

## 2016-11-20 ENCOUNTER — Other Ambulatory Visit: Payer: Self-pay | Admitting: Physician Assistant

## 2016-12-04 ENCOUNTER — Encounter: Payer: Self-pay | Admitting: Physician Assistant

## 2017-01-14 DIAGNOSIS — D72819 Decreased white blood cell count, unspecified: Secondary | ICD-10-CM | POA: Diagnosis not present

## 2017-01-14 DIAGNOSIS — G35 Multiple sclerosis: Secondary | ICD-10-CM | POA: Diagnosis not present

## 2017-01-14 DIAGNOSIS — R69 Illness, unspecified: Secondary | ICD-10-CM | POA: Diagnosis not present

## 2017-01-14 DIAGNOSIS — Z87898 Personal history of other specified conditions: Secondary | ICD-10-CM | POA: Diagnosis not present

## 2017-03-30 DIAGNOSIS — R531 Weakness: Secondary | ICD-10-CM | POA: Diagnosis not present

## 2017-03-30 DIAGNOSIS — W57XXXA Bitten or stung by nonvenomous insect and other nonvenomous arthropods, initial encounter: Secondary | ICD-10-CM | POA: Diagnosis not present

## 2017-03-31 ENCOUNTER — Emergency Department (HOSPITAL_COMMUNITY)
Admission: EM | Admit: 2017-03-31 | Discharge: 2017-03-31 | Disposition: A | Discharge status: Home / Self Care | Payer: Medicare HMO | Attending: Emergency Medicine | Admitting: Emergency Medicine

## 2017-03-31 ENCOUNTER — Encounter (HOSPITAL_COMMUNITY): Payer: Self-pay

## 2017-03-31 DIAGNOSIS — T7840XA Allergy, unspecified, initial encounter: Secondary | ICD-10-CM | POA: Diagnosis not present

## 2017-03-31 DIAGNOSIS — I10 Essential (primary) hypertension: Secondary | ICD-10-CM | POA: Diagnosis not present

## 2017-03-31 DIAGNOSIS — J45909 Unspecified asthma, uncomplicated: Secondary | ICD-10-CM | POA: Diagnosis not present

## 2017-03-31 DIAGNOSIS — M7989 Other specified soft tissue disorders: Secondary | ICD-10-CM | POA: Diagnosis present

## 2017-03-31 DIAGNOSIS — Z79899 Other long term (current) drug therapy: Secondary | ICD-10-CM | POA: Diagnosis not present

## 2017-03-31 DIAGNOSIS — E119 Type 2 diabetes mellitus without complications: Secondary | ICD-10-CM | POA: Insufficient documentation

## 2017-03-31 DIAGNOSIS — Z7982 Long term (current) use of aspirin: Secondary | ICD-10-CM | POA: Insufficient documentation

## 2017-03-31 DIAGNOSIS — W57XXXA Bitten or stung by nonvenomous insect and other nonvenomous arthropods, initial encounter: Secondary | ICD-10-CM | POA: Diagnosis not present

## 2017-03-31 LAB — CBG MONITORING, ED: Glucose-Capillary: 108 mg/dL — ABNORMAL HIGH (ref 65–99)

## 2017-03-31 MED ORDER — PREDNISONE 10 MG PO TABS
60.0000 mg | ORAL_TABLET | Freq: Once | ORAL | Status: AC
  Administered 2017-03-31: 60 mg via ORAL
  Filled 2017-03-31: qty 1

## 2017-03-31 MED ORDER — FAMOTIDINE 20 MG PO TABS: 20.0000 mg | ORAL_TABLET | Freq: Two times a day (BID) | ORAL | 0 refills | Status: DC

## 2017-03-31 MED ORDER — PREDNISONE 50 MG PO TABS: ORAL_TABLET | ORAL | 0 refills | Status: DC

## 2017-03-31 MED ORDER — DIPHENHYDRAMINE HCL 25 MG PO CAPS
25.0000 mg | ORAL_CAPSULE | Freq: Once | ORAL | Status: AC
  Administered 2017-03-31: 25 mg via ORAL
  Filled 2017-03-31: qty 1

## 2017-03-31 MED ORDER — FAMOTIDINE 20 MG PO TABS
20.0000 mg | ORAL_TABLET | Freq: Once | ORAL | Status: AC
  Administered 2017-03-31: 20 mg via ORAL
  Filled 2017-03-31: qty 1

## 2017-03-31 MED ORDER — DIPHENHYDRAMINE HCL 25 MG PO TABS: 25.0000 mg | ORAL_TABLET | Freq: Four times a day (QID) | ORAL | 0 refills | Status: DC

## 2017-03-31 NOTE — ED Provider Notes (Signed)
[facility] DEPT Provider Note   CSN: [id number] Arrival date & time: 03/31/17  0417     History   Chief Complaint Chief Complaint  Patient presents with  . Insect Bite    HPI [name] is a 41 y.o. female.  Patient presents with worsening redness, pain and swelling to left forearm. States she was planting roses in her garden around 37 PM when she felt 2 separate bites or stings on her left arm. She did not see what bit or stung her. She went to urgent care and was told to watch it closely. She comes in tonight because the pain is worsening in her arms become more red and swollen. The redness has spread beyond marked borders. She denies any difficulty breathing or swallowing. Denies any chest pain or shortness of breath. Denies any fever or vomiting. Denies any previous similar reactions in the past. Patient with history of multiple sclerosis and receives immunosuppressive infusion (ocrevus) every 6 months last one was in December. She has applied some topical benadryl but taken no other medications.   The history is provided by the patient.    Past Medical History:  Diagnosis Date  . Asthma   . Diabetes mellitus   . Gastroparesis   . Hypertension   . Migraine headache   . Multiple sclerosis (HCC)    Dr. Jannifer Franklin  . 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 Neurogenic bladder   . Neurogenic bowel   . Obesity   . Obesity   . PCOS (polycystic ovarian syndrome)   . Polyneuropathy   . Seizures [facility])     Patient Active Problem List   Diagnosis Date Noted  . Neuropathic pain of both legs 09/16/2013  . Exacerbation of multiple sclerosis ([place]) 08/01/2013  . Multiple sclerosis exacerbation ([place]) 06/30/2013  . MIGRAINE HEADACHE 12/15/2010  . SINUSITIS 01/19/2010  . BRONCHITIS 01/12/2010  . Multiple sclerosis ([place]) 06/16/2009  . ASTHMA, WITH ACUTE EXACERBATION 03/23/2009  . ALLERGIC RHINITIS 02/07/2009  . ASTHMATIC BRONCHITIS, ACUTE 12/17/2008  . OBESITY,  MORBID 02/23/2008  . FATTY LIVER DISEASE 01/27/2008  . HYPERLIPIDEMIA 01/19/2008  . Essential hypertension 06/27/2007  . ASTHMA 06/27/2007  . GERD 06/27/2007    Past Surgical History:  Procedure Laterality Date  . ABDOMINAL HYSTERECTOMY    . CESAREAN SECTION    . CHOLECYSTECTOMY    . COLON SURGERY    . DILATION AND CURETTAGE OF UTERUS    . IUD REMOVAL  03/19/2012   denies  . LAPAROSCOPIC GASTRIC BANDING  01/26/08    OB History    Gravida Para Term Preterm AB Living   1 1 1     1    SAB TAB Ectopic Multiple Live Births   0   0 0         Home Medications    Prior to Admission medications   Medication Sig Start Date End Date Taking? Authorizing Provider  acetaminophen (TYLENOL) 500 MG tablet Take 1,000 mg by mouth every 6 (six) hours as needed for mild pain or headache.    Historical Provider, MD  albuterol (PROVENTIL) (2.5 MG/3ML) 0.083% nebulizer solution INHALE 1 VIAL VIA NEB EVERY 4-6 HOURS AS NEEDED FOR SHORTNESS OF BREATH 09/21/16   Historical Provider, MD  amphetamine-dextroamphetamine (ADDERALL) 20 MG tablet Take 20 mg by mouth 2 (two) times daily. 10/17/16   Historical Provider, MD  aspirin EC 81 MG tablet Take 81 mg by mouth daily.    Historical Provider, MD  baclofen (LIORESAL) 10  MG tablet Take 10 mg by mouth 3 (three) times daily. Muscle spasm    Historical Provider, MD  Cholecalciferol (VITAMIN D3) 5000 units CAPS Take 1 capsule by mouth daily.    Historical Provider, MD  Cyanocobalamin (VITAMIN B-12) 5000 MCG LOZG Take 1 tablet by mouth daily.    Historical Provider, MD  diclofenac sodium (VOLTAREN) 1 % GEL Apply topically 4 (four) times daily.    Historical Provider, MD  FLUoxetine (PROZAC) 20 MG tablet TAKE 3 TABLETS EVERY DAY 09/07/16   Terald Sleeper, PA-C  gabapentin (NEURONTIN) 600 MG tablet Take 1,200 mg by mouth 4 (four) times daily. 10/10/16   Historical Provider, MD  HYDROcodone-acetaminophen (NORCO) 10-325 MG tablet Take 1 tablet by mouth 2 (two) times  daily.  10/16/16   Historical Provider, MD  levETIRAcetam (KEPPRA) 500 MG tablet Take 500 mg by mouth 2 (two) times daily.    Historical Provider, MD  lisinopril (PRINIVIL,ZESTRIL) 10 MG tablet Take 10 mg by mouth daily.    Historical Provider, MD  metoprolol tartrate (LOPRESSOR) 25 MG tablet TAKE 1 TABLET TWICE A DAY 11/21/16   Terald Sleeper, PA-C  Multiple Vitamins-Minerals (MULTIVITAMINS THER. W/MINERALS) TABS tablet Take 1 tablet by mouth daily.    Historical Provider, MD  oxybutynin (DITROPAN) 5 MG tablet Take 5 mg by mouth 3 (three) times daily.    Historical Provider, MD  Probiotic Product (PROBIOTIC-10 PO) Take by mouth.    Historical Provider, MD  promethazine (PHENERGAN) 12.5 MG tablet Take 12.5 mg by mouth every 4 (four) hours as needed for nausea or vomiting.    Historical Provider, MD  simvastatin (ZOCOR) 10 MG tablet Take 1 tablet (10 mg total) by mouth at bedtime. 11/02/16   Terald Sleeper, PA-C  triamcinolone cream (KENALOG) 0.5 % Apply 1 application topically daily as needed.    Historical Provider, MD    Family History Family History  Problem Relation Age of Onset  . Diabetes Mother   . Depression Mother   . Hypertension Mother   . Liver disease Father   . Depression Daughter     bipolar depression  . Coronary artery disease Other   . Cancer Other     ovarian  . Anesthesia problems Neg Hx     Social History Social History  Substance Use Topics  . Smoking status: Never Smoker  . Smokeless tobacco: Never Used  . Alcohol use No     Allergies   Metformin and related   Review of Systems Review of Systems  Constitutional: Negative for activity change, appetite change and fever.  HENT: Negative for congestion and rhinorrhea.   Respiratory: Negative for cough, chest tightness and shortness of breath.   Cardiovascular: Negative for chest pain.  Gastrointestinal: Negative for abdominal pain, nausea and vomiting.  Genitourinary: Negative for dysuria, hematuria,  vaginal bleeding and vaginal discharge.  Musculoskeletal: Negative for arthralgias and myalgias.  Skin: Positive for wound.  Neurological: Negative for dizziness, weakness, light-headedness and headaches.   all other systems are negative except as noted in the HPI and PMH.     Physical Exam Updated Vital Signs BP 107/80 (BP Location: Right Arm)   Pulse 70   Temp 97.9 F (36.6 C) (Oral)   Resp 16   Ht 5\' 3"  (1.6 m)   Wt 192 lb (87.1 kg)   LMP 03/17/2012   SpO2 100%   BMI 34.01 kg/m   Physical Exam  Constitutional: She is oriented to person, place, and time. She  appears well-developed and well-nourished. No distress.  HENT:  Head: Normocephalic and atraumatic.  Mouth/Throat: Oropharynx is clear and moist. No oropharyngeal exudate.  No tongue or lip swelling  Eyes: Conjunctivae and EOM are normal. Pupils are equal, round, and reactive to light.  Neck: Normal range of motion. Neck supple.  No meningismus.  Cardiovascular: Normal rate, regular rhythm, normal heart sounds and intact distal pulses.   No murmur heard. Pulmonary/Chest: Effort normal and breath sounds normal. No respiratory distress.  Abdominal: Soft. There is no tenderness. There is no rebound and no guarding.  Musculoskeletal: Normal range of motion. She exhibits edema. She exhibits no tenderness.  6 x8 cm area of erythema and edema to L forearm as depicted. Has spread beyond marked borders FROM L wrist and elbow.  Neurological: She is alert and oriented to person, place, and time. No cranial nerve deficit. She exhibits normal muscle tone. Coordination normal.   5/5 strength throughout. CN 2-12 intact.Equal grip strength.   Skin: Skin is warm.  Psychiatric: She has a normal mood and affect. Her behavior is normal.  Nursing note and vitals reviewed.      ED Treatments / Results  Labs (all labs ordered are listed, but only abnormal results are displayed) Labs Reviewed  CBG MONITORING, ED - Abnormal; Notable  for the following:       Result Value   Glucose-Capillary 108 (*)    All other components within normal limits    EKG  EKG Interpretation None       Radiology No results found.  Procedures Procedures (including critical care time)  Medications Ordered in ED Medications  predniSONE (DELTASONE) tablet 60 mg (not administered)  diphenhydrAMINE (BENADRYL) capsule 25 mg (not administered)  famotidine (PEPCID) tablet 20 mg (not administered)     Initial Impression / Assessment and Plan / ED Course  I have reviewed the triage vital signs and the nursing notes.  Pertinent labs & imaging results that were available during my care of the patient were reviewed by me and considered in my medical decision making (see chart for details).     Patient with apparent local allergic reaction to bug bite or sting. She has no difficulty breathing or swallowing. Suspect allergic reaction rather than infection given acute onset. No fever or vomiting.  Patient given steroids and antihistamines. She has no tongue or lip swelling. She does not have any chest pain or shortness of breath.  On recheck there is improvement significant only of patient's erythema and swelling of her forearm. Suspect allergic reaction rather than an infectious process given rapid onset.  Continue antihistamines and steroids. Return precautions discussed including worsening pain, difficulty breathing, difficulty swallowing or any other concerns.  Final Clinical Impressions(s) / ED Diagnoses   Final diagnoses:  Allergic reaction, initial encounter    New Prescriptions New Prescriptions   No medications on file     Ezequiel Essex, MD 03/31/17 [phone]

## 2017-03-31 NOTE — ED Notes (Signed)
Ice pack applied to left FA

## 2017-03-31 NOTE — ED Triage Notes (Signed)
patient reports feeling "bite" to left FA when planting roses yesterday. Was seen at urgent care yesterday afternoon and they marked area of possible bite which has spread past the marked are. Increased redness and warmth noted.

## 2017-03-31 NOTE — Discharge Instructions (Signed)
Take the steroids and antihistamines as prescribed. Follow-up with your doctor. Return to the ED if you develop worsening symptoms including difficulty breathing, difficulty swallowing or any other concerns.

## 2017-04-15 DIAGNOSIS — Z87898 Personal history of other specified conditions: Secondary | ICD-10-CM | POA: Diagnosis not present

## 2017-04-15 DIAGNOSIS — G35 Multiple sclerosis: Secondary | ICD-10-CM | POA: Diagnosis not present

## 2017-04-15 DIAGNOSIS — R69 Illness, unspecified: Secondary | ICD-10-CM | POA: Diagnosis not present

## 2017-04-15 DIAGNOSIS — D72819 Decreased white blood cell count, unspecified: Secondary | ICD-10-CM | POA: Diagnosis not present

## 2017-05-15 ENCOUNTER — Encounter (HOSPITAL_COMMUNITY): Payer: Self-pay

## 2017-05-15 ENCOUNTER — Ambulatory Visit (HOSPITAL_COMMUNITY)
Admission: RE | Admit: 2017-05-15 | Discharge: 2017-05-15 | Disposition: A | Discharge status: Home / Self Care | Payer: Medicare HMO | Source: Ambulatory Visit | Attending: Psychiatry | Admitting: Psychiatry

## 2017-05-15 DIAGNOSIS — G35 Multiple sclerosis: Secondary | ICD-10-CM | POA: Insufficient documentation

## 2017-05-15 HISTORY — DX: Angina pectoris, unspecified: I20.9

## 2017-05-15 MED ORDER — DIPHENHYDRAMINE HCL 50 MG/ML IJ SOLN
25.0000 mg | Freq: Once | INTRAMUSCULAR | Status: AC
  Administered 2017-05-15: 25 mg via INTRAVENOUS
  Filled 2017-05-15: qty 1

## 2017-05-15 MED ORDER — SODIUM CHLORIDE 0.9 % IV SOLN
600.0000 mg | Freq: Once | INTRAVENOUS | Status: AC
Start: 2017-05-15 — End: 2017-05-15
  Administered 2017-05-15: 600 mg via INTRAVENOUS
  Filled 2017-05-15: qty 20

## 2017-05-15 MED ORDER — SODIUM CHLORIDE 0.9 % IV SOLN
Freq: Once | INTRAVENOUS | Status: AC
  Administered 2017-05-15: 08:00:00 via INTRAVENOUS

## 2017-05-15 MED ORDER — METHYLPREDNISOLONE SODIUM SUCC 125 MG IJ SOLR
125.0000 mg | Freq: Once | INTRAMUSCULAR | Status: AC
  Administered 2017-05-15: 125 mg via INTRAVENOUS
  Filled 2017-05-15: qty 2

## 2017-05-15 MED ORDER — ACETAMINOPHEN 500 MG PO TABS
1000.0000 mg | ORAL_TABLET | Freq: Once | ORAL | Status: AC
  Administered 2017-05-15: 1000 mg via ORAL
  Filled 2017-05-15: qty 2

## 2017-05-15 NOTE — Progress Notes (Signed)
Ocrevus infusion continues at 160cc/h and Pt c/o migraine type headache "8/10".  Attempt call to office, left message on machine. Await call back. IV rate decreased to 80cc/h.

## 2017-05-15 NOTE — Discharge Instructions (Signed)
Ocrelizumab injection / infusion What is this medicine? OCRELIZUMAB (ok re LIZ ue mab) treats multiple sclerosis. It helps to decrease the number of multiple sclerosis relapses. It is not a cure. This medicine may be used for other purposes; ask your health care provider or pharmacist if you have questions. COMMON BRAND NAME(S): OCREVUS What should I tell my health care provider before I take this medicine? They need to know if you have any of these conditions: -cancer -hepatitis B infection -other infection (especially a virus infection such as chickenpox, cold sores, or herpes) -an unusual or allergic reaction to ocrelizumab, other medicines, foods, dyes or preservatives -pregnant or trying to get pregnant -breast-feeding How should I use this medicine? This medicine is for infusion into a vein. It is given by a health care professional in a hospital or clinic setting. Talk to your pediatrician regarding the use of this medicine in children. Special care may be needed. Overdosage: If you think you have taken too much of this medicine contact a poison control center or emergency room at once. NOTE: This medicine is only for you. Do not share this medicine with others. What if I miss a dose? Keep appointments for follow-up doses as directed. It is important not to miss your dose. Call your doctor or health care professional if you are unable to keep an appointment. What may interact with this medicine? -alemtuzumab -daclizumab -dimethyl fumarate -fingolimod -glatiramer -interferon beta -live virus vaccines -mitoxantrone -natalizumab -peginterferon beta -rituximab -steroid medicines like prednisone or cortisone -teriflunomide This list may not describe all possible interactions. Give your health care provider a list of all the medicines, herbs, non-prescription drugs, or dietary supplements you use. Also tell them if you smoke, drink alcohol, or use illegal drugs. Some items may  interact with your medicine. What should I watch for while using this medicine? Tell your doctor or healthcare professional if your symptoms do not start to get better or if they get worse. This medicine can cause serious allergic reactions. To reduce your risk you may need to take medicine before treatment with this medicine. Take your medicine as directed. Women should inform their doctor if they wish to become pregnant or think they might be pregnant. There is a potential for serious side effects to an unborn child. Talk to your health care professional or pharmacist for more information. Female patients should use effective birth control methods while receiving this medicine and for 6 months after the last dose. Call your doctor or health care professional for advice if you get a fever, chills or sore throat, or other symptoms of a cold or flu. Do not treat yourself. This drug decreases your body's ability to fight infections. Try to avoid being around people who are sick. If you have a hepatitis B infection or a history of a hepatitis B infection, talk to your doctor. The symptoms of hepatitis B may get worse if you take this medicine. In some patients, this medicine may cause a serious brain infection that may cause death. If you have any problems seeing, thinking, speaking, walking, or standing, tell your doctor right away. If you cannot reach your doctor, urgently seek other source of medical care. This medicine can decrease the response to a vaccine. If you need to get vaccinated, tell your healthcare professional if you have received this medicine. Extra booster doses may be needed. Talk to your doctor to see if a different vaccination schedule is needed. Talk to your doctor about your risk  of cancer. You may be more at risk for certain types of cancers if you take this medicine. What side effects may I notice from receiving this medicine? Side effects that you should report to your doctor or  health care professional as soon as possible: -allergic reactions like skin rash, itching or hives, swelling of the face, lips, or tongue -breathing problems -facial flushing -fast, irregular heartbeat -lump or soreness in the breast -signs and symptoms of herpes such as cold sore, shingles, or genital sores -signs and symptoms of infection like fever or chills, cough, sore throat, pain or trouble passing urine -signs and symptoms of low blood pressure like dizziness; feeling faint or lightheaded, falls; unusually weak or tired -signs and symptoms of progressive multifocal leukoencephalopathy (PML) like changes in vision; clumsiness; confusion; personality changes; weakness on one side of the body -swelling of the ankles, feet, hands Side effects that usually do not require medical attention (report these to your doctor or health care professional if they continue or are bothersome): -back pain -depressed mood -diarrhea -pain, redness, or irritation at site where injected This list may not describe all possible side effects. Call your doctor for medical advice about side effects. You may report side effects to FDA at 1-800-FDA-1088. Where should I keep my medicine? This drug is given in a hospital or clinic and will not be stored at home. NOTE: This sheet is a summary. It may not cover all possible information. If you have questions about this medicine, talk to your doctor, pharmacist, or health care provider.  2018 Elsevier/Gold Standard (2016-03-06 09:40:25)

## 2017-05-15 NOTE — Progress Notes (Signed)
Attempt MD office again and spoke with [facility] medical staff to report headache symptoms to Dr D. Jeffery.  She took message and will call back if further instructions / orders are needed.  IV ocrevus infusion complete, continue IV NS until discharge 1 hour post infusion.

## 2017-05-15 NOTE — Progress Notes (Signed)
Pt experienced reaction to Ocrevus infusion at 1130.  Dr Oliva Bustard notified (via office staff [place]) of Pt c/o itching / sore throat and itching ears as well as overall "heaviness."  Order taken to redose solumedrol and benadryl. Given per orders.  At 1330 Pt denies sore throat and itching ears and states overall generalized, chronic pain is "3/10" now.  Denies symptoms at present and tolerating infusion increased to 160cc/h.

## 2017-06-16 ENCOUNTER — Other Ambulatory Visit: Payer: Self-pay | Admitting: Physician Assistant

## 2017-06-20 ENCOUNTER — Telehealth: Payer: Self-pay | Admitting: Physician Assistant

## 2017-06-20 MED ORDER — DICLOFENAC SODIUM 1 % TD GEL
4.0000 g | Freq: Four times a day (QID) | TRANSDERMAL | 5 refills | Status: DC
Start: 2017-06-20 — End: 2017-07-09

## 2017-06-20 NOTE — Telephone Encounter (Signed)
Script has been sent.

## 2017-06-20 NOTE — Telephone Encounter (Signed)
Pt notified of new RX 

## 2017-07-02 ENCOUNTER — Other Ambulatory Visit: Payer: Self-pay | Admitting: *Deleted

## 2017-07-02 MED ORDER — GABAPENTIN 600 MG PO TABS: 1200.0000 mg | ORAL_TABLET | Freq: Four times a day (QID) | ORAL | 0 refills | Status: DC

## 2017-07-09 ENCOUNTER — Telehealth: Payer: Self-pay | Admitting: Physician Assistant

## 2017-07-09 ENCOUNTER — Other Ambulatory Visit: Payer: Self-pay | Admitting: Physician Assistant

## 2017-07-09 MED ORDER — DICLOFENAC SODIUM 1 % TD GEL: 4.0000 g | Freq: Four times a day (QID) | TRANSDERMAL | 5 refills | Status: DC

## 2017-07-09 NOTE — Telephone Encounter (Signed)
rx sent to [facility] in [place] as it had been sent to [facility] in [place] and pt states she has never used [facility].

## 2017-08-10 ENCOUNTER — Other Ambulatory Visit: Payer: Self-pay | Admitting: Physician Assistant

## 2017-08-12 NOTE — Telephone Encounter (Signed)
Last lipid 10/31/16 

## 2017-08-26 ENCOUNTER — Encounter (HOSPITAL_COMMUNITY): Payer: Self-pay

## 2017-10-25 ENCOUNTER — Other Ambulatory Visit: Payer: Self-pay | Admitting: Physician Assistant

## 2017-11-17 ENCOUNTER — Other Ambulatory Visit: Payer: Self-pay | Admitting: Physician Assistant

## 2017-11-19 ENCOUNTER — Encounter (HOSPITAL_COMMUNITY): Payer: Self-pay

## 2017-11-19 ENCOUNTER — Ambulatory Visit (HOSPITAL_COMMUNITY)
Admission: RE | Admit: 2017-11-19 | Discharge: 2017-11-19 | Disposition: A | Discharge status: Home / Self Care | Payer: Medicare HMO | Source: Ambulatory Visit | Attending: Psychiatry | Admitting: Psychiatry

## 2017-11-19 DIAGNOSIS — G35 Multiple sclerosis: Secondary | ICD-10-CM | POA: Diagnosis not present

## 2017-11-19 MED ORDER — DIPHENHYDRAMINE HCL 50 MG/ML IJ SOLN
25.0000 mg | INTRAMUSCULAR | Status: AC
  Administered 2017-11-19: 25 mg via INTRAVENOUS
  Filled 2017-11-19: qty 1

## 2017-11-19 MED ORDER — DIPHENHYDRAMINE HCL 50 MG/ML IJ SOLN
25.0000 mg | Freq: Once | INTRAMUSCULAR | Status: AC
  Administered 2017-11-19: 25 mg via INTRAVENOUS
  Filled 2017-11-19: qty 1

## 2017-11-19 MED ORDER — SODIUM CHLORIDE 0.9 % IV SOLN
Freq: Once | INTRAVENOUS | Status: AC
  Administered 2017-11-19: 08:00:00 via INTRAVENOUS

## 2017-11-19 MED ORDER — SODIUM CHLORIDE 0.9 % IV SOLN
600.0000 mg | Freq: Once | INTRAVENOUS | Status: AC
  Administered 2017-11-19: 600 mg via INTRAVENOUS
  Filled 2017-11-19: qty 20

## 2017-11-19 MED ORDER — ACETAMINOPHEN 500 MG PO TABS
1000.0000 mg | ORAL_TABLET | Freq: Once | ORAL | Status: AC
  Administered 2017-11-19: 1000 mg via ORAL
  Filled 2017-11-19: qty 2

## 2017-11-19 MED ORDER — METHYLPREDNISOLONE SODIUM SUCC 125 MG IJ SOLR
125.0000 mg | Freq: Once | INTRAMUSCULAR | Status: AC
Start: 2017-11-19 — End: 2017-11-19
  Administered 2017-11-19: 125 mg via INTRAVENOUS
  Filled 2017-11-19: qty 2

## 2017-11-19 NOTE — Progress Notes (Signed)
Pt. Tolerated only going to 120 ml/hr , with another dose of benadryl given, pt. Had c/o itching and scratchy throat, dose reduced, than benadryl given, was able to increase back up to 173ml/hr. Pt. To follow-up with doctor with any problems.

## 2017-11-19 NOTE — Progress Notes (Signed)
Patient c/o itching and throat scratchy. Rate decreased to 60 ml/hr, Vss. Rn calling office to notify.[name]

## 2017-11-19 NOTE — Progress Notes (Signed)
Waiting on office to return call,pt. Stated" throat is better, itching is the same". Vss.

## 2017-11-19 NOTE — Progress Notes (Signed)
Continuing to wait on office to return call, another message left, Rate decreased to 57ml/hr due to itching, Vss.

## 2017-11-19 NOTE — Discharge Instructions (Signed)
Ocrelizumab injection What is this medicine? OCRELIZUMAB (ok re LIZ ue mab) treats multiple sclerosis. It helps to decrease the number of multiple sclerosis relapses. It is not a cure. This medicine may be used for other purposes; ask your health care provider or pharmacist if you have questions. COMMON BRAND NAME(S): OCREVUS What should I tell my health care provider before I take this medicine? They need to know if you have any of these conditions: -cancer -hepatitis B infection -other infection (especially a virus infection such as chickenpox, cold sores, or herpes) -an unusual or allergic reaction to ocrelizumab, other medicines, foods, dyes or preservatives -pregnant or trying to get pregnant -breast-feeding How should I use this medicine? This medicine is for infusion into a vein. It is given by a health care professional in a hospital or clinic setting. Talk to your pediatrician regarding the use of this medicine in children. Special care may be needed. Overdosage: If you think you have taken too much of this medicine contact a poison control center or emergency room at once. NOTE: This medicine is only for you. Do not share this medicine with others. What if I miss a dose? Keep appointments for follow-up doses as directed. It is important not to miss your dose. Call your doctor or health care professional if you are unable to keep an appointment. What may interact with this medicine? -alemtuzumab -daclizumab -dimethyl fumarate -fingolimod -glatiramer -interferon beta -live virus vaccines -mitoxantrone -natalizumab -peginterferon beta -rituximab -steroid medicines like prednisone or cortisone -teriflunomide This list may not describe all possible interactions. Give your health care provider a list of all the medicines, herbs, non-prescription drugs, or dietary supplements you use. Also tell them if you smoke, drink alcohol, or use illegal drugs. Some items may interact with  your medicine. What should I watch for while using this medicine? Tell your doctor or healthcare professional if your symptoms do not start to get better or if they get worse. This medicine can cause serious allergic reactions. To reduce your risk you may need to take medicine before treatment with this medicine. Take your medicine as directed. Women should inform their doctor if they wish to become pregnant or think they might be pregnant. There is a potential for serious side effects to an unborn child. Talk to your health care professional or pharmacist for more information. Female patients should use effective birth control methods while receiving this medicine and for 6 months after the last dose. Call your doctor or health care professional for advice if you get a fever, chills or sore throat, or other symptoms of a cold or flu. Do not treat yourself. This drug decreases your body's ability to fight infections. Try to avoid being around people who are sick. If you have a hepatitis B infection or a history of a hepatitis B infection, talk to your doctor. The symptoms of hepatitis B may get worse if you take this medicine. In some patients, this medicine may cause a serious brain infection that may cause death. If you have any problems seeing, thinking, speaking, walking, or standing, tell your doctor right away. If you cannot reach your doctor, urgently seek other source of medical care. This medicine can decrease the response to a vaccine. If you need to get vaccinated, tell your healthcare professional if you have received this medicine. Extra booster doses may be needed. Talk to your doctor to see if a different vaccination schedule is needed. Talk to your doctor about your risk of cancer.   You may be more at risk for certain types of cancers if you take this medicine. What side effects may I notice from receiving this medicine? Side effects that you should report to your doctor or health care  professional as soon as possible: -allergic reactions like skin rash, itching or hives, swelling of the face, lips, or tongue -breathing problems -facial flushing -fast, irregular heartbeat -lump or soreness in the breast -signs and symptoms of herpes such as cold sore, shingles, or genital sores -signs and symptoms of infection like fever or chills, cough, sore throat, pain or trouble passing urine -signs and symptoms of low blood pressure like dizziness; feeling faint or lightheaded, falls; unusually weak or tired -signs and symptoms of progressive multifocal leukoencephalopathy (PML) like changes in vision; clumsiness; confusion; personality changes; weakness on one side of the body -swelling of the ankles, feet, hands Side effects that usually do not require medical attention (report these to your doctor or health care professional if they continue or are bothersome): -back pain -depressed mood -diarrhea -pain, redness, or irritation at site where injected This list may not describe all possible side effects. Call your doctor for medical advice about side effects. You may report side effects to FDA at 1-800-FDA-1088. Where should I keep my medicine? This drug is given in a hospital or clinic and will not be stored at home. NOTE: This sheet is a summary. It may not cover all possible information. If you have questions about this medicine, talk to your doctor, pharmacist, or health care provider.  2018 Elsevier/Gold Standard (2016-03-06 09:40:25)  

## 2017-11-19 NOTE — Progress Notes (Signed)
Office called RN back, order received for another dose of benadryl for itching.

## 2018-01-27 DIAGNOSIS — D72819 Decreased white blood cell count, unspecified: Secondary | ICD-10-CM | POA: Diagnosis not present

## 2018-01-27 DIAGNOSIS — Z87898 Personal history of other specified conditions: Secondary | ICD-10-CM | POA: Diagnosis not present

## 2018-01-27 DIAGNOSIS — E079 Disorder of thyroid, unspecified: Secondary | ICD-10-CM | POA: Diagnosis not present

## 2018-01-27 DIAGNOSIS — G35 Multiple sclerosis: Secondary | ICD-10-CM | POA: Diagnosis not present

## 2018-01-27 DIAGNOSIS — R69 Illness, unspecified: Secondary | ICD-10-CM | POA: Diagnosis not present

## 2018-01-28 DIAGNOSIS — Z87898 Personal history of other specified conditions: Secondary | ICD-10-CM | POA: Diagnosis not present

## 2018-01-28 DIAGNOSIS — D72819 Decreased white blood cell count, unspecified: Secondary | ICD-10-CM | POA: Diagnosis not present

## 2018-01-28 DIAGNOSIS — R69 Illness, unspecified: Secondary | ICD-10-CM | POA: Diagnosis not present

## 2018-01-28 DIAGNOSIS — E079 Disorder of thyroid, unspecified: Secondary | ICD-10-CM | POA: Diagnosis not present

## 2018-01-28 DIAGNOSIS — G35 Multiple sclerosis: Secondary | ICD-10-CM | POA: Diagnosis not present

## 2018-01-28 DIAGNOSIS — E559 Vitamin D deficiency, unspecified: Secondary | ICD-10-CM | POA: Diagnosis not present

## 2018-01-28 DIAGNOSIS — E538 Deficiency of other specified B group vitamins: Secondary | ICD-10-CM | POA: Diagnosis not present

## 2018-01-30 ENCOUNTER — Ambulatory Visit (INDEPENDENT_AMBULATORY_CARE_PROVIDER_SITE_OTHER): Payer: Medicare HMO | Admitting: Physician Assistant

## 2018-01-30 ENCOUNTER — Encounter: Payer: Self-pay | Admitting: Physician Assistant

## 2018-01-30 VITALS — BP 146/105 | HR 95 | Temp 98.9°F | Ht 63.0 in | Wt 219.2 lb

## 2018-01-30 DIAGNOSIS — K219 Gastro-esophageal reflux disease without esophagitis: Secondary | ICD-10-CM | POA: Diagnosis not present

## 2018-01-30 DIAGNOSIS — I10 Essential (primary) hypertension: Secondary | ICD-10-CM | POA: Diagnosis not present

## 2018-01-30 DIAGNOSIS — G35 Multiple sclerosis: Secondary | ICD-10-CM

## 2018-01-30 MED ORDER — SIMVASTATIN 10 MG PO TABS: 10.0000 mg | ORAL_TABLET | Freq: Every day | ORAL | 3 refills | Status: DC

## 2018-01-30 MED ORDER — OXYBUTYNIN CHLORIDE 5 MG PO TABS: 5.0000 mg | ORAL_TABLET | Freq: Three times a day (TID) | ORAL | 3 refills | Status: DC

## 2018-01-30 MED ORDER — LEVETIRACETAM 500 MG PO TABS: 500.0000 mg | ORAL_TABLET | Freq: Two times a day (BID) | ORAL | 3 refills | Status: DC

## 2018-01-30 MED ORDER — TRIAMCINOLONE ACETONIDE 0.5 % EX CREA: 1.0000 "application " | TOPICAL_CREAM | Freq: Every day | CUTANEOUS | 11 refills | Status: DC | PRN

## 2018-01-30 MED ORDER — FLUOXETINE HCL 20 MG PO TABS: ORAL_TABLET | ORAL | 3 refills | Status: DC

## 2018-01-30 MED ORDER — METOPROLOL TARTRATE 25 MG PO TABS: 25.0000 mg | ORAL_TABLET | Freq: Two times a day (BID) | ORAL | 3 refills | Status: DC

## 2018-01-30 MED ORDER — DICLOFENAC SODIUM 1 % TD GEL: 4.0000 g | Freq: Four times a day (QID) | TRANSDERMAL | 3 refills | Status: AC

## 2018-01-30 MED ORDER — LISINOPRIL 10 MG PO TABS
10.0000 mg | ORAL_TABLET | Freq: Every day | ORAL | 3 refills | Status: DC
Start: 2018-01-30 — End: 2020-08-16

## 2018-01-30 MED ORDER — ONDANSETRON 4 MG PO TBDP: 4.0000 mg | ORAL_TABLET | Freq: Three times a day (TID) | ORAL | 3 refills | Status: AC | PRN

## 2018-01-30 MED ORDER — BACLOFEN 10 MG PO TABS
10.0000 mg | ORAL_TABLET | Freq: Three times a day (TID) | ORAL | 3 refills | Status: AC
Start: 2018-01-30 — End: ?

## 2018-01-30 MED ORDER — GABAPENTIN 600 MG PO TABS: ORAL_TABLET | ORAL | 0 refills | Status: DC

## 2018-01-30 NOTE — Progress Notes (Signed)
BP (!) 146/105   Pulse 95   Temp 98.9 F (37.2 C) (Oral)   Ht 5\' 3"  (1.6 m)   Wt 219 lb 3.2 oz (99.4 kg)   LMP 03/17/2012   BMI 38.83 kg/m    Subjective:    Patient ID: [name], female    DOB: [birth year], 42 y.o.   MRN: [id number]  HPI: [name] is a 42 y.o. female presenting on 01/30/2018 for Follow-up (medication refill )  This patient comes in for periodic recheck on medications and conditions including multiple sclerosis, GERD, hypertension.  She will be moving to [facility] for her [place]. She will be getting established with neurology, Dr Gorden Harms is setting her up.   All medications are reviewed today. There are no reports of any problems with the medications. All of the medical conditions are reviewed and updated.  Lab work is reviewed and will be ordered as medically necessary. There are no new problems reported with today's visit.   Past Medical History:  Diagnosis Date  . Anginal pain ([place])   . Asthma   . Gastroparesis   . Hypertension   . Migraine headache   . Multiple sclerosis (HCC)    Dr. Jannifer Franklin  . 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 Neurogenic bladder   . Neurogenic bowel   . Obesity   . Obesity   . PCOS (polycystic ovarian syndrome)   . Polyneuropathy   . Seizures ([place])    Relevant past medical, surgical, family and social history reviewed and updated as indicated. Interim medical history since our last visit reviewed. Allergies and medications reviewed and updated. DATA REVIEWED: CHART IN EPIC  Family History reviewed for pertinent findings.  Review of Systems  Constitutional: Negative.  Negative for activity change, fatigue and fever.  HENT: Negative.   Eyes: Negative.   Respiratory: Negative.  Negative for cough.   Cardiovascular: Negative.  Negative for chest pain.  Gastrointestinal: Negative.  Negative for abdominal pain.  Endocrine: Negative.   Genitourinary: Negative.  Negative for dysuria.    Musculoskeletal: Positive for arthralgias and back pain.  Skin: Negative.   Neurological: Negative.     Allergies as of 01/30/2018      Reactions   Metformin And Related    Diarrhea with higher doses      Medication List        Accurate as of 01/30/18  3:13 PM. Always use your most recent med list.          acetaminophen 500 MG tablet Commonly known as:  TYLENOL Take 1,000 mg by mouth every 6 (six) hours as needed for mild pain or headache.   albuterol (2.5 MG/3ML) 0.083% nebulizer solution Commonly known as:  PROVENTIL INHALE 1 VIAL VIA NEB EVERY 4-6 HOURS AS NEEDED FOR SHORTNESS OF BREATH   amphetamine-dextroamphetamine 20 MG tablet Commonly known as:  ADDERALL Take 20 mg by mouth 2 (two) times daily.   aspirin EC 81 MG tablet Take 81 mg by mouth daily.   baclofen 10 MG tablet Commonly known as:  LIORESAL Take 1 tablet (10 mg total) by mouth 3 (three) times daily. Muscle spasm   diclofenac sodium 1 % Gel Commonly known as:  VOLTAREN Apply 4 g topically 4 (four) times daily.   diphenhydrAMINE 25 MG tablet Commonly known as:  BENADRYL Take 1 tablet (25 mg total) by mouth every 6 (six) hours.   famotidine 20 MG tablet Commonly known as:  PEPCID Take 1 tablet (20 mg  total) by mouth 2 (two) times daily.   FLUoxetine 20 MG tablet Commonly known as:  PROZAC TAKE 3 TABLETS EVERY DAY   gabapentin 600 MG tablet Commonly known as:  NEURONTIN TAKE 2 TABLETS 4 TIMES A DAY   HYDROcodone-acetaminophen 10-325 MG tablet Commonly known as:  NORCO Take 1 tablet by mouth 2 (two) times daily.   levETIRAcetam 500 MG tablet Commonly known as:  KEPPRA Take 1 tablet (500 mg total) by mouth 2 (two) times daily.   lisinopril 10 MG tablet Commonly known as:  PRINIVIL,ZESTRIL Take 1 tablet (10 mg total) by mouth daily.   methylPREDNISolone sodium succinate 125 mg/2 mL injection Commonly known as:  SOLU-MEDROL Inject into the vein once. With infusion   metoprolol  tartrate 25 MG tablet Commonly known as:  LOPRESSOR Take 1 tablet (25 mg total) by mouth 2 (two) times daily.   multivitamins ther. w/minerals Tabs tablet Take 1 tablet by mouth daily.   ondansetron 4 MG disintegrating tablet Commonly known as:  ZOFRAN-ODT Take 1 tablet (4 mg total) by mouth every 8 (eight) hours as needed for nausea or vomiting.   oxybutynin 5 MG tablet Commonly known as:  DITROPAN Take 1 tablet (5 mg total) by mouth 3 (three) times daily.   PROBIOTIC-10 PO Take by mouth.   simvastatin 10 MG tablet Commonly known as:  ZOCOR Take 1 tablet (10 mg total) by mouth at bedtime.   triamcinolone cream 0.5 % Commonly known as:  KENALOG Apply 1 application topically daily as needed.   Vitamin B-12 5000 MCG Lozg Take 1 tablet by mouth daily.   Vitamin D3 5000 units Caps Take 1 capsule by mouth daily.          Objective:    BP (!) 146/105   Pulse 95   Temp 98.9 F (37.2 C) (Oral)   Ht 5\' 3"  (1.6 m)   Wt 219 lb 3.2 oz (99.4 kg)   LMP 03/17/2012   BMI 38.83 kg/m   Allergies  Allergen Reactions  . Metformin And Related     Diarrhea with higher doses    Wt Readings from Last 3 Encounters:  01/30/18 219 lb 3.2 oz (99.4 kg)  11/19/17 215 lb 3.2 oz (97.6 kg)  05/15/17 191 lb 6.4 oz (86.8 kg)    Physical Exam  Constitutional: She is oriented to person, place, and time. She appears well-developed and well-nourished.  HENT:  Head: Normocephalic and atraumatic.  Eyes: Conjunctivae and EOM are normal. Pupils are equal, round, and reactive to light.  Cardiovascular: Normal rate, regular rhythm, normal heart sounds and intact distal pulses.  Pulmonary/Chest: Effort normal and breath sounds normal.  Abdominal: Soft. Bowel sounds are normal.  Neurological: She is alert and oriented to person, place, and time. She has normal reflexes.  Skin: Skin is warm and dry. No rash noted.  Psychiatric: She has a normal mood and affect. Her behavior is normal. Judgment  and thought content normal.  Nursing note and vitals reviewed.   Results for orders placed or performed during the hospital encounter of 03/31/17  CBG monitoring, ED  Result Value Ref Range   Glucose-Capillary 108 (H) 65 - 99 mg/dL      Assessment & Plan:   1. Essential hypertension  2. Gastroesophageal reflux disease without esophagitis  3. Multiple sclerosis ([place])   Continue all other maintenance medications as listed above.  Follow up plan: Recheck prn  Educational handout given for [place] PA-C [facility] [address]  [place], [place] [address] [phone]   01/30/2018, 3:13 PM

## 2018-03-04 ENCOUNTER — Other Ambulatory Visit: Payer: Self-pay | Admitting: Physician Assistant

## 2018-05-21 ENCOUNTER — Ambulatory Visit (HOSPITAL_COMMUNITY): Payer: Medicare HMO

## 2018-08-26 ENCOUNTER — Encounter (HOSPITAL_COMMUNITY): Payer: Self-pay

## 2018-09-24 DIAGNOSIS — J0100 Acute maxillary sinusitis, unspecified: Secondary | ICD-10-CM | POA: Diagnosis not present

## 2018-09-24 DIAGNOSIS — R05 Cough: Secondary | ICD-10-CM | POA: Diagnosis not present

## 2018-10-07 DIAGNOSIS — Z9049 Acquired absence of other specified parts of digestive tract: Secondary | ICD-10-CM | POA: Diagnosis not present

## 2018-10-07 DIAGNOSIS — R079 Chest pain, unspecified: Secondary | ICD-10-CM | POA: Diagnosis not present

## 2018-10-07 DIAGNOSIS — I10 Essential (primary) hypertension: Secondary | ICD-10-CM | POA: Diagnosis not present

## 2018-10-07 DIAGNOSIS — Z7982 Long term (current) use of aspirin: Secondary | ICD-10-CM | POA: Diagnosis not present

## 2018-10-07 DIAGNOSIS — R0789 Other chest pain: Secondary | ICD-10-CM | POA: Diagnosis not present

## 2018-10-07 DIAGNOSIS — Z79899 Other long term (current) drug therapy: Secondary | ICD-10-CM | POA: Diagnosis not present

## 2018-10-07 DIAGNOSIS — G35 Multiple sclerosis: Secondary | ICD-10-CM | POA: Diagnosis not present

## 2018-10-07 DIAGNOSIS — J069 Acute upper respiratory infection, unspecified: Secondary | ICD-10-CM | POA: Diagnosis not present

## 2018-10-07 DIAGNOSIS — R0602 Shortness of breath: Secondary | ICD-10-CM | POA: Diagnosis not present

## 2018-10-07 DIAGNOSIS — B373 Candidiasis of vulva and vagina: Secondary | ICD-10-CM | POA: Diagnosis not present

## 2018-10-16 DIAGNOSIS — R05 Cough: Secondary | ICD-10-CM | POA: Diagnosis not present

## 2018-10-16 DIAGNOSIS — J40 Bronchitis, not specified as acute or chronic: Secondary | ICD-10-CM | POA: Diagnosis not present

## 2018-10-16 DIAGNOSIS — J0100 Acute maxillary sinusitis, unspecified: Secondary | ICD-10-CM | POA: Diagnosis not present

## 2019-06-15 ENCOUNTER — Telehealth: Payer: Self-pay | Admitting: Physician Assistant

## 2019-06-18 ENCOUNTER — Ambulatory Visit: Payer: Medicare HMO

## 2019-07-19 ENCOUNTER — Emergency Department (HOSPITAL_COMMUNITY): Payer: Medicare HMO

## 2019-07-19 ENCOUNTER — Emergency Department (HOSPITAL_COMMUNITY)
Admission: EM | Admit: 2019-07-19 | Discharge: 2019-07-19 | Disposition: A | Discharge status: Home / Self Care | Payer: Medicare HMO | Attending: Emergency Medicine | Admitting: Emergency Medicine

## 2019-07-19 ENCOUNTER — Other Ambulatory Visit: Payer: Self-pay

## 2019-07-19 ENCOUNTER — Encounter (HOSPITAL_COMMUNITY): Payer: Self-pay

## 2019-07-19 DIAGNOSIS — I10 Essential (primary) hypertension: Secondary | ICD-10-CM | POA: Diagnosis not present

## 2019-07-19 DIAGNOSIS — R079 Chest pain, unspecified: Secondary | ICD-10-CM | POA: Diagnosis not present

## 2019-07-19 DIAGNOSIS — J45909 Unspecified asthma, uncomplicated: Secondary | ICD-10-CM | POA: Insufficient documentation

## 2019-07-19 DIAGNOSIS — B349 Viral infection, unspecified: Secondary | ICD-10-CM | POA: Diagnosis not present

## 2019-07-19 DIAGNOSIS — R05 Cough: Secondary | ICD-10-CM | POA: Diagnosis not present

## 2019-07-19 DIAGNOSIS — Z20828 Contact with and (suspected) exposure to other viral communicable diseases: Secondary | ICD-10-CM | POA: Insufficient documentation

## 2019-07-19 DIAGNOSIS — R112 Nausea with vomiting, unspecified: Secondary | ICD-10-CM | POA: Diagnosis not present

## 2019-07-19 DIAGNOSIS — R059 Cough, unspecified: Secondary | ICD-10-CM

## 2019-07-19 LAB — COMPREHENSIVE METABOLIC PANEL
ALT: 40 U/L (ref 0–44)
AST: 29 U/L (ref 15–41)
Albumin: 4.4 g/dL (ref 3.5–5.0)
Alkaline Phosphatase: 59 U/L (ref 38–126)
Anion gap: 8 (ref 5–15)
BUN: 6 mg/dL (ref 6–20)
CO2: 24 mmol/L (ref 22–32)
Calcium: 8.8 mg/dL — ABNORMAL LOW (ref 8.9–10.3)
Chloride: 106 mmol/L (ref 98–111)
Creatinine, Ser: 0.56 mg/dL (ref 0.44–1.00)
GFR calc Af Amer: >60 mL/min (ref >60)
GFR calc non Af Amer: >60 mL/min (ref >60)
Glucose, Bld: 102 mg/dL — ABNORMAL HIGH (ref 70–99)
Potassium: 3.5 mmol/L (ref 3.5–5.1)
Sodium: 138 mmol/L (ref 135–145)
Total Bilirubin: 0.8 mg/dL (ref 0.3–1.2)
Total Protein: 8.2 g/dL — ABNORMAL HIGH (ref 6.5–8.1)

## 2019-07-19 LAB — CBC
HCT: 43.7 % (ref 36.0–46.0)
Hemoglobin: 14.7 g/dL (ref 12.0–15.0)
MCH: 31.5 pg (ref 26.0–34.0)
MCHC: 33.6 g/dL (ref 30.0–36.0)
MCV: 93.6 fL (ref 80.0–100.0)
Platelets: 275 10*3/uL (ref 150–400)
RBC: 4.67 MIL/uL (ref 3.87–5.11)
RDW: 12.2 % (ref 11.5–15.5)
WBC: 6.8 10*3/uL (ref 4.0–10.5)
nRBC: 0 % (ref 0.0–0.2)

## 2019-07-19 LAB — URINALYSIS, ROUTINE W REFLEX MICROSCOPIC
Bilirubin Urine: NEGATIVE
Glucose, UA: NEGATIVE mg/dL
Ketones, ur: NEGATIVE mg/dL
Leukocytes,Ua: NEGATIVE
Nitrite: NEGATIVE
Protein, ur: NEGATIVE mg/dL
Specific Gravity, Urine: 1.02 (ref 1.005–1.030)
pH: 6 (ref 5.0–8.0)

## 2019-07-19 LAB — URINALYSIS, MICROSCOPIC (REFLEX)
Bacteria, UA: NONE SEEN
RBC / HPF: NONE SEEN RBC/hpf (ref 0–5)

## 2019-07-19 LAB — LIPASE, BLOOD: Lipase: 27 U/L (ref 11–51)

## 2019-07-19 MED ORDER — ONDANSETRON 4 MG PO TBDP: 4.0000 mg | ORAL_TABLET | Freq: Three times a day (TID) | ORAL | 0 refills | Status: DC | PRN

## 2019-07-19 MED ORDER — ONDANSETRON 4 MG PO TBDP
4.0000 mg | ORAL_TABLET | Freq: Once | ORAL | Status: AC
  Administered 2019-07-19: 16:00:00 4 mg via ORAL
  Filled 2019-07-19: qty 1

## 2019-07-19 NOTE — ED Triage Notes (Signed)
Pt states that she has been having coughing, nausea, and vomiting for several days. Pt states she has a hx of MS, and is unable to keep meds down.   Approximately 8 episodes of emesis in last 24 hours.  Pt also endorses some diarrhea, but that has mostly subsided.  Pt states productive cough with "clear chunks that are sticky"

## 2019-07-19 NOTE — Discharge Instructions (Addendum)
You were evaluated in the Emergency Department and after careful evaluation, we did not find any emergent condition requiring admission or further testing in the hospital.  Your symptoms today seem to be due to a viral illness, possibly the coronavirus.  Your testing today was overall reassuring.  Please use the antinausea medicine at home to help you take your normal daily medications.  We swabbed you for the coronavirus and test results should come back in about 2 days.  We recommend home quarantine while you wait and if positive we recommend continued quarantine per CDC recommendations.  Please return to the Emergency Department if you experience any worsening of your condition.  We encourage you to follow up with a primary care provider.  Thank you for allowing [place] to be a part of your care.

## 2019-07-19 NOTE — ED Provider Notes (Signed)
[facility] Emergency Department Provider Note MRN:  [id number]  Arrival date & time: 07/19/19     Chief Complaint   Cough and Emesis   History of Present Illness   [name] is a 43 y.o. year-old female with a history of gastroparesis, MS presenting to the ED with chief complaint of cough and emesis.  Patient has been feeling unwell for 3 days.  "Feels like crap".  Began with nausea, nonbloody nonbilious emesis, watery diarrhea.  Has progressed to cough, body aches.  Mild dull frontal headache, denies neck pain.  Endorsing some chest heaviness and shortness of breath when laying flat in bed.  Denies abdominal pain, no dysuria.  Explains that she has not been able to take her MS medications and starting to feel paresthesias to her arms and legs.  Patient used the airport to travel to [place] last week.  Review of Systems  A complete 10 system review of systems was obtained and all systems are negative except as noted in the HPI and PMH.   Patient's Health History    Past Medical History:  Diagnosis Date  . Anginal pain ([place])   . Asthma   . Gastroparesis   . Hypertension   . Migraine headache   . Multiple sclerosis (HCC)    Dr. Jannifer Franklin  . 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 Neurogenic bladder   . Neurogenic bowel   . Obesity   . Obesity   . PCOS (polycystic ovarian syndrome)   . Polyneuropathy   . Seizures ([place])     Past Surgical History:  Procedure Laterality Date  . ABDOMINAL HYSTERECTOMY    . CESAREAN SECTION    . CHOLECYSTECTOMY    . COLON SURGERY    . DILATION AND CURETTAGE OF UTERUS    . IUD REMOVAL  03/19/2012   denies  . LAPAROSCOPIC GASTRIC BANDING  01/26/08    Family History  Problem Relation Age of Onset  . Diabetes Mother   . Depression Mother   . Hypertension Mother   . Liver disease Father   . Depression Daughter        bipolar depression  . Coronary artery disease Other   . Cancer Other        ovarian   . Anesthesia problems Neg Hx     Social History   Socioeconomic History  . Marital status: Legally Separated    Spouse name: Corene Cornea  . Number of children: 2  . Years of education: BA  . Highest education level: Not on file  Occupational History  . Occupation: RESEARCH    Employer: [employer]  Social Needs  . Financial resource strain: Not on file  . Food insecurity    Worry: Not on file    Inability: Not on file  . Transportation needs    Medical: Not on file    Non-medical: Not on file  Tobacco Use  . Smoking status: Never Smoker  . Smokeless tobacco: Never Used  Substance and Sexual Activity  . Alcohol use: No  . Drug use: No  . Sexual activity: Not Currently    Birth control/protection: Surgical  Lifestyle  . Physical activity    Days per week: Not on file    Minutes per session: Not on file  . Stress: Not on file  Relationships  . Social Herbalist on phone: Not on file    Gets together: Not on file    Attends religious service:  Not on file    Active member of club or organization: Not on file    Attends meetings of clubs or organizations: Not on file    Relationship status: Not on file  . Intimate partner violence    Fear of current or ex partner: Not on file    Emotionally abused: Not on file    Physically abused: Not on file    Forced sexual activity: Not on file  Other Topics Concern  . Not on file  Social History Narrative   Patient lives at home with husband Corene Cornea.    Patient has 1 child and 1 step child.    Patient has a BA degree.            Physical Exam  Vital Signs and Nursing Notes reviewed Vitals:   07/19/19 1601 07/19/19 1700  BP: (!) 131/94 (!) 133/92  Pulse: 72 83  Resp: 16 (!) 21  Temp:    SpO2: 99% 99%    CONSTITUTIONAL: Well-appearing, NAD NEURO:  Alert and oriented x 3, no focal deficits EYES:  eyes equal and reactive ENT/NECK:  no LAD, no JVD CARDIO: Regular rate, well-perfused, normal S1 and S2 PULM:  CTAB  no wheezing or rhonchi GI/GU:  normal bowel sounds, non-distended, non-tender MSK/SPINE:  No gross deformities, no edema SKIN:  no rash, atraumatic PSYCH:  Appropriate speech and behavior  Diagnostic and Interventional Summary    EKG Interpretation  Date/Time:  Sunday July 19 2019 15:30:56 EDT Ventricular Rate:  83 PR Interval:    QRS Duration: 92 QT Interval:  387 QTC Calculation: 455 R Axis:   44 Text Interpretation:  Sinus rhythm Borderline T wave abnormalities Baseline wander in lead(s) II III aVF V1 Confirmed by Gerlene Fee [phone]) on 07/19/2019 4:22:34 PM      Labs Reviewed  COMPREHENSIVE METABOLIC PANEL - Abnormal; Notable for the following components:      Result Value   Glucose, Bld 102 (*)    Calcium 8.8 (*)    Total Protein 8.2 (*)    All other components within normal limits  URINALYSIS, ROUTINE W REFLEX MICROSCOPIC - Abnormal; Notable for the following components:   Hgb urine dipstick SMALL (*)    All other components within normal limits  NOVEL CORONAVIRUS, NAA (HOSPITAL ORDER, SEND-OUT TO REF LAB)  CBC  LIPASE, BLOOD  URINALYSIS, MICROSCOPIC (REFLEX)    DG Chest Port 1 View  Final Result      Medications  ondansetron (ZOFRAN-ODT) disintegrating tablet 4 mg (4 mg Oral Given 07/19/19 1556)     Procedures Critical Care  ED Course and Medical Decision Making  I have reviewed the triage vital signs and the nursing notes.  Pertinent labs & imaging results that were available during my care of the patient were reviewed by me and considered in my medical decision making (see below for details).  Suspect viral illness, possibly coronavirus in this well-appearing 43 year old female, history of MS, normal vital signs, no increased work of breathing, lungs clear, abdomen soft and nontender.  No objective neurological deficits on exam.  Plan for screening labs to exclude AKI, UTI, screening EKG and chest x-ray given her chest pressure when laying flat.  Doubt  PE, PERC negative.  Anticipating discharge with quarantine instructions.  Nausea medicine at home will be important to make sure that she can tolerate her MS meds.  Work-up unrevealing, appropriate for discharge.  After the discussed management above, the patient was determined to be safe for discharge.  The patient was in agreement with this plan and all questions regarding their care were answered.  ED return precautions were discussed and the patient will return to the ED with any significant worsening of condition.  Barth Kirks. Sedonia Small, MD [place] [email or web address]  Final Clinical Impressions(s) / ED Diagnoses     ICD-10-CM   1. Non-intractable vomiting with nausea, unspecified vomiting type  R11.2   2. Chest pain  R07.9 DG Chest [facility] 1 View    DG Chest Port 1 View  3. Cough  R05   4. Viral illness  B34.9     ED Discharge Orders         Ordered    ondansetron (ZOFRAN ODT) 4 MG disintegrating tablet  Every 8 hours PRN     07/19/19 1727             Maudie Flakes, MD 07/19/19 1729

## 2019-07-20 LAB — NOVEL CORONAVIRUS, NAA (HOSP ORDER, SEND-OUT TO REF LAB; TAT 18-24 HRS): SARS-CoV-2, NAA: NOT DETECTED

## 2019-08-04 ENCOUNTER — Other Ambulatory Visit: Payer: Self-pay | Admitting: Psychiatry

## 2019-08-04 DIAGNOSIS — G35 Multiple sclerosis: Secondary | ICD-10-CM

## 2019-09-02 ENCOUNTER — Ambulatory Visit
Admission: RE | Admit: 2019-09-02 | Discharge: 2019-09-02 | Disposition: A | Discharge status: Home / Self Care | Payer: Medicare HMO | Source: Ambulatory Visit | Attending: Psychiatry | Admitting: Psychiatry

## 2019-09-02 ENCOUNTER — Other Ambulatory Visit: Payer: Self-pay

## 2019-09-02 DIAGNOSIS — G35 Multiple sclerosis: Secondary | ICD-10-CM

## 2019-09-02 MED ORDER — GADOBENATE DIMEGLUMINE 529 MG/ML IV SOLN
20.0000 mL | Freq: Once | INTRAVENOUS | Status: AC | PRN
  Administered 2019-09-02: 20 mL via INTRAVENOUS

## 2019-10-07 ENCOUNTER — Telehealth (HOSPITAL_COMMUNITY): Payer: Self-pay | Admitting: General Practice

## 2019-10-07 NOTE — Telephone Encounter (Signed)
Provider's office called, wanted to know if patient has current orders for ocrevus.

## 2019-10-14 ENCOUNTER — Ambulatory Visit (HOSPITAL_COMMUNITY): Payer: Medicare HMO

## 2019-10-19 ENCOUNTER — Other Ambulatory Visit: Payer: Self-pay

## 2019-10-19 ENCOUNTER — Ambulatory Visit (HOSPITAL_COMMUNITY)
Admission: RE | Admit: 2019-10-19 | Discharge: 2019-10-19 | Disposition: A | Discharge status: Home / Self Care | Payer: Medicare HMO | Source: Ambulatory Visit | Attending: Internal Medicine | Admitting: Internal Medicine

## 2019-10-19 ENCOUNTER — Telehealth (HOSPITAL_COMMUNITY): Payer: Self-pay | Admitting: General Practice

## 2019-10-19 DIAGNOSIS — G35 Multiple sclerosis: Secondary | ICD-10-CM | POA: Diagnosis not present

## 2019-10-19 MED ORDER — SODIUM CHLORIDE 0.9 % IV SOLN
INTRAVENOUS | Status: DC | PRN
  Administered 2019-10-19: 250 mL via INTRAVENOUS

## 2019-10-19 MED ORDER — SODIUM CHLORIDE 0.9 % IV SOLN
300.0000 mg | Freq: Once | INTRAVENOUS | Status: AC
  Administered 2019-10-19: 300 mg via INTRAVENOUS
  Filled 2019-10-19: qty 10

## 2019-10-19 MED ORDER — DIPHENHYDRAMINE HCL 50 MG/ML IJ SOLN
25.0000 mg | Freq: Once | INTRAMUSCULAR | Status: DC
  Filled 2019-10-19: qty 1

## 2019-10-19 MED ORDER — ACETAMINOPHEN 325 MG PO TABS
975.0000 mg | ORAL_TABLET | Freq: Once | ORAL | Status: AC
  Administered 2019-10-19: 975 mg via ORAL
  Filled 2019-10-19: qty 3

## 2019-10-19 MED ORDER — DIPHENHYDRAMINE HCL 25 MG PO CAPS
25.0000 mg | ORAL_CAPSULE | Freq: Once | ORAL | Status: AC
  Administered 2019-10-19: 25 mg via ORAL
  Filled 2019-10-19: qty 1

## 2019-10-19 MED ORDER — METHYLPREDNISOLONE SODIUM SUCC 125 MG IJ SOLR
100.0000 mg | Freq: Once | INTRAMUSCULAR | Status: AC
  Administered 2019-10-19: 100 mg via INTRAVENOUS
  Filled 2019-10-19: qty 2

## 2019-10-19 NOTE — Progress Notes (Addendum)
Patient received Ocrevus via PIV. Pre infusion medications - PO Benadryl, PO Tylenol and IV Solumedrol were given. Pre infusion BP was 146/100. Tyrone Sage MD was contacted. Per MD, RN should proceed with infusion. Again, per MD order, patient received additional 25 mg of PO Benadryl for reactions. Post infusion BP was 135/90. Patient was able to tolerate maximum 90 cc/ hour infusion rate. Tolerated well, vitals stable, discharge instructions given, verbalized understanding. Patient alert, oriented and transported in a wheelchair at the time of discharge.

## 2019-10-19 NOTE — Discharge Instructions (Signed)
Ocrelizumab injection °What is this medicine? °OCRELIZUMAB (ok re LIZ ue mab) treats multiple sclerosis. It helps to decrease the number of multiple sclerosis relapses. It is not a cure. °This medicine may be used for other purposes; ask your health care provider or pharmacist if you have questions. °COMMON BRAND NAME(S): OCREVUS °What should I tell my health care provider before I take this medicine? °They need to know if you have any of these conditions: °· cancer °· hepatitis B infection °· other infection (especially a virus infection such as chickenpox, cold sores, or herpes) °· an unusual or allergic reaction to ocrelizumab, other medicines, foods, dyes or preservatives °· pregnant or trying to get pregnant °· breast-feeding °How should I use this medicine? °This medicine is for infusion into a vein. It is given by a health care professional in a hospital or clinic setting. °Talk to your pediatrician regarding the use of this medicine in children. Special care may be needed. °Overdosage: If you think you have taken too much of this medicine contact a poison control center or emergency room at once. °NOTE: This medicine is only for you. Do not share this medicine with others. °What if I miss a dose? °Keep appointments for follow-up doses as directed. It is important not to miss your dose. Call your doctor or health care professional if you are unable to keep an appointment. °What may interact with this medicine? °· alemtuzumab °· daclizumab °· dimethyl fumarate °· fingolimod °· glatiramer °· interferon beta °· live virus vaccines °· mitoxantrone °· natalizumab °· peginterferon beta °· rituximab °· steroid medicines like prednisone or cortisone °· teriflunomide °This list may not describe all possible interactions. Give your health care provider a list of all the medicines, herbs, non-prescription drugs, or dietary supplements you use. Also tell them if you smoke, drink alcohol, or use illegal drugs. Some items  may interact with your medicine. °What should I watch for while using this medicine? °Tell your doctor or healthcare professional if your symptoms do not start to get better or if they get worse. °This medicine can cause serious allergic reactions. To reduce your risk you may need to take medicine before treatment with this medicine. Take your medicine as directed. °Women should inform their doctor if they wish to become pregnant or think they might be pregnant. There is a potential for serious side effects to an unborn child. Talk to your health care professional or pharmacist for more information. Female patients should use effective birth control methods while receiving this medicine and for 6 months after the last dose. °Call your doctor or health care professional for advice if you get a fever, chills or sore throat, or other symptoms of a cold or flu. Do not treat yourself. This drug decreases your body's ability to fight infections. Try to avoid being around people who are sick. °If you have a hepatitis B infection or a history of a hepatitis B infection, talk to your doctor. The symptoms of hepatitis B may get worse if you take this medicine. °In some patients, this medicine may cause a serious brain infection that may cause death. If you have any problems seeing, thinking, speaking, walking, or standing, tell your doctor right away. If you cannot reach your doctor, urgently seek other source of medical care. °This medicine can decrease the response to a vaccine. If you need to get vaccinated, tell your healthcare professional if you have received this medicine. Extra booster doses may be needed. Talk to your   doctor to see if a different vaccination schedule is needed. °Talk to your doctor about your risk of cancer. You may be more at risk for certain types of cancers if you take this medicine. °What side effects may I notice from receiving this medicine? °Side effects that you should report to your doctor  or health care professional as soon as possible: °· allergic reactions like skin rash, itching or hives, swelling of the face, lips, or tongue °· breathing problems °· facial flushing °· fast, irregular heartbeat °· lump or soreness in the breast °· signs and symptoms of herpes such as cold sore, shingles, or genital sores °· signs and symptoms of infection like fever or chills, cough, sore throat, pain or trouble passing urine °· signs and symptoms of low blood pressure like dizziness; feeling faint or lightheaded, falls; unusually weak or tired °· signs and symptoms of progressive multifocal leukoencephalopathy (PML) like changes in vision; clumsiness; confusion; personality changes; weakness on one side of the body °· swelling of the ankles, feet, hands °Side effects that usually do not require medical attention (report these to your doctor or health care professional if they continue or are bothersome): °· back pain °· depressed mood °· diarrhea °· pain, redness, or irritation at site where injected °This list may not describe all possible side effects. Call your doctor for medical advice about side effects. You may report side effects to FDA at 1-800-FDA-1088. °Where should I keep my medicine? °This drug is given in a hospital or clinic and will not be stored at home. °NOTE: This sheet is a summary. It may not cover all possible information. If you have questions about this medicine, talk to your doctor, pharmacist, or health care provider. °© 2020 [employer] (2016-03-06 09:40:25) ° °

## 2019-11-03 ENCOUNTER — Other Ambulatory Visit: Payer: Self-pay

## 2019-11-03 ENCOUNTER — Ambulatory Visit (HOSPITAL_COMMUNITY)
Admission: RE | Admit: 2019-11-03 | Discharge: 2019-11-03 | Disposition: A | Discharge status: Home / Self Care | Payer: Medicare HMO | Source: Ambulatory Visit | Attending: Internal Medicine | Admitting: Internal Medicine

## 2019-11-03 DIAGNOSIS — G35 Multiple sclerosis: Secondary | ICD-10-CM | POA: Insufficient documentation

## 2019-11-03 MED ORDER — SODIUM CHLORIDE 0.9 % IV SOLN
INTRAVENOUS | Status: DC | PRN
  Administered 2019-11-03: 250 mL via INTRAVENOUS

## 2019-11-03 MED ORDER — DIPHENHYDRAMINE HCL 25 MG PO CAPS
25.0000 mg | ORAL_CAPSULE | Freq: Once | ORAL | Status: AC
  Administered 2019-11-03: 25 mg via ORAL
  Filled 2019-11-03: qty 1

## 2019-11-03 MED ORDER — SODIUM CHLORIDE 0.9 % IV SOLN
300.0000 mg | Freq: Once | INTRAVENOUS | Status: AC
  Administered 2019-11-03: 300 mg via INTRAVENOUS
  Filled 2019-11-03: qty 10

## 2019-11-03 MED ORDER — ACETAMINOPHEN 500 MG PO TABS
1000.0000 mg | ORAL_TABLET | Freq: Once | ORAL | Status: AC
  Administered 2019-11-03: 1000 mg via ORAL
  Filled 2019-11-03: qty 2

## 2019-11-03 MED ORDER — METHYLPREDNISOLONE SODIUM SUCC 125 MG IJ SOLR
100.0000 mg | Freq: Once | INTRAMUSCULAR | Status: AC
  Administered 2019-11-03: 100 mg via INTRAVENOUS
  Filled 2019-11-03: qty 2

## 2019-11-03 NOTE — Progress Notes (Signed)
Patient received IV Ocrevus. Pre infusion medications - PO Benadryl, PO Tylenol and IV Solumedrol were given. Per MD order, patient given additional 25 mg of PO Benadryl for itching. Patient was able to tolerate maximum 90 cc/ hour infusion rate. Tolerated well, vitals stable, discharge instructions given, verbalized understanding. Patient alert, oriented and ambulatory at the time of discharge.

## 2019-11-03 NOTE — Discharge Instructions (Signed)
Ocrelizumab injection What is this medicine? OCRELIZUMAB (ok re LIZ ue mab) treats multiple sclerosis. It helps to decrease the number of multiple sclerosis relapses. It is not a cure. This medicine may be used for other purposes; ask your health care provider or pharmacist if you have questions. COMMON BRAND NAME(S): OCREVUS What should I tell my health care provider before I take this medicine? They need to know if you have any of these conditions:  cancer  hepatitis B infection  other infection (especially a virus infection such as chickenpox, cold sores, or herpes)  an unusual or allergic reaction to ocrelizumab, other medicines, foods, dyes or preservatives  pregnant or trying to get pregnant  breast-feeding How should I use this medicine? This medicine is for infusion into a vein. It is given by a health care professional in a hospital or clinic setting. Talk to your pediatrician regarding the use of this medicine in children. Special care may be needed. Overdosage: If you think you have taken too much of this medicine contact a poison control center or emergency room at once. NOTE: This medicine is only for you. Do not share this medicine with others. What if I miss a dose? Keep appointments for follow-up doses as directed. It is important not to miss your dose. Call your doctor or health care professional if you are unable to keep an appointment. What may interact with this medicine?  alemtuzumab  daclizumab  dimethyl fumarate  fingolimod  glatiramer  interferon beta  live virus vaccines  mitoxantrone  natalizumab  peginterferon beta  rituximab  steroid medicines like prednisone or cortisone  teriflunomide This list may not describe all possible interactions. Give your health care provider a list of all the medicines, herbs, non-prescription drugs, or dietary supplements you use. Also tell them if you smoke, drink alcohol, or use illegal drugs. Some items  may interact with your medicine. What should I watch for while using this medicine? Tell your doctor or healthcare professional if your symptoms do not start to get better or if they get worse. This medicine can cause serious allergic reactions. To reduce your risk you may need to take medicine before treatment with this medicine. Take your medicine as directed. Women should inform their doctor if they wish to become pregnant or think they might be pregnant. There is a potential for serious side effects to an unborn child. Talk to your health care professional or pharmacist for more information. Female patients should use effective birth control methods while receiving this medicine and for 6 months after the last dose. Call your doctor or health care professional for advice if you get a fever, chills or sore throat, or other symptoms of a cold or flu. Do not treat yourself. This drug decreases your body's ability to fight infections. Try to avoid being around people who are sick. If you have a hepatitis B infection or a history of a hepatitis B infection, talk to your doctor. The symptoms of hepatitis B may get worse if you take this medicine. In some patients, this medicine may cause a serious brain infection that may cause death. If you have any problems seeing, thinking, speaking, walking, or standing, tell your doctor right away. If you cannot reach your doctor, urgently seek other source of medical care. This medicine can decrease the response to a vaccine. If you need to get vaccinated, tell your healthcare professional if you have received this medicine. Extra booster doses may be needed. Talk to your  doctor to see if a different vaccination schedule is needed. Talk to your doctor about your risk of cancer. You may be more at risk for certain types of cancers if you take this medicine. What side effects may I notice from receiving this medicine? Side effects that you should report to your doctor  or health care professional as soon as possible:  allergic reactions like skin rash, itching or hives, swelling of the face, lips, or tongue  breathing problems  facial flushing  fast, irregular heartbeat  lump or soreness in the breast  signs and symptoms of herpes such as cold sore, shingles, or genital sores  signs and symptoms of infection like fever or chills, cough, sore throat, pain or trouble passing urine  signs and symptoms of low blood pressure like dizziness; feeling faint or lightheaded, falls; unusually weak or tired  signs and symptoms of progressive multifocal leukoencephalopathy (PML) like changes in vision; clumsiness; confusion; personality changes; weakness on one side of the body  swelling of the ankles, feet, hands Side effects that usually do not require medical attention (report these to your doctor or health care professional if they continue or are bothersome):  back pain  depressed mood  diarrhea  pain, redness, or irritation at site where injected This list may not describe all possible side effects. Call your doctor for medical advice about side effects. You may report side effects to FDA at 1-800-FDA-1088. Where should I keep my medicine? This drug is given in a hospital or clinic and will not be stored at home. NOTE: This sheet is a summary. It may not cover all possible information. If you have questions about this medicine, talk to your doctor, pharmacist, or health care provider.  2020 Elsevier/Gold Standard (2016-03-06 09:40:25)

## 2020-05-11 ENCOUNTER — Other Ambulatory Visit: Payer: Self-pay

## 2020-05-11 ENCOUNTER — Ambulatory Visit (HOSPITAL_COMMUNITY)
Admission: RE | Admit: 2020-05-11 | Discharge: 2020-05-11 | Disposition: A | Discharge status: Home / Self Care | Payer: Medicare HMO | Source: Ambulatory Visit | Attending: Internal Medicine | Admitting: Internal Medicine

## 2020-05-11 DIAGNOSIS — G35 Multiple sclerosis: Secondary | ICD-10-CM | POA: Diagnosis not present

## 2020-05-11 MED ORDER — ACETAMINOPHEN 325 MG PO TABS
975.0000 mg | ORAL_TABLET | Freq: Once | ORAL | Status: AC
  Administered 2020-05-11: 975 mg via ORAL
  Filled 2020-05-11: qty 3

## 2020-05-11 MED ORDER — SODIUM CHLORIDE 0.9 % IV SOLN
8.0000 mg | Freq: Once | INTRAVENOUS | Status: AC
  Administered 2020-05-11: 8 mg via INTRAVENOUS
  Filled 2020-05-11: qty 4

## 2020-05-11 MED ORDER — METHYLPREDNISOLONE SODIUM SUCC 125 MG IJ SOLR
100.0000 mg | Freq: Once | INTRAMUSCULAR | Status: AC
  Administered 2020-05-11: 100 mg via INTRAVENOUS
  Filled 2020-05-11: qty 2

## 2020-05-11 MED ORDER — DIPHENHYDRAMINE HCL 50 MG/ML IJ SOLN
25.0000 mg | Freq: Once | INTRAMUSCULAR | Status: AC
  Administered 2020-05-11: 25 mg via INTRAVENOUS
  Filled 2020-05-11: qty 1

## 2020-05-11 MED ORDER — SODIUM CHLORIDE 0.9 % IV SOLN
INTRAVENOUS | Status: DC | PRN
  Administered 2020-05-11: 250 mL via INTRAVENOUS

## 2020-05-11 MED ORDER — SODIUM CHLORIDE 0.9 % IV SOLN
600.0000 mg | Freq: Once | INTRAVENOUS | Status: AC
  Administered 2020-05-11: 600 mg via INTRAVENOUS
  Filled 2020-05-11: qty 20

## 2020-05-11 NOTE — [facility] Notes (Signed)
PATIENT CARE CENTER NOTE  Diagnosis: Multiple Sclerosis    Provider: Effie Shy, MD   Procedure: Ocrevus 600 mg infusion   Note: Patient received Ocrevus infusion via PIV. Pre-medication (Solu-medrol, IV Benadryl, Tylenol and Zofran) given per order. Infusion titrated per protocol. Half way through infusion patient reported itching in throat and ears. Infusion paused and an additional 25 mg Benadryl IV given. Itching resolved and infusion restarted. Patient tolerated remainder of infusion well with no adverse reaction. Patient declined to wait for 1 hour post-infusion. Vital signs stable. Discharge instructions given. Patient alert, oriented and ambulatory at discharge.

## 2020-05-11 NOTE — Discharge Instructions (Signed)
Ocrelizumab injection °What is this medicine? °OCRELIZUMAB (ok re LIZ ue mab) treats multiple sclerosis. It helps to decrease the number of multiple sclerosis relapses. It is not a cure. °This medicine may be used for other purposes; ask your health care provider or pharmacist if you have questions. °COMMON BRAND NAME(S): OCREVUS °What should I tell my health care provider before I take this medicine? °They need to know if you have any of these conditions: °· cancer °· hepatitis B infection °· other infection (especially a virus infection such as chickenpox, cold sores, or herpes) °· an unusual or allergic reaction to ocrelizumab, other medicines, foods, dyes or preservatives °· pregnant or trying to get pregnant °· breast-feeding °How should I use this medicine? °This medicine is for infusion into a vein. It is given by a health care professional in a hospital or clinic setting. °A special MedGuide will be given to you before each treatment. Be sure to read this information carefully each time. °Talk to your pediatrician regarding the use of this medicine in children. Special care may be needed. °Overdosage: If you think you have taken too much of this medicine contact a poison control center or emergency room at once. °NOTE: This medicine is only for you. Do not share this medicine with others. °What if I miss a dose? °Keep appointments for follow-up doses as directed. It is important not to miss your dose. Call your doctor or health care professional if you are unable to keep an appointment. °What may interact with this medicine? °· alemtuzumab °· daclizumab °· dimethyl fumarate °· fingolimod °· glatiramer °· interferon beta °· live virus vaccines °· mitoxantrone °· natalizumab °· peginterferon beta °· rituximab °· steroid medicines like prednisone or cortisone °· teriflunomide °This list may not describe all possible interactions. Give your health care provider a list of all the medicines, herbs,  non-prescription drugs, or dietary supplements you use. Also tell them if you smoke, drink alcohol, or use illegal drugs. Some items may interact with your medicine. °What should I watch for while using this medicine? °Tell your doctor or healthcare professional if your symptoms do not start to get better or if they get worse. °This medicine can cause serious allergic reactions. To reduce your risk you may need to take medicine before treatment with this medicine. Take your medicine as directed. °Women should inform their doctor if they wish to become pregnant or think they might be pregnant. There is a potential for serious side effects to an unborn child. Talk to your health care professional or pharmacist for more information. Female patients should use effective birth control methods while receiving this medicine and for 6 months after the last dose. °Call your doctor or health care professional for advice if you get a fever, chills or sore throat, or other symptoms of a cold or flu. Do not treat yourself. This drug decreases your body's ability to fight infections. Try to avoid being around people who are sick. °If you have a hepatitis B infection or a history of a hepatitis B infection, talk to your doctor. The symptoms of hepatitis B may get worse if you take this medicine. °In some patients, this medicine may cause a serious brain infection that may cause death. If you have any problems seeing, thinking, speaking, walking, or standing, tell your doctor right away. If you cannot reach your doctor, urgently seek other source of medical care. °This medicine can decrease the response to a vaccine. If you need to get   vaccinated, tell your healthcare professional if you have received this medicine. Extra booster doses may be needed. Talk to your doctor to see if a different vaccination schedule is needed. °Talk to your doctor about your risk of cancer. You may be more at risk for certain types of cancers if you  take this medicine. °What side effects may I notice from receiving this medicine? °Side effects that you should report to your doctor or health care professional as soon as possible: °· allergic reactions like skin rash, itching or hives, swelling of the face, lips, or tongue °· breathing problems °· facial flushing °· fast, irregular heartbeat °· lump or soreness in the breast °· signs and symptoms of herpes such as cold sore, shingles, or genital sores °· signs and symptoms of infection like fever or chills, cough, sore throat, pain or trouble passing urine °· signs and symptoms of low blood pressure like dizziness; feeling faint or lightheaded, falls; unusually weak or tired °· signs and symptoms of progressive multifocal leukoencephalopathy (PML) like changes in vision; clumsiness; confusion; personality changes; weakness on one side of the body °· swelling of the ankles, feet, hands °Side effects that usually do not require medical attention (report these to your doctor or health care professional if they continue or are bothersome): °· back pain °· depressed mood °· diarrhea °· pain, redness, or irritation at site where injected °This list may not describe all possible side effects. Call your doctor for medical advice about side effects. You may report side effects to FDA at 1-800-FDA-1088. °Where should I keep my medicine? °This drug is given in a hospital or clinic and will not be stored at home. °NOTE: This sheet is a summary. It may not cover all possible information. If you have questions about this medicine, talk to your doctor, pharmacist, or health care provider. °© 2020 Elsevier/Gold Standard (2018-11-24 07:41:53) ° °

## 2020-08-15 ENCOUNTER — Telehealth: Payer: Self-pay | Admitting: Internal Medicine

## 2020-08-15 NOTE — Telephone Encounter (Signed)
Patient called states she is having diarrhea and vomiting abdominal cramping along with every meal

## 2020-08-15 NOTE — Telephone Encounter (Signed)
Pt states she has been having issues with diarrhea, nausea and vomiting and abd cramping for about 2 weeks every time she eats. PCP referred her to GI, pt was seen by Dr. Henrene Pastor years ago. Scheduled pt to see Dr. Henrene Pastor tomorrow at 2pm. Pt aware of appt.

## 2020-08-16 ENCOUNTER — Encounter: Payer: Self-pay | Admitting: Internal Medicine

## 2020-08-16 ENCOUNTER — Other Ambulatory Visit: Payer: Medicare HMO

## 2020-08-16 ENCOUNTER — Ambulatory Visit (INDEPENDENT_AMBULATORY_CARE_PROVIDER_SITE_OTHER): Payer: Medicare HMO | Admitting: Internal Medicine

## 2020-08-16 VITALS — BP 112/78 | HR 93 | Ht 63.0 in | Wt 212.0 lb

## 2020-08-16 DIAGNOSIS — Z9884 Bariatric surgery status: Secondary | ICD-10-CM

## 2020-08-16 DIAGNOSIS — R197 Diarrhea, unspecified: Secondary | ICD-10-CM | POA: Diagnosis not present

## 2020-08-16 DIAGNOSIS — R109 Unspecified abdominal pain: Secondary | ICD-10-CM | POA: Diagnosis not present

## 2020-08-16 DIAGNOSIS — R7989 Other specified abnormal findings of blood chemistry: Secondary | ICD-10-CM

## 2020-08-16 DIAGNOSIS — K219 Gastro-esophageal reflux disease without esophagitis: Secondary | ICD-10-CM

## 2020-08-16 DIAGNOSIS — R945 Abnormal results of liver function studies: Secondary | ICD-10-CM

## 2020-08-16 DIAGNOSIS — R112 Nausea with vomiting, unspecified: Secondary | ICD-10-CM | POA: Diagnosis not present

## 2020-08-16 MED ORDER — DICYCLOMINE HCL 20 MG PO TABS: ORAL_TABLET | ORAL | 0 refills | Status: DC

## 2020-08-16 NOTE — [facility] Notes (Signed)
HISTORY OF PRESENT ILLNESS:  [name] is a pleasant 44 y.o. female, remote working Therapist, art, who is sent today by her primary care provider regarding abdominal pain with nausea vomiting and diarrhea.  Also, elevated liver tests.  Patient was seen in this office many years ago.  No old records available.  She has a history of MS which is under good control with medical therapy.  She tells me that she was diagnosed with gastroparesis in the past.  She also has a history of remote lap band procedure for obesity.  She tells me that her lap band has had all of the fluid removed.  In any event, she states that she was in her usual state of health until approximately 1 month ago.  At that time she developed problems with cramping abdominal pain, nausea, vomiting, and diarrhea.  Associated diaphoresis.  Symptoms are exacerbated by meals but may occur throughout the night after falling asleep.  She describes the diarrhea as bile colored and burning.  She feels that her symptoms have worsened over time.  She has lost 10 pounds.  She is status post cholecystectomy.  Review of blood work from July 19, 2019 shows normal liver tests, lipase, and CBC with hemoglobin 14.7.  Laboratories from March 2021 revealed a minimally elevated ALT.  Recent tests (July 22, 2020), at the time of her illness, were fairly unremarkable.  Her tests were normal except for ALT of 52.  CBC revealed hemoglobin 14.2.  Hemoglobin A1c 5.9.  Hepatitis panel was negative.  There has been no GI bleeding.  No imaging or stool studies.  She does not use alcohol.  She does have occasional GERD symptoms with dietary indiscretion for which she uses Pepcid on demand.  She has completed her Covid vaccination series  REVIEW OF SYSTEMS:  All non-GI ROS negative unless otherwise stated in the HPI except for headaches  Past Medical History:  Diagnosis Date  . Anginal pain ([place])   . Asthma   . Gastroparesis   . Hypertension   . Migraine headache    . 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 Neurogenic bladder   . Neurogenic bowel   . Obesity   . PCOS (polycystic ovarian syndrome)   . Polyneuropathy   . Seizures ([place])   . Skin cancer (melanoma) ([facility])    scalp  . Uterine cancer [facility])     Past Surgical History:  Procedure Laterality Date  . ABDOMINAL HYSTERECTOMY    . BARIATRIC SURGERY     lapband  . CESAREAN SECTION    . CHOLECYSTECTOMY    . COLON SURGERY    . DILATION AND CURETTAGE OF UTERUS    . IUD REMOVAL  03/19/2012   denies  . LAPAROSCOPIC GASTRIC BANDING  01/26/08  . SKIN CANCER EXCISION     scalp and radiation    Social History [name]  reports that she has never smoked. She has never used smokeless tobacco. She reports that she does not drink alcohol. No history on file for drug use.  family history includes Alzheimer's disease in her mother; Cancer in an other family member; Colon cancer in her maternal uncle; Colon polyps in her brother; Coronary artery disease in an other family member; Depression in her daughter and mother; Diabetes in her mother; Hyperlipidemia in her mother; Hypertension in her mother; Liver disease in her father; Uterine cancer in her maternal grandmother.  Allergies  Allergen Reactions  . Toradol [Ketorolac Tromethamine] Other (See Comments)  Causes SEIZURES  . Metformin And Related     Diarrhea with higher doses       PHYSICAL EXAMINATION: Vital signs: BP 112/78   Pulse 93   Ht 5\' 3"  (1.6 m)   Wt 212 lb (96.2 kg)   LMP 03/17/2012   BMI 37.55 kg/m   Constitutional: generally well-appearing, no acute distress Psychiatric: alert and oriented x3, cooperative Eyes: extraocular movements intact, anicteric, conjunctiva pink Mouth: oral pharynx moist, no lesions Neck: supple no lymphadenopathy Cardiovascular: heart regular rate and rhythm, no murmur Lungs: clear to auscultation bilaterally Abdomen: soft, mildly tender on palpation in the midportion,  nondistended, no obvious ascites, no peritoneal signs, normal bowel sounds, no organomegaly Rectal: Omitted Extremities: no clubbing, cyanosis, or lower extremity edema bilaterally Skin: no lesions on visible extremities Neuro: No focal deficits.  Cranial nerves intact.  ASSESSMENT:  1.  1 month illness manifested by abdominal cramping, nausea with vomiting, and diarrhea.  Suspect acute gastroenteritis, likely viral. 2.  Mild elevation of ALT.  Most likely fatty liver.  Negative hepatitis serologies 3.  Status post lap band procedure remotely 4.  Status post cholecystectomy 5.  Morbid obesity 6.  MS.  Under control with medical therapy 7.  GERD.  Occasional symptoms with dietary indiscretion.  Not on regular therapy  PLAN:  1.  Stool studies for enteric pathogens 2.  Prescribe Bentyl 20 mg before meals and every 6 hours as needed for cramping abdominal pain.  Medication effects and risks reviewed 3.  Schedule upper endoscopy to evaluate abdominal pain patient with prior lap band placement.The nature of the procedure, as well as the risks, benefits, and alternatives were carefully and thoroughly reviewed with the patient. Ample time for discussion and questions allowed. The patient understood, was satisfied, and agreed to proceed. 4.  Exercise weight loss 5.  Further recommendations completed. Total time of 60 minutes was spent preparing to see the patient, reviewing outside testing laboratories, obtaining comprehensive history and performing medically appropriate physical examination.  Also, counseling the patient regarding her above listed issues, ordering advanced laboratories, medications, and endoscopic procedures.  Finally, documenting clinical information in the health record

## 2020-08-16 NOTE — Patient Instructions (Addendum)
Your provider has requested that you go to the basement level for lab work before leaving today. Press "B" on the elevator. The lab is located at the first door on the left as you exit the elevator.  We have sent the following medications to your pharmacy for you to pick up at your convenience:  Bentyl  You have been scheduled for an endoscopy. Please follow written instructions given to you at your visit today. If you use inhalers (even only as needed), please bring them with you on the day of your procedure.

## 2020-08-16 NOTE — Telephone Encounter (Signed)
Added to schedule.

## 2020-08-17 ENCOUNTER — Other Ambulatory Visit: Payer: Self-pay

## 2020-08-17 ENCOUNTER — Ambulatory Visit (AMBULATORY_SURGERY_CENTER): Payer: Medicare HMO | Admitting: Internal Medicine

## 2020-08-17 ENCOUNTER — Encounter: Payer: Self-pay | Admitting: Internal Medicine

## 2020-08-17 ENCOUNTER — Other Ambulatory Visit: Payer: Medicare HMO

## 2020-08-17 VITALS — BP 111/75 | HR 86 | Temp 97.3°F | Resp 15 | Ht 63.0 in | Wt 212.0 lb

## 2020-08-17 DIAGNOSIS — R109 Unspecified abdominal pain: Secondary | ICD-10-CM | POA: Diagnosis not present

## 2020-08-17 DIAGNOSIS — R197 Diarrhea, unspecified: Secondary | ICD-10-CM

## 2020-08-17 DIAGNOSIS — R112 Nausea with vomiting, unspecified: Secondary | ICD-10-CM

## 2020-08-17 DIAGNOSIS — K219 Gastro-esophageal reflux disease without esophagitis: Secondary | ICD-10-CM

## 2020-08-17 MED ORDER — SODIUM CHLORIDE 0.9 % IV SOLN: 500.0000 mL | Freq: Once | INTRAVENOUS | Status: DC

## 2020-08-17 NOTE — Patient Instructions (Signed)
YOU HAD AN ENDOSCOPIC PROCEDURE TODAY AT [facility]:   Refer to the procedure report that was given to you for any specific questions about what was found during the examination.  If the procedure report does not answer your questions, please call your gastroenterologist to clarify.  If you requested that your care partner not be given the details of your procedure findings, then the procedure report has been included in a sealed envelope for you to review at your convenience later.  YOU SHOULD EXPECT: Some feelings of bloating in the abdomen. Passage of more gas than usual.  Walking can help get rid of the air that was put into your GI tract during the procedure and reduce the bloating. If you had a lower endoscopy (such as a colonoscopy or flexible sigmoidoscopy) you may notice spotting of blood in your stool or on the toilet paper. If you underwent a bowel prep for your procedure, you may not have a normal bowel movement for a few days.  Please Note:  You might notice some irritation and congestion in your nose or some drainage.  This is from the oxygen used during your procedure.  There is no need for concern and it should clear up in a day or so.  SYMPTOMS TO REPORT IMMEDIATELY:     Following upper endoscopy (EGD)  Vomiting of blood or coffee ground material  New chest pain or pain under the shoulder blades  Painful or persistently difficult swallowing  New shortness of breath  Fever of 100F or higher  Black, tarry-looking stools  For urgent or emergent issues, a gastroenterologist can be reached at any hour by calling [phone]. Do not use MyChart messaging for urgent concerns.    DIET:  We do recommend a small meal at first, but then you may proceed to your regular diet.  Drink plenty of fluids but you should avoid alcoholic beverages for 24 hours.  ACTIVITY:  You should plan to take it easy for the rest of today and you should NOT DRIVE or use heavy machinery  until tomorrow (because of the sedation medicines used during the test).    FOLLOW UP: Our staff will call the number listed on your records 48-72 hours following your procedure to check on you and address any questions or concerns that you may have regarding the information given to you following your procedure. If we do not reach you, we will leave a message.  We will attempt to reach you two times.  During this call, we will ask if you have developed any symptoms of COVID 19. If you develop any symptoms (ie: fever, flu-like symptoms, shortness of breath, cough etc.) before then, please call [phone].  If you test positive for Covid 19 in the 2 weeks post procedure, please call and report this information to [place].    If any biopsies were taken you will be contacted by phone or by letter within the next 1-3 weeks.  Please call us at [phone] if you have not heard about the biopsies in 3 weeks.    SIGNATURES/CONFIDENTIALITY: You and/or your care partner have signed paperwork which will be entered into your electronic medical record.  These signatures attest to the fact that that the information above on your After Visit Summary has been reviewed and is understood.  Full responsibility of the confidentiality of this discharge information lies with you and/or your care-partner.   Resume medications. Follow up in office in 4-6 weeks.

## 2020-08-17 NOTE — [facility] Notes (Signed)
Report to PACU, RN, vss, BBS= Clear.  

## 2020-08-17 NOTE — Op Note (Signed)
[facility] Patient Name: [name] Procedure Date: 08/17/2020 10:04 AM MRN: [id number] Endoscopist: Docia Chuck. Henrene Pastor , MD Age: 44 Referring MD:  Date of Birth: Nov 06, 1976 Gender: Female Account #: [id number] Procedure:                Upper GI endoscopy Indications:              Abdominal pain, Diarrhea, Nausea with vomiting Medicines:                Monitored Anesthesia Care Procedure:                Pre-Anesthesia Assessment:                           - Prior to the procedure, a History and Physical                            was performed, and patient medications and                            allergies were reviewed. The patient's tolerance of                            previous anesthesia was also reviewed. The risks                            and benefits of the procedure and the sedation                            options and risks were discussed with the patient.                            All questions were answered, and informed consent                            was obtained. Prior Anticoagulants: The patient has                            taken no previous anticoagulant or antiplatelet                            agents. ASA Grade Assessment: II - A patient with                            mild systemic disease. After reviewing the risks                            and benefits, the patient was deemed in                            satisfactory condition to undergo the procedure.                           After obtaining informed consent, the endoscope was  passed under direct vision. Throughout the                            procedure, the patient's blood pressure, pulse, and                            oxygen saturations were monitored continuously. The                            Endoscope was introduced through the mouth, and                            advanced to the second part of duodenum. The upper                            GI  endoscopy was accomplished without difficulty.                            The patient tolerated the procedure well. Scope In: Scope Out: Findings:                 The esophagus was normal.                           The stomach was normal with prior lap band in                            expected position without obvious complication.                           The examined duodenum was normal.                           The cardia and gastric fundus were normal on                            retroflexion. Complications:            No immediate complications. Estimated Blood Loss:     Estimated blood loss: none. Impression:               - Normal esophagus.                           - Normal stomach status post lap band procedure.                           - Normal examined duodenum.                           - No specimens collected. Recommendation:           - Patient has a contact number available for                            emergencies. The signs and symptoms of potential  delayed complications were discussed with the                            patient. Return to normal activities tomorrow.                            Written discharge instructions were provided to the                            patient.                           - Resume previous diet.                           - Continue present medications.                           - Office follow-up with Dr. Henrene Pastor in 4 to 6 weeks Docia Chuck. Henrene Pastor, MD 08/17/2020 10:17:52 AM This report has been signed electronically.

## 2020-08-19 ENCOUNTER — Telehealth: Payer: Self-pay | Admitting: *Deleted

## 2020-08-19 NOTE — Telephone Encounter (Signed)
  Follow up Call-  Call back number 08/17/2020  Post procedure Call Back phone  # [phone]  Permission to leave phone message Yes  Some recent data might be hidden     Patient questions:  Do you have a fever, pain , or abdominal swelling? No. Pain Score  0 *  Have you tolerated food without any problems? Yes.    Have you been able to return to your normal activities? Yes.    Do you have any questions about your discharge instructions: Diet   No. Medications  No. Follow up visit  No.  Do you have questions or concerns about your Care? No.  Actions: * If pain score is 4 or above: No action needed, pain <4.  1. Have you developed a fever since your procedure? no  2.   Have you had an respiratory symptoms (SOB or cough) since your procedure? no  3.   Have you tested positive for COVID 19 since your procedure no  4.   Have you had any family members/close contacts diagnosed with the COVID 19 since your procedure?  no   If yes to any of these questions please route to Joylene John, RN and Joella Prince, RN

## 2020-08-20 LAB — GI PROFILE, STOOL, PCR

## 2020-08-23 ENCOUNTER — Other Ambulatory Visit: Payer: Self-pay | Admitting: Internal Medicine

## 2020-09-16 ENCOUNTER — Other Ambulatory Visit: Payer: Self-pay

## 2020-09-16 ENCOUNTER — Encounter (HOSPITAL_COMMUNITY): Payer: Self-pay

## 2020-09-16 ENCOUNTER — Telehealth: Payer: Self-pay | Admitting: Gastroenterology

## 2020-09-16 ENCOUNTER — Emergency Department (HOSPITAL_COMMUNITY)
Admission: EM | Admit: 2020-09-16 | Discharge: 2020-09-16 | Disposition: A | Discharge status: Home / Self Care | Payer: Medicare HMO | Attending: Emergency Medicine | Admitting: Emergency Medicine

## 2020-09-16 ENCOUNTER — Emergency Department (HOSPITAL_COMMUNITY): Payer: Medicare HMO

## 2020-09-16 DIAGNOSIS — R55 Syncope and collapse: Secondary | ICD-10-CM | POA: Diagnosis not present

## 2020-09-16 DIAGNOSIS — Z8542 Personal history of malignant neoplasm of other parts of uterus: Secondary | ICD-10-CM | POA: Diagnosis not present

## 2020-09-16 DIAGNOSIS — R109 Unspecified abdominal pain: Secondary | ICD-10-CM | POA: Diagnosis present

## 2020-09-16 DIAGNOSIS — I10 Essential (primary) hypertension: Secondary | ICD-10-CM | POA: Insufficient documentation

## 2020-09-16 DIAGNOSIS — J45901 Unspecified asthma with (acute) exacerbation: Secondary | ICD-10-CM | POA: Insufficient documentation

## 2020-09-16 DIAGNOSIS — Z79899 Other long term (current) drug therapy: Secondary | ICD-10-CM | POA: Diagnosis not present

## 2020-09-16 DIAGNOSIS — R110 Nausea: Secondary | ICD-10-CM | POA: Insufficient documentation

## 2020-09-16 DIAGNOSIS — R1084 Generalized abdominal pain: Secondary | ICD-10-CM

## 2020-09-16 DIAGNOSIS — K921 Melena: Secondary | ICD-10-CM | POA: Insufficient documentation

## 2020-09-16 DIAGNOSIS — R195 Other fecal abnormalities: Secondary | ICD-10-CM

## 2020-09-16 DIAGNOSIS — Z85820 Personal history of malignant melanoma of skin: Secondary | ICD-10-CM | POA: Insufficient documentation

## 2020-09-16 DIAGNOSIS — R197 Diarrhea, unspecified: Secondary | ICD-10-CM

## 2020-09-16 LAB — COMPREHENSIVE METABOLIC PANEL
ALT: 47 U/L — ABNORMAL HIGH (ref 0–44)
AST: 30 U/L (ref 15–41)
Albumin: 4.6 g/dL (ref 3.5–5.0)
Alkaline Phosphatase: 63 U/L (ref 38–126)
Anion gap: 10 (ref 5–15)
BUN: 9 mg/dL (ref 6–20)
CO2: 27 mmol/L (ref 22–32)
Calcium: 9.4 mg/dL (ref 8.9–10.3)
Chloride: 99 mmol/L (ref 98–111)
Creatinine, Ser: 0.81 mg/dL (ref 0.44–1.00)
GFR, Estimated: >60 mL/min (ref >60)
Glucose, Bld: 123 mg/dL — ABNORMAL HIGH (ref 70–99)
Potassium: 3.1 mmol/L — ABNORMAL LOW (ref 3.5–5.1)
Sodium: 136 mmol/L (ref 135–145)
Total Bilirubin: 0.9 mg/dL (ref 0.3–1.2)
Total Protein: 8.3 g/dL — ABNORMAL HIGH (ref 6.5–8.1)

## 2020-09-16 LAB — CBC WITH DIFFERENTIAL/PLATELET
Abs Immature Granulocytes: 0.02 10*3/uL (ref 0.00–0.07)
Basophils Absolute: 0 10*3/uL (ref 0.0–0.1)
Basophils Relative: 0 %
Eosinophils Absolute: 0.2 10*3/uL (ref 0.0–0.5)
Eosinophils Relative: 3 %
HCT: 43.1 % (ref 36.0–46.0)
Hemoglobin: 14.9 g/dL (ref 12.0–15.0)
Immature Granulocytes: 0 %
Lymphocytes Relative: 31 %
Lymphs Abs: 2.9 10*3/uL (ref 0.7–4.0)
MCH: 32.1 pg (ref 26.0–34.0)
MCHC: 34.6 g/dL (ref 30.0–36.0)
MCV: 92.9 fL (ref 80.0–100.0)
Monocytes Absolute: 0.8 10*3/uL (ref 0.1–1.0)
Monocytes Relative: 8 %
Neutro Abs: 5.5 10*3/uL (ref 1.7–7.7)
Neutrophils Relative %: 58 %
Platelets: 441 10*3/uL — ABNORMAL HIGH (ref 150–400)
RBC: 4.64 MIL/uL (ref 3.87–5.11)
RDW: 11.9 % (ref 11.5–15.5)
WBC: 9.4 10*3/uL (ref 4.0–10.5)
nRBC: 0 % (ref 0.0–0.2)

## 2020-09-16 LAB — POC OCCULT BLOOD, ED: Fecal Occult Bld: POSITIVE — AB

## 2020-09-16 LAB — URINALYSIS, ROUTINE W REFLEX MICROSCOPIC
Bacteria, UA: NONE SEEN
Bilirubin Urine: NEGATIVE
Glucose, UA: NEGATIVE mg/dL
Ketones, ur: 5 mg/dL — AB
Leukocytes,Ua: NEGATIVE
Nitrite: NEGATIVE
Protein, ur: NEGATIVE mg/dL
Specific Gravity, Urine: 1.034 — ABNORMAL HIGH (ref 1.005–1.030)
pH: 6 (ref 5.0–8.0)

## 2020-09-16 LAB — LIPASE, BLOOD: Lipase: 44 U/L (ref 11–51)

## 2020-09-16 MED ORDER — SODIUM CHLORIDE 0.9 % IV BOLUS
1000.0000 mL | Freq: Once | INTRAVENOUS | Status: AC
  Administered 2020-09-16: 1000 mL via INTRAVENOUS

## 2020-09-16 MED ORDER — FENTANYL CITRATE (PF) 100 MCG/2ML IJ SOLN
50.0000 ug | Freq: Once | INTRAMUSCULAR | Status: AC
  Administered 2020-09-16: 50 ug via INTRAVENOUS
  Filled 2020-09-16: qty 2

## 2020-09-16 MED ORDER — IOHEXOL 300 MG/ML  SOLN
100.0000 mL | Freq: Once | INTRAMUSCULAR | Status: AC | PRN
  Administered 2020-09-16: 100 mL via INTRAVENOUS

## 2020-09-16 MED ORDER — POTASSIUM CHLORIDE ER 20 MEQ PO TBCR: 20.0000 meq | EXTENDED_RELEASE_TABLET | Freq: Every day | ORAL | 0 refills | Status: DC

## 2020-09-16 MED ORDER — SODIUM CHLORIDE (PF) 0.9 % IJ SOLN
INTRAMUSCULAR | Status: AC
  Filled 2020-09-16: qty 50

## 2020-09-16 MED ORDER — ONDANSETRON HCL 4 MG/2ML IJ SOLN
4.0000 mg | Freq: Once | INTRAMUSCULAR | Status: AC
  Administered 2020-09-16: 4 mg via INTRAVENOUS
  Filled 2020-09-16: qty 2

## 2020-09-16 MED ORDER — POTASSIUM CHLORIDE CRYS ER 20 MEQ PO TBCR
40.0000 meq | EXTENDED_RELEASE_TABLET | Freq: Once | ORAL | Status: AC
  Administered 2020-09-16: 40 meq via ORAL
  Filled 2020-09-16: qty 2

## 2020-09-16 NOTE — ED Triage Notes (Signed)
Pt arrived via walk in, c/o diffuse abd cramping and bright blood in stool. States she has been having GI issues on and off since august, has followed with GI, had endoscopy that was negative. Woke last night with intense cramping. Denies any n/v.

## 2020-09-16 NOTE — Telephone Encounter (Signed)
I spoke with the patient .  She verbalized understanding of instructions.   She will go to the lab this am.  New lab orders placed.  She understands to go to the ED for worsening symptoms.

## 2020-09-16 NOTE — ED Provider Notes (Signed)
[facility] DEPT Provider Note   CSN: [id number] Arrival date & time: 09/16/20  8101     History Chief Complaint  Patient presents with  . Abdominal Pain    [name] is a 44 y.o. female with pertinent past medical history of gastroparesis, MS, hypertension that presents emergency department today for abdominal pain and bright red blood per rectum that started yesterday. Patient states that she started having abdominal cramping in h her entire abdomen, intermittent that started last night. States that she had a bowel movement last night and noticed a " large amount of gelatinous blood" in her stool. No diarrhea. States that this happened a couple times throughout the night. States that she called her GI doctor, follows Dr. Henrene Pastor who told her to come to the emergency department. When she was getting ready this morning to come to the ER she syncopized. Currently not complaining of any vision changes, neck pain. Is complaining of mild headache. Unsure if she hit her head. Does not think she had a seizure, was able to come to very quickly after passing out. Patient is not on any blood thinners. States that her daughter had to drive her here, unsure how long she passed out for, no more than a couple minutes. States that before she passed out she felt very dizzy,states that after she woke up and came here she feels lightheaded, no room spinning sensation. Patient did have hysterectomy, no chance of pregnancy. Patient denies any vomiting. States that she was slightly nauseous. Denies any fevers, chills, chest pain, shortness of breath, back pain. Abdominal pain does not radiate anywhere. Denies any alcohol or drug use. Has not taken anything for this. Was able to eat last night, did not eat this morning. No painful bowel movements, no constipation, no rectal pain.  Per chart review patient saw Dr. Henrene Pastor last months for cramping abdominal pain, nausea, vomiting and diarrhea  that were exacerbated by meals. No GI bleeding at that point. Patient had normal endoscopy last month.  HPI     Past Medical History:  Diagnosis Date  . Anginal pain ([facility])   . Asthma   . Gastroparesis   . Hypertension   . Migraine headache   . Multiple sclerosis, primary progressive ([facility] [facility]) 2010  . Neurogenic bladder   . Neurogenic bowel   . Obesity   . PCOS (polycystic ovarian syndrome)   . Polyneuropathy   . Seizures ([place])   . Skin cancer (melanoma) ([facility])    scalp  . Uterine cancer [facility])     Patient Active Problem List   Diagnosis Date Noted  . Neuropathic pain of both legs 09/16/2013  . Exacerbation of multiple sclerosis ([place]) 08/01/2013  . Multiple sclerosis exacerbation ([place]) 06/30/2013  . MIGRAINE HEADACHE 12/15/2010  . SINUSITIS 01/19/2010  . BRONCHITIS 01/12/2010  . Multiple sclerosis ([place]) 06/16/2009  . ASTHMA, WITH ACUTE EXACERBATION 03/23/2009  . ALLERGIC RHINITIS 02/07/2009  . ASTHMATIC BRONCHITIS, ACUTE 12/17/2008  . OBESITY, MORBID 02/23/2008  . FATTY LIVER DISEASE 01/27/2008  . HYPERLIPIDEMIA 01/19/2008  . Essential hypertension 06/27/2007  . ASTHMA 06/27/2007  . GERD 06/27/2007    Past Surgical History:  Procedure Laterality Date  . ABDOMINAL HYSTERECTOMY    . BARIATRIC SURGERY     lapband  . CESAREAN SECTION    . CHOLECYSTECTOMY    . COLON SURGERY    . DILATION AND CURETTAGE OF UTERUS    . IUD REMOVAL  03/19/2012   denies  . LAPAROSCOPIC GASTRIC  BANDING  01/26/08  . SKIN CANCER EXCISION     scalp and radiation     OB History    Gravida  1   Para  1   Term  1   Preterm      AB      Living  1     SAB  0   TAB      Ectopic  0   Multiple  0   Live Births              Family History  Problem Relation Age of Onset  . Diabetes Mother   . Depression Mother   . Hypertension Mother   . Hyperlipidemia Mother   . Alzheimer's disease Mother        dx in her 25's  . Liver disease Father   . Depression Daughter         bipolar depression  . Coronary artery disease Other   . Cancer Other        ovarian  . Colon polyps Brother        benign  . Uterine cancer Maternal Grandmother   . Colon cancer Maternal Uncle   . Anesthesia problems Neg Hx     Social History   Tobacco Use  . Smoking status: Never Smoker  . Smokeless tobacco: Never Used  Vaping Use  . Vaping Use: Never used  Substance Use Topics  . Alcohol use: No  . Drug use: Not Currently    Comment: THC gummies on occ.    Home Medications Prior to Admission medications   Medication Sig Start Date End Date Taking? Authorizing Provider  acetaminophen (TYLENOL) 325 MG tablet Take 650 mg by mouth every 6 (six) hours as needed for mild pain, fever or headache.   Yes [provider]  AIMOVIG 140 MG/ML SOAJ Inject 140 mg into the skin every 30 (thirty) days. 08/30/20  Yes [provider]  atorvastatin (LIPITOR) 20 MG tablet Take 20 mg by mouth daily.   Yes [provider]  baclofen (LIORESAL) 10 MG tablet Take 1 tablet (10 mg total) by mouth 3 (three) times daily. Muscle spasm Patient taking differently: Take 10 mg by mouth 3 (three) times daily as needed for muscle spasms. Muscle spasm  01/30/18  Yes Terald Sleeper, PA-C  dicyclomine (BENTYL) 20 MG tablet Take one tablet every 4-6 hours as needed Patient taking differently: Take 20 mg by mouth 4 (four) times daily as needed for spasms. Take one tablet every 4-6 hours as needed 08/16/20  Yes Irene Shipper, MD  gabapentin (NEURONTIN) 600 MG tablet TAKE 2 TABLETS 4 TIMES A DAY Patient taking differently: Take 1,200 mg by mouth 4 (four) times daily as needed (neuropathy).  03/04/18  Yes Terald Sleeper, PA-C  hydrochlorothiazide (HYDRODIURIL) 25 MG tablet Take 25 mg by mouth daily.   Yes [provider]  HYDROcodone-acetaminophen (NORCO) 10-325 MG tablet Take 1 tablet by mouth every 6 (six) hours as needed for moderate pain.   Yes [provider]  losartan  (COZAAR) 50 MG tablet Take 50 mg by mouth daily.   Yes [provider]  metFORMIN (GLUCOPHAGE) 500 MG tablet Take 250 mg by mouth daily.    Yes [provider]  ocrelizumab (OCREVUS) 300 MG/10ML injection Inject 300 mg into the vein every 6 (six) months.   Yes [provider]  omega-3 acid ethyl esters (LOVAZA) 1 g capsule Take 1 g by mouth 2 (two)  times daily.   Yes [provider]  Probiotic Product (PROBIOTIC PO) Take 1 capsule by mouth daily.   Yes [provider]  temazepam (RESTORIL) 15 MG capsule Take 15 mg by mouth at bedtime as needed for sleep.   Yes [provider]  triamcinolone cream (KENALOG) 0.5 % Apply 1 application topically daily as needed. Patient taking differently: Apply 1 application topically daily as needed (routine healing).  01/30/18  Yes Terald Sleeper, PA-C  diclofenac sodium (VOLTAREN) 1 % GEL Apply 4 g topically 4 (four) times daily. Patient not taking: Reported on 08/17/2020 01/30/18   Terald Sleeper, PA-C  diclofenac sodium (VOLTAREN) 1 % GEL Apply 2 g topically 4 (four) times daily as needed (pain). Patient not taking: Reported on 08/17/2020    [provider]  diphenhydrAMINE (BENADRYL) 25 MG tablet Take 1 tablet (25 mg total) by mouth every 6 (six) hours. Patient not taking: Reported on 09/16/2020 03/31/17   Ezequiel Essex, MD  famotidine (PEPCID) 20 MG tablet Take 1 tablet (20 mg total) by mouth 2 (two) times daily. Patient not taking: Reported on 07/19/2019 03/31/17   Ezequiel Essex, MD  ondansetron (ZOFRAN-ODT) 4 MG disintegrating tablet Take 1 tablet (4 mg total) by mouth every 8 (eight) hours as needed for nausea or vomiting. Patient not taking: Reported on 09/16/2020 01/30/18   Terald Sleeper, PA-C  potassium chloride 20 MEQ TBCR Take 20 mEq by mouth daily. 09/16/20   Alfredia Client, PA-C    Allergies    Toradol [ketorolac tromethamine] and Metformin and related  Review of Systems   Review of  Systems  Constitutional: Negative for chills, diaphoresis, fatigue and fever.  HENT: Negative for congestion, sore throat and trouble swallowing.   Eyes: Negative for pain and visual disturbance.  Respiratory: Negative for cough, shortness of breath and wheezing.   Cardiovascular: Negative for chest pain, palpitations and leg swelling.  Gastrointestinal: Positive for abdominal pain, blood in stool and nausea. Negative for abdominal distention, diarrhea and vomiting.  Genitourinary: Negative for difficulty urinating.  Musculoskeletal: Negative for back pain, neck pain and neck stiffness.  Skin: Negative for pallor.  Neurological: Positive for syncope and light-headedness. Negative for dizziness, seizures, facial asymmetry, speech difficulty, weakness, numbness and headaches.  Psychiatric/Behavioral: Negative for confusion.    Physical Exam Updated Vital Signs BP (!) 138/99 (BP Location: Left Arm)   Pulse 86   Temp 98.4 F (36.9 C) (Oral)   Resp 20   LMP 03/17/2012   SpO2 100%   Physical Exam Constitutional:      General: She is not in acute distress.    Appearance: Normal appearance. She is not ill-appearing, toxic-appearing or diaphoretic.  HENT:     Head: Normocephalic and atraumatic.     Mouth/Throat:     Mouth: Mucous membranes are moist.     Pharynx: Oropharynx is clear.  Eyes:     General: No scleral icterus.    Extraocular Movements: Extraocular movements intact.     Pupils: Pupils are equal, round, and reactive to light.  Cardiovascular:     Rate and Rhythm: Normal rate and regular rhythm.     Pulses: Normal pulses.     Heart sounds: Normal heart sounds.  Pulmonary:     Effort: Pulmonary effort is normal. No respiratory distress.     Breath sounds: Normal breath sounds. No stridor. No wheezing, rhonchi or rales.  Chest:     Chest wall: No tenderness.  Abdominal:  General: Abdomen is flat. Bowel sounds are normal. There is no distension.     Palpations:  Abdomen is soft.     Tenderness: There is generalized abdominal tenderness and tenderness in the epigastric area. There is no right CVA tenderness, left CVA tenderness, guarding or rebound. Negative signs include Murphy's sign, Rovsing's sign, McBurney's sign, psoas sign and obturator sign.  Genitourinary:    Comments: Chaperone present. Digital Rectal exam reveals sphincter with good tone. No external hemorrhoids, masses, or fissures. Stool color is brown with no overt blood. No gross melena.  Musculoskeletal:        General: No swelling or tenderness. Normal range of motion.     Cervical back: Normal range of motion and neck supple. No rigidity.     Right lower leg: No edema.     Left lower leg: No edema.  Skin:    General: Skin is warm and dry.     Capillary Refill: Capillary refill takes less than 2 seconds.     Coloration: Skin is not pale.  Neurological:     General: No focal deficit present.     Mental Status: She is alert and oriented to person, place, and time.     Comments: Alert. Clear speech. No facial droop. CNIII-XII grossly intact. Bilateral upper and lower extremities' sensation grossly intact. 5/5 symmetric strength with grip strength and with plantar and dorsi flexion bilaterally. Normal finger to nose bilaterally. Negative pronator drift.  Psychiatric:        Mood and Affect: Mood normal.        Behavior: Behavior normal.     ED Results / Procedures / Treatments   Labs (all labs ordered are listed, but only abnormal results are displayed) Labs Reviewed  CBC WITH DIFFERENTIAL/PLATELET - Abnormal; Notable for the following components:      Result Value   Platelets 441 (*)    All other components within normal limits  COMPREHENSIVE METABOLIC PANEL - Abnormal; Notable for the following components:   Potassium 3.1 (*)    Glucose, Bld 123 (*)    Total Protein 8.3 (*)    ALT 47 (*)    All other components within normal limits  URINALYSIS, ROUTINE W REFLEX MICROSCOPIC  - Abnormal; Notable for the following components:   Specific Gravity, Urine 1.034 (*)    Hgb urine dipstick SMALL (*)    Ketones, ur 5 (*)    All other components within normal limits  POC OCCULT BLOOD, ED - Abnormal; Notable for the following components:   Fecal Occult Bld POSITIVE (*)    All other components within normal limits  LIPASE, BLOOD    EKG None  Radiology CT Head Wo Contrast  Result Date: 09/16/2020 CLINICAL DATA:  Syncope EXAM: CT HEAD WITHOUT CONTRAST TECHNIQUE: Contiguous axial images were obtained from the base of the skull through the vertex without intravenous contrast. COMPARISON:  2014 FINDINGS: Brain: There is no acute intracranial hemorrhage, mass effect, or edema. Gray-white differentiation is preserved. There is no extra-axial fluid collection. Ventricles and sulci are within normal limits in size and configuration. Vascular: No hyperdense vessel or unexpected calcification. Skull: Calvarium is unremarkable. Sinuses/Orbits: No acute finding. Other: None. IMPRESSION: No acute intracranial abnormality. Electronically Signed   By: Macy Mis M.D.   On: 09/16/2020 12:57   CT Abdomen Pelvis W Contrast  Result Date: 09/16/2020 CLINICAL DATA:  Abdominal pain EXAM: CT ABDOMEN AND PELVIS WITH CONTRAST TECHNIQUE: Multidetector CT imaging of the abdomen and pelvis was  performed using the standard protocol following bolus administration of intravenous contrast. CONTRAST:  156mL OMNIPAQUE IOHEXOL 300 MG/ML  SOLN COMPARISON:  2007 FINDINGS: Lower chest: No acute abnormality. Hepatobiliary: No focal liver abnormality is seen. Status post cholecystectomy. No biliary dilatation. Pancreas: Unremarkable. Spleen: Unremarkable. Adrenals/Urinary Tract: Adrenals, kidneys, and bladder are unremarkable. Stomach/Bowel: Lap band is present about the proximal stomach. Bowel is normal in caliber. Vascular/Lymphatic: Minor aortic atherosclerosis. No enlarged lymph nodes. Reproductive: No pelvic  mass. Other: No ascites.  No abdominal wall hernia. Musculoskeletal: No acute osseous abnormality. IMPRESSION: No acute abnormality. Lap band is present. Electronically Signed   By: Macy Mis M.D.   On: 09/16/2020 12:49    Procedures Procedures (including critical care time)  Medications Ordered in ED Medications  sodium chloride (PF) 0.9 % injection (has no administration in time range)  ondansetron (ZOFRAN) injection 4 mg (4 mg Intravenous Given 09/16/20 0934)  fentaNYL (SUBLIMAZE) injection 50 mcg (50 mcg Intravenous Given 09/16/20 0934)  sodium chloride 0.9 % bolus 1,000 mL (0 mLs Intravenous Stopped 09/16/20 1100)  fentaNYL (SUBLIMAZE) injection 50 mcg (50 mcg Intravenous Given 09/16/20 1044)  iohexol (OMNIPAQUE) 300 MG/ML solution 100 mL (100 mLs Intravenous Contrast Given 09/16/20 1215)  potassium chloride SA (KLOR-CON) CR tablet 40 mEq (40 mEq Oral Given 09/16/20 1350)    ED Course  I have reviewed the triage vital signs and the nursing notes.  Pertinent labs & imaging results that were available during my care of the patient were reviewed by me and considered in my medical decision making (see chart for details).    MDM Rules/Calculators/A&P                         Devri Kreher is a 44 y.o. female with pertinent past medical history of gastroparesis, MS, hypertension that presents emergency department today for abdominal pain and bright red blood per rectum that started yesterday.  Patient with generalized abdominal tenderness, fecal occult positive.  No bright red blood per rectum on digital rectal exam, initial interventions include fentanyl and fluids and Zofran.  Normal orthostatics.  Work-up today reassuring, hemoglobin is 14.9, patient is not tachycardic.  Patient has normal gait.  CMP normal, besides potassium of 3.1.  Did replete here today.   Upon reevaluation patient states that she feels much better with fluids andFentanyl and Zofran.  EKG without any signs of  ischemia or arrhythmia.  CT head normal, CT abdomen normal.  I think patient most likely had vasovagal syncope since patient did have prodrome and was able to get back to baseline within a couple seconds.  Patient with normal neuro exam.  Patient was likely had vasovagal syncope after seeing blood in the toilet.  Did discuss this with patient, patient agreeable.  Patient to be discharged and have her follow-up with Dr. Henrene Pastor.  Doubt need for further emergent work up at this time. I explained the diagnosis and have given explicit precautions to return to the ER including for any other new or worsening symptoms. The patient understands and accepts the medical plan as it's been dictated and I have answered their questions. Discharge instructions concerning home care and prescriptions have been given. The patient is STABLE and is discharged to home in good condition.  I discussed this case with my attending physician who cosigned this note including patient's presenting symptoms, physical exam, and planned diagnostics and interventions. Attending physician stated agreement with plan or made changes to plan which were  implemented.     Final Clinical Impression(s) / ED Diagnoses Final diagnoses:  Heme positive stool  Generalized abdominal pain  Vasovagal syncope    Rx / DC Orders ED Discharge Orders         Ordered    potassium chloride 20 MEQ TBCR  Daily        09/16/20 1358           Alfredia Client, PA-C 09/16/20 1414    Lacretia Leigh, MD 09/22/20 1144

## 2020-09-16 NOTE — Addendum Note (Signed)
Addended by: Marlon Pel on: 09/16/2020 08:23 AM   Modules accepted: Orders

## 2020-09-16 NOTE — Telephone Encounter (Signed)
Patient ended up going to the ED this morning. Please see their notes for details. I have asked [name] to arrange follow-up at [facility] next week. Thanks.

## 2020-09-16 NOTE — Discharge Instructions (Signed)
Your work-up today was reassuring, your potassium was slightly low, we repleted that here and I will also prescribe you some.  Want you to follow-up with Dr. Henrene Pastor in regards to your positive blood in your stool.  If you have any new or worsening concerning symptoms please come back to the emergency department as we spoke about.  Please use the attached instructions.

## 2020-09-16 NOTE — Telephone Encounter (Signed)
Noted. Thanks team.

## 2020-09-16 NOTE — Telephone Encounter (Signed)
[facility] MD: [name]  After hours call: acute GI symptoms  Chart reviewed. Recent endoscopy noted. Patient's symptoms leading up to EGD had completely resolved.   Fell ill last evening at 5pm. Developed severe lower abdominal pain and cramping not responding to the Bentyl as previously prescribed by Dr. Henrene Shields. Had a near normal bowel movement after the cramping first started, but, stools have become progressively more green/yellow and watery. Unable to sleep due to the symptoms. She noted about 0.5 cups of blood in the last stool this morning. No prior history of bleeding. + chills and night sweats. No fevers. No sick contacts. She ate a meal yesterday with multiple people, none of whom are sick.   [name], I did not realize that Dr. Henrene Shields was off today when I spoke to the patient (while driving to work). Please ask her to have labs: CMP, CBC with diff, and a GI pathogen panel today. If she continues to have significant bleeding or develops any symptoms of anemia or other associated worrisome symptoms, she should seek care at the ED as she may warrant contrasted cross-sectional imaging. Please have the lab results forwarded to me and I will review them today.  I would recommend that she try bismuth salicylate (Pepto-Bismol) 30 mL or two tablets every 30 minutes for eight doses in addition to the Bentyl. Pepto-Bismol may make her stools black. A daily probiotic may also be very helpful.  Thank you.

## 2020-09-16 NOTE — Telephone Encounter (Signed)
Reviewed. Unremarkable labs and CT. [name], I am completely full next week. Follow up with any provider would be fine. Thanks

## 2020-09-16 NOTE — Telephone Encounter (Signed)
MD notes and lab results from ER visit today reviewed. Please arrange follow-up at [facility] next week.  Thank you.

## 2020-09-19 DIAGNOSIS — K625 Hemorrhage of anus and rectum: Secondary | ICD-10-CM

## 2020-09-19 HISTORY — DX: Hemorrhage of anus and rectum: K62.5

## 2020-09-19 NOTE — Telephone Encounter (Signed)
Pt scheduled to see Dr. Carlean Purl tomorrow at 2:50pm. Pt aware of appt.

## 2020-09-20 ENCOUNTER — Ambulatory Visit: Payer: Medicare HMO | Admitting: Internal Medicine

## 2020-09-20 ENCOUNTER — Encounter: Payer: Self-pay | Admitting: Internal Medicine

## 2020-09-20 VITALS — BP 120/60 | HR 77 | Ht 63.0 in | Wt 216.0 lb

## 2020-09-20 DIAGNOSIS — R197 Diarrhea, unspecified: Secondary | ICD-10-CM

## 2020-09-20 DIAGNOSIS — R1033 Periumbilical pain: Secondary | ICD-10-CM | POA: Diagnosis not present

## 2020-09-20 DIAGNOSIS — K921 Melena: Secondary | ICD-10-CM

## 2020-09-20 MED ORDER — DICYCLOMINE HCL 20 MG PO TABS: ORAL_TABLET | ORAL | 0 refills | Status: DC

## 2020-09-20 MED ORDER — SUTAB 1479-225-188 MG PO TABS: 1.0000 | ORAL_TABLET | ORAL | 0 refills | Status: DC

## 2020-09-20 NOTE — Progress Notes (Signed)
[name] 44 y.o. 1976-09-14 [id number]  Assessment & Plan:   Encounter Diagnoses  Name Primary?  . Blood in stool Yes  . Periumbilical abdominal pain   . Diarrhea, unspecified type     Colonoscopy with terminal ileum intubation is warranted to investigate.  Question IBD as a unifying diagnosis.  Could be IBS with rectal bleeding perhaps.  Refill dicyclomine at her request.  She is on for colonoscopy with Dr. Henrene Pastor tomorrow.  CC: Benito Mccreedy, MD Dr. Scarlette Shorts    Subjective:   Chief Complaint: Abdominal pain blood in the stool  HPI  the patient is a 44 year old African-American woman with a history of multiple sclerosis, polycystic ovary syndrome, melanoma, uterine cancer and gastroparesis status post laparoscopic gastric banding who presents for follow-up after an episode of severe abdominal cramps and pain in the periumbilical region associated with diarrhea and hematochezia.  She went to the emergency department on September 16, 2020 due to severe abdominal cramps and pain.  She reports the pain was so severe that she had syncope and fell and struck her head.  She describes a fairly large amount of a gelatinous bloody stool or stools that occurred.  She had recently seen Dr. Henrene Pastor for nausea vomiting and some diarrhea symptoms and had an upper GI endoscopy due to a predominance of upper GI symptoms on August 17, 2020 admit demonstrated a normal post laparoscopic gastric band anatomy.  She was treated with dicyclomine and said it was helping though it did not relieve the severe pain she had on the 15th.  Since that time she feels better.  Still having some residual cramps but nothing as severe no more bleeding no diarrhea.  When she was seen in the emergency department she did test Hemoccult positive.  Mild hypokalemia with potassium 3.1 mild ALT elevation at 47 slight elevation of total protein of 8.3 otherwise normal CMET.  CBC with platelets of 441 which are mildly  elevated otherwise normal.  Lipase was normal.  Small amount of ketones in the urine small amount of hemoglobin otherwise negative UA.  CT head in the ED 1015 - and CT abdomen and pelvis negative In September in our office she had a negative GI pathogen panel. Allergies  Allergen Reactions  . Toradol [Ketorolac Tromethamine] Other (See Comments)    Causes SEIZURES  . Metformin And Related     Diarrhea with higher doses   Current Meds  Medication Sig  . acetaminophen (TYLENOL) 325 MG tablet Take 650 mg by mouth every 6 (six) hours as needed for mild pain, fever or headache.  [name] AIMOVIG 140 MG/ML SOAJ Inject 140 mg into the skin every 30 (thirty) days.  [name] atorvastatin (LIPITOR) 20 MG tablet Take 20 mg by mouth daily.  . baclofen (LIORESAL) 10 MG tablet Take 1 tablet (10 mg total) by mouth 3 (three) times daily. Muscle spasm (Patient taking differently: Take 10 mg by mouth 3 (three) times daily as needed for muscle spasms. Muscle spasm )  . diclofenac sodium (VOLTAREN) 1 % GEL Apply 4 g topically 4 (four) times daily.  [name] dicyclomine (BENTYL) 20 MG tablet Take one tablet every 4-6 hours as needed  . diphenhydrAMINE (BENADRYL) 25 MG tablet Take 1 tablet (25 mg total) by mouth every 6 (six) hours.  . famotidine (PEPCID) 20 MG tablet Take 1 tablet (20 mg total) by mouth 2 (two) times daily. (Patient taking differently: Take 20 mg by mouth as needed. )  . gabapentin (NEURONTIN) 600  MG tablet TAKE 2 TABLETS 4 TIMES A DAY (Patient taking differently: Take 1,200 mg by mouth 4 (four) times daily as needed (neuropathy). )  . hydrochlorothiazide (HYDRODIURIL) 25 MG tablet Take 25 mg by mouth daily.  [name] HYDROcodone-acetaminophen (NORCO) 10-325 MG tablet Take 1 tablet by mouth every 6 (six) hours as needed for moderate pain.  [name] losartan (COZAAR) 50 MG tablet Take 50 mg by mouth daily.  . metFORMIN (GLUCOPHAGE) 500 MG tablet Take 250 mg by mouth daily.   [name] ocrelizumab (OCREVUS) 300 MG/10ML injection Inject  300 mg into the vein every 6 (six) months.  [name] omega-3 acid ethyl esters (LOVAZA) 1 g capsule Take 1 g by mouth 2 (two) times daily.  . ondansetron (ZOFRAN-ODT) 4 MG disintegrating tablet Take 1 tablet (4 mg total) by mouth every 8 (eight) hours as needed for nausea or vomiting.  . potassium chloride 20 MEQ TBCR Take 20 mEq by mouth daily.  . Probiotic Product (PROBIOTIC PO) Take 1 capsule by mouth daily.  . temazepam (RESTORIL) 15 MG capsule Take 15 mg by mouth at bedtime as needed for sleep.  [name] triamcinolone cream (KENALOG) 0.5 % Apply 1 application topically daily as needed. (Patient taking differently: Apply 1 application topically daily as needed (routine healing). )  . [DISCONTINUED] dicyclomine (BENTYL) 20 MG tablet Take one tablet every 4-6 hours as needed (Patient taking differently: Take 20 mg by mouth 4 (four) times daily as needed for spasms. Take one tablet every 4-6 hours as needed)   Past Medical History:  Diagnosis Date  . Anginal pain ([place])   . Asthma   . Gastroparesis   . Hypertension   . Migraine headache   . 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 Neurogenic bladder   . Neurogenic bowel   . Obesity   . PCOS (polycystic ovarian syndrome)   . Polyneuropathy   . Seizures ([place])   . Skin cancer (melanoma) ([facility])    scalp  . Uterine cancer [facility])    Past Surgical History:  Procedure Laterality Date  . ABDOMINAL HYSTERECTOMY    . BARIATRIC SURGERY     lapband  . CESAREAN SECTION    . CHOLECYSTECTOMY    . COLON SURGERY    . DILATION AND CURETTAGE OF UTERUS    . IUD REMOVAL  03/19/2012   denies  . LAPAROSCOPIC GASTRIC BANDING  01/26/08  . SKIN CANCER EXCISION     scalp and radiation   Social History   Social History Narrative   Divorced, 1 daughter   Patient has a BA degree.  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   Review of Systems As per HPI  Objective:   Physical Exam BP 120/60   Pulse 77   Ht 5\' 3"  (1.6 m)   Wt 216 lb (98 kg)   LMP 03/17/2012   SpO2 97%   BMI 38.26 kg/m  Obese pleasant black woman no acute distress Eyes are anicteric Abdomen with normal  bowel sounds soft nontender no organomegaly or mass

## 2020-09-20 NOTE — Patient Instructions (Addendum)
You have been scheduled for a colonoscopy. Please follow written instructions given to you at your visit today.  Please use the sample sutab prep kit we gave you today. If you use inhalers (even only as needed), please bring them with you on the day of your procedure.   We have sent the following medications to your pharmacy for you to pick up at your convenience: Dicyclomine    Due to recent changes in healthcare laws, you may see the results of your imaging and laboratory studies on MyChart before your provider has had a chance to review them.  We understand that in some cases there may be results that are confusing or concerning to you. Not all laboratory results come back in the same time frame and the provider may be waiting for multiple results in order to interpret others.  Please give [place] 48 hours in order for your provider to thoroughly review all the results before contacting the office for clarification of your results.   I appreciate the opportunity to care for you. Silvano Rusk, MD, [facility]

## 2020-09-21 ENCOUNTER — Ambulatory Visit (AMBULATORY_SURGERY_CENTER): Payer: Medicare HMO | Admitting: Internal Medicine

## 2020-09-21 ENCOUNTER — Encounter: Payer: Self-pay | Admitting: Internal Medicine

## 2020-09-21 ENCOUNTER — Other Ambulatory Visit: Payer: Self-pay

## 2020-09-21 VITALS — BP 129/79 | HR 85 | Temp 97.1°F | Resp 11 | Ht 63.0 in | Wt 216.0 lb

## 2020-09-21 DIAGNOSIS — R1033 Periumbilical pain: Secondary | ICD-10-CM

## 2020-09-21 DIAGNOSIS — K921 Melena: Secondary | ICD-10-CM

## 2020-09-21 DIAGNOSIS — K625 Hemorrhage of anus and rectum: Secondary | ICD-10-CM | POA: Diagnosis not present

## 2020-09-21 DIAGNOSIS — R197 Diarrhea, unspecified: Secondary | ICD-10-CM

## 2020-09-21 HISTORY — PX: COLONOSCOPY: SHX174

## 2020-09-21 MED ORDER — SODIUM CHLORIDE 0.9 % IV SOLN: 500.0000 mL | Freq: Once | INTRAVENOUS | Status: AC

## 2020-09-21 NOTE — Progress Notes (Signed)
Called to room to assist during endoscopic procedure.  Patient ID and intended procedure confirmed with present staff. Received instructions for my participation in the procedure from the performing physician.  

## 2020-09-21 NOTE — Patient Instructions (Signed)
Thank you for letting us take care of your healthcare needs today. Await pathology results.   YOU HAD AN ENDOSCOPIC PROCEDURE TODAY AT [facility]:   Refer to the procedure report that was given to you for any specific questions about what was found during the examination.  If the procedure report does not answer your questions, please call your gastroenterologist to clarify.  If you requested that your care partner not be given the details of your procedure findings, then the procedure report has been included in a sealed envelope for you to review at your convenience later.  YOU SHOULD EXPECT: Some feelings of bloating in the abdomen. Passage of more gas than usual.  Walking can help get rid of the air that was put into your GI tract during the procedure and reduce the bloating. If you had a lower endoscopy (such as a colonoscopy or flexible sigmoidoscopy) you may notice spotting of blood in your stool or on the toilet paper. If you underwent a bowel prep for your procedure, you may not have a normal bowel movement for a few days.  Please Note:  You might notice some irritation and congestion in your nose or some drainage.  This is from the oxygen used during your procedure.  There is no need for concern and it should clear up in a day or so.  SYMPTOMS TO REPORT IMMEDIATELY:   Following lower endoscopy (colonoscopy or flexible sigmoidoscopy):  Excessive amounts of blood in the stool  Significant tenderness or worsening of abdominal pains  Swelling of the abdomen that is new, acute  Fever of 100F or higher   For urgent or emergent issues, a gastroenterologist can be reached at any hour by calling [phone]. Do not use MyChart messaging for urgent concerns.    DIET:  We do recommend a small meal at first, but then you may proceed to your regular diet.  Drink plenty of fluids but you should avoid alcoholic beverages for 24 hours.  ACTIVITY:  You should plan to take it  easy for the rest of today and you should NOT DRIVE or use heavy machinery until tomorrow (because of the sedation medicines used during the test).    FOLLOW UP: Our staff will call the number listed on your records 48-72 hours following your procedure to check on you and address any questions or concerns that you may have regarding the information given to you following your procedure. If we do not reach you, we will leave a message.  We will attempt to reach you two times.  During this call, we will ask if you have developed any symptoms of COVID 19. If you develop any symptoms (ie: fever, flu-like symptoms, shortness of breath, cough etc.) before then, please call [phone].  If you test positive for Covid 19 in the 2 weeks post procedure, please call and report this information to [place].    If any biopsies were taken you will be contacted by phone or by letter within the next 1-3 weeks.  Please call us at [phone] if you have not heard about the biopsies in 3 weeks.    SIGNATURES/CONFIDENTIALITY: You and/or your care partner have signed paperwork which will be entered into your electronic medical record.  These signatures attest to the fact that that the information above on your After Visit Summary has been reviewed and is understood.  Full responsibility of the confidentiality of this discharge information lies with you and/or your care-partner.

## 2020-09-21 NOTE — Progress Notes (Signed)
Pt's states no medical or surgical changes since previsit or office visit.  Vitals PH

## 2020-09-21 NOTE — Op Note (Signed)
[facility] Patient Name: [name] Procedure Date: 09/21/2020 11:21 AM MRN: [id number] Endoscopist: Docia Chuck. Henrene Pastor MD, MD Age: 44 Referring MD:  Date of Birth: 1976-06-06 Gender: Female Account #: [id number] Procedure:                Colonoscopy with biopsies Indications:              Abdominal pain, Rectal bleeding Medicines:                Monitored Anesthesia Care Procedure:                Pre-Anesthesia Assessment:                           - Prior to the procedure, a History and Physical                            was performed, and patient medications and                            allergies were reviewed. The patient's tolerance of                            previous anesthesia was also reviewed. The risks                            and benefits of the procedure and the sedation                            options and risks were discussed with the patient.                            All questions were answered, and informed consent                            was obtained. Prior Anticoagulants: The patient has                            taken no previous anticoagulant or antiplatelet                            agents. ASA Grade Assessment: II - A patient with                            mild systemic disease. After reviewing the risks                            and benefits, the patient was deemed in                            satisfactory condition to undergo the procedure.                           After obtaining informed consent, the colonoscope  was passed under direct vision. Throughout the                            procedure, the patient's blood pressure, pulse, and                            oxygen saturations were monitored continuously. The                            Colonoscope was introduced through the anus and                            advanced to the the cecum, identified by                            appendiceal orifice and  ileocecal valve. The                            terminal ileum, ileocecal valve, appendiceal                            orifice, and rectum were photographed. The quality                            of the bowel preparation was excellent. The                            colonoscopy was performed without difficulty. The                            patient tolerated the procedure well. The bowel                            preparation used was SUPREP via split dose                            instruction. Scope In: 11:40:31 AM Scope Out: 11:52:51 AM Scope Withdrawal Time: 0 hours 10 minutes 3 seconds  Total Procedure Duration: 0 hours 12 minutes 20 seconds  Findings:                 The terminal ileum appeared normal.                           The colon revealed very subtle patchy hyperemia in                            the segmental fashion in the sigmoid colon.                            Biopsies were taken with a cold forceps for                            histology. The colon was otherwise normal on direct  and retroflexed views. Complications:            No immediate complications. Estimated blood loss:                            None. Estimated Blood Loss:     Estimated blood loss: none. Impression:               - The examined portion of the ileum was normal.                           - Subtle segmental hyperemia of the sigmoid colon.                            Biopsied                           - Otherwise normal colonoscopy.                           - Suspect mild self-limited colitis (ischemic                            versus infectious) Recommendation:           - Repeat colonoscopy in 10 years for surveillance.                           - Patient has a contact number available for                            emergencies. The signs and symptoms of potential                            delayed complications were discussed with the                             patient. Return to normal activities tomorrow.                            Written discharge instructions were provided to the                            patient.                           - Resume previous diet.                           - Continue present medications.                           - Await pathology results. Docia Chuck. Henrene Pastor MD, MD 09/21/2020 12:00:15 PM This report has been signed electronically.

## 2020-09-21 NOTE — [facility] Notes (Signed)
Report to PACU, RN, vss, BBS= Clear.  

## 2020-09-23 ENCOUNTER — Telehealth: Payer: Self-pay | Admitting: *Deleted

## 2020-09-23 NOTE — Telephone Encounter (Signed)
1. Have you developed a fever since your procedure? no  2.   Have you had an respiratory symptoms (SOB or cough) since your procedure? no  3.   Have you tested positive for COVID 19 since your procedure no  4.   Have you had any family members/close contacts diagnosed with the COVID 19 since your procedure?  no   If yes to any of these questions please route to Joylene John, RN and Joella Prince, RN Follow up Call-  Call back number 09/21/2020 08/17/2020  Post procedure Call Back phone  # [phone]  Permission to leave phone message Yes Yes  Some recent data might be hidden     Patient questions:  Do you have a fever, pain , or abdominal swelling? No. Pain Score  0 *  Have you tolerated food without any problems? Yes.    Have you been able to return to your normal activities? Yes.    Do you have any questions about your discharge instructions: Diet   No. Medications  No. Follow up visit  No.  Do you have questions or concerns about your Care? No.  Actions: * If pain score is 4 or above: No action needed, pain <4.

## 2020-09-26 ENCOUNTER — Encounter: Payer: Self-pay | Admitting: Internal Medicine

## 2020-10-04 ENCOUNTER — Encounter: Payer: Self-pay | Admitting: Internal Medicine

## 2020-10-04 ENCOUNTER — Ambulatory Visit (INDEPENDENT_AMBULATORY_CARE_PROVIDER_SITE_OTHER): Payer: Medicare HMO | Admitting: Internal Medicine

## 2020-10-04 VITALS — BP 130/86 | HR 88 | Ht 63.0 in | Wt 209.0 lb

## 2020-10-04 DIAGNOSIS — K921 Melena: Secondary | ICD-10-CM

## 2020-10-04 DIAGNOSIS — K219 Gastro-esophageal reflux disease without esophagitis: Secondary | ICD-10-CM | POA: Diagnosis not present

## 2020-10-04 DIAGNOSIS — Z9884 Bariatric surgery status: Secondary | ICD-10-CM | POA: Diagnosis not present

## 2020-10-04 DIAGNOSIS — R109 Unspecified abdominal pain: Secondary | ICD-10-CM

## 2020-10-04 NOTE — Progress Notes (Signed)
HISTORY OF PRESENT ILLNESS:  [name] is a pleasant 44 y.o. female, remote working Therapist, art, with history of MS, prior lap band procedure, and prior cholecystectomy who I saw August 16, 2020 for abdominal pain, nausea, vomiting, and diarrhea.  See that dictation.  Stool studies were negative for enteric pathogens.  She was prescribed Bentyl.  Upper endoscopy was performed August 17, 2020.  This was normal post lap band procedure.  No complicating features.  She subsequently presented to the hospital emergency room 15th 2021 with abdominal pain and bleeding.  Seen in the office by my partner Dr. Carlean Purl.  Subsequently set up for complete colonoscopy which was performed September 21, 2020.  Examination revealed very subtle patchy erythema in segmental fashion in the sigmoid colon.  Examination, including intubation of the terminal ileum, was normal.  She was felt to possibly have a mild self-limited colitis.  Biopsies revealed superficial extravasation of red blood cells without significant pathologic change.  She presents today for follow-up, having had this appointment previously.  No further problems with pain or bleeding.  Only complaint is decreased appetite.  REVIEW OF SYSTEMS:  All non-GI ROS negative.  Past Medical History:  Diagnosis Date  . Anginal pain ([place])   . Asthma   . Gastroparesis   . Hypertension   . Migraine headache   . 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 Neurogenic bladder   . Neurogenic bowel   . Obesity   . PCOS (polycystic ovarian syndrome)   . Polyneuropathy   . Rectal bleeding 09/19/2020  . Seizures ([place])   . Skin cancer (melanoma) ([facility])    scalp  . Uterine cancer [facility])     Past Surgical History:  Procedure Laterality Date  . ABDOMINAL HYSTERECTOMY    . BARIATRIC SURGERY     lapband  . CESAREAN SECTION    . CHOLECYSTECTOMY    . COLON SURGERY    . COLONOSCOPY  09/21/2020  . DILATION AND CURETTAGE OF UTERUS    . IUD REMOVAL   03/19/2012   denies  . LAPAROSCOPIC GASTRIC BANDING  01/26/08  . SKIN CANCER EXCISION     scalp and radiation    Social History [name]  reports that she has never smoked. She has never used smokeless tobacco. She reports previous drug use. She reports that she does not drink alcohol.  family history includes Alzheimer's disease in her mother; Cancer in an other family member; Colon cancer in her maternal uncle; Colon polyps in her brother; Coronary artery disease in an other family member; Depression in her daughter and mother; Diabetes in her mother; Hyperlipidemia in her mother; Hypertension in her mother; Liver disease in her father; Uterine cancer in her maternal grandmother.  Allergies  Allergen Reactions  . Toradol [Ketorolac Tromethamine] Other (See Comments)    Causes SEIZURES  . Metformin And Related     Diarrhea with higher doses       PHYSICAL EXAMINATION: Vital signs: BP 130/86   Pulse 88   Ht 5\' 3"  (1.6 m)   Wt 209 lb (94.8 kg)   LMP 03/17/2012   BMI 37.02 kg/m   Constitutional: generally well-appearing, no acute distress Psychiatric: alert and oriented x3, cooperative Eyes: extraocular movements intact, anicteric, conjunctiva pink Mouth: oral pharynx moist, no lesions Neck: supple no lymphadenopathy Cardiovascular: heart regular rate and rhythm, no murmur Lungs: clear to auscultation bilaterally Abdomen: soft, nontender, nondistended, no obvious ascites, no peritoneal signs, normal bowel sounds, no organomegaly Rectal: Omitted  Extremities: no clubbing, cyanosis, or lower extremity edema bilaterally Skin: no lesions on visible extremities Neuro: No focal deficits.  Cranial nerves intact  ASSESSMENT:  1.  Recent problems with abdominal pain and rectal bleeding.  Resolved.  Unremarkable CT.  Colonoscopy with very subtle nonspecific erythema in the region of the sigmoid colon.  Unremarkable biopsies.  Otherwise normal exam.  Doing well without recurrent  problems 2.  Status post lap band procedure.  Recent upper endoscopy without significant abnormalities 3.  Status post cholecystectomy 4.  Obesity 5.  MS  PLAN:  1.  I reviewed with her the procedure findings and pathology.  At this point expectant management.  She can resume her care with her regular physicians.  GI follow-up as needed

## 2020-10-04 NOTE — Patient Instructions (Signed)
Please follow up as needed 

## 2020-10-17 ENCOUNTER — Ambulatory Visit: Payer: Medicare HMO | Admitting: Internal Medicine

## 2020-11-13 ENCOUNTER — Emergency Department (HOSPITAL_COMMUNITY)
Admission: EM | Admit: 2020-11-13 | Discharge: 2020-11-13 | Disposition: A | Discharge status: Home / Self Care | Payer: Medicare HMO | Attending: Emergency Medicine | Admitting: Emergency Medicine

## 2020-11-13 ENCOUNTER — Encounter (HOSPITAL_COMMUNITY): Payer: Self-pay | Admitting: *Deleted

## 2020-11-13 ENCOUNTER — Emergency Department (HOSPITAL_COMMUNITY): Payer: Medicare HMO

## 2020-11-13 ENCOUNTER — Other Ambulatory Visit: Payer: Self-pay

## 2020-11-13 DIAGNOSIS — R079 Chest pain, unspecified: Secondary | ICD-10-CM

## 2020-11-13 DIAGNOSIS — R0602 Shortness of breath: Secondary | ICD-10-CM | POA: Diagnosis not present

## 2020-11-13 DIAGNOSIS — J45909 Unspecified asthma, uncomplicated: Secondary | ICD-10-CM | POA: Diagnosis not present

## 2020-11-13 DIAGNOSIS — I10 Essential (primary) hypertension: Secondary | ICD-10-CM | POA: Insufficient documentation

## 2020-11-13 DIAGNOSIS — R21 Rash and other nonspecific skin eruption: Secondary | ICD-10-CM | POA: Diagnosis not present

## 2020-11-13 DIAGNOSIS — Z85828 Personal history of other malignant neoplasm of skin: Secondary | ICD-10-CM | POA: Diagnosis not present

## 2020-11-13 DIAGNOSIS — Z20822 Contact with and (suspected) exposure to covid-19: Secondary | ICD-10-CM | POA: Insufficient documentation

## 2020-11-13 DIAGNOSIS — Z8542 Personal history of malignant neoplasm of other parts of uterus: Secondary | ICD-10-CM | POA: Diagnosis not present

## 2020-11-13 LAB — CBC WITH DIFFERENTIAL/PLATELET
Abs Immature Granulocytes: 0.02 10*3/uL (ref 0.00–0.07)
Basophils Absolute: 0 10*3/uL (ref 0.0–0.1)
Basophils Relative: 0 %
Eosinophils Absolute: 0.2 10*3/uL (ref 0.0–0.5)
Eosinophils Relative: 3 %
HCT: 44.6 % (ref 36.0–46.0)
Hemoglobin: 15.2 g/dL — ABNORMAL HIGH (ref 12.0–15.0)
Immature Granulocytes: 0 %
Lymphocytes Relative: 36 %
Lymphs Abs: 2.9 10*3/uL (ref 0.7–4.0)
MCH: 31.9 pg (ref 26.0–34.0)
MCHC: 34.1 g/dL (ref 30.0–36.0)
MCV: 93.7 fL (ref 80.0–100.0)
Monocytes Absolute: 0.5 10*3/uL (ref 0.1–1.0)
Monocytes Relative: 6 %
Neutro Abs: 4.5 10*3/uL (ref 1.7–7.7)
Neutrophils Relative %: 55 %
Platelets: 401 10*3/uL — ABNORMAL HIGH (ref 150–400)
RBC: 4.76 MIL/uL (ref 3.87–5.11)
RDW: 12.4 % (ref 11.5–15.5)
WBC: 8.2 10*3/uL (ref 4.0–10.5)
nRBC: 0 % (ref 0.0–0.2)

## 2020-11-13 LAB — RESP PANEL BY RT-PCR (FLU A&B, COVID) ARPGX2
Influenza A by PCR: NEGATIVE
Influenza B by PCR: NEGATIVE
SARS Coronavirus 2 by RT PCR: NEGATIVE

## 2020-11-13 LAB — BRAIN NATRIURETIC PEPTIDE: B Natriuretic Peptide: 19.5 pg/mL (ref 0.0–100.0)

## 2020-11-13 LAB — COMPREHENSIVE METABOLIC PANEL
ALT: 32 U/L (ref 0–44)
AST: 24 U/L (ref 15–41)
Albumin: 4.3 g/dL (ref 3.5–5.0)
Alkaline Phosphatase: 56 U/L (ref 38–126)
Anion gap: 9 (ref 5–15)
BUN: 7 mg/dL (ref 6–20)
CO2: 27 mmol/L (ref 22–32)
Calcium: 9.7 mg/dL (ref 8.9–10.3)
Chloride: 101 mmol/L (ref 98–111)
Creatinine, Ser: 0.7 mg/dL (ref 0.44–1.00)
GFR, Estimated: >60 mL/min (ref >60)
Glucose, Bld: 103 mg/dL — ABNORMAL HIGH (ref 70–99)
Potassium: 3.9 mmol/L (ref 3.5–5.1)
Sodium: 137 mmol/L (ref 135–145)
Total Bilirubin: 0.9 mg/dL (ref 0.3–1.2)
Total Protein: 8.2 g/dL — ABNORMAL HIGH (ref 6.5–8.1)

## 2020-11-13 LAB — D-DIMER, QUANTITATIVE: D-Dimer, Quant: 0.31 ug/mL-FEU (ref 0.00–0.50)

## 2020-11-13 LAB — TROPONIN I (HIGH SENSITIVITY): Troponin I (High Sensitivity): <2 ng/L (ref <18)

## 2020-11-13 MED ORDER — SODIUM CHLORIDE 0.9 % IV BOLUS
1000.0000 mL | Freq: Once | INTRAVENOUS | Status: AC
  Administered 2020-11-13: 1000 mL via INTRAVENOUS

## 2020-11-13 MED ORDER — HYDROMORPHONE HCL 1 MG/ML IJ SOLN
1.0000 mg | Freq: Once | INTRAMUSCULAR | Status: AC
  Administered 2020-11-13: 1 mg via INTRAVENOUS
  Filled 2020-11-13: qty 1

## 2020-11-13 MED ORDER — DIPHENHYDRAMINE HCL 25 MG PO CAPS
50.0000 mg | ORAL_CAPSULE | Freq: Once | ORAL | Status: AC
  Administered 2020-11-13: 50 mg via ORAL
  Filled 2020-11-13: qty 2

## 2020-11-13 MED ORDER — HYDROCODONE-ACETAMINOPHEN 5-325 MG PO TABS
2.0000 | ORAL_TABLET | Freq: Once | ORAL | Status: AC
  Administered 2020-11-13: 2 via ORAL
  Filled 2020-11-13: qty 2

## 2020-11-13 MED ORDER — GABAPENTIN 300 MG PO CAPS
1200.0000 mg | ORAL_CAPSULE | Freq: Once | ORAL | Status: AC
  Administered 2020-11-13: 1200 mg via ORAL
  Filled 2020-11-13: qty 4

## 2020-11-13 NOTE — ED Provider Notes (Signed)
[facility] EMERGENCY DEPARTMENT Provider Note   CSN: [id number] Arrival date & time: 11/13/20  1521     History Chief Complaint  Patient presents with  . Fatigue    [name] is a 44 y.o. female.  HPI 45 year old female presents with a chief complaint of chest pain.  Ongoing for about about 2 weeks on and off.  No fevers or vomiting.  Occasional cough.  She also feels short of breath whenever this happens.  It seems to come and go without obvious cause.  Some pedal edema on and off as well.  This is not typical for her with her MS.  Feels like someone is squeezing her around her chest.  She also notes 2 weeks of progressive skin lesions primarily on her legs.  These are painful.   Past Medical History:  Diagnosis Date  . Anginal pain ([place])   . Asthma   . Gastroparesis   . Hypertension   . Migraine headache   . 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 Neurogenic bladder   . Neurogenic bowel   . Obesity   . PCOS (polycystic ovarian syndrome)   . Polyneuropathy   . Rectal bleeding 09/19/2020  . Seizures ([place])   . Skin cancer (melanoma) ([facility])    scalp  . Uterine cancer [facility])     Patient Active Problem List   Diagnosis Date Noted  . Neuropathic pain of both legs 09/16/2013  . Exacerbation of multiple sclerosis ([place]) 08/01/2013  . Multiple sclerosis exacerbation ([place]) 06/30/2013  . MIGRAINE HEADACHE 12/15/2010  . SINUSITIS 01/19/2010  . BRONCHITIS 01/12/2010  . Multiple sclerosis ([place]) 06/16/2009  . ASTHMA, WITH ACUTE EXACERBATION 03/23/2009  . ALLERGIC RHINITIS 02/07/2009  . ASTHMATIC BRONCHITIS, ACUTE 12/17/2008  . OBESITY, MORBID 02/23/2008  . FATTY LIVER DISEASE 01/27/2008  . HYPERLIPIDEMIA 01/19/2008  . Essential hypertension 06/27/2007  . ASTHMA 06/27/2007  . GERD 06/27/2007    Past Surgical History:  Procedure Laterality Date  . ABDOMINAL HYSTERECTOMY    . BARIATRIC SURGERY     lapband  . CESAREAN SECTION    .  CHOLECYSTECTOMY    . COLON SURGERY    . COLONOSCOPY  09/21/2020  . DILATION AND CURETTAGE OF UTERUS    . IUD REMOVAL  03/19/2012   denies  . LAPAROSCOPIC GASTRIC BANDING  01/26/08  . SKIN CANCER EXCISION     scalp and radiation     OB History    Gravida  1   Para  1   Term  1   Preterm      AB      Living  1     SAB  0   IAB      Ectopic  0   Multiple  0   Live Births              Family History  Problem Relation Age of Onset  . Diabetes Mother   . Depression Mother   . Hypertension Mother   . Hyperlipidemia Mother   . Alzheimer's disease Mother        dx in her 50's  . Liver disease Father   . Depression Daughter        bipolar depression  . Coronary artery disease Other   . Cancer Other        ovarian  . Colon polyps Brother        benign  . Uterine cancer Maternal Grandmother   . Colon cancer  Maternal Uncle   . Anesthesia problems Neg Hx   . Esophageal cancer Neg Hx   . Rectal cancer Neg Hx   . Stomach cancer Neg Hx     Social History   Tobacco Use  . Smoking status: Never Smoker  . Smokeless tobacco: Never Used  Vaping Use  . Vaping Use: Never used  Substance Use Topics  . Alcohol use: No  . Drug use: Not Currently    Comment: THC gummies on occ.    Home Medications Prior to Admission medications   Medication Sig Start Date End Date Taking? Authorizing Provider  acetaminophen (TYLENOL) 325 MG tablet Take 650 mg by mouth every 6 (six) hours as needed for mild pain, fever or headache.    [provider]  AIMOVIG 140 MG/ML SOAJ Inject 140 mg into the skin every 30 (thirty) days. 08/30/20   [provider]  atorvastatin (LIPITOR) 20 MG tablet Take 20 mg by mouth daily.    [provider]  baclofen (LIORESAL) 10 MG tablet Take 1 tablet (10 mg total) by mouth 3 (three) times daily. Muscle spasm Patient taking differently: Take 10 mg by mouth 3 (three) times daily as needed for muscle spasms. Muscle spasm   01/30/18   Terald Sleeper, PA-C  diclofenac sodium (VOLTAREN) 1 % GEL Apply 4 g topically 4 (four) times daily. 01/30/18   Terald Sleeper, PA-C  dicyclomine (BENTYL) 20 MG tablet Take one tablet every 4-6 hours as needed 09/20/20   Gatha Mayer, MD  diphenhydrAMINE (BENADRYL) 25 MG tablet Take 1 tablet (25 mg total) by mouth every 6 (six) hours. 03/31/17   Rancour, Annie Main, MD  famotidine (PEPCID) 20 MG tablet Take 1 tablet (20 mg total) by mouth 2 (two) times daily. Patient taking differently: Take 20 mg by mouth as needed.  03/31/17   Rancour, Annie Main, MD  gabapentin (NEURONTIN) 600 MG tablet TAKE 2 TABLETS 4 TIMES A DAY Patient taking differently: Take 1,200 mg by mouth 4 (four) times daily as needed (neuropathy).  03/04/18   Terald Sleeper, PA-C  hydrochlorothiazide (HYDRODIURIL) 25 MG tablet Take 25 mg by mouth daily.    [provider]  HYDROcodone-acetaminophen (NORCO) 10-325 MG tablet Take 1 tablet by mouth every 6 (six) hours as needed for moderate pain.    [provider]  losartan (COZAAR) 50 MG tablet Take 50 mg by mouth daily.    [provider]  metFORMIN (GLUCOPHAGE) 500 MG tablet Take 250 mg by mouth daily.     [provider]  ocrelizumab (OCREVUS) 300 MG/10ML injection Inject 300 mg into the vein every 6 (six) months.    [provider]  omega-3 acid ethyl esters (LOVAZA) 1 g capsule Take 1 g by mouth 2 (two) times daily.    [provider]  ondansetron (ZOFRAN-ODT) 4 MG disintegrating tablet Take 1 tablet (4 mg total) by mouth every 8 (eight) hours as needed for nausea or vomiting. 01/30/18   Terald Sleeper, PA-C  potassium chloride 20 MEQ TBCR Take 20 mEq by mouth daily. 09/16/20   Alfredia Client, PA-C  Probiotic Product (PROBIOTIC PO) Take 1 capsule by mouth daily.    [provider]  temazepam (RESTORIL) 15 MG capsule Take 15 mg by mouth at bedtime as needed for sleep.    [provider]  traZODone (DESYREL) 50  MG tablet Take 50 mg by mouth at bedtime.    [provider]  triamcinolone cream (KENALOG) 0.5 %  Apply 1 application topically daily as needed. Patient taking differently: Apply 1 application topically daily as needed (routine healing).  01/30/18   Terald Sleeper, PA-C    Allergies    Toradol [ketorolac tromethamine], Metformin and related, and Dilaudid [hydromorphone hcl]  Review of Systems   Review of Systems  Constitutional: Negative for fever.  Respiratory: Positive for cough and shortness of breath.   Cardiovascular: Positive for chest pain.  Gastrointestinal: Negative for abdominal pain and vomiting.  Skin: Negative for rash.  All other systems reviewed and are negative.   Physical Exam Updated Vital Signs BP (!) 141/99   Pulse 76   Temp 98.3 F (36.8 C) (Oral)   Resp 17   Ht 5\' 3"  (1.6 m)   Wt 98 kg   LMP 03/17/2012   SpO2 99%   BMI 38.26 kg/m   Physical Exam Vitals and nursing note reviewed.  Constitutional:      Appearance: She is well-developed and well-nourished.  HENT:     Head: Normocephalic and atraumatic.     Right Ear: External ear normal.     Left Ear: External ear normal.     Nose: Nose normal.  Eyes:     General:        Right eye: No discharge.        Left eye: No discharge.  Cardiovascular:     Rate and Rhythm: Normal rate and regular rhythm.     Heart sounds: Normal heart sounds.  Pulmonary:     Effort: Pulmonary effort is normal.     Breath sounds: Normal breath sounds.  Abdominal:     General: There is no distension.     Palpations: Abdomen is soft.     Tenderness: There is no abdominal tenderness.  Skin:    General: Skin is warm and dry.     Findings: Rash present.     Comments: Nonspecific lesions over 2 legs. There are scaly and gray. They are flat. Each one is a different size.  Neurological:     Mental Status: She is alert.  Psychiatric:        Mood and Affect: Mood is not anxious.     ED Results / Procedures /  Treatments   Labs (all labs ordered are listed, but only abnormal results are displayed) Labs Reviewed  COMPREHENSIVE METABOLIC PANEL - Abnormal; Notable for the following components:      Result Value   Glucose, Bld 103 (*)    Total Protein 8.2 (*)    All other components within normal limits  CBC WITH DIFFERENTIAL/PLATELET - Abnormal; Notable for the following components:   Hemoglobin 15.2 (*)    Platelets 401 (*)    All other components within normal limits  RESP PANEL BY RT-PCR (FLU A&B, COVID) ARPGX2  BRAIN NATRIURETIC PEPTIDE  D-DIMER, QUANTITATIVE (NOT AT [facility])  TROPONIN I (HIGH SENSITIVITY)    EKG EKG Interpretation  Date/Time:  Sunday November 13 2020 20:44:36 EST Ventricular Rate:  83 PR Interval:  150 QRS Duration: 92 QT Interval:  391 QTC Calculation: 460 R Axis:   71 Text Interpretation: Sinus rhythm Poor data quality in current ECG precludes serial comparison Confirmed by Sherwood Gambler [phone]) on 11/13/2020 8:47:12 PM   Radiology DG Chest 2 View  Result Date: 11/13/2020 CLINICAL DATA:  Shortness of breath EXAM: CHEST - 2 VIEW COMPARISON:  07/19/2019 FINDINGS: The heart size and mediastinal contours are within normal limits. Both lungs are clear. The visualized skeletal structures are  unremarkable. IMPRESSION: No acute abnormality of the lungs. Electronically Signed   By: Eddie Candle M.D.   On: 11/13/2020 16:18    Procedures Procedures (including critical care time)  Medications Ordered in ED Medications  sodium chloride 0.9 % bolus 1,000 mL (0 mLs Intravenous Stopped 11/13/20 2320)  HYDROmorphone (DILAUDID) injection 1 mg (1 mg Intravenous Given 11/13/20 2158)  diphenhydrAMINE (BENADRYL) capsule 50 mg (50 mg Oral Given 11/13/20 2232)  gabapentin (NEURONTIN) capsule 1,200 mg (1,200 mg Oral Given 11/13/20 2314)  HYDROcodone-acetaminophen (NORCO/VICODIN) 5-325 MG per tablet 2 tablet (2 tablets Oral Given 11/13/20 2314)    ED Course  I have reviewed  the triage vital signs and the nursing notes.  Pertinent labs & imaging results that were available during my care of the patient were reviewed by me and considered in my medical decision making (see chart for details).    MDM Rules/Calculators/A&P                          Patient's work-up is unremarkable.  Atypical chest pain on and off for a couple weeks.  D-dimer, troponin, and ECG are benign.  Patient is complaining of significant pain but also has significant chronic pain.  The rash is nonspecific and I do not think it is of emergent pathology.  Otherwise, there is no weakness associated with all of this and so I think MS flare is unlikely.  She appears stable for discharge home to follow-up with PCP. Final Clinical Impression(s) / ED Diagnoses Final diagnoses:  Nonspecific chest pain  Rash and nonspecific skin eruption    Rx / DC Orders ED Discharge Orders    None       Sherwood Gambler, MD 11/13/20 2323

## 2020-11-13 NOTE — ED Notes (Signed)
Patient verbalizes understanding of discharge instructions. Opportunity for questioning and answers were provided. Armband removed by staff, pt discharged from ED via wheelchair.  

## 2020-11-13 NOTE — ED Notes (Signed)
Patient c/o dilaudid making her itch and requesting something for same. Small amount of redness noted to back and chest, patient scratching areas, no hives/diffuse rash noted. Dr. Regenia Skeeter messaged regarding same.

## 2020-11-13 NOTE — Discharge Instructions (Signed)
If you develop recurrent, continued, or worsening chest pain, shortness of breath, fever, vomiting, abdominal or back pain, or any other new/concerning symptoms then return to the ER for evaluation.  

## 2020-11-13 NOTE — ED Triage Notes (Signed)
The pt has ms and for 2 weeks she has had lesions lover her lower extremities  Small irregular lesions that are irregular in size  She has seen her doctor and she has an appointment to see a dematologist in the coming week.  Today she has been more tired than usual  She feels son sometimes  None at present and chest pain   lmp hyst

## 2020-11-24 ENCOUNTER — Other Ambulatory Visit: Payer: Self-pay

## 2020-11-24 ENCOUNTER — Ambulatory Visit (HOSPITAL_COMMUNITY)
Admission: RE | Admit: 2020-11-24 | Discharge: 2020-11-24 | Disposition: A | Discharge status: Home / Self Care | Payer: Medicare HMO | Source: Ambulatory Visit | Attending: Internal Medicine | Admitting: Internal Medicine

## 2020-11-24 DIAGNOSIS — G35 Multiple sclerosis: Secondary | ICD-10-CM | POA: Diagnosis not present

## 2020-11-24 MED ORDER — METHYLPREDNISOLONE SODIUM SUCC 125 MG IJ SOLR
100.0000 mg | Freq: Once | INTRAMUSCULAR | Status: AC
  Administered 2020-11-24: 100 mg via INTRAVENOUS
  Filled 2020-11-24: qty 2

## 2020-11-24 MED ORDER — ACETAMINOPHEN 325 MG PO TABS
975.0000 mg | ORAL_TABLET | Freq: Once | ORAL | Status: AC
  Administered 2020-11-24: 975 mg via ORAL
  Filled 2020-11-24: qty 3

## 2020-11-24 MED ORDER — SODIUM CHLORIDE 0.9 % IV SOLN
8.0000 mg | Freq: Once | INTRAVENOUS | Status: AC
  Administered 2020-11-24: 8 mg via INTRAVENOUS
  Filled 2020-11-24: qty 4

## 2020-11-24 MED ORDER — DIPHENHYDRAMINE HCL 50 MG/ML IJ SOLN: 25.0000 mg | Freq: Once | INTRAMUSCULAR | Status: DC

## 2020-11-24 MED ORDER — SODIUM CHLORIDE 0.9 % IV SOLN
INTRAVENOUS | Status: DC | PRN
  Administered 2020-11-24: 250 mL via INTRAVENOUS

## 2020-11-24 MED ORDER — DIPHENHYDRAMINE HCL 50 MG/ML IJ SOLN
25.0000 mg | Freq: Once | INTRAMUSCULAR | Status: AC
  Administered 2020-11-24: 25 mg via INTRAVENOUS
  Filled 2020-11-24: qty 1

## 2020-11-24 MED ORDER — SODIUM CHLORIDE 0.9 % IV SOLN
600.0000 mg | Freq: Once | INTRAVENOUS | Status: AC
  Administered 2020-11-24: 600 mg via INTRAVENOUS
  Filled 2020-11-24: qty 20

## 2020-11-24 NOTE — Discharge Instructions (Signed)
Ocrelizumab injection °What is this medicine? °OCRELIZUMAB (ok re LIZ ue mab) treats multiple sclerosis. It helps to decrease the number of multiple sclerosis relapses. It is not a cure. °This medicine may be used for other purposes; ask your health care provider or pharmacist if you have questions. °COMMON BRAND NAME(S): OCREVUS °What should I tell my health care provider before I take this medicine? °They need to know if you have any of these conditions: °· cancer °· hepatitis B infection °· other infection (especially a virus infection such as chickenpox, cold sores, or herpes) °· an unusual or allergic reaction to ocrelizumab, other medicines, foods, dyes or preservatives °· pregnant or trying to get pregnant °· breast-feeding °How should I use this medicine? °This medicine is for infusion into a vein. It is given by a health care professional in a hospital or clinic setting. °A special MedGuide will be given to you before each treatment. Be sure to read this information carefully each time. °Talk to your pediatrician regarding the use of this medicine in children. Special care may be needed. °Overdosage: If you think you have taken too much of this medicine contact a poison control center or emergency room at once. °NOTE: This medicine is only for you. Do not share this medicine with others. °What if I miss a dose? °Keep appointments for follow-up doses as directed. It is important not to miss your dose. Call your doctor or health care professional if you are unable to keep an appointment. °What may interact with this medicine? °· alemtuzumab °· daclizumab °· dimethyl fumarate °· fingolimod °· glatiramer °· interferon beta °· live virus vaccines °· mitoxantrone °· natalizumab °· peginterferon beta °· rituximab °· steroid medicines like prednisone or cortisone °· teriflunomide °This list may not describe all possible interactions. Give your health care provider a list of all the medicines, herbs,  non-prescription drugs, or dietary supplements you use. Also tell them if you smoke, drink alcohol, or use illegal drugs. Some items may interact with your medicine. °What should I watch for while using this medicine? °Tell your doctor or healthcare professional if your symptoms do not start to get better or if they get worse. °This medicine can cause serious allergic reactions. To reduce your risk you may need to take medicine before treatment with this medicine. Take your medicine as directed. °Women should inform their doctor if they wish to become pregnant or think they might be pregnant. There is a potential for serious side effects to an unborn child. Talk to your health care professional or pharmacist for more information. Female patients should use effective birth control methods while receiving this medicine and for 6 months after the last dose. °Call your doctor or health care professional for advice if you get a fever, chills or sore throat, or other symptoms of a cold or flu. Do not treat yourself. This drug decreases your body's ability to fight infections. Try to avoid being around people who are sick. °If you have a hepatitis B infection or a history of a hepatitis B infection, talk to your doctor. The symptoms of hepatitis B may get worse if you take this medicine. °In some patients, this medicine may cause a serious brain infection that may cause death. If you have any problems seeing, thinking, speaking, walking, or standing, tell your doctor right away. If you cannot reach your doctor, urgently seek other source of medical care. °This medicine can decrease the response to a vaccine. If you need to get   vaccinated, tell your healthcare professional if you have received this medicine. Extra booster doses may be needed. Talk to your doctor to see if a different vaccination schedule is needed. °Talk to your doctor about your risk of cancer. You may be more at risk for certain types of cancers if you  take this medicine. °What side effects may I notice from receiving this medicine? °Side effects that you should report to your doctor or health care professional as soon as possible: °· allergic reactions like skin rash, itching or hives, swelling of the face, lips, or tongue °· breathing problems °· facial flushing °· fast, irregular heartbeat °· lump or soreness in the breast °· signs and symptoms of herpes such as cold sore, shingles, or genital sores °· signs and symptoms of infection like fever or chills, cough, sore throat, pain or trouble passing urine °· signs and symptoms of low blood pressure like dizziness; feeling faint or lightheaded, falls; unusually weak or tired °· signs and symptoms of progressive multifocal leukoencephalopathy (PML) like changes in vision; clumsiness; confusion; personality changes; weakness on one side of the body °· swelling of the ankles, feet, hands °Side effects that usually do not require medical attention (report these to your doctor or health care professional if they continue or are bothersome): °· back pain °· depressed mood °· diarrhea °· pain, redness, or irritation at site where injected °This list may not describe all possible side effects. Call your doctor for medical advice about side effects. You may report side effects to FDA at 1-800-FDA-1088. °Where should I keep my medicine? °This drug is given in a hospital or clinic and will not be stored at home. °NOTE: This sheet is a summary. It may not cover all possible information. If you have questions about this medicine, talk to your doctor, pharmacist, or health care provider. °© 2020 Elsevier/Gold Standard (2018-11-24 07:41:53) ° °

## 2020-11-24 NOTE — Progress Notes (Signed)
Patient received Ocrevus via PIV. Pre infusion medications - IV Benadryl, PO Tylenol and IV Solumedrol, IV Zofran were given. Pre infusion BP was 147/99. Effie Shy MD was contacted. Per MD, RN should proceed with infusion. Post infusion BP was 112/76. Tolerated well, vitals stable, discharge instructions given, verbalized understanding. Patient alert, oriented and ambulatory at discharge.

## 2020-12-15 DIAGNOSIS — K219 Gastro-esophageal reflux disease without esophagitis: Secondary | ICD-10-CM | POA: Diagnosis not present

## 2020-12-15 DIAGNOSIS — F909 Attention-deficit hyperactivity disorder, unspecified type: Secondary | ICD-10-CM | POA: Diagnosis not present

## 2020-12-15 DIAGNOSIS — F419 Anxiety disorder, unspecified: Secondary | ICD-10-CM | POA: Diagnosis not present

## 2020-12-15 DIAGNOSIS — R69 Illness, unspecified: Secondary | ICD-10-CM | POA: Diagnosis not present

## 2020-12-15 DIAGNOSIS — Z79891 Long term (current) use of opiate analgesic: Secondary | ICD-10-CM | POA: Diagnosis not present

## 2020-12-15 DIAGNOSIS — Z7984 Long term (current) use of oral hypoglycemic drugs: Secondary | ICD-10-CM | POA: Diagnosis not present

## 2020-12-15 DIAGNOSIS — G8929 Other chronic pain: Secondary | ICD-10-CM | POA: Diagnosis not present

## 2020-12-15 DIAGNOSIS — E1142 Type 2 diabetes mellitus with diabetic polyneuropathy: Secondary | ICD-10-CM | POA: Diagnosis not present

## 2020-12-15 DIAGNOSIS — I10 Essential (primary) hypertension: Secondary | ICD-10-CM | POA: Diagnosis not present

## 2020-12-15 DIAGNOSIS — E785 Hyperlipidemia, unspecified: Secondary | ICD-10-CM | POA: Diagnosis not present

## 2020-12-16 DIAGNOSIS — E119 Type 2 diabetes mellitus without complications: Secondary | ICD-10-CM | POA: Diagnosis not present

## 2020-12-16 DIAGNOSIS — G4700 Insomnia, unspecified: Secondary | ICD-10-CM | POA: Diagnosis not present

## 2020-12-16 DIAGNOSIS — D849 Immunodeficiency, unspecified: Secondary | ICD-10-CM | POA: Diagnosis not present

## 2020-12-16 DIAGNOSIS — K219 Gastro-esophageal reflux disease without esophagitis: Secondary | ICD-10-CM | POA: Diagnosis not present

## 2020-12-16 DIAGNOSIS — R69 Illness, unspecified: Secondary | ICD-10-CM | POA: Diagnosis not present

## 2020-12-16 DIAGNOSIS — Z87898 Personal history of other specified conditions: Secondary | ICD-10-CM | POA: Diagnosis not present

## 2020-12-16 DIAGNOSIS — M792 Neuralgia and neuritis, unspecified: Secondary | ICD-10-CM | POA: Diagnosis not present

## 2020-12-16 DIAGNOSIS — G35 Multiple sclerosis: Secondary | ICD-10-CM | POA: Diagnosis not present

## 2020-12-16 DIAGNOSIS — D72819 Decreased white blood cell count, unspecified: Secondary | ICD-10-CM | POA: Diagnosis not present

## 2020-12-19 DIAGNOSIS — E119 Type 2 diabetes mellitus without complications: Secondary | ICD-10-CM | POA: Diagnosis not present

## 2020-12-20 ENCOUNTER — Other Ambulatory Visit: Payer: Self-pay | Admitting: Psychiatry

## 2020-12-20 ENCOUNTER — Other Ambulatory Visit (HOSPITAL_COMMUNITY): Payer: Self-pay | Admitting: Psychiatry

## 2020-12-20 DIAGNOSIS — G35 Multiple sclerosis: Secondary | ICD-10-CM

## 2020-12-27 ENCOUNTER — Ambulatory Visit (HOSPITAL_COMMUNITY)
Admission: RE | Admit: 2020-12-27 | Discharge: 2020-12-27 | Disposition: A | Discharge status: Home / Self Care | Payer: Medicare HMO | Source: Ambulatory Visit | Attending: Psychiatry | Admitting: Psychiatry

## 2020-12-27 ENCOUNTER — Other Ambulatory Visit: Payer: Self-pay

## 2020-12-27 DIAGNOSIS — G9389 Other specified disorders of brain: Secondary | ICD-10-CM | POA: Diagnosis not present

## 2020-12-27 DIAGNOSIS — G379 Demyelinating disease of central nervous system, unspecified: Secondary | ICD-10-CM | POA: Diagnosis not present

## 2020-12-27 DIAGNOSIS — G35 Multiple sclerosis: Secondary | ICD-10-CM | POA: Insufficient documentation

## 2020-12-27 MED ORDER — GADOBUTROL 1 MMOL/ML IV SOLN
10.0000 mL | Freq: Once | INTRAVENOUS | Status: AC | PRN
  Administered 2020-12-27: 10 mL via INTRAVENOUS

## 2021-01-19 DIAGNOSIS — G35 Multiple sclerosis: Secondary | ICD-10-CM | POA: Diagnosis not present

## 2021-01-19 DIAGNOSIS — Z87898 Personal history of other specified conditions: Secondary | ICD-10-CM | POA: Diagnosis not present

## 2021-01-19 DIAGNOSIS — R69 Illness, unspecified: Secondary | ICD-10-CM | POA: Diagnosis not present

## 2021-01-19 DIAGNOSIS — D849 Immunodeficiency, unspecified: Secondary | ICD-10-CM | POA: Diagnosis not present

## 2021-01-19 DIAGNOSIS — G4700 Insomnia, unspecified: Secondary | ICD-10-CM | POA: Diagnosis not present

## 2021-01-19 DIAGNOSIS — E119 Type 2 diabetes mellitus without complications: Secondary | ICD-10-CM | POA: Diagnosis not present

## 2021-01-19 DIAGNOSIS — D72819 Decreased white blood cell count, unspecified: Secondary | ICD-10-CM | POA: Diagnosis not present

## 2021-01-19 DIAGNOSIS — M792 Neuralgia and neuritis, unspecified: Secondary | ICD-10-CM | POA: Diagnosis not present

## 2021-03-16 DIAGNOSIS — E119 Type 2 diabetes mellitus without complications: Secondary | ICD-10-CM | POA: Diagnosis not present

## 2021-03-16 DIAGNOSIS — I10 Essential (primary) hypertension: Secondary | ICD-10-CM | POA: Diagnosis not present

## 2021-03-16 DIAGNOSIS — R7401 Elevation of levels of liver transaminase levels: Secondary | ICD-10-CM | POA: Diagnosis not present

## 2021-03-16 DIAGNOSIS — E663 Overweight: Secondary | ICD-10-CM | POA: Diagnosis not present

## 2021-03-16 DIAGNOSIS — E782 Mixed hyperlipidemia: Secondary | ICD-10-CM | POA: Diagnosis not present

## 2021-03-16 DIAGNOSIS — Z0001 Encounter for general adult medical examination with abnormal findings: Secondary | ICD-10-CM | POA: Diagnosis not present

## 2021-04-29 ENCOUNTER — Emergency Department (HOSPITAL_COMMUNITY): Payer: Medicare HMO

## 2021-04-29 ENCOUNTER — Emergency Department (HOSPITAL_COMMUNITY)
Admission: EM | Admit: 2021-04-29 | Discharge: 2021-04-29 | Disposition: A | Discharge status: Home / Self Care | Payer: Medicare HMO | Attending: Emergency Medicine | Admitting: Emergency Medicine

## 2021-04-29 ENCOUNTER — Ambulatory Visit (HOSPITAL_COMMUNITY): Payer: Self-pay

## 2021-04-29 ENCOUNTER — Other Ambulatory Visit: Payer: Self-pay

## 2021-04-29 DIAGNOSIS — R0602 Shortness of breath: Secondary | ICD-10-CM | POA: Diagnosis not present

## 2021-04-29 DIAGNOSIS — M791 Myalgia, unspecified site: Secondary | ICD-10-CM | POA: Diagnosis not present

## 2021-04-29 DIAGNOSIS — J45901 Unspecified asthma with (acute) exacerbation: Secondary | ICD-10-CM | POA: Diagnosis not present

## 2021-04-29 DIAGNOSIS — Z79899 Other long term (current) drug therapy: Secondary | ICD-10-CM | POA: Insufficient documentation

## 2021-04-29 DIAGNOSIS — R000 Tachycardia, unspecified: Secondary | ICD-10-CM | POA: Diagnosis not present

## 2021-04-29 DIAGNOSIS — R112 Nausea with vomiting, unspecified: Secondary | ICD-10-CM | POA: Diagnosis not present

## 2021-04-29 DIAGNOSIS — I10 Essential (primary) hypertension: Secondary | ICD-10-CM | POA: Insufficient documentation

## 2021-04-29 DIAGNOSIS — Z85828 Personal history of other malignant neoplasm of skin: Secondary | ICD-10-CM | POA: Diagnosis not present

## 2021-04-29 DIAGNOSIS — R079 Chest pain, unspecified: Secondary | ICD-10-CM | POA: Insufficient documentation

## 2021-04-29 DIAGNOSIS — Z20822 Contact with and (suspected) exposure to covid-19: Secondary | ICD-10-CM | POA: Insufficient documentation

## 2021-04-29 DIAGNOSIS — R059 Cough, unspecified: Secondary | ICD-10-CM | POA: Diagnosis not present

## 2021-04-29 DIAGNOSIS — R509 Fever, unspecified: Secondary | ICD-10-CM | POA: Insufficient documentation

## 2021-04-29 LAB — URINALYSIS, ROUTINE W REFLEX MICROSCOPIC
Bilirubin Urine: NEGATIVE
Glucose, UA: NEGATIVE mg/dL
Hgb urine dipstick: NEGATIVE
Ketones, ur: NEGATIVE mg/dL
Leukocytes,Ua: NEGATIVE
Nitrite: NEGATIVE
Protein, ur: NEGATIVE mg/dL
Specific Gravity, Urine: 1.008 (ref 1.005–1.030)
pH: 5 (ref 5.0–8.0)

## 2021-04-29 LAB — COMPREHENSIVE METABOLIC PANEL
ALT: 57 U/L — ABNORMAL HIGH (ref 0–44)
AST: 30 U/L (ref 15–41)
Albumin: 4 g/dL (ref 3.5–5.0)
Alkaline Phosphatase: 57 U/L (ref 38–126)
Anion gap: 8 (ref 5–15)
BUN: 6 mg/dL (ref 6–20)
CO2: 25 mmol/L (ref 22–32)
Calcium: 9.1 mg/dL (ref 8.9–10.3)
Chloride: 105 mmol/L (ref 98–111)
Creatinine, Ser: 0.58 mg/dL (ref 0.44–1.00)
GFR, Estimated: >60 mL/min (ref >60)
Glucose, Bld: 152 mg/dL — ABNORMAL HIGH (ref 70–99)
Potassium: 4.1 mmol/L (ref 3.5–5.1)
Sodium: 138 mmol/L (ref 135–145)
Total Bilirubin: 0.4 mg/dL (ref 0.3–1.2)
Total Protein: 7.3 g/dL (ref 6.5–8.1)

## 2021-04-29 LAB — CBC WITH DIFFERENTIAL/PLATELET
Abs Immature Granulocytes: 0.04 10*3/uL (ref 0.00–0.07)
Basophils Absolute: 0 10*3/uL (ref 0.0–0.1)
Basophils Relative: 0 %
Eosinophils Absolute: 0.4 10*3/uL (ref 0.0–0.5)
Eosinophils Relative: 4 %
HCT: 45.4 % (ref 36.0–46.0)
Hemoglobin: 15.6 g/dL — ABNORMAL HIGH (ref 12.0–15.0)
Immature Granulocytes: 0 %
Lymphocytes Relative: 22 %
Lymphs Abs: 2.5 10*3/uL (ref 0.7–4.0)
MCH: 32.2 pg (ref 26.0–34.0)
MCHC: 34.4 g/dL (ref 30.0–36.0)
MCV: 93.8 fL (ref 80.0–100.0)
Monocytes Absolute: 1.1 10*3/uL — ABNORMAL HIGH (ref 0.1–1.0)
Monocytes Relative: 9 %
Neutro Abs: 7.6 10*3/uL (ref 1.7–7.7)
Neutrophils Relative %: 65 %
Platelets: 340 10*3/uL (ref 150–400)
RBC: 4.84 MIL/uL (ref 3.87–5.11)
RDW: 12.2 % (ref 11.5–15.5)
WBC: 11.6 10*3/uL — ABNORMAL HIGH (ref 4.0–10.5)
nRBC: 0 % (ref 0.0–0.2)

## 2021-04-29 LAB — LIPASE, BLOOD: Lipase: 30 U/L (ref 11–51)

## 2021-04-29 LAB — RESP PANEL BY RT-PCR (FLU A&B, COVID) ARPGX2
Influenza A by PCR: NEGATIVE
Influenza B by PCR: NEGATIVE
SARS Coronavirus 2 by RT PCR: NEGATIVE

## 2021-04-29 LAB — TROPONIN I (HIGH SENSITIVITY): Troponin I (High Sensitivity): <2 ng/L (ref <18)

## 2021-04-29 MED ORDER — DOXYCYCLINE HYCLATE 100 MG PO CAPS: 100.0000 mg | ORAL_CAPSULE | Freq: Two times a day (BID) | ORAL | 0 refills | Status: AC

## 2021-04-29 MED ORDER — SODIUM CHLORIDE 0.9 % IV BOLUS
1000.0000 mL | Freq: Once | INTRAVENOUS | Status: AC
  Administered 2021-04-29: 1000 mL via INTRAVENOUS

## 2021-04-29 MED ORDER — LOSARTAN POTASSIUM 25 MG PO TABS
50.0000 mg | ORAL_TABLET | Freq: Once | ORAL | Status: AC
  Administered 2021-04-29: 50 mg via ORAL
  Filled 2021-04-29: qty 2

## 2021-04-29 MED ORDER — DIPHENHYDRAMINE HCL 50 MG/ML IJ SOLN
12.5000 mg | Freq: Once | INTRAMUSCULAR | Status: AC
  Administered 2021-04-29: 12.5 mg via INTRAVENOUS
  Filled 2021-04-29: qty 1

## 2021-04-29 MED ORDER — HYDROCHLOROTHIAZIDE 12.5 MG PO CAPS
25.0000 mg | ORAL_CAPSULE | Freq: Once | ORAL | Status: AC
  Administered 2021-04-29: 25 mg via ORAL
  Filled 2021-04-29: qty 2

## 2021-04-29 MED ORDER — METOCLOPRAMIDE HCL 5 MG/ML IJ SOLN
5.0000 mg | Freq: Once | INTRAMUSCULAR | Status: AC
  Administered 2021-04-29: 5 mg via INTRAVENOUS
  Filled 2021-04-29: qty 2

## 2021-04-29 MED ORDER — GABAPENTIN 300 MG PO CAPS
600.0000 mg | ORAL_CAPSULE | Freq: Once | ORAL | Status: AC
  Administered 2021-04-29: 600 mg via ORAL
  Filled 2021-04-29: qty 2

## 2021-04-29 MED ORDER — BENZONATATE 100 MG PO CAPS: 100.0000 mg | ORAL_CAPSULE | Freq: Three times a day (TID) | ORAL | 0 refills | Status: AC

## 2021-04-29 MED ORDER — BACLOFEN 10 MG PO TABS
10.0000 mg | ORAL_TABLET | Freq: Three times a day (TID) | ORAL | Status: DC
  Administered 2021-04-29: 10 mg via ORAL
  Filled 2021-04-29: qty 1

## 2021-04-29 MED ORDER — BENZONATATE 100 MG PO CAPS
100.0000 mg | ORAL_CAPSULE | Freq: Once | ORAL | Status: AC
  Administered 2021-04-29: 100 mg via ORAL
  Filled 2021-04-29: qty 1

## 2021-04-29 MED ORDER — MORPHINE SULFATE (PF) 4 MG/ML IV SOLN
4.0000 mg | Freq: Once | INTRAVENOUS | Status: AC
  Administered 2021-04-29: 4 mg via INTRAVENOUS
  Filled 2021-04-29: qty 1

## 2021-04-29 MED ORDER — METOCLOPRAMIDE HCL 10 MG PO TABS: 10.0000 mg | ORAL_TABLET | Freq: Four times a day (QID) | ORAL | 0 refills | Status: DC

## 2021-04-29 MED ORDER — ACETAMINOPHEN 325 MG PO TABS
650.0000 mg | ORAL_TABLET | Freq: Once | ORAL | Status: AC
  Administered 2021-04-29: 650 mg via ORAL
  Filled 2021-04-29: qty 2

## 2021-04-29 NOTE — Discharge Instructions (Signed)
Take reglan as directed for nausea and vomiting and take tessalon as directed for cough. You were given a prescription for antibiotics for a possible pneumonia. Please take the antibiotic prescription fully.   Please follow up with your primary care provider within 5-7 days for re-evaluation of your symptoms. If you do not have a primary care provider, information for a healthcare clinic has been provided for you to make arrangements for follow up care. Please return to the emergency department for any new or worsening symptoms.

## 2021-04-29 NOTE — ED Provider Notes (Signed)
[facility] DEPT Provider Note   CSN: [id number] Arrival date & time: 04/29/21  1327     History Chief Complaint  Patient presents with  . Generalized Body Aches  . Cough    [name] is a 45 y.o. female.  HPI    Patient is a 45 year old female history of anginal pain, asthma, gastroparesis, hypertension, MS, neurogenic bladder, neurogenic bowel, PCOS, polyneuropathy, seizures, skin cancer, uterine cancer, who presents to the emergency department today for evaluation of cough, shortness of breath, body aches, fevers nausea and vomiting that started this week.  She states she also has some chest pain with cough only.  She denies any abdominal pain and denies any diarrhea.  She states that due to the vomiting she has been unable to take her medication for her MS.  While in the waiting room patient states that she started to lose sensation in her perineal area and have paresthesias to her bilateral feet.  She had an episode of urinary incontinence.  She states that this often happens to her when she has been off of her MS medications.   Past Medical History:  Diagnosis Date  . Anginal pain ([place])   . Asthma   . Gastroparesis   . Hypertension   . Migraine headache   . 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 Neurogenic bladder   . Neurogenic bowel   . Obesity   . PCOS (polycystic ovarian syndrome)   . Polyneuropathy   . Rectal bleeding 09/19/2020  . Seizures ([place])   . Skin cancer (melanoma) ([facility])    scalp  . Uterine cancer [facility])     Patient Active Problem List   Diagnosis Date Noted  . Neuropathic pain of both legs 09/16/2013  . Exacerbation of multiple sclerosis ([place]) 08/01/2013  . Multiple sclerosis exacerbation ([place]) 06/30/2013  . MIGRAINE HEADACHE 12/15/2010  . SINUSITIS 01/19/2010  . BRONCHITIS 01/12/2010  . Multiple sclerosis ([place]) 06/16/2009  . ASTHMA, WITH ACUTE EXACERBATION 03/23/2009  . ALLERGIC RHINITIS 02/07/2009   . ASTHMATIC BRONCHITIS, ACUTE 12/17/2008  . OBESITY, MORBID 02/23/2008  . FATTY LIVER DISEASE 01/27/2008  . HYPERLIPIDEMIA 01/19/2008  . Essential hypertension 06/27/2007  . ASTHMA 06/27/2007  . GERD 06/27/2007    Past Surgical History:  Procedure Laterality Date  . ABDOMINAL HYSTERECTOMY    . BARIATRIC SURGERY     lapband  . CESAREAN SECTION    . CHOLECYSTECTOMY    . COLON SURGERY    . COLONOSCOPY  09/21/2020  . DILATION AND CURETTAGE OF UTERUS    . IUD REMOVAL  03/19/2012   denies  . LAPAROSCOPIC GASTRIC BANDING  01/26/08  . SKIN CANCER EXCISION     scalp and radiation     OB History    Gravida  1   Para  1   Term  1   Preterm      AB      Living  1     SAB  0   IAB      Ectopic  0   Multiple  0   Live Births              Family History  Problem Relation Age of Onset  . Diabetes Mother   . Depression Mother   . Hypertension Mother   . Hyperlipidemia Mother   . Alzheimer's disease Mother        dx in her 2's  . Liver disease Father   . Depression Daughter  bipolar depression  . Coronary artery disease Other   . Cancer Other        ovarian  . Colon polyps Brother        benign  . Uterine cancer Maternal Grandmother   . Colon cancer Maternal Uncle   . Anesthesia problems Neg Hx   . Esophageal cancer Neg Hx   . Rectal cancer Neg Hx   . Stomach cancer Neg Hx     Social History   Tobacco Use  . Smoking status: Never Smoker  . Smokeless tobacco: Never Used  Vaping Use  . Vaping Use: Never used  Substance Use Topics  . Alcohol use: No  . Drug use: Not Currently    Comment: THC gummies on occ.    Home Medications Prior to Admission medications   Medication Sig Start Date End Date Taking? Authorizing Provider  acetaminophen (TYLENOL) 325 MG tablet Take 650 mg by mouth every 6 (six) hours as needed for mild pain, fever or headache.   Yes [provider]  amphetamine-dextroamphetamine (ADDERALL) 20 MG tablet Take 20  mg by mouth daily as needed (to help focus/ADHD). 01/19/21  Yes [provider]  atorvastatin (LIPITOR) 20 MG tablet Take 20 mg by mouth every morning.   Yes [provider]  baclofen (LIORESAL) 10 MG tablet Take 1 tablet (10 mg total) by mouth 3 (three) times daily. Muscle spasm Patient taking differently: Take 10 mg by mouth 3 (three) times daily as needed (tremors/spasms). 01/30/18  Yes Terald Sleeper, PA-C  benzonatate (TESSALON) 100 MG capsule Take 1 capsule (100 mg total) by mouth every 8 (eight) hours for 5 days. 04/29/21 05/04/21 Yes Marquette Blodgett S, PA-C  clobetasol ointment (TEMOVATE) 7.85 % Apply 1 application topically 2 (two) times daily. For lichen planus 07[birth year]  Yes [provider]  diclofenac sodium (VOLTAREN) 1 % GEL Apply 4 g topically 4 (four) times daily. Patient taking differently: Apply 4 g topically 2 (two) times daily. 01/30/18  Yes Terald Sleeper, PA-C  diphenhydrAMINE (BENADRYL) 25 MG tablet Take 1 tablet (25 mg total) by mouth every 6 (six) hours. Patient taking differently: Take 25 mg by mouth 2 (two) times daily as needed (with each hydrocodone dose (for itching)). 03/31/17  Yes Rancour, Annie Main, MD  DM-Doxylamine-Acetaminophen (NYQUIL COLD & FLU PO) Take 1 Dose by mouth at bedtime as needed (cough).   Yes [provider]  doxycycline (VIBRAMYCIN) 100 MG capsule Take 1 capsule (100 mg total) by mouth 2 (two) times daily for 7 days. 04/29/21 05/06/21 Yes Forney Kleinpeter S, PA-C  Erenumab-aooe (AIMOVIG, 140 MG DOSE,) 70 MG/ML SOAJ Inject 140 mg into the skin every 30 (thirty) days. On the 18th of each month   Yes [provider]  gabapentin (NEURONTIN) 600 MG tablet TAKE 2 TABLETS 4 TIMES A DAY Patient taking differently: Take 1,200 mg by mouth 4 (four) times daily. 03/04/18  Yes Terald Sleeper, PA-C  hydrochlorothiazide (HYDRODIURIL) 25 MG tablet Take 25 mg by mouth every morning.   Yes [provider]  HYDROcodone-acetaminophen  (NORCO) 10-325 MG tablet Take 1 tablet by mouth every 6 (six) hours as needed for moderate pain.   Yes [provider]  losartan (COZAAR) 50 MG tablet Take 50 mg by mouth every morning.   Yes [provider]  metFORMIN (GLUCOPHAGE-XR) 500 MG 24 hr tablet Take 250 mg by mouth every morning. 02/03/21  Yes [provider]  metoCLOPramide (REGLAN) 10 MG tablet Take 1 tablet (  10 mg total) by mouth every 6 (six) hours. 04/29/21  Yes Bryndan Bilyk S, PA-C  ocrelizumab (OCREVUS) 300 MG/10ML injection Inject 300 mg into the vein every 6 (six) months.   Yes [provider]  omega-3 acid ethyl esters (LOVAZA) 1 g capsule Take 1 g by mouth 2 (two) times daily.   Yes [provider]  omeprazole (PRILOSEC) 20 MG capsule Take 20 mg by mouth every morning. 12/16/20  Yes [provider]  ondansetron (ZOFRAN-ODT) 4 MG disintegrating tablet Take 1 tablet (4 mg total) by mouth every 8 (eight) hours as needed for nausea or vomiting. 01/30/18  Yes Terald Sleeper, PA-C  potassium chloride 20 MEQ TBCR Take 20 mEq by mouth daily. 09/16/20  Yes Alfredia Client, PA-C  Probiotic Product (PROBIOTIC PO) Take 1 capsule by mouth every morning.   Yes [provider]  traZODone (DESYREL) 50 MG tablet Take 50 mg by mouth at bedtime.   Yes [provider]  dicyclomine (BENTYL) 20 MG tablet Take one tablet every 4-6 hours as needed Patient not taking: No sig reported 09/20/20   Gatha Mayer, MD  famotidine (PEPCID) 20 MG tablet Take 1 tablet (20 mg total) by mouth 2 (two) times daily. Patient not taking: No sig reported 03/31/17   Rancour, Annie Main, MD  triamcinolone cream (KENALOG) 0.5 % Apply 1 application topically daily as needed. Patient not taking: No sig reported 01/30/18   Terald Sleeper, PA-C    Allergies    Toradol [ketorolac tromethamine], Metformin and related, and Dilaudid [hydromorphone hcl]  Review of Systems   Review of Systems  Constitutional:  Positive for chills and fever.  HENT: Negative for ear pain and sore throat.   Eyes: Negative for pain and visual disturbance.  Respiratory: Positive for cough and shortness of breath.   Cardiovascular: Negative for chest pain.  Gastrointestinal: Positive for abdominal pain, nausea and vomiting. Negative for constipation and diarrhea.  Genitourinary: Negative for dysuria and hematuria.  Musculoskeletal: Negative for back pain.  Skin: Negative for rash.  Neurological: Negative for syncope.  All other systems reviewed and are negative.   Physical Exam Updated Vital Signs BP 131/79   Pulse 93   Temp 98.6 F (37 C) (Oral)   Resp 17   Ht 5\' 3"  (1.6 m)   Wt 98.4 kg   LMP 03/17/2012   SpO2 100%   BMI 38.44 kg/m   Physical Exam Vitals and nursing note reviewed.  Constitutional:      General: She is not in acute distress.    Appearance: She is well-developed.  HENT:     Head: Normocephalic and atraumatic.  Eyes:     Conjunctiva/sclera: Conjunctivae normal.  Cardiovascular:     Rate and Rhythm: Regular rhythm. Tachycardia present.     Heart sounds: No murmur heard.   Pulmonary:     Effort: Pulmonary effort is normal. No respiratory distress.     Breath sounds: Normal breath sounds. No wheezing, rhonchi or rales.  Abdominal:     General: Bowel sounds are normal.     Palpations: Abdomen is soft.     Tenderness: There is no abdominal tenderness. There is no guarding or rebound.  Musculoskeletal:     Cervical back: Neck supple.     Comments: Patient has some decreased range to the bilateral lower extremities with paresthesias to the bilateral feet, there is normal sensation elsewhere  Skin:    General: Skin is warm and dry.  Neurological:  Mental Status: She is alert.     ED Results / Procedures / Treatments   Labs (all labs ordered are listed, but only abnormal results are displayed) Labs Reviewed  COMPREHENSIVE METABOLIC PANEL - Abnormal; Notable for the following  components:      Result Value   Glucose, Bld 152 (*)    ALT 57 (*)    All other components within normal limits  CBC WITH DIFFERENTIAL/PLATELET - Abnormal; Notable for the following components:   WBC 11.6 (*)    Hemoglobin 15.6 (*)    Monocytes Absolute 1.1 (*)    All other components within normal limits  RESP PANEL BY RT-PCR (FLU A&B, COVID) ARPGX2  LIPASE, BLOOD  URINALYSIS, ROUTINE W REFLEX MICROSCOPIC  TROPONIN I (HIGH SENSITIVITY)    EKG EKG Interpretation  Date/Time:  Saturday Apr 29 2021 16:42:22 EDT Ventricular Rate:  113 PR Interval:  144 QRS Duration: 83 QT Interval:  330 QTC Calculation: 453 R Axis:   59 Text Interpretation: Sinus tachycardia Borderline T abnormalities, anterior leads 12 Lead; [place] Since last tracing rate faster Confirmed by Dorie Rank [phone]) on 04/29/2021 5:52:54 PM   Radiology DG Chest 2 View  Result Date: 04/29/2021 CLINICAL DATA:  Cough and flu like symptoms for several days EXAM: CHEST - 2 VIEW COMPARISON:  11/13/2020 FINDINGS: The heart size and mediastinal contours are within normal limits. Both lungs are clear. The visualized skeletal structures are unremarkable. Gastric lap band is noted in the upper abdomen. IMPRESSION: No active cardiopulmonary disease. Electronically Signed   By: Inez Catalina M.D.   On: 04/29/2021 15:16    Procedures Procedures   Medications Ordered in ED Medications  baclofen (LIORESAL) tablet 10 mg (10 mg Oral Given 04/29/21 1848)  gabapentin (NEURONTIN) capsule 600 mg (600 mg Oral Given 04/29/21 1849)  metoCLOPramide (REGLAN) injection 5 mg (5 mg Intravenous Given 04/29/21 1812)  hydrochlorothiazide (MICROZIDE) capsule 25 mg (25 mg Oral Given 04/29/21 1849)  losartan (COZAAR) tablet 50 mg (50 mg Oral Given 04/29/21 1849)  morphine 4 MG/ML injection 4 mg (4 mg Intravenous Given 04/29/21 1811)  sodium chloride 0.9 % bolus 1,000 mL (0 mLs Intravenous Stopped 04/29/21 2050)  acetaminophen (TYLENOL) tablet 650 mg  (650 mg Oral Given 04/29/21 1849)  diphenhydrAMINE (BENADRYL) injection 12.5 mg (12.5 mg Intravenous Given 04/29/21 1823)  benzonatate (TESSALON) capsule 100 mg (100 mg Oral Given 04/29/21 2036)    ED Course  I have reviewed the triage vital signs and the nursing notes.  Pertinent labs & imaging results that were available during my care of the patient were reviewed by me and considered in my medical decision making (see chart for details).    MDM Rules/Calculators/A&P                          45 year old female with a history of MS presenting for evaluation of URI symptoms nausea and vomiting for the last several days.  While waiting in the waiting room she developed some exacerbation of her MS symptoms and she states that this is typical for her when she is unable to take her MS medicines.  Her laboratory work is reassuring, her COVID and flu are negative.  I gave her her home doses of medications here in the ED as well as Tylenol's, some pain modification and cough medication on reassessment she states she feels completely better.  She is now ambulatory in the ED and states that her sensation is improving.  She she looks very well on exam.  Due to her history, will cover her for potential bacterial respiratory infection with doxycycline and I will also give her Rx for Reglan for her vomiting.  Her abdomen is soft and nontender therefore I have low suspicion for any emergent intra-abdominal/pelvic complaint at this time however she will return if she develops any significant abdominal pain.  I advise she follow-up with her PCP within 1 week and return to the ED for new or worsening symptoms.  She voices understanding of the plan and reasons to return.  All questions answered.  Patient stable for discharge.  Final Clinical Impression(s) / ED Diagnoses Final diagnoses:  Cough  Non-intractable vomiting with nausea, unspecified vomiting type    Rx / DC Orders ED Discharge Orders         Ordered     doxycycline (VIBRAMYCIN) 100 MG capsule  2 times daily        04/29/21 2128    metoCLOPramide (REGLAN) 10 MG tablet  Every 6 hours        04/29/21 2128    benzonatate (TESSALON) 100 MG capsule  Every 8 hours        04/29/21 2128           Rodney Booze, PA-C 04/29/21 2128    Dorie Rank, MD 04/29/21 2153

## 2021-04-29 NOTE — ED Triage Notes (Signed)
Patient reports she develop flu like symptoms last Monday and it has gotten progressively worse. Patient says she has cough , body aches, fevers.

## 2021-04-29 NOTE — ED Notes (Signed)
Patient ambulatory to restroom without difficulty. 

## 2021-04-29 NOTE — ED Notes (Signed)
Patient unable to ambulate at this time due to back pain and lower extremity weakness. PA aware. Additionally, will administer PO medications after nausea is better controlled. PA aware.

## 2021-04-29 NOTE — ED Notes (Signed)
Patient called for room placement x1 with no answer. 

## 2021-04-29 NOTE — ED Provider Notes (Signed)
Emergency Medicine Provider Triage Evaluation Note  [name] , a 45 y.o. female  was evaluated in triage.  Pt complains of cough, chest pain, sob, body aches, fevers that started earlier this week. Further reports nv. Denies abd pain or diarrhea. Has ms and has been unable to take medicine.   Review of Systems  Positive: cough, chest pain, sob, body aches, fevers, nv Negative: Diarrhea, abd pain  Physical Exam  BP 130/87 (BP Location: Left Arm)   Pulse (!) 110   Temp 98.6 F (37 C) (Oral)   Resp 18   Ht 5\' 3"  (1.6 m)   Wt 98.4 kg   LMP 03/17/2012   SpO2 96%   BMI 38.44 kg/m  Gen:   Awake, no distress   Resp:  Normal effort  MSK:   Moves extremities without difficulty  Other:  Lungs ctab, tachycardic, abd soft and nontender  Medical Decision Making  Medically screening exam initiated at 2:02 PM.  Appropriate orders placed.  [name] was informed that the remainder of the evaluation will be completed by another provider, this initial triage assessment does not replace that evaluation, and the importance of remaining in the ED until their evaluation is complete.    Gabriela Booze, PA-C 04/29/21 1405    Gabriela Gambler, MD 04/29/21 1501

## 2021-05-02 DIAGNOSIS — E1142 Type 2 diabetes mellitus with diabetic polyneuropathy: Secondary | ICD-10-CM | POA: Diagnosis not present

## 2021-05-02 DIAGNOSIS — Z0001 Encounter for general adult medical examination with abnormal findings: Secondary | ICD-10-CM | POA: Diagnosis not present

## 2021-05-02 DIAGNOSIS — E782 Mixed hyperlipidemia: Secondary | ICD-10-CM | POA: Diagnosis not present

## 2021-05-02 DIAGNOSIS — E119 Type 2 diabetes mellitus without complications: Secondary | ICD-10-CM | POA: Diagnosis not present

## 2021-05-02 DIAGNOSIS — R7401 Elevation of levels of liver transaminase levels: Secondary | ICD-10-CM | POA: Diagnosis not present

## 2021-05-02 DIAGNOSIS — I10 Essential (primary) hypertension: Secondary | ICD-10-CM | POA: Diagnosis not present

## 2021-05-02 DIAGNOSIS — E663 Overweight: Secondary | ICD-10-CM | POA: Diagnosis not present

## 2021-06-16 ENCOUNTER — Encounter (HOSPITAL_COMMUNITY): Payer: Medicare HMO

## 2021-06-28 ENCOUNTER — Encounter (HOSPITAL_COMMUNITY): Payer: Medicare HMO

## 2021-07-05 DIAGNOSIS — D72819 Decreased white blood cell count, unspecified: Secondary | ICD-10-CM | POA: Diagnosis not present

## 2021-07-05 DIAGNOSIS — G35 Multiple sclerosis: Secondary | ICD-10-CM | POA: Diagnosis not present

## 2021-07-10 ENCOUNTER — Other Ambulatory Visit: Payer: Self-pay

## 2021-07-10 ENCOUNTER — Non-Acute Institutional Stay (HOSPITAL_COMMUNITY)
Admission: RE | Admit: 2021-07-10 | Discharge: 2021-07-10 | Disposition: A | Discharge status: Home / Self Care | Payer: Medicare HMO | Source: Ambulatory Visit | Attending: Internal Medicine | Admitting: Internal Medicine

## 2021-07-10 DIAGNOSIS — G35 Multiple sclerosis: Secondary | ICD-10-CM | POA: Diagnosis not present

## 2021-07-10 MED ORDER — SODIUM CHLORIDE 0.9 % IV SOLN
INTRAVENOUS | Status: DC | PRN
  Administered 2021-07-10: 250 mL via INTRAVENOUS

## 2021-07-10 MED ORDER — METHYLPREDNISOLONE SODIUM SUCC 125 MG IJ SOLR
100.0000 mg | Freq: Once | INTRAMUSCULAR | Status: AC
  Administered 2021-07-10: 100 mg via INTRAVENOUS
  Filled 2021-07-10: qty 2

## 2021-07-10 MED ORDER — DIPHENHYDRAMINE HCL 50 MG/ML IJ SOLN
25.0000 mg | Freq: Once | INTRAMUSCULAR | Status: AC
  Administered 2021-07-10: 25 mg via INTRAVENOUS
  Filled 2021-07-10: qty 1

## 2021-07-10 MED ORDER — ACETAMINOPHEN 325 MG PO TABS
975.0000 mg | ORAL_TABLET | Freq: Once | ORAL | Status: AC
  Administered 2021-07-10: 975 mg via ORAL
  Filled 2021-07-10: qty 3

## 2021-07-10 MED ORDER — SODIUM CHLORIDE 0.9 % IV SOLN
8.0000 mg | Freq: Once | INTRAVENOUS | Status: AC
  Administered 2021-07-10: 8 mg via INTRAVENOUS
  Filled 2021-07-10: qty 3.5

## 2021-07-10 MED ORDER — SODIUM CHLORIDE 0.9 % IV SOLN
600.0000 mg | Freq: Once | INTRAVENOUS | Status: AC
  Administered 2021-07-10: 600 mg via INTRAVENOUS
  Filled 2021-07-10: qty 20

## 2021-07-10 NOTE — Progress Notes (Addendum)
Patient received IV Ocrevus '600mg'$  as ordered by Delphia Grates MD. Pre infusion medications - Benadryl IV,  Solu-medrol IV, Zofran IV and Tylenol PO were given. Spoke with Marylin Crosby, clinical staff for Dr. Dellis Filbert, prior to infusion and verbal approval given to administer Tylenol PO instead of IV as indicated on transcribed order.  Medication was titrated per protocol.  Pt tolerated infusion well, 1 hour into infusion , per pt request, pt received 1x PRN dose of IV benadryl as indicated in orders for hx of pruritis during infusion. Pt states she has extreme itching with each infusion if she does not receive the second dose of benadryl. Pt reported no s/s of a reaction during remainder of infusion, and none noted. Patient declined to wait for the 60 minutes post infusion observation. Vitals stable, discharge instructions given, pt to scheduled next infusion at front desk for 6 months prior to leaving, verbalized understanding. Patient alert, oriented and ambulatory at the time of discharge

## 2021-08-08 DIAGNOSIS — E663 Overweight: Secondary | ICD-10-CM | POA: Diagnosis not present

## 2021-08-08 DIAGNOSIS — E1142 Type 2 diabetes mellitus with diabetic polyneuropathy: Secondary | ICD-10-CM | POA: Diagnosis not present

## 2021-08-08 DIAGNOSIS — E782 Mixed hyperlipidemia: Secondary | ICD-10-CM | POA: Diagnosis not present

## 2021-08-08 DIAGNOSIS — I10 Essential (primary) hypertension: Secondary | ICD-10-CM | POA: Diagnosis not present

## 2021-08-08 DIAGNOSIS — R7401 Elevation of levels of liver transaminase levels: Secondary | ICD-10-CM | POA: Diagnosis not present

## 2021-08-08 DIAGNOSIS — E119 Type 2 diabetes mellitus without complications: Secondary | ICD-10-CM | POA: Diagnosis not present

## 2021-08-23 DIAGNOSIS — E119 Type 2 diabetes mellitus without complications: Secondary | ICD-10-CM | POA: Diagnosis not present

## 2021-10-19 ENCOUNTER — Emergency Department (HOSPITAL_COMMUNITY)
Admission: EM | Admit: 2021-10-19 | Discharge: 2021-10-20 | Disposition: A | Discharge status: Home / Self Care | Payer: Medicare HMO | Attending: Emergency Medicine | Admitting: Emergency Medicine

## 2021-10-19 ENCOUNTER — Emergency Department (HOSPITAL_COMMUNITY): Payer: Medicare HMO

## 2021-10-19 ENCOUNTER — Encounter (HOSPITAL_COMMUNITY): Payer: Self-pay

## 2021-10-19 ENCOUNTER — Other Ambulatory Visit: Payer: Self-pay

## 2021-10-19 DIAGNOSIS — Z85828 Personal history of other malignant neoplasm of skin: Secondary | ICD-10-CM | POA: Insufficient documentation

## 2021-10-19 DIAGNOSIS — Z20822 Contact with and (suspected) exposure to covid-19: Secondary | ICD-10-CM | POA: Diagnosis not present

## 2021-10-19 DIAGNOSIS — H9203 Otalgia, bilateral: Secondary | ICD-10-CM | POA: Diagnosis present

## 2021-10-19 DIAGNOSIS — I10 Essential (primary) hypertension: Secondary | ICD-10-CM | POA: Insufficient documentation

## 2021-10-19 DIAGNOSIS — H65113 Acute and subacute allergic otitis media (mucoid) (sanguinous) (serous), bilateral: Secondary | ICD-10-CM | POA: Insufficient documentation

## 2021-10-19 DIAGNOSIS — Z79899 Other long term (current) drug therapy: Secondary | ICD-10-CM | POA: Diagnosis not present

## 2021-10-19 DIAGNOSIS — J45901 Unspecified asthma with (acute) exacerbation: Secondary | ICD-10-CM | POA: Insufficient documentation

## 2021-10-19 DIAGNOSIS — J069 Acute upper respiratory infection, unspecified: Secondary | ICD-10-CM | POA: Insufficient documentation

## 2021-10-19 DIAGNOSIS — H6693 Otitis media, unspecified, bilateral: Secondary | ICD-10-CM | POA: Diagnosis not present

## 2021-10-19 DIAGNOSIS — R42 Dizziness and giddiness: Secondary | ICD-10-CM | POA: Diagnosis not present

## 2021-10-19 DIAGNOSIS — R0602 Shortness of breath: Secondary | ICD-10-CM | POA: Diagnosis not present

## 2021-10-19 LAB — CBC WITH DIFFERENTIAL/PLATELET
Abs Immature Granulocytes: 0.03 10*3/uL (ref 0.00–0.07)
Basophils Absolute: 0 10*3/uL (ref 0.0–0.1)
Basophils Relative: 0 %
Eosinophils Absolute: 0.4 10*3/uL (ref 0.0–0.5)
Eosinophils Relative: 3 %
HCT: 44.4 % (ref 36.0–46.0)
Hemoglobin: 15.2 g/dL — ABNORMAL HIGH (ref 12.0–15.0)
Immature Granulocytes: 0 %
Lymphocytes Relative: 25 %
Lymphs Abs: 2.7 10*3/uL (ref 0.7–4.0)
MCH: 32.2 pg (ref 26.0–34.0)
MCHC: 34.2 g/dL (ref 30.0–36.0)
MCV: 94.1 fL (ref 80.0–100.0)
Monocytes Absolute: 1 10*3/uL (ref 0.1–1.0)
Monocytes Relative: 9 %
Neutro Abs: 6.9 10*3/uL (ref 1.7–7.7)
Neutrophils Relative %: 63 %
Platelets: 398 10*3/uL (ref 150–400)
RBC: 4.72 MIL/uL (ref 3.87–5.11)
RDW: 12.2 % (ref 11.5–15.5)
WBC: 11.1 10*3/uL — ABNORMAL HIGH (ref 4.0–10.5)
nRBC: 0 % (ref 0.0–0.2)

## 2021-10-19 LAB — COMPREHENSIVE METABOLIC PANEL
ALT: 31 U/L (ref 0–44)
AST: 23 U/L (ref 15–41)
Albumin: 4.2 g/dL (ref 3.5–5.0)
Alkaline Phosphatase: 64 U/L (ref 38–126)
Anion gap: 8 (ref 5–15)
BUN: 8 mg/dL (ref 6–20)
CO2: 24 mmol/L (ref 22–32)
Calcium: 8.8 mg/dL — ABNORMAL LOW (ref 8.9–10.3)
Chloride: 103 mmol/L (ref 98–111)
Creatinine, Ser: 0.61 mg/dL (ref 0.44–1.00)
GFR, Estimated: >60 mL/min (ref >60)
Glucose, Bld: 123 mg/dL — ABNORMAL HIGH (ref 70–99)
Potassium: 3.8 mmol/L (ref 3.5–5.1)
Sodium: 135 mmol/L (ref 135–145)
Total Bilirubin: 0.5 mg/dL (ref 0.3–1.2)
Total Protein: 7.6 g/dL (ref 6.5–8.1)

## 2021-10-19 LAB — RESP PANEL BY RT-PCR (FLU A&B, COVID) ARPGX2
Influenza A by PCR: NEGATIVE
Influenza B by PCR: NEGATIVE
SARS Coronavirus 2 by RT PCR: NEGATIVE

## 2021-10-19 MED ORDER — BENZONATATE 100 MG PO CAPS: 100.0000 mg | ORAL_CAPSULE | Freq: Two times a day (BID) | ORAL | 0 refills | Status: DC | PRN

## 2021-10-19 MED ORDER — AMOXICILLIN 500 MG PO CAPS: 500.0000 mg | ORAL_CAPSULE | Freq: Three times a day (TID) | ORAL | 0 refills | Status: DC

## 2021-10-19 NOTE — ED Provider Notes (Signed)
[facility] DEPT Provider Note   CSN: [id number] Arrival date & time: 10/19/21  2032     History Chief Complaint  Patient presents with   Cough   Otalgia   Shortness of Breath    [name] is a 45 y.o. female.  Patient presents to the emergency department with a chief complaint of bilateral ear pain, left greater than right.  She also reports some cough and tightness/shortness of breath.  She states that her symptoms have been ongoing for about 3 days.  She also reports sinus congestion and pressure.  Denies any successful treatments prior to arrival.  Denies pregnancy or breast-feeding.  The history is provided by the patient. No language interpreter was used.      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   Uterine cancer [facility])     Patient Active Problem List   Diagnosis Date Noted   Neuropathic pain of both legs 09/16/2013   Exacerbation of multiple sclerosis ([place]) 08/01/2013   Multiple sclerosis exacerbation ([place]) 06/30/2013   MIGRAINE HEADACHE 12/15/2010   SINUSITIS 01/19/2010   BRONCHITIS 01/12/2010   Multiple sclerosis ([place]) 06/16/2009   ASTHMA, WITH ACUTE EXACERBATION 03/23/2009   ALLERGIC RHINITIS 02/07/2009   ASTHMATIC BRONCHITIS, ACUTE 12/17/2008   OBESITY, MORBID 02/23/2008   FATTY LIVER DISEASE 01/27/2008   HYPERLIPIDEMIA 01/19/2008   Essential hypertension 06/27/2007   ASTHMA 06/27/2007   GERD 06/27/2007    Past Surgical History:  Procedure Laterality Date   ABDOMINAL HYSTERECTOMY     BARIATRIC SURGERY     lapband   CESAREAN SECTION     CHOLECYSTECTOMY     COLON SURGERY      COLONOSCOPY  09/21/2020   DILATION AND CURETTAGE OF UTERUS     IUD REMOVAL  03/19/2012   denies   LAPAROSCOPIC GASTRIC BANDING  01/26/08   SKIN CANCER EXCISION     scalp and radiation     OB History     Gravida  1   Para  1   Term  1   Preterm      AB      Living  1      SAB  0   IAB      Ectopic  0   Multiple  0   Live Births              Family History  Problem Relation Age of Onset   Diabetes Mother    Depression Mother    Hypertension Mother    Hyperlipidemia Mother    Alzheimer's disease Mother        dx in her 6's   Liver disease Father    Depression Daughter        bipolar depression   Coronary artery disease Other    Cancer Other        ovarian   Colon polyps Brother        benign   Uterine cancer Maternal Grandmother    Colon cancer Maternal Uncle    Anesthesia problems Neg Hx  Esophageal cancer Neg Hx    Rectal cancer Neg Hx    Stomach cancer Neg Hx     Social History   Tobacco Use   Smoking status: Never   Smokeless tobacco: Never  Vaping Use   Vaping Use: Never used  Substance Use Topics   Alcohol use: No   Drug use: Not Currently    Comment: THC gummies on occ.    Home Medications Prior to Admission medications   Medication Sig Start Date End Date Taking? Authorizing Provider  acetaminophen (TYLENOL) 325 MG tablet Take 650 mg by mouth every 6 (six) hours as needed for mild pain, fever or headache.    [provider]  amphetamine-dextroamphetamine (ADDERALL) 20 MG tablet Take 20 mg by mouth daily as needed (to help focus/ADHD). 01/19/21   [provider]  atorvastatin (LIPITOR) 20 MG tablet Take 20 mg by mouth every morning.    [provider]  baclofen (LIORESAL) 10 MG tablet Take 1 tablet (10 mg total) by mouth 3 (three) times daily. Muscle spasm Patient taking differently: Take 10 mg by mouth 3 (three) times daily as needed (tremors/spasms). 01/30/18   Terald Sleeper, PA-C  clobetasol  ointment (TEMOVATE) 3.08 % Apply 1 application topically 2 (two) times daily. 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 Patient taking differently: Apply 4 g topically 2 (two) times daily. 01/30/18   Terald Sleeper, PA-C  dicyclomine (BENTYL) 20 MG tablet Take one tablet every 4-6 hours as needed Patient not taking: No sig reported 09/20/20   Gatha Mayer, MD  diphenhydrAMINE (BENADRYL) 25 MG tablet Take 1 tablet (25 mg total) by mouth every 6 (six) hours. Patient taking differently: Take 25 mg by mouth 2 (two) times daily as needed (with each hydrocodone dose (for itching)). 03/31/17   Rancour, Annie Main, MD  DM-Doxylamine-Acetaminophen (NYQUIL COLD & FLU PO) Take 1 Dose by mouth at bedtime as needed (cough).    [provider]  Erenumab-aooe (AIMOVIG, 140 MG DOSE,) 70 MG/ML SOAJ Inject 140 mg into the skin every 30 (thirty) days. On the 18th of each month    [provider]  famotidine (PEPCID) 20 MG tablet Take 1 tablet (20 mg total) by mouth 2 (two) times daily. Patient not taking: No sig reported 03/31/17   Rancour, Annie Main, MD  gabapentin (NEURONTIN) 600 MG tablet TAKE 2 TABLETS 4 TIMES A DAY Patient taking differently: Take 1,200 mg by mouth 4 (four) times daily. 03/04/18   Terald Sleeper, PA-C  hydrochlorothiazide (HYDRODIURIL) 25 MG tablet Take 25 mg by mouth every morning.    [provider]  HYDROcodone-acetaminophen (NORCO) 10-325 MG tablet Take 1 tablet by mouth every 6 (six) hours as needed for moderate pain.    [provider]  losartan (COZAAR) 50 MG tablet Take 50 mg by mouth every morning.    [provider]  metFORMIN (GLUCOPHAGE-XR) 500 MG 24 hr tablet Take 250 mg by mouth every morning. 02/03/21   [provider]  metoCLOPramide (REGLAN) 10 MG tablet Take 1 tablet (10 mg total) by mouth every 6 (six) hours. 04/29/21   Couture, Cortni S, PA-C   ocrelizumab (OCREVUS) 300 MG/10ML injection Inject 300 mg into the vein every 6 (six) months.    [provider]  omega-3 acid ethyl esters (LOVAZA) 1 g capsule Take 1 g by mouth 2 (two) times daily.  [provider]  omeprazole (PRILOSEC) 20 MG capsule Take 20 mg by mouth every morning. 12/16/20   [provider]  ondansetron (ZOFRAN-ODT) 4 MG disintegrating tablet Take 1 tablet (4 mg total) by mouth every 8 (eight) hours as needed for nausea or vomiting. 01/30/18   Terald Sleeper, PA-C  potassium chloride 20 MEQ TBCR Take 20 mEq by mouth daily. 09/16/20   Alfredia Client, PA-C  Probiotic Product (PROBIOTIC PO) Take 1 capsule by mouth every morning.    [provider]  traZODone (DESYREL) 50 MG tablet Take 50 mg by mouth at bedtime.    [provider]  triamcinolone cream (KENALOG) 0.5 % Apply 1 application topically daily as needed. Patient not taking: No sig reported 01/30/18   Terald Sleeper, PA-C    Allergies    Toradol [ketorolac tromethamine], Metformin and related, and Dilaudid [hydromorphone hcl]  Review of Systems   Review of Systems  All other systems reviewed and are negative.  Physical Exam Updated Vital Signs BP (!) 153/109 (BP Location: Right Arm)   Pulse 91   Temp 99.3 F (37.4 C) (Oral)   Resp 18   Ht 5\' 3"  (1.6 m)   Wt 98.4 kg   LMP 03/17/2012   SpO2 99%   BMI 38.44 kg/m   Physical Exam Vitals and nursing note reviewed.  Constitutional:      General: She is not in acute distress.    Appearance: She is well-developed.  HENT:     Head: Normocephalic and atraumatic.     Ears:     Comments: Serous effusion with mild erythema to left tympanic membrane, mucoid effusion to right TM    Mouth/Throat:     Pharynx: Posterior oropharyngeal erythema present. No oropharyngeal exudate.  Eyes:     Conjunctiva/sclera: Conjunctivae normal.  Cardiovascular:     Rate and Rhythm: Normal rate and regular rhythm.     Heart sounds:  No murmur heard. Pulmonary:     Effort: Pulmonary effort is normal. No respiratory distress.     Breath sounds: Normal breath sounds.  Abdominal:     Palpations: Abdomen is soft.     Tenderness: There is no abdominal tenderness.  Musculoskeletal:        General: No swelling.     Cervical back: Neck supple.  Skin:    General: Skin is warm and dry.     Capillary Refill: Capillary refill takes less than 2 seconds.  Neurological:     Mental Status: She is alert.  Psychiatric:        Mood and Affect: Mood normal.    ED Results / Procedures / Treatments   Labs (all labs ordered are listed, but only abnormal results are displayed) Labs Reviewed  CBC WITH DIFFERENTIAL/PLATELET - Abnormal; Notable for the following components:      Result Value   WBC 11.1 (*)    Hemoglobin 15.2 (*)    All other components within normal limits  COMPREHENSIVE METABOLIC PANEL - Abnormal; Notable for the following components:   Glucose, Bld 123 (*)    Calcium 8.8 (*)    All other components within normal limits  RESP PANEL BY RT-PCR (FLU A&B, COVID) [id number]    EKG None  Radiology DG Chest 2 View  Result Date: 10/19/2021 CLINICAL DATA:  Dizziness. EXAM: CHEST - 2 VIEW COMPARISON:  Apr 29, 2021 FINDINGS: The heart size and mediastinal contours are within normal limits. Both lungs are clear. Radiopaque surgical clips are seen  within the right upper quadrant. The visualized skeletal structures are unremarkable. IMPRESSION: No active cardiopulmonary disease. Electronically Signed   By: Virgina Norfolk M.D.   On: 10/19/2021 21:59    Procedures Procedures   Medications Ordered in ED Medications - No data to display  ED Course  I have reviewed the triage vital signs and the nursing notes.  Pertinent labs & imaging results that were available during my care of the patient were reviewed by me and considered in my medical decision making (see chart for details).    MDM Rules/Calculators/A&P                            Patient here with URI symptoms and also some concerning physical exam findings for otitis media.  Onset was about 3 days ago.  Will cover with amoxicillin.  Recommend PCP follow-up. Final Clinical Impression(s) / ED Diagnoses Final diagnoses:  Acute mucoid otitis media of both ears  Upper respiratory tract infection, unspecified type    Rx / DC Orders ED Discharge Orders          Ordered    amoxicillin (AMOXIL) 500 MG capsule  3 times daily        10/19/21 2349    benzonatate (TESSALON) 100 MG capsule  2 times daily PRN        10/19/21 2349             Montine Circle, PA-C 10/19/21 2350    Orpah Greek, MD 10/20/21 [phone]

## 2021-10-19 NOTE — ED Triage Notes (Signed)
Pt reports with cough, SHOB, and bilateral ear pain x 3 days.

## 2021-10-23 DIAGNOSIS — R7401 Elevation of levels of liver transaminase levels: Secondary | ICD-10-CM | POA: Diagnosis not present

## 2021-10-23 DIAGNOSIS — Z87898 Personal history of other specified conditions: Secondary | ICD-10-CM | POA: Diagnosis not present

## 2021-10-23 DIAGNOSIS — E538 Deficiency of other specified B group vitamins: Secondary | ICD-10-CM | POA: Diagnosis not present

## 2021-10-23 DIAGNOSIS — R69 Illness, unspecified: Secondary | ICD-10-CM | POA: Diagnosis not present

## 2021-10-23 DIAGNOSIS — I10 Essential (primary) hypertension: Secondary | ICD-10-CM | POA: Diagnosis not present

## 2021-10-23 DIAGNOSIS — E782 Mixed hyperlipidemia: Secondary | ICD-10-CM | POA: Diagnosis not present

## 2021-10-23 DIAGNOSIS — D849 Immunodeficiency, unspecified: Secondary | ICD-10-CM | POA: Diagnosis not present

## 2021-10-23 DIAGNOSIS — G35 Multiple sclerosis: Secondary | ICD-10-CM | POA: Diagnosis not present

## 2021-10-23 DIAGNOSIS — E663 Overweight: Secondary | ICD-10-CM | POA: Diagnosis not present

## 2021-10-23 DIAGNOSIS — E1142 Type 2 diabetes mellitus with diabetic polyneuropathy: Secondary | ICD-10-CM | POA: Diagnosis not present

## 2021-10-23 DIAGNOSIS — M792 Neuralgia and neuritis, unspecified: Secondary | ICD-10-CM | POA: Diagnosis not present

## 2021-10-23 DIAGNOSIS — H6501 Acute serous otitis media, right ear: Secondary | ICD-10-CM | POA: Diagnosis not present

## 2021-10-23 DIAGNOSIS — E119 Type 2 diabetes mellitus without complications: Secondary | ICD-10-CM | POA: Diagnosis not present

## 2021-10-30 ENCOUNTER — Other Ambulatory Visit (HOSPITAL_COMMUNITY): Payer: Self-pay

## 2021-11-01 DIAGNOSIS — Z6835 Body mass index (BMI) 35.0-35.9, adult: Secondary | ICD-10-CM | POA: Diagnosis not present

## 2021-11-01 DIAGNOSIS — Z01419 Encounter for gynecological examination (general) (routine) without abnormal findings: Secondary | ICD-10-CM | POA: Diagnosis not present

## 2021-11-01 DIAGNOSIS — L900 Lichen sclerosus et atrophicus: Secondary | ICD-10-CM | POA: Diagnosis not present

## 2021-11-01 DIAGNOSIS — Z1231 Encounter for screening mammogram for malignant neoplasm of breast: Secondary | ICD-10-CM | POA: Diagnosis not present

## 2021-11-01 DIAGNOSIS — E8941 Symptomatic postprocedural ovarian failure: Secondary | ICD-10-CM | POA: Diagnosis not present

## 2021-11-06 DIAGNOSIS — E1142 Type 2 diabetes mellitus with diabetic polyneuropathy: Secondary | ICD-10-CM | POA: Diagnosis not present

## 2021-11-06 DIAGNOSIS — R7401 Elevation of levels of liver transaminase levels: Secondary | ICD-10-CM | POA: Diagnosis not present

## 2021-11-06 DIAGNOSIS — E119 Type 2 diabetes mellitus without complications: Secondary | ICD-10-CM | POA: Diagnosis not present

## 2021-11-06 DIAGNOSIS — E663 Overweight: Secondary | ICD-10-CM | POA: Diagnosis not present

## 2021-11-06 DIAGNOSIS — E782 Mixed hyperlipidemia: Secondary | ICD-10-CM | POA: Diagnosis not present

## 2021-11-06 DIAGNOSIS — I10 Essential (primary) hypertension: Secondary | ICD-10-CM | POA: Diagnosis not present

## 2021-11-13 ENCOUNTER — Telehealth (HOSPITAL_COMMUNITY): Payer: Self-pay

## 2021-11-13 NOTE — Telephone Encounter (Signed)
Left VM for patient informing her that per Beth at [facility], pt cannot receive her infusion as scheduled on 12/13 as it is too early and will not be covered by insurance. Pt should have received a call from Neurologist's office clarifying this with her. Left patient care center nurse's station phone number in VM for pt to call back and confirm cancellation of appointment.

## 2021-11-14 ENCOUNTER — Inpatient Hospital Stay (HOSPITAL_COMMUNITY): Admission: RE | Admit: 2021-11-14 | Payer: Medicare HMO | Source: Ambulatory Visit

## 2021-12-11 ENCOUNTER — Other Ambulatory Visit: Payer: Self-pay

## 2021-12-11 ENCOUNTER — Non-Acute Institutional Stay (HOSPITAL_COMMUNITY)
Admission: RE | Admit: 2021-12-11 | Discharge: 2021-12-11 | Disposition: A | Discharge status: Home / Self Care | Payer: Medicare HMO | Source: Ambulatory Visit | Attending: Internal Medicine | Admitting: Internal Medicine

## 2021-12-11 DIAGNOSIS — G35 Multiple sclerosis: Secondary | ICD-10-CM | POA: Diagnosis not present

## 2021-12-11 MED ORDER — METHYLPREDNISOLONE SODIUM SUCC 125 MG IJ SOLR
100.0000 mg | Freq: Once | INTRAMUSCULAR | Status: AC
  Administered 2021-12-11: 100 mg via INTRAVENOUS
  Filled 2021-12-11: qty 2

## 2021-12-11 MED ORDER — SODIUM CHLORIDE 0.9 % IV SOLN
8.0000 mg | Freq: Once | INTRAVENOUS | Status: AC
  Administered 2021-12-11: 8 mg via INTRAVENOUS
  Filled 2021-12-11: qty 8

## 2021-12-11 MED ORDER — DIPHENHYDRAMINE HCL 50 MG/ML IJ SOLN
25.0000 mg | Freq: Once | INTRAMUSCULAR | Status: AC
  Administered 2021-12-11: 25 mg via INTRAVENOUS
  Filled 2021-12-11: qty 1

## 2021-12-11 MED ORDER — SODIUM CHLORIDE 0.9 % IV SOLN
600.0000 mg | Freq: Once | INTRAVENOUS | Status: AC
  Administered 2021-12-11: 600 mg via INTRAVENOUS
  Filled 2021-12-11: qty 20

## 2021-12-11 MED ORDER — ACETAMINOPHEN 10 MG/ML IV SOLN: 975.0000 mg | Freq: Four times a day (QID) | INTRAVENOUS | Status: DC

## 2021-12-11 MED ORDER — ACETAMINOPHEN 10 MG/ML IV SOLN
1000.0000 mg | Freq: Once | INTRAVENOUS | Status: AC
  Administered 2021-12-11: 1000 mg via INTRAVENOUS
  Filled 2021-12-11: qty 100

## 2021-12-11 MED ORDER — SODIUM CHLORIDE 0.9 % IV SOLN: INTRAVENOUS | Status: DC | PRN

## 2021-12-11 NOTE — Progress Notes (Signed)
Patient received IV Ocrevus 600mg  as ordered by Delphia Grates MD. Pre infusion medications - Benadryl IV, Solu-medrol IV, Tylenol IV, and Zofran IV were given. Prior to infusion,spoke with Mickel Baas, clinical staff for Dr. Dellis Filbert, informed her that the patient has a BP 174/105, pt states she does not have s/s of hypertension but attributes the elevated BP to the pain she is having today, states she did take her BP meds today. Verbal order given to administer Ocrevus as ordered and monitor VS throughout infusion. Medication was titrated per protocol. 1 hour into infusion, pt states her ears and eyebrows felt itchy, PRN dose of benadryl given per orders. Pt reports the itching sensation resolved and voiced no other complaints during remainder of infusion. Tolerated  infusion well, vitals stable,post infusion BP 135/94,  discharge instructions given, verbalized understanding. Pt declined to stay for 1 hour observation after infusion completed. Pt to RTC in 5 months, appointment scheduled for 05/11/22, pt aware ,verbalized understanding. Patient alert, oriented and ambulatory  at the time of discharge, however brought in wheelchair  by staff to meet SO at vehicle.

## 2021-12-19 ENCOUNTER — Emergency Department (HOSPITAL_COMMUNITY)
Admission: EM | Admit: 2021-12-19 | Discharge: 2021-12-19 | Disposition: A | Discharge status: Home / Self Care | Payer: Medicare HMO | Attending: Emergency Medicine | Admitting: Emergency Medicine

## 2021-12-19 ENCOUNTER — Other Ambulatory Visit: Payer: Self-pay

## 2021-12-19 ENCOUNTER — Encounter (HOSPITAL_COMMUNITY): Payer: Self-pay

## 2021-12-19 ENCOUNTER — Emergency Department (HOSPITAL_COMMUNITY): Payer: Medicare HMO

## 2021-12-19 DIAGNOSIS — K047 Periapical abscess without sinus: Secondary | ICD-10-CM | POA: Diagnosis not present

## 2021-12-19 DIAGNOSIS — K0889 Other specified disorders of teeth and supporting structures: Secondary | ICD-10-CM | POA: Diagnosis present

## 2021-12-19 DIAGNOSIS — R220 Localized swelling, mass and lump, head: Secondary | ICD-10-CM

## 2021-12-19 DIAGNOSIS — R590 Localized enlarged lymph nodes: Secondary | ICD-10-CM | POA: Insufficient documentation

## 2021-12-19 DIAGNOSIS — Z85828 Personal history of other malignant neoplasm of skin: Secondary | ICD-10-CM | POA: Diagnosis not present

## 2021-12-19 DIAGNOSIS — I10 Essential (primary) hypertension: Secondary | ICD-10-CM | POA: Diagnosis not present

## 2021-12-19 DIAGNOSIS — K029 Dental caries, unspecified: Secondary | ICD-10-CM | POA: Diagnosis not present

## 2021-12-19 DIAGNOSIS — D72829 Elevated white blood cell count, unspecified: Secondary | ICD-10-CM | POA: Insufficient documentation

## 2021-12-19 DIAGNOSIS — E041 Nontoxic single thyroid nodule: Secondary | ICD-10-CM | POA: Diagnosis not present

## 2021-12-19 DIAGNOSIS — J45901 Unspecified asthma with (acute) exacerbation: Secondary | ICD-10-CM | POA: Insufficient documentation

## 2021-12-19 DIAGNOSIS — Z79899 Other long term (current) drug therapy: Secondary | ICD-10-CM | POA: Insufficient documentation

## 2021-12-19 DIAGNOSIS — M47812 Spondylosis without myelopathy or radiculopathy, cervical region: Secondary | ICD-10-CM | POA: Diagnosis not present

## 2021-12-19 DIAGNOSIS — Z7984 Long term (current) use of oral hypoglycemic drugs: Secondary | ICD-10-CM | POA: Diagnosis not present

## 2021-12-19 DIAGNOSIS — Z8542 Personal history of malignant neoplasm of other parts of uterus: Secondary | ICD-10-CM | POA: Insufficient documentation

## 2021-12-19 DIAGNOSIS — K1120 Sialoadenitis, unspecified: Secondary | ICD-10-CM | POA: Diagnosis not present

## 2021-12-19 LAB — CBC WITH DIFFERENTIAL/PLATELET
Abs Immature Granulocytes: 0.02 10*3/uL (ref 0.00–0.07)
Basophils Absolute: 0 10*3/uL (ref 0.0–0.1)
Basophils Relative: 0 %
Eosinophils Absolute: 0.2 10*3/uL (ref 0.0–0.5)
Eosinophils Relative: 2 %
HCT: 38.1 % (ref 36.0–46.0)
Hemoglobin: 13.2 g/dL (ref 12.0–15.0)
Immature Granulocytes: 0 %
Lymphocytes Relative: 27 %
Lymphs Abs: 2.9 10*3/uL (ref 0.7–4.0)
MCH: 32.6 pg (ref 26.0–34.0)
MCHC: 34.6 g/dL (ref 30.0–36.0)
MCV: 94.1 fL (ref 80.0–100.0)
Monocytes Absolute: 1.1 10*3/uL — ABNORMAL HIGH (ref 0.1–1.0)
Monocytes Relative: 11 %
Neutro Abs: 6.5 10*3/uL (ref 1.7–7.7)
Neutrophils Relative %: 60 %
Platelets: 330 10*3/uL (ref 150–400)
RBC: 4.05 MIL/uL (ref 3.87–5.11)
RDW: 11.8 % (ref 11.5–15.5)
WBC: 10.8 10*3/uL — ABNORMAL HIGH (ref 4.0–10.5)
nRBC: 0 % (ref 0.0–0.2)

## 2021-12-19 LAB — I-STAT CHEM 8, ED
BUN: 5 mg/dL — ABNORMAL LOW (ref 6–20)
Calcium, Ion: 1.2 mmol/L (ref 1.15–1.40)
Chloride: 98 mmol/L (ref 98–111)
Creatinine, Ser: 0.6 mg/dL (ref 0.44–1.00)
Glucose, Bld: 122 mg/dL — ABNORMAL HIGH (ref 70–99)
HCT: 39 % (ref 36.0–46.0)
Hemoglobin: 13.3 g/dL (ref 12.0–15.0)
Potassium: 3.5 mmol/L (ref 3.5–5.1)
Sodium: 138 mmol/L (ref 135–145)
TCO2: 30 mmol/L (ref 22–32)

## 2021-12-19 LAB — BASIC METABOLIC PANEL
Anion gap: 8 (ref 5–15)
BUN: 7 mg/dL (ref 6–20)
CO2: 29 mmol/L (ref 22–32)
Calcium: 9.1 mg/dL (ref 8.9–10.3)
Chloride: 100 mmol/L (ref 98–111)
Creatinine, Ser: 0.63 mg/dL (ref 0.44–1.00)
GFR, Estimated: >60 mL/min (ref >60)
Glucose, Bld: 128 mg/dL — ABNORMAL HIGH (ref 70–99)
Potassium: 3.5 mmol/L (ref 3.5–5.1)
Sodium: 137 mmol/L (ref 135–145)

## 2021-12-19 LAB — I-STAT BETA HCG BLOOD, ED (MC, WL, AP ONLY): I-stat hCG, quantitative: <5 m[IU]/mL (ref <5)

## 2021-12-19 MED ORDER — FENTANYL CITRATE PF 50 MCG/ML IJ SOSY
75.0000 ug | PREFILLED_SYRINGE | Freq: Once | INTRAMUSCULAR | Status: AC
  Administered 2021-12-19: 75 ug via INTRAVENOUS
  Filled 2021-12-19: qty 2

## 2021-12-19 MED ORDER — IOHEXOL 300 MG/ML  SOLN
75.0000 mL | Freq: Once | INTRAMUSCULAR | Status: AC | PRN
  Administered 2021-12-19: 75 mL via INTRAVENOUS

## 2021-12-19 MED ORDER — SODIUM CHLORIDE (PF) 0.9 % IJ SOLN
INTRAMUSCULAR | Status: AC
  Filled 2021-12-19: qty 50

## 2021-12-19 NOTE — ED Provider Notes (Signed)
[facility] DEPT Provider Note  CSN: [id number] Arrival date & time: 12/19/21 0122  Chief Complaint(s) Dental Pain  HPI [name] is a 46 y.o. female here with right facial swelling.  Patient was recently diagnosed with dental infection and started on amoxicillin by her dentist.  Noted swelling today.  Started having pain in the ear.  No fevers or chills.  No nausea or vomiting.  No shortness of breath or difficulty swallowing.  Patient is scheduled to see orthodontist next week.  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   Uterine cancer [facility])    Patient Active Problem List   Diagnosis Date Noted   Neuropathic pain of both legs 09/16/2013   Exacerbation of multiple sclerosis ([place]) 08/01/2013   Multiple sclerosis exacerbation ([place]) 06/30/2013   MIGRAINE HEADACHE 12/15/2010   SINUSITIS 01/19/2010   BRONCHITIS 01/12/2010   Multiple sclerosis ([place]) 06/16/2009   ASTHMA, WITH ACUTE EXACERBATION 03/23/2009   ALLERGIC RHINITIS 02/07/2009   ASTHMATIC BRONCHITIS, ACUTE 12/17/2008   OBESITY, MORBID 02/23/2008   FATTY LIVER DISEASE 01/27/2008   HYPERLIPIDEMIA 01/19/2008   Essential hypertension 06/27/2007   ASTHMA 06/27/2007   GERD 06/27/2007   Home Medication(s) Prior to Admission medications   Medication Sig Start Date End Date Taking? Authorizing Provider  acetaminophen (TYLENOL) 325 MG tablet Take 650 mg by mouth every 6 (six) hours as needed for mild pain, fever or headache.    [provider]  amoxicillin (AMOXIL) 500 MG capsule Take 1 capsule (500 mg total) by  mouth 3 (three) times daily. 10/19/21   Montine Circle, PA-C  amphetamine-dextroamphetamine (ADDERALL) 20 MG tablet Take 20 mg by mouth daily as needed (to help focus/ADHD). 01/19/21   [provider]  atorvastatin (LIPITOR) 20 MG tablet Take 20 mg by mouth every morning.    [provider]  baclofen (LIORESAL) 10 MG tablet Take 1 tablet (10 mg total) by mouth 3 (three) times daily. Muscle spasm Patient taking differently: Take 10 mg by mouth 3 (three) times daily as needed (tremors/spasms). 01/30/18   Terald Sleeper, PA-C  benzonatate (TESSALON) 100 MG capsule Take 1 capsule (100 mg total) by mouth 2 (two) times daily as needed for cough. 10/19/21   Montine Circle, PA-C  clobetasol ointment (TEMOVATE) 4.09 % Apply 1 application topically 2 (two) times daily. For lichen planus 07/03/18   [provider]  diclofenac sodium (VOLTAREN) 1 % GEL Apply 4 g topically 4 (four) times daily. Patient taking differently: Apply 4 g topically 2 (two) times daily. 01/30/18   Terald Sleeper, PA-C  dicyclomine (BENTYL) 20 MG tablet Take one tablet every 4-6 hours as needed Patient not taking: No sig reported 09/20/20   Gatha Mayer, MD  diphenhydrAMINE (BENADRYL) 25 MG tablet Take 1 tablet (25 mg total) by mouth every 6 (six) hours. Patient taking differently: Take 25 mg by mouth 2 (two) times daily as needed (with each hydrocodone dose (for itching)). 03/31/17   Ezequiel Essex, MD  DM-Doxylamine-Acetaminophen (NYQUIL COLD & FLU  PO) Take 1 Dose by mouth at bedtime as needed (cough).    [provider]  Erenumab-aooe (AIMOVIG, 140 MG DOSE,) 70 MG/ML SOAJ Inject 140 mg into the skin every 30 (thirty) days. On the 18th of each month    [provider]  famotidine (PEPCID) 20 MG tablet Take 1 tablet (20 mg total) by mouth 2 (two) times daily. Patient not taking: No sig reported 03/31/17   Rancour, Annie Main, MD  gabapentin (NEURONTIN) 600 MG tablet TAKE 2 TABLETS 4 TIMES A  DAY Patient taking differently: Take 1,200 mg by mouth 4 (four) times daily. 03/04/18   Terald Sleeper, PA-C  hydrochlorothiazide (HYDRODIURIL) 25 MG tablet Take 25 mg by mouth every morning.    [provider]  HYDROcodone-acetaminophen (NORCO) 10-325 MG tablet Take 1 tablet by mouth every 6 (six) hours as needed for moderate pain.    [provider]  losartan (COZAAR) 50 MG tablet Take 50 mg by mouth every morning.    [provider]  metFORMIN (GLUCOPHAGE-XR) 500 MG 24 hr tablet Take 250 mg by mouth every morning. 02/03/21   [provider]  metoCLOPramide (REGLAN) 10 MG tablet Take 1 tablet (10 mg total) by mouth every 6 (six) hours. 04/29/21   Couture, Cortni S, PA-C  ocrelizumab (OCREVUS) 300 MG/10ML injection Inject 300 mg into the vein every 6 (six) months.    [provider]  omega-3 acid ethyl esters (LOVAZA) 1 g capsule Take 1 g by mouth 2 (two) times daily.    [provider]  omeprazole (PRILOSEC) 20 MG capsule Take 20 mg by mouth every morning. 12/16/20   [provider]  ondansetron (ZOFRAN-ODT) 4 MG disintegrating tablet Take 1 tablet (4 mg total) by mouth every 8 (eight) hours as needed for nausea or vomiting. 01/30/18   Terald Sleeper, PA-C  potassium chloride 20 MEQ TBCR Take 20 mEq by mouth daily. 09/16/20   Alfredia Client, PA-C  Probiotic Product (PROBIOTIC PO) Take 1 capsule by mouth every morning.    [provider]  traZODone (DESYREL) 50 MG tablet Take 50 mg by mouth at bedtime.    [provider]  triamcinolone cream (KENALOG) 0.5 % Apply 1 application topically daily as needed. Patient not taking: No sig reported 01/30/18   Terald Sleeper, PA-C                                                                                                                                    Allergies Toradol [ketorolac tromethamine], Metformin and related, and Dilaudid [hydromorphone hcl]  Review of Systems Review  of Systems As noted in HPI  Physical Exam Vital Signs  I have reviewed the triage vital signs BP (!) 159/91    Pulse 79    Temp 98 F (36.7 C) (Oral)    Resp 17    Ht 5\' 3"  (1.6 m)    Wt  98.4 kg    LMP 03/17/2012    SpO2 97%    BMI 38.44 kg/m   Physical Exam HENT:     Head:     Jaw: Swelling present.  Lymphadenopathy:     Head:     Right side of head: Submental and submandibular adenopathy present.     Cervical: Cervical adenopathy present.    ED Results and Treatments Labs (all labs ordered are listed, but only abnormal results are displayed) Labs Reviewed  CBC WITH DIFFERENTIAL/PLATELET - Abnormal; Notable for the following components:      Result Value   WBC 10.8 (*)    Monocytes Absolute 1.1 (*)    All other components within normal limits  BASIC METABOLIC PANEL - Abnormal; Notable for the following components:   Glucose, Bld 128 (*)    All other components within normal limits  I-STAT CHEM 8, ED - Abnormal; Notable for the following components:   BUN 5 (*)    Glucose, Bld 122 (*)    All other components within normal limits  I-STAT BETA HCG BLOOD, ED ([facility], [facility], [facility] ONLY)                                                                                                                         EKG  EKG Interpretation  Date/Time:    Ventricular Rate:    PR Interval:    QRS Duration:   QT Interval:    QTC Calculation:   R Axis:     Text Interpretation:         Radiology CT Soft Tissue Neck W Contrast  Result Date: 12/19/2021 CLINICAL DATA:  Initial evaluation for acute right-sided facial swelling for 3 days. EXAM: CT NECK WITH CONTRAST TECHNIQUE: Multidetector CT imaging of the neck was performed using the standard protocol following the bolus administration of intravenous contrast. RADIATION DOSE REDUCTION: This exam was performed according to the departmental dose-optimization program which includes automated exposure control, adjustment of the mA and/or kV  according to patient size and/or use of iterative reconstruction technique. CONTRAST:  2mL OMNIPAQUE IOHEXOL 300 MG/ML  SOLN COMPARISON:  None available. FINDINGS: Pharynx and larynx: Oral cavity within normal limits. There is asymmetric soft tissue swelling with inflammatory stranding involving the right face, primarily involving the right masticator and submandibular spaces, concerning for acute infection/cellulitis. Few scattered right-sided dental caries noted, most notable about the right first and second mandibular molars, suggesting an odontogenic origin. No visible discrete abscess or loculated collection. No significant extension into the floor of mouth at this time. Otherwise, oropharynx and nasopharynx within normal limits. No retropharyngeal collection or swelling. Normal epiglottis. Hypopharynx and supraglottic larynx within normal limits. Glottis normal. Subglottic airway patent clear. Salivary glands: Parotid and left submandibular glands are within normal limits. Inflammatory changes surround the right submandibular gland related to the acute inflammatory process within the adjacent right face. The gland itself is somewhat enlarged and irregular, favored to be secondary in nature, although  a degree of concomitant sialoadenitis may be present. No obstructive stone or abnormal ductal dilatation. Thyroid: 7 mm left thyroid nodule, of doubtful significance given size and patient age, no follow-up imaging recommended (ref: J Am Coll Radiol. 2015 Feb;12(2): 143-50).Thyroid otherwise unremarkable. Lymph nodes: Mildly asymmetric prominence of a 6 mm right level IB node, likely reactive. No other enlarged or pathologic adenopathy within the neck. Vascular: Normal intravascular enhancement seen throughout the neck. Mild atheromatous change about the carotid bifurcations and carotid siphons noted. Limited intracranial: Unremarkable. Visualized orbits: Unremarkable. Mastoids and visualized paranasal sinuses:  Visualized paranasal sinuses are clear. Visualized mastoid air cells and middle ear cavities are well pneumatized and free of fluid. Skeleton: No discrete or worrisome osseous lesions. Mild for age cervical spondylosis. Upper chest: Visualized upper chest demonstrates no acute finding. Other: None. IMPRESSION: 1. Soft tissue swelling with inflammatory stranding involving the right face, concerning for acute infection/cellulitis. Few scattered dental caries involving the right first and second mandibular molar suggests an odontogenic origin. No discrete abscess or drainable fluid collection. 2. Inflammatory changes surround the adjacent right submandibular gland, favored to be secondary in nature, although a degree of concomitant sialoadenitis may be present. No obstructive stone or abnormal ductal dilatation. Electronically Signed   By: Jeannine Boga M.D.   On: 12/19/2021 04:11    Pertinent labs & imaging results that were available during my care of the patient were reviewed by me and considered in my medical decision making (see MDM for details).  Medications Ordered in ED Medications  sodium chloride (PF) 0.9 % injection (has no administration in time range)  fentaNYL (SUBLIMAZE) injection 75 mcg (75 mcg Intravenous Given 12/19/21 0224)  fentaNYL (SUBLIMAZE) injection 75 mcg (75 mcg Intravenous Given 12/19/21 0327)  iohexol (OMNIPAQUE) 300 MG/ML solution 75 mL (75 mLs Intravenous Contrast Given 12/19/21 0339)                                                                                                                                     Procedures Procedures  (including critical care time)  Medical Decision Making / ED Course        Right facial swelling Known right lower tooth dental infection.  Started on antibiotics yesterday. Will rule deeper tissue infection  Work-up ordered to assess concerns above.  Labs and imaging independently interpreted by me and noted below: CBC with  mild leukocytosis.  No significant anemia. No electrolyte derangements or renal sufficiency. CT with subcutaneous reactive inflammatory changes from dental infection.  Known notable abscess or deep tissue infection requiring surgical intervention at this time.  This was confirmed by radiology  Management: Patient given IV fluids and IV pain medicine  Reassessment: Pain improved  Patient does not require admission to the hospital for IV antibiotics or surgical intervention.  Appropriate for continued outpatient management  Final Clinical Impression(s) / ED Diagnoses Final diagnoses:  Dental infection  Right facial swelling  The patient appears reasonably screened and/or stabilized for discharge and I doubt any other medical condition or other [facility] requiring further screening, evaluation, or treatment in the ED at this time prior to discharge. Safe for discharge with strict return precautions.  Disposition: Discharge  Condition: Good  I have discussed the results, Dx and Tx plan with the patient/family who expressed understanding and agree(s) with the plan. Discharge instructions discussed at length. The patient/family was given strict return precautions who verbalized understanding of the instructions. No further questions at time of discharge.    ED Discharge Orders     None        Follow Up: Orthodontist  Go on 12/25/2021 as scheduled           This chart was dictated using voice recognition software.  Despite best efforts to proofread,  errors can occur which can change the documentation meaning.    Fatima Blank, MD 12/19/21 [phone]

## 2021-12-19 NOTE — Discharge Instructions (Signed)
Continue taking your antibiotics as prescribed

## 2021-12-19 NOTE — ED Triage Notes (Signed)
Pt reports with swollen right side of her face x 3 days. Pt states that she has an infected tooth and was given abts.

## 2021-12-20 DIAGNOSIS — I10 Essential (primary) hypertension: Secondary | ICD-10-CM | POA: Diagnosis not present

## 2022-02-08 DIAGNOSIS — G35 Multiple sclerosis: Secondary | ICD-10-CM | POA: Diagnosis not present

## 2022-02-13 DIAGNOSIS — E1142 Type 2 diabetes mellitus with diabetic polyneuropathy: Secondary | ICD-10-CM | POA: Diagnosis not present

## 2022-02-13 DIAGNOSIS — E119 Type 2 diabetes mellitus without complications: Secondary | ICD-10-CM | POA: Diagnosis not present

## 2022-02-13 DIAGNOSIS — E663 Overweight: Secondary | ICD-10-CM | POA: Diagnosis not present

## 2022-02-13 DIAGNOSIS — I10 Essential (primary) hypertension: Secondary | ICD-10-CM | POA: Diagnosis not present

## 2022-02-13 DIAGNOSIS — E782 Mixed hyperlipidemia: Secondary | ICD-10-CM | POA: Diagnosis not present

## 2022-02-23 DIAGNOSIS — H04123 Dry eye syndrome of bilateral lacrimal glands: Secondary | ICD-10-CM | POA: Diagnosis not present

## 2022-05-07 DIAGNOSIS — M792 Neuralgia and neuritis, unspecified: Secondary | ICD-10-CM | POA: Diagnosis not present

## 2022-05-07 DIAGNOSIS — D849 Immunodeficiency, unspecified: Secondary | ICD-10-CM | POA: Diagnosis not present

## 2022-05-07 DIAGNOSIS — Z7189 Other specified counseling: Secondary | ICD-10-CM | POA: Diagnosis not present

## 2022-05-07 DIAGNOSIS — G35 Multiple sclerosis: Secondary | ICD-10-CM | POA: Diagnosis not present

## 2022-05-07 DIAGNOSIS — Z7689 Persons encountering health services in other specified circumstances: Secondary | ICD-10-CM | POA: Diagnosis not present

## 2022-05-11 ENCOUNTER — Encounter (HOSPITAL_COMMUNITY): Payer: Medicare HMO

## 2022-06-01 ENCOUNTER — Non-Acute Institutional Stay (HOSPITAL_COMMUNITY)
Admission: RE | Admit: 2022-06-01 | Discharge: 2022-06-01 | Disposition: A | Discharge status: Home / Self Care | Payer: Medicare HMO | Source: Ambulatory Visit | Attending: Internal Medicine | Admitting: Internal Medicine

## 2022-06-01 ENCOUNTER — Emergency Department (HOSPITAL_COMMUNITY)
Admission: EM | Admit: 2022-06-01 | Discharge: 2022-06-01 | Disposition: A | Discharge status: Home / Self Care | Payer: Medicare HMO | Attending: Emergency Medicine | Admitting: Emergency Medicine

## 2022-06-01 ENCOUNTER — Emergency Department (HOSPITAL_COMMUNITY): Payer: Medicare HMO

## 2022-06-01 ENCOUNTER — Encounter (HOSPITAL_COMMUNITY): Payer: Self-pay

## 2022-06-01 ENCOUNTER — Other Ambulatory Visit: Payer: Self-pay

## 2022-06-01 DIAGNOSIS — R4182 Altered mental status, unspecified: Secondary | ICD-10-CM | POA: Diagnosis not present

## 2022-06-01 DIAGNOSIS — I959 Hypotension, unspecified: Secondary | ICD-10-CM

## 2022-06-01 DIAGNOSIS — R410 Disorientation, unspecified: Secondary | ICD-10-CM | POA: Diagnosis not present

## 2022-06-01 DIAGNOSIS — R531 Weakness: Secondary | ICD-10-CM | POA: Diagnosis not present

## 2022-06-01 DIAGNOSIS — G35 Multiple sclerosis: Secondary | ICD-10-CM | POA: Insufficient documentation

## 2022-06-01 DIAGNOSIS — R0602 Shortness of breath: Secondary | ICD-10-CM | POA: Diagnosis not present

## 2022-06-01 DIAGNOSIS — Z743 Need for continuous supervision: Secondary | ICD-10-CM | POA: Diagnosis not present

## 2022-06-01 DIAGNOSIS — R42 Dizziness and giddiness: Secondary | ICD-10-CM | POA: Insufficient documentation

## 2022-06-01 LAB — URINALYSIS, ROUTINE W REFLEX MICROSCOPIC
Bacteria, UA: NONE SEEN
Bilirubin Urine: NEGATIVE
Glucose, UA: NEGATIVE mg/dL
Ketones, ur: NEGATIVE mg/dL
Leukocytes,Ua: NEGATIVE
Nitrite: NEGATIVE
Protein, ur: NEGATIVE mg/dL
Specific Gravity, Urine: 1.005 (ref 1.005–1.030)
pH: 6 (ref 5.0–8.0)

## 2022-06-01 LAB — CBC WITH DIFFERENTIAL/PLATELET
Abs Immature Granulocytes: 0.03 10*3/uL (ref 0.00–0.07)
Basophils Absolute: 0 10*3/uL (ref 0.0–0.1)
Basophils Relative: 0 %
Eosinophils Absolute: 0 10*3/uL (ref 0.0–0.5)
Eosinophils Relative: 0 %
HCT: 42 % (ref 36.0–46.0)
Hemoglobin: 14.2 g/dL (ref 12.0–15.0)
Immature Granulocytes: 0 %
Lymphocytes Relative: 11 %
Lymphs Abs: 0.8 10*3/uL (ref 0.7–4.0)
MCH: 32.7 pg (ref 26.0–34.0)
MCHC: 33.8 g/dL (ref 30.0–36.0)
MCV: 96.8 fL (ref 80.0–100.0)
Monocytes Absolute: 0.1 10*3/uL (ref 0.1–1.0)
Monocytes Relative: 1 %
Neutro Abs: 6.6 10*3/uL (ref 1.7–7.7)
Neutrophils Relative %: 88 %
Platelets: 320 10*3/uL (ref 150–400)
RBC: 4.34 MIL/uL (ref 3.87–5.11)
RDW: 12 % (ref 11.5–15.5)
WBC: 7.5 10*3/uL (ref 4.0–10.5)
nRBC: 0 % (ref 0.0–0.2)

## 2022-06-01 LAB — D-DIMER, QUANTITATIVE: D-Dimer, Quant: 0.46 ug/mL-FEU (ref 0.00–0.50)

## 2022-06-01 LAB — RAPID URINE DRUG SCREEN, HOSP PERFORMED
Amphetamines: POSITIVE — AB
Barbiturates: NOT DETECTED
Benzodiazepines: NOT DETECTED
Cocaine: NOT DETECTED
Opiates: POSITIVE — AB
Tetrahydrocannabinol: POSITIVE — AB

## 2022-06-01 LAB — BASIC METABOLIC PANEL
Anion gap: 6 (ref 5–15)
BUN: 11 mg/dL (ref 6–20)
CO2: 24 mmol/L (ref 22–32)
Calcium: 8.6 mg/dL — ABNORMAL LOW (ref 8.9–10.3)
Chloride: 108 mmol/L (ref 98–111)
Creatinine, Ser: 0.94 mg/dL (ref 0.44–1.00)
GFR, Estimated: >60 mL/min (ref >60)
Glucose, Bld: 139 mg/dL — ABNORMAL HIGH (ref 70–99)
Potassium: 4 mmol/L (ref 3.5–5.1)
Sodium: 138 mmol/L (ref 135–145)

## 2022-06-01 LAB — TSH: TSH: 0.429 u[IU]/mL (ref 0.350–4.500)

## 2022-06-01 LAB — MAGNESIUM: Magnesium: 2 mg/dL (ref 1.7–2.4)

## 2022-06-01 MED ORDER — DIPHENHYDRAMINE HCL 50 MG/ML IJ SOLN
25.0000 mg | Freq: Once | INTRAMUSCULAR | Status: AC | PRN
  Administered 2022-06-01: 25 mg via INTRAVENOUS
  Filled 2022-06-01: qty 1

## 2022-06-01 MED ORDER — ACETAMINOPHEN 10 MG/ML IV SOLN
1000.0000 mg | Freq: Once | INTRAVENOUS | Status: AC
  Administered 2022-06-01: 1000 mg via INTRAVENOUS
  Filled 2022-06-01: qty 100

## 2022-06-01 MED ORDER — SODIUM CHLORIDE 0.9 % IV SOLN
600.0000 mg | Freq: Once | INTRAVENOUS | Status: AC
  Administered 2022-06-01: 600 mg via INTRAVENOUS
  Filled 2022-06-01: qty 20

## 2022-06-01 MED ORDER — SODIUM CHLORIDE 0.9 % IV SOLN: INTRAVENOUS | Status: DC | PRN

## 2022-06-01 MED ORDER — METHYLPREDNISOLONE SODIUM SUCC 125 MG IJ SOLR
100.0000 mg | Freq: Once | INTRAMUSCULAR | Status: AC
  Administered 2022-06-01: 100 mg via INTRAVENOUS
  Filled 2022-06-01: qty 2

## 2022-06-01 MED ORDER — DIPHENHYDRAMINE HCL 50 MG/ML IJ SOLN
25.0000 mg | Freq: Once | INTRAMUSCULAR | Status: AC
  Administered 2022-06-01: 25 mg via INTRAVENOUS
  Filled 2022-06-01: qty 1

## 2022-06-01 MED ORDER — SODIUM CHLORIDE 0.9 % IV SOLN
8.0000 mg | Freq: Once | INTRAVENOUS | Status: AC
  Administered 2022-06-01: 8 mg via INTRAVENOUS
  Filled 2022-06-01: qty 4

## 2022-06-01 NOTE — ED Triage Notes (Signed)
Bib ems from MD office; hx MS, gets ocrevus infusion q6 months; halfway through, pt hypotensive, more drowsy, pt endorses confusion since yesterday evening around 6 pm; 50 total benadryl given, pt took hydrocodone this am; pt endorse dizziness, lethargy, generalized weakness; last BP 85/56, p 66, 94% RA, cbg 145; 500 mls ns given pta; denies pain; answering questions appropriately for ems

## 2022-06-01 NOTE — ED Notes (Signed)
Cbg 143

## 2022-06-01 NOTE — ED Notes (Signed)
Orland Jarred partner [phone] requesting an update on the patient

## 2022-06-01 NOTE — Progress Notes (Signed)
Patient receiving Ocrevus 600 mg infusion ordered by Dr. Cleotis Nipper, MD.  Patient pre-medicated with IV solumedrol 100 mg, IV Benadryl 25 mg, IV Acetaminophen 1000 mg and IV Zofran 8 mg per order. Around 2 hours into infusion, patient was woken up for vital sign check. BP 118/86. Upon assessment, patient reported that her back felt itchy and her tongue felt "funny".  Infusion paused and patient given additional dose of 25 mg IV Benadryl per order.  Patient then reported that she was having trouble getting her words out. Patient's speech noted to be slurred and delayed.  Patient reports that she is used to taking Benadryl and that Benadryl does not affect her speech. Patient expresses that "something is wrong".  Vital signs rechecked and BP soft at 94/43. On-site NP's notified and came to assess patient. EMS called. Fluids bolus of 0.9% NS started. Last BP 110/81. Patient alert and oriented but drowsy with slurred speech. Report given to EMS and patient taken to [facility]. Patient's husband, Laverna Peace, notified.

## 2022-06-01 NOTE — Progress Notes (Signed)
Patient's Ocrevus ordered by Dr. Cleotis Nipper at [facility]. Neurology office is closed today. Called and left message on Emergency line notifying on-call provider of patient's neurological change and ED admission during Ocrevus infusion.

## 2022-06-01 NOTE — ED Notes (Signed)
Patient verbalizes understanding of discharge instructions. Opportunity for questioning and answers were provided. Pt discharged from ED. 

## 2022-06-01 NOTE — Discharge Instructions (Signed)
Increase your fluid intake at home.  Call your primary care doctor or specialist as discussed in the next 2-3 days.   Return immediately back to the ER if:  Your symptoms worsen within the next 12-24 hours. You develop new symptoms such as new fevers, persistent vomiting, new pain, shortness of breath, or new weakness or numbness, or if you have any other concerns.

## 2022-06-01 NOTE — ED Provider Notes (Signed)
Patient signed out to me is pending repeat evaluation.  She had an episode of low blood pressure which appears improved.  Monitor for additional hours without any other hypotensive episodes.  Work-up otherwise unremarkable.  Patient has no complaints at this time.  Etiology of her symptoms are unclear.  Perhaps she had a near syncopal type episode, perhaps she had a reaction to the transfusion.  We will recommend outpatient follow-up with her primary care doctor or neurologist this week, recommending immediate return for fevers worsening symptoms or any additional concerns.   Luna Fuse, MD 06/01/22 [phone]

## 2022-06-01 NOTE — ED Provider Notes (Signed)
[facility] EMERGENCY DEPARTMENT Provider Note   CSN: [id number] Arrival date & time: 06/01/22  1341     History  Chief Complaint  Patient presents with   Altered Mental Status   Hypotension    [name] is a 46 y.o. female.  Patient is a 46 year old female with past medical history of MS presenting for hypotension.  Patient states she was receiving her transfusion for Ocrevus (ocrelizumab) when she began feeling lethargy and dizziness and was notified for blood pressure of 85/56.  Glucose was stable at 145.  Patient states she has been receiving these infusions since 2017 every 6 months and has never had a reaction in the past.  Denies any wheezing, difficulty breathing, rashes, or nausea.  Patient states over the last several days she has been having " cognitive fog".  States she usually has symptoms as she nears the end of the 59-monthperiod prior to her next infusion dose and then has resolution of symtpoms after transfusion.   Otherwise denies any fevers, chills, coughing, abdominal pain nausea, vomiting, diarrhea.  Denies any vaginal bleeding, rectal bleeding, black or bloody stools.  Surgical history positive for hysterectomy.  The history is provided by the patient. No language interpreter was used.  Altered Mental Status Associated symptoms: light-headedness   Associated symptoms: no abdominal pain, no fever, no palpitations, no rash, no seizures and no vomiting        Home Medications Prior to Admission medications   Medication Sig Start Date End Date Taking? Authorizing Provider  acetaminophen (TYLENOL) 325 MG tablet Take 650 mg by mouth every 6 (six) hours as needed for mild pain, fever or headache.    [provider]  amoxicillin (AMOXIL) 500 MG capsule Take 1 capsule (500 mg total) by mouth 3 (three) times daily. 10/19/21   BMontine Circle PA-C  amphetamine-dextroamphetamine (ADDERALL) 20 MG tablet Take 20 mg by mouth daily as needed  (to help focus/ADHD). 01/19/21   [provider]  atorvastatin (LIPITOR) 20 MG tablet Take 20 mg by mouth every morning.    [provider]  baclofen (LIORESAL) 10 MG tablet Take 1 tablet (10 mg total) by mouth 3 (three) times daily. Muscle spasm Patient taking differently: Take 10 mg by mouth 3 (three) times daily as needed (tremors/spasms). 01/30/18   JTerald Sleeper PA-C  benzonatate (TESSALON) 100 MG capsule Take 1 capsule (100 mg total) by mouth 2 (two) times daily as needed for cough. 10/19/21   BMontine Circle PA-C  clobetasol ointment (TEMOVATE) 00.24% Apply 1 application topically 2 (two) times daily. For lichen planus 50/9/73  [provider]  diclofenac sodium (VOLTAREN) 1 % GEL Apply 4 g topically 4 (four) times daily. Patient taking differently: Apply 4 g topically 2 (two) times daily. 01/30/18   JTerald Sleeper PA-C  dicyclomine (BENTYL) 20 MG tablet Take one tablet every 4-6 hours as needed Patient not taking: No sig reported 09/20/20   GGatha Mayer MD  diphenhydrAMINE (BENADRYL) 25 MG tablet Take 1 tablet (25 mg total) by mouth every 6 (six) hours. Patient taking differently: Take 25 mg by mouth 2 (two) times daily as needed (with each hydrocodone dose (for itching)). 03/31/17   Rancour, SAnnie Main MD  DM-Doxylamine-Acetaminophen (NYQUIL COLD & FLU PO) Take 1 Dose by mouth at bedtime as needed (cough).    [provider]  Erenumab-aooe (AIMOVIG, 140 MG DOSE,) 70 MG/ML SOAJ Inject 140 mg into the skin every 30 (thirty) days. On  the 18th of each month    [provider]  famotidine (PEPCID) 20 MG tablet Take 1 tablet (20 mg total) by mouth 2 (two) times daily. Patient not taking: No sig reported 03/31/17   Rancour, Annie Main, MD  gabapentin (NEURONTIN) 600 MG tablet TAKE 2 TABLETS 4 TIMES A DAY Patient taking differently: Take 1,200 mg by mouth 4 (four) times daily. 03/04/18   Terald Sleeper, PA-C  hydrochlorothiazide (HYDRODIURIL) 25 MG tablet  Take 25 mg by mouth every morning.    [provider]  HYDROcodone-acetaminophen (NORCO) 10-325 MG tablet Take 1 tablet by mouth every 6 (six) hours as needed for moderate pain.    [provider]  losartan (COZAAR) 50 MG tablet Take 50 mg by mouth every morning.    [provider]  metFORMIN (GLUCOPHAGE-XR) 500 MG 24 hr tablet Take 250 mg by mouth every morning. 02/03/21   [provider]  metoCLOPramide (REGLAN) 10 MG tablet Take 1 tablet (10 mg total) by mouth every 6 (six) hours. 04/29/21   Couture, Cortni S, PA-C  ocrelizumab (OCREVUS) 300 MG/10ML injection Inject 300 mg into the vein every 6 (six) months.    [provider]  omega-3 acid ethyl esters (LOVAZA) 1 g capsule Take 1 g by mouth 2 (two) times daily.    [provider]  omeprazole (PRILOSEC) 20 MG capsule Take 20 mg by mouth every morning. 12/16/20   [provider]  ondansetron (ZOFRAN-ODT) 4 MG disintegrating tablet Take 1 tablet (4 mg total) by mouth every 8 (eight) hours as needed for nausea or vomiting. 01/30/18   Terald Sleeper, PA-C  potassium chloride 20 MEQ TBCR Take 20 mEq by mouth daily. 09/16/20   Alfredia Client, PA-C  Probiotic Product (PROBIOTIC PO) Take 1 capsule by mouth every morning.    [provider]  traZODone (DESYREL) 50 MG tablet Take 50 mg by mouth at bedtime.    [provider]  triamcinolone cream (KENALOG) 0.5 % Apply 1 application topically daily as needed. Patient not taking: No sig reported 01/30/18   Terald Sleeper, PA-C      Allergies    Toradol [ketorolac tromethamine], Metformin and related, and Dilaudid [hydromorphone hcl]    Review of Systems   Review of Systems  Constitutional:  Negative for chills and fever.  HENT:  Negative for ear pain and sore throat.   Eyes:  Negative for pain and visual disturbance.  Respiratory:  Negative for cough and shortness of breath.   Cardiovascular:  Negative for chest pain and  palpitations.  Gastrointestinal:  Negative for abdominal pain and vomiting.  Genitourinary:  Negative for dysuria and hematuria.  Musculoskeletal:  Negative for arthralgias and back pain.  Skin:  Negative for color change and rash.  Neurological:  Positive for dizziness and light-headedness. Negative for seizures and syncope.  All other systems reviewed and are negative.   Physical Exam Updated Vital Signs BP 103/73   Pulse 78   Temp 97.6 F (36.4 C) (Oral)   Resp (!) 28   Ht '5\' 3"'$  (1.6 m)   Wt 95.3 kg   LMP 03/17/2012   SpO2 92%   BMI 37.20 kg/m  Physical Exam Vitals and nursing note reviewed.  Constitutional:      General: She is not in acute distress.    Appearance: She is well-developed.  HENT:     Head: Normocephalic and atraumatic.  Eyes:     General: Lids are normal. Vision grossly intact.  Conjunctiva/sclera: Conjunctivae normal.     Pupils: Pupils are equal, round, and reactive to light.  Cardiovascular:     Rate and Rhythm: Normal rate and regular rhythm.     Heart sounds: No murmur heard. Pulmonary:     Effort: Pulmonary effort is normal. No respiratory distress.     Breath sounds: Normal breath sounds.  Abdominal:     Palpations: Abdomen is soft.     Tenderness: There is no abdominal tenderness.  Musculoskeletal:        General: No swelling.     Cervical back: Neck supple.  Skin:    General: Skin is warm and dry.     Capillary Refill: Capillary refill takes less than 2 seconds.  Neurological:     Mental Status: She is alert and oriented to person, place, and time.     GCS: GCS eye subscore is 4. GCS verbal subscore is 5. GCS motor subscore is 6.     Cranial Nerves: Cranial nerves 2-12 are intact.     Sensory: Sensation is intact.     Motor: Motor function is intact.  Psychiatric:        Mood and Affect: Mood normal.     ED Results / Procedures / Treatments   Labs (all labs ordered are listed, but only abnormal results are displayed) Labs  Reviewed  URINALYSIS, ROUTINE W REFLEX MICROSCOPIC - Abnormal; Notable for the following components:      Result Value   Color, Urine STRAW (*)    Hgb urine dipstick SMALL (*)    All other components within normal limits  CBC WITH DIFFERENTIAL/PLATELET  D-DIMER, QUANTITATIVE  BASIC METABOLIC PANEL  MAGNESIUM  RAPID URINE DRUG SCREEN, HOSP PERFORMED  TSH    EKG EKG Interpretation  Date/Time:  Friday June 01 2022 13:56:26 EDT Ventricular Rate:  68 PR Interval:  172 QRS Duration: 99 QT Interval:  452 QTC Calculation: 481 R Axis:   72 Text Interpretation: Sinus rhythm ST elev, probable normal early repol pattern Confirmed by Thamas Jaegers ([id number]) on 06/01/2022 3:14:07 PM  Radiology DG Chest Portable 1 View  Result Date: 06/01/2022 CLINICAL DATA:  Shortness of breath EXAM: PORTABLE CHEST 1 VIEW COMPARISON:  10/19/2021 FINDINGS: The heart size and mediastinal contours are within normal limits. Both lungs are clear. The visualized skeletal structures are unremarkable. IMPRESSION: No active disease. Electronically Signed   By: Kathreen Devoid M.D.   On: 06/01/2022 14:25    Procedures Procedures    Medications Ordered in ED Medications - No data to display  ED Course/ Medical Decision Making/ A&P                           Medical Decision Making Amount and/or Complexity of Data Reviewed Labs: ordered. Radiology: ordered. ECG/medicine tests: ordered.   44:53 PM 46 year old female with past medical history of MS presenting for hypotension.  Patient is alert and oriented x3, no acute distress, afebrile, blood pressure of 90/77 satting at 95% on room air.  EMS report states that while patient was receiving MS med tranfusion Ocrevus (mab) patient began having lightheadedness, dizziness, and was found to be hypotensive 85/56 with a pulse of 94%.    Patient denies any infectious signs or symptoms including no fevers, chills, nausea, vomiting, domino pain, diarrhea, coughing, or dysuria.   No suspicious findings on exam.  I would hypothesize possible anaphylaxis/allergic reaction since attention started during infusion however patient otherwise denies any wheezing,  abdominal pain, difficulty breathing, tongue swelling, rashes, or nausea. Blood pressure currently improving after IV fluids at 103/73.  We will continue to watch closely.  No other intervention or medication started at this time.  Patient out to oncoming physician Dr. Almyra Free awaiting laboratory studies to rule out other etiologies of hypotension.  Plan for discharge if pressure continues to improve with discussion with primary care physician regarding monoclonal antibody.        Final Clinical Impression(s) / ED Diagnoses Final diagnoses:  Hypotension, unspecified hypotension type    Rx / DC Orders ED Discharge Orders     None         Lianne Cure, DO 61/96/94 1517

## 2022-06-04 LAB — CBG MONITORING, ED: Glucose-Capillary: 143 mg/dL — ABNORMAL HIGH (ref 70–99)

## 2022-06-11 ENCOUNTER — Telehealth (HOSPITAL_COMMUNITY): Payer: Self-pay | Admitting: *Deleted

## 2022-06-11 NOTE — Telephone Encounter (Signed)
Spoke with Dr. Cleotis Nipper' nurse, Tanja Port, from [facility]. Patient was only able to receive half her dose of Ocrevus 600 mg infusion on 06/01/22.  Patient scheduled to receive remaining dose of Ocrevus on 06/13/22. Verbal order given for patient to receive 300 mg Ocrevus infusion on 06/13/22.

## 2022-06-13 ENCOUNTER — Non-Acute Institutional Stay (HOSPITAL_COMMUNITY)
Admission: RE | Admit: 2022-06-13 | Discharge: 2022-06-13 | Disposition: A | Discharge status: Home / Self Care | Payer: Medicare HMO | Source: Ambulatory Visit | Attending: Internal Medicine | Admitting: Internal Medicine

## 2022-06-13 DIAGNOSIS — G35 Multiple sclerosis: Secondary | ICD-10-CM | POA: Diagnosis not present

## 2022-06-13 MED ORDER — SODIUM CHLORIDE 0.9 % IV SOLN
8.0000 mg | Freq: Once | INTRAVENOUS | Status: AC
  Administered 2022-06-13: 8 mg via INTRAVENOUS
  Filled 2022-06-13: qty 4

## 2022-06-13 MED ORDER — SODIUM CHLORIDE 0.9 % IV SOLN
300.0000 mg | Freq: Once | INTRAVENOUS | Status: AC
  Administered 2022-06-13: 300 mg via INTRAVENOUS
  Filled 2022-06-13: qty 10

## 2022-06-13 MED ORDER — DIPHENHYDRAMINE HCL 50 MG/ML IJ SOLN
25.0000 mg | Freq: Once | INTRAMUSCULAR | Status: AC
  Administered 2022-06-13: 25 mg via INTRAVENOUS
  Filled 2022-06-13: qty 1

## 2022-06-13 MED ORDER — METHYLPREDNISOLONE SODIUM SUCC 125 MG IJ SOLR
100.0000 mg | Freq: Once | INTRAMUSCULAR | Status: AC
  Administered 2022-06-13: 100 mg via INTRAVENOUS
  Filled 2022-06-13: qty 2

## 2022-06-13 MED ORDER — DIPHENHYDRAMINE HCL 50 MG/ML IJ SOLN
25.0000 mg | Freq: Once | INTRAMUSCULAR | Status: AC | PRN
Start: 2022-06-13 — End: 2022-06-13
  Administered 2022-06-13: 25 mg via INTRAVENOUS
  Filled 2022-06-13: qty 1

## 2022-06-13 MED ORDER — SODIUM CHLORIDE 0.9 % IV SOLN: INTRAVENOUS | Status: DC | PRN

## 2022-06-13 MED ORDER — ACETAMINOPHEN 10 MG/ML IV SOLN
1000.0000 mg | Freq: Once | INTRAVENOUS | Status: AC
  Administered 2022-06-13: 1000 mg via INTRAVENOUS
  Filled 2022-06-13: qty 100

## 2022-06-13 NOTE — Progress Notes (Addendum)
PATIENT CARE CENTER NOTE:  Diagnosis:  Multiple sclerosis  Provider: Cleotis Nipper MD  Procedure: De Nurse '300mg'$  infusion    Patient received IV Ocrevus, dosing adjusted to '300mg'$  today as pt received half of her infusion on 6/30 but did not receive full dose due to  slurred speech and hypotension during infusion, pt was transferred to hospital setting by EMS at that time. Provider's clinical staff Talia at [facility] gave verbal order for dose adjustment today.  Pre infusion medications - Benadryl IV, Solu-medrol IV, Tylenol IV, and Zofran IV were given. Medication was titrated per protocol. Pt required 1x PRN dose of benadryl IV per orders for itching 1 hour into infusion, otherwise tolerated well, vitals stable throughout. Discharge instructions given, verbalized understanding. Patient declined to wait for the 60 minutes post infusion observation. Pt to RTC in 5 months per orders to receive next infusion, pt made aware and verbalized understanding to schedule next appointment. Patient alert, oriented and ambulatory at the time of discharge.

## 2022-07-19 DIAGNOSIS — E876 Hypokalemia: Secondary | ICD-10-CM | POA: Diagnosis not present

## 2022-07-19 DIAGNOSIS — G35 Multiple sclerosis: Secondary | ICD-10-CM | POA: Diagnosis not present

## 2022-07-19 DIAGNOSIS — E1142 Type 2 diabetes mellitus with diabetic polyneuropathy: Secondary | ICD-10-CM | POA: Diagnosis not present

## 2022-07-19 DIAGNOSIS — D84821 Immunodeficiency due to drugs: Secondary | ICD-10-CM | POA: Diagnosis not present

## 2022-07-19 DIAGNOSIS — L299 Pruritus, unspecified: Secondary | ICD-10-CM | POA: Diagnosis not present

## 2022-07-19 DIAGNOSIS — I10 Essential (primary) hypertension: Secondary | ICD-10-CM | POA: Diagnosis not present

## 2022-07-19 DIAGNOSIS — Z008 Encounter for other general examination: Secondary | ICD-10-CM | POA: Diagnosis not present

## 2022-07-19 DIAGNOSIS — R69 Illness, unspecified: Secondary | ICD-10-CM | POA: Diagnosis not present

## 2022-07-19 DIAGNOSIS — E785 Hyperlipidemia, unspecified: Secondary | ICD-10-CM | POA: Diagnosis not present

## 2022-07-19 DIAGNOSIS — K5900 Constipation, unspecified: Secondary | ICD-10-CM | POA: Diagnosis not present

## 2022-07-19 DIAGNOSIS — K219 Gastro-esophageal reflux disease without esophagitis: Secondary | ICD-10-CM | POA: Diagnosis not present

## 2022-08-03 DIAGNOSIS — J302 Other seasonal allergic rhinitis: Secondary | ICD-10-CM | POA: Diagnosis not present

## 2022-08-03 DIAGNOSIS — E119 Type 2 diabetes mellitus without complications: Secondary | ICD-10-CM | POA: Diagnosis not present

## 2022-08-03 DIAGNOSIS — E663 Overweight: Secondary | ICD-10-CM | POA: Diagnosis not present

## 2022-08-03 DIAGNOSIS — E1142 Type 2 diabetes mellitus with diabetic polyneuropathy: Secondary | ICD-10-CM | POA: Diagnosis not present

## 2022-08-03 DIAGNOSIS — H1010 Acute atopic conjunctivitis, unspecified eye: Secondary | ICD-10-CM | POA: Diagnosis not present

## 2022-08-03 DIAGNOSIS — E782 Mixed hyperlipidemia: Secondary | ICD-10-CM | POA: Diagnosis not present

## 2022-08-03 DIAGNOSIS — I10 Essential (primary) hypertension: Secondary | ICD-10-CM | POA: Diagnosis not present

## 2022-09-18 DIAGNOSIS — G35 Multiple sclerosis: Secondary | ICD-10-CM | POA: Diagnosis not present

## 2022-09-18 DIAGNOSIS — Z7189 Other specified counseling: Secondary | ICD-10-CM | POA: Diagnosis not present

## 2022-10-03 DIAGNOSIS — Z23 Encounter for immunization: Secondary | ICD-10-CM | POA: Diagnosis not present

## 2022-10-03 DIAGNOSIS — J302 Other seasonal allergic rhinitis: Secondary | ICD-10-CM | POA: Diagnosis not present

## 2022-10-03 DIAGNOSIS — E1142 Type 2 diabetes mellitus with diabetic polyneuropathy: Secondary | ICD-10-CM | POA: Diagnosis not present

## 2022-10-03 DIAGNOSIS — E663 Overweight: Secondary | ICD-10-CM | POA: Diagnosis not present

## 2022-10-03 DIAGNOSIS — K219 Gastro-esophageal reflux disease without esophagitis: Secondary | ICD-10-CM | POA: Diagnosis not present

## 2022-10-03 DIAGNOSIS — I10 Essential (primary) hypertension: Secondary | ICD-10-CM | POA: Diagnosis not present

## 2022-10-03 DIAGNOSIS — E782 Mixed hyperlipidemia: Secondary | ICD-10-CM | POA: Diagnosis not present

## 2022-10-03 DIAGNOSIS — E119 Type 2 diabetes mellitus without complications: Secondary | ICD-10-CM | POA: Diagnosis not present

## 2022-11-01 DIAGNOSIS — K219 Gastro-esophageal reflux disease without esophagitis: Secondary | ICD-10-CM | POA: Diagnosis not present

## 2022-11-01 DIAGNOSIS — E119 Type 2 diabetes mellitus without complications: Secondary | ICD-10-CM | POA: Diagnosis not present

## 2022-11-01 DIAGNOSIS — E782 Mixed hyperlipidemia: Secondary | ICD-10-CM | POA: Diagnosis not present

## 2022-11-01 DIAGNOSIS — I10 Essential (primary) hypertension: Secondary | ICD-10-CM | POA: Diagnosis not present

## 2022-11-08 DIAGNOSIS — I10 Essential (primary) hypertension: Secondary | ICD-10-CM | POA: Diagnosis not present

## 2022-11-08 DIAGNOSIS — E782 Mixed hyperlipidemia: Secondary | ICD-10-CM | POA: Diagnosis not present

## 2022-11-08 DIAGNOSIS — Z0001 Encounter for general adult medical examination with abnormal findings: Secondary | ICD-10-CM | POA: Diagnosis not present

## 2022-11-08 DIAGNOSIS — E1142 Type 2 diabetes mellitus with diabetic polyneuropathy: Secondary | ICD-10-CM | POA: Diagnosis not present

## 2022-11-14 DIAGNOSIS — G35 Multiple sclerosis: Secondary | ICD-10-CM | POA: Diagnosis not present

## 2022-11-28 DIAGNOSIS — G35 Multiple sclerosis: Secondary | ICD-10-CM | POA: Diagnosis not present

## 2022-12-21 DIAGNOSIS — R319 Hematuria, unspecified: Secondary | ICD-10-CM | POA: Diagnosis not present

## 2022-12-21 DIAGNOSIS — N9089 Other specified noninflammatory disorders of vulva and perineum: Secondary | ICD-10-CM | POA: Diagnosis not present

## 2022-12-21 DIAGNOSIS — N762 Acute vulvitis: Secondary | ICD-10-CM | POA: Diagnosis not present

## 2023-01-25 DIAGNOSIS — N760 Acute vaginitis: Secondary | ICD-10-CM | POA: Diagnosis not present

## 2023-01-25 DIAGNOSIS — N9089 Other specified noninflammatory disorders of vulva and perineum: Secondary | ICD-10-CM | POA: Diagnosis not present

## 2023-03-25 DIAGNOSIS — Z6836 Body mass index (BMI) 36.0-36.9, adult: Secondary | ICD-10-CM | POA: Diagnosis not present

## 2023-03-25 DIAGNOSIS — Z01419 Encounter for gynecological examination (general) (routine) without abnormal findings: Secondary | ICD-10-CM | POA: Diagnosis not present

## 2023-03-25 DIAGNOSIS — Z1231 Encounter for screening mammogram for malignant neoplasm of breast: Secondary | ICD-10-CM | POA: Diagnosis not present

## 2023-04-01 DIAGNOSIS — E119 Type 2 diabetes mellitus without complications: Secondary | ICD-10-CM | POA: Diagnosis not present

## 2023-04-08 DIAGNOSIS — Z8041 Family history of malignant neoplasm of ovary: Secondary | ICD-10-CM | POA: Diagnosis not present

## 2023-05-15 DIAGNOSIS — D72819 Decreased white blood cell count, unspecified: Secondary | ICD-10-CM | POA: Diagnosis not present

## 2023-05-15 DIAGNOSIS — G35 Multiple sclerosis: Secondary | ICD-10-CM | POA: Diagnosis not present

## 2023-06-26 DIAGNOSIS — M792 Neuralgia and neuritis, unspecified: Secondary | ICD-10-CM | POA: Diagnosis not present

## 2023-06-26 DIAGNOSIS — H538 Other visual disturbances: Secondary | ICD-10-CM | POA: Diagnosis not present

## 2023-06-26 DIAGNOSIS — G35 Multiple sclerosis: Secondary | ICD-10-CM | POA: Diagnosis not present

## 2023-06-27 ENCOUNTER — Other Ambulatory Visit (HOSPITAL_COMMUNITY): Payer: Self-pay | Admitting: Neurology

## 2023-06-27 DIAGNOSIS — G35 Multiple sclerosis: Secondary | ICD-10-CM

## 2023-07-05 ENCOUNTER — Ambulatory Visit (HOSPITAL_COMMUNITY): Admission: RE | Admit: 2023-07-05 | Payer: Medicare HMO | Source: Ambulatory Visit

## 2023-07-05 DIAGNOSIS — G35 Multiple sclerosis: Secondary | ICD-10-CM | POA: Insufficient documentation

## 2023-07-05 MED ORDER — GADOBUTROL 1 MMOL/ML IV SOLN
10.0000 mL | Freq: Once | INTRAVENOUS | Status: AC | PRN
  Administered 2023-07-05: 10 mL via INTRAVENOUS

## 2023-07-17 DIAGNOSIS — E782 Mixed hyperlipidemia: Secondary | ICD-10-CM | POA: Diagnosis not present

## 2023-07-17 DIAGNOSIS — K219 Gastro-esophageal reflux disease without esophagitis: Secondary | ICD-10-CM | POA: Diagnosis not present

## 2023-07-17 DIAGNOSIS — Z0001 Encounter for general adult medical examination with abnormal findings: Secondary | ICD-10-CM | POA: Diagnosis not present

## 2023-07-17 DIAGNOSIS — E1142 Type 2 diabetes mellitus with diabetic polyneuropathy: Secondary | ICD-10-CM | POA: Diagnosis not present

## 2023-07-17 DIAGNOSIS — I10 Essential (primary) hypertension: Secondary | ICD-10-CM | POA: Diagnosis not present

## 2023-07-18 DIAGNOSIS — E782 Mixed hyperlipidemia: Secondary | ICD-10-CM | POA: Diagnosis not present

## 2023-07-18 DIAGNOSIS — Z0001 Encounter for general adult medical examination with abnormal findings: Secondary | ICD-10-CM | POA: Diagnosis not present

## 2023-07-18 DIAGNOSIS — K219 Gastro-esophageal reflux disease without esophagitis: Secondary | ICD-10-CM | POA: Diagnosis not present

## 2023-07-18 DIAGNOSIS — I10 Essential (primary) hypertension: Secondary | ICD-10-CM | POA: Diagnosis not present

## 2023-07-18 DIAGNOSIS — E1142 Type 2 diabetes mellitus with diabetic polyneuropathy: Secondary | ICD-10-CM | POA: Diagnosis not present

## 2023-07-29 ENCOUNTER — Other Ambulatory Visit: Payer: Self-pay | Admitting: Medical Genetics

## 2023-07-29 DIAGNOSIS — Z006 Encounter for examination for normal comparison and control in clinical research program: Secondary | ICD-10-CM

## 2023-09-03 DIAGNOSIS — E1142 Type 2 diabetes mellitus with diabetic polyneuropathy: Secondary | ICD-10-CM | POA: Diagnosis not present

## 2023-09-03 DIAGNOSIS — I10 Essential (primary) hypertension: Secondary | ICD-10-CM | POA: Diagnosis not present

## 2023-09-03 DIAGNOSIS — E782 Mixed hyperlipidemia: Secondary | ICD-10-CM | POA: Diagnosis not present

## 2023-09-03 DIAGNOSIS — K219 Gastro-esophageal reflux disease without esophagitis: Secondary | ICD-10-CM | POA: Diagnosis not present

## 2023-09-03 DIAGNOSIS — Z23 Encounter for immunization: Secondary | ICD-10-CM | POA: Diagnosis not present

## 2023-09-13 ENCOUNTER — Encounter (INDEPENDENT_AMBULATORY_CARE_PROVIDER_SITE_OTHER): Payer: Self-pay

## 2023-10-30 DIAGNOSIS — G35 Multiple sclerosis: Secondary | ICD-10-CM | POA: Diagnosis not present

## 2023-11-22 ENCOUNTER — Emergency Department (HOSPITAL_COMMUNITY): Payer: Medicare HMO

## 2023-11-22 ENCOUNTER — Emergency Department (HOSPITAL_COMMUNITY)
Admission: EM | Admit: 2023-11-22 | Discharge: 2023-11-22 | Disposition: A | Discharge status: Home / Self Care | Payer: Medicare HMO | Attending: Emergency Medicine | Admitting: Emergency Medicine

## 2023-11-22 ENCOUNTER — Encounter (HOSPITAL_COMMUNITY): Payer: Self-pay | Admitting: *Deleted

## 2023-11-22 DIAGNOSIS — B9789 Other viral agents as the cause of diseases classified elsewhere: Secondary | ICD-10-CM | POA: Diagnosis not present

## 2023-11-22 DIAGNOSIS — R059 Cough, unspecified: Secondary | ICD-10-CM | POA: Diagnosis not present

## 2023-11-22 DIAGNOSIS — I10 Essential (primary) hypertension: Secondary | ICD-10-CM | POA: Diagnosis not present

## 2023-11-22 DIAGNOSIS — Z1152 Encounter for screening for COVID-19: Secondary | ICD-10-CM | POA: Insufficient documentation

## 2023-11-22 DIAGNOSIS — J45909 Unspecified asthma, uncomplicated: Secondary | ICD-10-CM | POA: Insufficient documentation

## 2023-11-22 DIAGNOSIS — J069 Acute upper respiratory infection, unspecified: Secondary | ICD-10-CM | POA: Diagnosis not present

## 2023-11-22 DIAGNOSIS — R42 Dizziness and giddiness: Secondary | ICD-10-CM | POA: Diagnosis not present

## 2023-11-22 LAB — COMPREHENSIVE METABOLIC PANEL
ALT: 20 U/L (ref 0–44)
AST: 17 U/L (ref 15–41)
Albumin: 3.6 g/dL (ref 3.5–5.0)
Alkaline Phosphatase: 58 U/L (ref 38–126)
Anion gap: 7 (ref 5–15)
BUN: 9 mg/dL (ref 6–20)
CO2: 21 mmol/L — ABNORMAL LOW (ref 22–32)
Calcium: 8.9 mg/dL (ref 8.9–10.3)
Chloride: 109 mmol/L (ref 98–111)
Creatinine, Ser: 0.96 mg/dL (ref 0.44–1.00)
GFR, Estimated: >60 mL/min (ref >60)
Glucose, Bld: 117 mg/dL — ABNORMAL HIGH (ref 70–99)
Potassium: 3.8 mmol/L (ref 3.5–5.1)
Sodium: 137 mmol/L (ref 135–145)
Total Bilirubin: 0.6 mg/dL (ref <1.2)
Total Protein: 6.4 g/dL — ABNORMAL LOW (ref 6.5–8.1)

## 2023-11-22 LAB — CBC WITH DIFFERENTIAL/PLATELET
Abs Immature Granulocytes: 0.03 10*3/uL (ref 0.00–0.07)
Basophils Absolute: 0 10*3/uL (ref 0.0–0.1)
Basophils Relative: 0 %
Eosinophils Absolute: 0.2 10*3/uL (ref 0.0–0.5)
Eosinophils Relative: 3 %
HCT: 40.2 % (ref 36.0–46.0)
Hemoglobin: 14 g/dL (ref 12.0–15.0)
Immature Granulocytes: 0 %
Lymphocytes Relative: 36 %
Lymphs Abs: 2.5 10*3/uL (ref 0.7–4.0)
MCH: 31.8 pg (ref 26.0–34.0)
MCHC: 34.8 g/dL (ref 30.0–36.0)
MCV: 91.4 fL (ref 80.0–100.0)
Monocytes Absolute: 0.9 10*3/uL (ref 0.1–1.0)
Monocytes Relative: 13 %
Neutro Abs: 3.2 10*3/uL (ref 1.7–7.7)
Neutrophils Relative %: 48 %
Platelets: 322 10*3/uL (ref 150–400)
RBC: 4.4 MIL/uL (ref 3.87–5.11)
RDW: 11.9 % (ref 11.5–15.5)
WBC: 6.8 10*3/uL (ref 4.0–10.5)
nRBC: 0 % (ref 0.0–0.2)

## 2023-11-22 LAB — RESP PANEL BY RT-PCR (RSV, FLU A&B, COVID)  RVPGX2
Influenza A by PCR: NEGATIVE
Influenza B by PCR: NEGATIVE
Resp Syncytial Virus by PCR: NEGATIVE
SARS Coronavirus 2 by RT PCR: NEGATIVE

## 2023-11-22 MED ORDER — BENZONATATE 100 MG PO CAPS: 100.0000 mg | ORAL_CAPSULE | Freq: Three times a day (TID) | ORAL | 0 refills | Status: DC | PRN

## 2023-11-22 MED ORDER — ALBUTEROL SULFATE (2.5 MG/3ML) 0.083% IN NEBU: 2.5000 mg | INHALATION_SOLUTION | Freq: Four times a day (QID) | RESPIRATORY_TRACT | 12 refills | Status: AC | PRN

## 2023-11-22 MED ORDER — ALBUTEROL SULFATE HFA 108 (90 BASE) MCG/ACT IN AERS: 1.0000 | INHALATION_SPRAY | Freq: Four times a day (QID) | RESPIRATORY_TRACT | 0 refills | Status: AC | PRN

## 2023-11-22 MED ORDER — PREDNISONE 50 MG PO TABS: 50.0000 mg | ORAL_TABLET | Freq: Every day | ORAL | 0 refills | Status: AC

## 2023-11-22 NOTE — Discharge Instructions (Addendum)
The test today in the ED were reassuring.  No signs of pneumonia.  Your COVID flu influenza test was negative.  Take the medications as needed to help with your cough and congestion.  Follow-up with your primary care doctor to be rechecked if the symptoms have not resolved.

## 2023-11-22 NOTE — ED Triage Notes (Signed)
C/o dry cough that is now productive states she has coughed all night did take some Nyquil before going to bed and was able to sleep. Woke up gasping for air however after getting up was able to breath better. States she can't breath good when laying down.

## 2023-11-22 NOTE — ED Provider Notes (Signed)
[place] EMERGENCY DEPARTMENT AT [facility] Provider Note   CSN: [id number] Arrival date & time: 11/22/23  3244     History  Chief Complaint  Patient presents with   Cough    [name] is a 47 y.o. female.   Cough  Patient has a history of hyperlipidemia multiple sclerosis hypertension sinusitis bronchitis, asthma, migraines who presents ED for evaluation of cough congestion shortness of breath.  Patient states she has had some nasal congestion and cough for the last several days.  Last night she was having trouble breathing.  She was waking up coughing and not feeling like she could catch her breath.  Patient does have a history of asthma.  She does not have any albuterol solution.  Her symptoms do increase when she is lying flat.  She denies any fevers.  No leg swelling.      Home Medications Prior to Admission medications   Medication Sig Start Date End Date Taking? Authorizing Provider  albuterol (PROVENTIL) (2.5 MG/3ML) 0.083% nebulizer solution Take 3 mLs (2.5 mg total) by nebulization every 6 (six) hours as needed for wheezing or shortness of breath. 11/22/23  Yes Linwood Dibbles, MD  albuterol (VENTOLIN HFA) 108 (90 Base) MCG/ACT inhaler Inhale 1-2 puffs into the lungs every 6 (six) hours as needed for wheezing or shortness of breath. 11/22/23  Yes Linwood Dibbles, MD  amLODipine (NORVASC) 2.5 MG tablet Take 2.5 mg by mouth daily. 11/16/23  Yes [provider]  benzonatate (TESSALON) 100 MG capsule Take 1 capsule (100 mg total) by mouth 3 (three) times daily as needed for cough. 11/22/23  Yes Linwood Dibbles, MD  predniSONE (DELTASONE) 50 MG tablet Take 1 tablet (50 mg total) by mouth daily. 11/22/23  Yes Linwood Dibbles, MD  acetaminophen (TYLENOL) 325 MG tablet Take 650 mg by mouth every 6 (six) hours as needed for mild pain, fever or headache.    [provider]  amphetamine-dextroamphetamine (ADDERALL) 20 MG tablet Take 20 mg by mouth daily as needed (to help  focus/ADHD). 01/19/21   [provider]  atorvastatin (LIPITOR) 20 MG tablet Take 20 mg by mouth every morning.    [provider]  baclofen (LIORESAL) 10 MG tablet Take 1 tablet (10 mg total) by mouth 3 (three) times daily. Muscle spasm Patient taking differently: Take 10 mg by mouth 3 (three) times daily as needed (tremors/spasms). 01/30/18   Remus Loffler, PA-C  clobetasol ointment (TEMOVATE) 0.05 % Apply 1 application topically 2 (two) times daily. For lichen planus 04/07/21   [provider]  diclofenac sodium (VOLTAREN) 1 % GEL Apply 4 g topically 4 (four) times daily. Patient taking differently: Apply 4 g topically 2 (two) times daily. 01/30/18   Remus Loffler, PA-C  dicyclomine (BENTYL) 20 MG tablet Take one tablet every 4-6 hours as needed Patient not taking: No sig reported 09/20/20   Iva Boop, MD  Erenumab-aooe (AIMOVIG, 140 MG DOSE,) 70 MG/ML SOAJ Inject 140 mg into the skin every 30 (thirty) days. On the 18th of each month    [provider]  gabapentin (NEURONTIN) 600 MG tablet TAKE 2 TABLETS 4 TIMES A DAY Patient taking differently: Take 1,200 mg by mouth 4 (four) times daily. 03/04/18   Remus Loffler, PA-C  hydrochlorothiazide (HYDRODIURIL) 25 MG tablet Take 25 mg by mouth every morning.    [provider]  HYDROcodone-acetaminophen (NORCO) 10-325 MG tablet Take 1 tablet by mouth every 6 (six) hours as needed  for moderate pain.    [provider]  losartan (COZAAR) 50 MG tablet Take 50 mg by mouth every morning.    [provider]  omega-3 acid ethyl esters (LOVAZA) 1 g capsule Take 1 g by mouth 2 (two) times daily.    [provider]  omeprazole (PRILOSEC) 20 MG capsule Take 20 mg by mouth every morning. 12/16/20   [provider]  ondansetron (ZOFRAN-ODT) 4 MG disintegrating tablet Take 1 tablet (4 mg total) by mouth every 8 (eight) hours as needed for nausea or vomiting. 01/30/18   Remus Loffler,  PA-C  Probiotic Product (PROBIOTIC PO) Take 1 capsule by mouth every morning.    [provider]  traZODone (DESYREL) 50 MG tablet Take 50 mg by mouth at bedtime.    [provider]  triamcinolone cream (KENALOG) 0.5 % Apply 1 application topically daily as needed. Patient not taking: No sig reported 01/30/18   Remus Loffler, PA-C      Allergies    Toradol [ketorolac tromethamine], Metformin and related, Tramadol, and Dilaudid [hydromorphone hcl]    Review of Systems   Review of Systems  Respiratory:  Positive for cough.     Physical Exam Updated Vital Signs BP 124/87 (BP Location: Right Arm)   Pulse 94   Temp 98.1 F (36.7 C) (Temporal)   Resp 17   Ht 1.6 m (5\' 3" )   Wt 83.9 kg   LMP 03/17/2012   SpO2 99%   BMI 32.77 kg/m  Physical Exam Vitals and nursing note reviewed.  Constitutional:      General: She is not in acute distress.    Appearance: She is well-developed.  HENT:     Head: Normocephalic and atraumatic.     Right Ear: External ear normal.     Left Ear: External ear normal.  Eyes:     General: No scleral icterus.       Right eye: No discharge.        Left eye: No discharge.     Conjunctiva/sclera: Conjunctivae normal.  Neck:     Trachea: No tracheal deviation.  Cardiovascular:     Rate and Rhythm: Normal rate and regular rhythm.  Pulmonary:     Effort: Pulmonary effort is normal. No respiratory distress.     Breath sounds: Normal breath sounds. No stridor. No wheezing or rales.  Abdominal:     General: Bowel sounds are normal. There is no distension.     Palpations: Abdomen is soft.     Tenderness: There is no abdominal tenderness. There is no guarding or rebound.  Musculoskeletal:        General: No tenderness or deformity.     Cervical back: Neck supple.  Skin:    General: Skin is warm and dry.     Findings: No rash.  Neurological:     General: No focal deficit present.     Mental Status: She is alert.     Cranial Nerves: No  cranial nerve deficit, dysarthria or facial asymmetry.     Sensory: No sensory deficit.     Motor: No abnormal muscle tone or seizure activity.     Coordination: Coordination normal.  Psychiatric:        Mood and Affect: Mood normal.     ED Results / Procedures / Treatments   Labs (all labs ordered are listed, but only abnormal results are displayed) Labs Reviewed  COMPREHENSIVE METABOLIC PANEL - Abnormal; Notable for the following components:  Result Value   CO2 21 (*)    Glucose, Bld 117 (*)    Total Protein 6.4 (*)    All other components within normal limits  RESP PANEL BY RT-PCR (RSV, FLU A&B, COVID)  [id number]  CBC WITH DIFFERENTIAL/PLATELET    EKG EKG Interpretation Date/Time:  Friday November 22 2023 05:39:32 EST Ventricular Rate:  92 PR Interval:  156 QRS Duration:  88 QT Interval:  382 QTC Calculation: 472 R Axis:   69  Text Interpretation: Normal sinus rhythm Normal ECG When compared with ECG of 01-Jun-2022 13:56, No significant change since last tracing Confirmed by Linwood Dibbles [phone]) on 11/22/2023 8:55:05 AM  Radiology DG Chest 2 View Result Date: 11/22/2023 CLINICAL DATA:  Cough and dizziness EXAM: CHEST - 2 VIEW COMPARISON:  06/01/2022 FINDINGS: Normal heart size and mediastinal contours. No acute infiltrate or edema. No effusion or pneumothorax. No acute osseous findings. IMPRESSION: No active cardiopulmonary disease. Electronically Signed   By: Tiburcio Pea M.D.   On: 11/22/2023 06:56    Procedures Procedures    Medications Ordered in ED Medications - No data to display  ED Course/ Medical Decision Making/ A&P Clinical Course as of 11/22/23 0910  Fri Nov 22, 2023  5956 Chest x-ray without acute findings. [JK]  0854 CBC with Differential CBC normal [JK]  0854 No significant metabolic abnormalities COVID flu RSV negative [JK]    Clinical Course User Index [JK] Linwood Dibbles, MD                                 Medical Decision Making Amount  and/or Complexity of Data Reviewed Labs: ordered. Decision-making details documented in ED Course. Radiology: ordered.  Risk Prescription drug management.   Patient's ED workup is reassuring.  Her x-ray does not show evidence of pneumonia.  No evidence of pneumothorax.  No signs of CHF.  Low risk for PE PERC negative.  Negative for COVID influenza  I do not hear any wheezing on exam at this time but it certainly possible patient may have a bronchospasm component and associated with her viral URI type symptoms.  Discharge home with course of steroids refill of her albuterol and Tessalon to help with her cough  Evaluation and diagnostic testing in the emergency department does not suggest an emergent condition requiring admission or immediate intervention beyond what has been performed at this time.  The patient is safe for discharge and has been instructed to return immediately for worsening symptoms, change in symptoms or any other concerns.        Final Clinical Impression(s) / ED Diagnoses Final diagnoses:  Viral URI with cough    Rx / DC Orders ED Discharge Orders          Ordered    predniSONE (DELTASONE) 50 MG tablet  Daily        11/22/23 0910    albuterol (VENTOLIN HFA) 108 (90 Base) MCG/ACT inhaler  Every 6 hours PRN        11/22/23 0910    albuterol (PROVENTIL) (2.5 MG/3ML) 0.083% nebulizer solution  Every 6 hours PRN        11/22/23 0910    benzonatate (TESSALON) 100 MG capsule  3 times daily PRN        11/22/23 0910              Linwood Dibbles, MD 11/22/23 [phone]

## 2023-11-28 DIAGNOSIS — J40 Bronchitis, not specified as acute or chronic: Secondary | ICD-10-CM | POA: Diagnosis not present

## 2023-11-28 DIAGNOSIS — R051 Acute cough: Secondary | ICD-10-CM | POA: Diagnosis not present

## 2023-12-10 DIAGNOSIS — I10 Essential (primary) hypertension: Secondary | ICD-10-CM | POA: Diagnosis not present

## 2023-12-10 DIAGNOSIS — R030 Elevated blood-pressure reading, without diagnosis of hypertension: Secondary | ICD-10-CM | POA: Diagnosis not present

## 2023-12-10 DIAGNOSIS — E119 Type 2 diabetes mellitus without complications: Secondary | ICD-10-CM | POA: Diagnosis not present

## 2023-12-10 DIAGNOSIS — E1142 Type 2 diabetes mellitus with diabetic polyneuropathy: Secondary | ICD-10-CM | POA: Diagnosis not present

## 2023-12-10 DIAGNOSIS — E782 Mixed hyperlipidemia: Secondary | ICD-10-CM | POA: Diagnosis not present

## 2023-12-10 DIAGNOSIS — G4700 Insomnia, unspecified: Secondary | ICD-10-CM | POA: Diagnosis not present

## 2024-02-05 DIAGNOSIS — E782 Mixed hyperlipidemia: Secondary | ICD-10-CM | POA: Diagnosis not present

## 2024-02-05 DIAGNOSIS — M542 Cervicalgia: Secondary | ICD-10-CM | POA: Diagnosis not present

## 2024-02-05 DIAGNOSIS — I10 Essential (primary) hypertension: Secondary | ICD-10-CM | POA: Diagnosis not present

## 2024-02-05 DIAGNOSIS — E119 Type 2 diabetes mellitus without complications: Secondary | ICD-10-CM | POA: Diagnosis not present

## 2024-02-05 DIAGNOSIS — E1142 Type 2 diabetes mellitus with diabetic polyneuropathy: Secondary | ICD-10-CM | POA: Diagnosis not present

## 2024-02-11 DIAGNOSIS — M542 Cervicalgia: Secondary | ICD-10-CM | POA: Diagnosis not present

## 2024-02-12 DIAGNOSIS — I10 Essential (primary) hypertension: Secondary | ICD-10-CM | POA: Diagnosis not present

## 2024-02-13 ENCOUNTER — Other Ambulatory Visit: Payer: Self-pay

## 2024-02-13 ENCOUNTER — Emergency Department (HOSPITAL_COMMUNITY)
Admission: EM | Admit: 2024-02-13 | Discharge: 2024-02-13 | Disposition: A | Discharge status: Home / Self Care | Attending: Emergency Medicine | Admitting: Emergency Medicine

## 2024-02-13 DIAGNOSIS — I10 Essential (primary) hypertension: Secondary | ICD-10-CM | POA: Insufficient documentation

## 2024-02-13 DIAGNOSIS — Z79899 Other long term (current) drug therapy: Secondary | ICD-10-CM | POA: Insufficient documentation

## 2024-02-13 DIAGNOSIS — T380X5A Adverse effect of glucocorticoids and synthetic analogues, initial encounter: Secondary | ICD-10-CM | POA: Diagnosis not present

## 2024-02-13 LAB — CBC WITH DIFFERENTIAL/PLATELET
Abs Immature Granulocytes: 0.06 10*3/uL (ref 0.00–0.07)
Basophils Absolute: 0 10*3/uL (ref 0.0–0.1)
Basophils Relative: 0 %
Eosinophils Absolute: 0.1 10*3/uL (ref 0.0–0.5)
Eosinophils Relative: 0 %
HCT: 41.9 % (ref 36.0–46.0)
Hemoglobin: 14.7 g/dL (ref 12.0–15.0)
Immature Granulocytes: 0 %
Lymphocytes Relative: 27 %
Lymphs Abs: 4 10*3/uL (ref 0.7–4.0)
MCH: 32.4 pg (ref 26.0–34.0)
MCHC: 35.1 g/dL (ref 30.0–36.0)
MCV: 92.3 fL (ref 80.0–100.0)
Monocytes Absolute: 1.5 10*3/uL — ABNORMAL HIGH (ref 0.1–1.0)
Monocytes Relative: 10 %
Neutro Abs: 9 10*3/uL — ABNORMAL HIGH (ref 1.7–7.7)
Neutrophils Relative %: 63 %
Platelets: 372 10*3/uL (ref 150–400)
RBC: 4.54 MIL/uL (ref 3.87–5.11)
RDW: 12.1 % (ref 11.5–15.5)
WBC: 14.6 10*3/uL — ABNORMAL HIGH (ref 4.0–10.5)
nRBC: 0 % (ref 0.0–0.2)

## 2024-02-13 LAB — BASIC METABOLIC PANEL
Anion gap: 9 (ref 5–15)
BUN: 11 mg/dL (ref 6–20)
CO2: 24 mmol/L (ref 22–32)
Calcium: 8.7 mg/dL — ABNORMAL LOW (ref 8.9–10.3)
Chloride: 104 mmol/L (ref 98–111)
Creatinine, Ser: 0.66 mg/dL (ref 0.44–1.00)
GFR, Estimated: >60 mL/min (ref >60)
Glucose, Bld: 120 mg/dL — ABNORMAL HIGH (ref 70–99)
Potassium: 3.5 mmol/L (ref 3.5–5.1)
Sodium: 137 mmol/L (ref 135–145)

## 2024-02-13 NOTE — ED Provider Notes (Signed)
 [place] EMERGENCY DEPARTMENT AT [facility] Provider Note   CSN: [id number] Arrival date & time: 02/13/24  8469     History  Chief Complaint  Patient presents with   Hypertension   Headache    [name] is a 48 y.o. female.  48 year old female here today for high blood pressure.  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  She had previously been taking amlodipine for hypertension, no longer needed to keep taking it after success with lifestyle interventions.  She noticed that her blood pressure was running high, she went to her PCP who restarted her on 2.5 mg of amlodipine.  Patient comes in today because her blood pressures continue to be high.  She has not been taking her other medications that she typically takes for her MS which include Vicodin, baclofen, or her medications for ADHD.  She says she was concerned about drug interactions.   Hypertension Associated symptoms include headaches.  Headache      Home Medications Prior to Admission medications   Medication Sig Start Date End Date Taking? Authorizing Provider  acetaminophen (TYLENOL) 325 MG tablet Take 650 mg by mouth every 6 (six) hours as needed for mild pain, fever or headache.    [provider]  albuterol (PROVENTIL) (2.5 MG/3ML) 0.083% nebulizer solution Take 3 mLs (2.5 mg total) by nebulization every 6 (six) hours as needed for wheezing or shortness of breath. 11/22/23   Linwood Dibbles, MD  albuterol (VENTOLIN HFA) 108 (90 Base) MCG/ACT inhaler Inhale 1-2 puffs into the lungs every 6 (six) hours as needed for wheezing or shortness of breath. 11/22/23   Linwood Dibbles, MD  amLODipine (NORVASC) 2.5 MG tablet Take 2.5 mg by mouth daily. 11/16/23   [provider]  amphetamine-dextroamphetamine (ADDERALL) 20 MG tablet Take 20 mg by mouth daily as needed (to help focus/ADHD). 01/19/21   [provider]  atorvastatin (LIPITOR) 20 MG tablet Take 20 mg by mouth  every morning.    [provider]  baclofen (LIORESAL) 10 MG tablet Take 1 tablet (10 mg total) by mouth 3 (three) times daily. Muscle spasm Patient taking differently: Take 10 mg by mouth 3 (three) times daily as needed (tremors/spasms). 01/30/18   Remus Loffler, PA-C  benzonatate (TESSALON) 100 MG capsule Take 1 capsule (100 mg total) by mouth 3 (three) times daily as needed for cough. 11/22/23   Linwood Dibbles, MD  clobetasol ointment (TEMOVATE) 0.05 % Apply 1 application topically 2 (two) times daily. For lichen planus 04/07/21   [provider]  diclofenac sodium (VOLTAREN) 1 % GEL Apply 4 g topically 4 (four) times daily. Patient taking differently: Apply 4 g topically 2 (two) times daily. 01/30/18   Remus Loffler, PA-C  Erenumab-aooe (AIMOVIG, 140 MG DOSE,) 70 MG/ML SOAJ Inject 140 mg into the skin every 30 (thirty) days. On the 18th of each month    [provider]  gabapentin (NEURONTIN) 600 MG tablet TAKE 2 TABLETS 4 TIMES A DAY Patient taking differently: Take 1,200 mg by mouth 4 (four) times daily. 03/04/18   Remus Loffler, PA-C  hydrochlorothiazide (HYDRODIURIL) 25 MG tablet Take 25 mg by mouth every morning.    [provider]  HYDROcodone-acetaminophen (NORCO) 10-325 MG tablet Take 1 tablet by mouth every 6 (six) hours as needed for moderate pain.    [provider]  losartan (COZAAR) 50 MG tablet Take 50 mg by mouth every morning.    [provider]  omega-3 acid ethyl esters (LOVAZA) 1 g capsule Take 1 g by mouth 2 (two) times daily.    [provider]  omeprazole (PRILOSEC) 20 MG capsule Take 20 mg by mouth every morning. 12/16/20   [provider]  ondansetron (ZOFRAN-ODT) 4 MG disintegrating tablet Take 1 tablet (4 mg total) by mouth every 8 (eight) hours as needed for nausea or vomiting. 01/30/18   Remus Loffler, PA-C  predniSONE (DELTASONE) 50 MG tablet Take 1 tablet (50 mg total) by mouth daily. 11/22/23   Linwood Dibbles, MD  Probiotic Product (PROBIOTIC PO) Take 1 capsule by mouth every morning.    [provider]  traZODone (DESYREL) 50 MG tablet Take 50 mg by mouth at bedtime.    [provider]      Allergies    Toradol [ketorolac tromethamine], Metformin and related, Tramadol, and Dilaudid [hydromorphone hcl]    Review of Systems   Review of Systems  Neurological:  Positive for headaches.    Physical Exam Updated Vital Signs BP (!) 149/96 (BP Location: Right Arm)   Pulse 83   Temp 98.1 F (36.7 C) (Oral)   Resp 15   Ht 5\' 3"  (1.6 m)   Wt 93.9 kg   LMP 03/17/2012   SpO2 97%   BMI 36.67 kg/m  Physical Exam Vitals reviewed.  Eyes:     General: No visual field deficit. Neurological:     Mental Status: She is alert.     Cranial Nerves: No cranial nerve deficit, dysarthria or facial asymmetry.     Sensory: No sensory deficit.     Motor: No weakness.     ED Results / Procedures / Treatments   Labs (all labs ordered are listed, but only abnormal results are displayed) Labs Reviewed  CBC WITH DIFFERENTIAL/PLATELET - Abnormal; Notable for the following components:      Result Value   WBC 14.6 (*)    Neutro Abs 9.0 (*)    Monocytes Absolute 1.5 (*)    All other components within normal limits  BASIC METABOLIC PANEL - Abnormal; Notable for the following components:   Glucose, Bld 120 (*)    Calcium 8.7 (*)    All other components within normal limits    EKG None  Radiology No results found.  Procedures Procedures    Medications Ordered in ED Medications - No data to display  ED Course/ Medical Decision Making/ A&P                                 Medical Decision Making 48 year old female is here today for high blood pressure.  Plan -I reviewed the patient's medication list and confirmed them with her at bedside.  I reviewed the most recent available notes within our system.  Patient has no neurological deficits.  Blood pressure is 154/110.   She has not had issues until starting prednisone.  Patient is not experiencing a hypertensive emergency.  Labs were ordered at triage.  Reassessment 8:30 AM-patient's CBC with a mild leukocytosis with neutrophil predominance consistent with someone on steroids.  Her renal function is normal.  Patient's repeat blood pressure 149/96.  Have discharged.    Amount and/or Complexity of Data Reviewed Labs: ordered.           Final Clinical Impression(s) / ED Diagnoses Final diagnoses:  Adverse effect of corticosteroids, initial encounter    Rx / DC  Orders ED Discharge Orders     None         Arletha Pili, DO 02/13/24 [phone]

## 2024-02-13 NOTE — Discharge Instructions (Addendum)
 Your blood pressure running a bit higher right now is likely due to the steroids that you are taking.   You may either stop the steroid Dosepak that you are on, or finish the taper over the next few days.  Regardless, your blood pressure running slightly high over this time and is perfectly safe.  You can take your antihypertensive as prescribed.  You may take all other medications that you have been prescribed.  I do recommend avoiding ibuprofen while you are on steroids.  Follow-up with your PCP.  Your blood work today was normal.

## 2024-02-13 NOTE — ED Triage Notes (Signed)
 Pt. Stated, Ive had hypertension for 3 days after taking Prednisone for 3 days. I ve been off my BP medicine for 6 months due to diet and exercise. I have arthritis in my neck and was given Prednisone.

## 2024-02-17 DIAGNOSIS — M79602 Pain in left arm: Secondary | ICD-10-CM | POA: Diagnosis not present

## 2024-02-17 DIAGNOSIS — M542 Cervicalgia: Secondary | ICD-10-CM | POA: Diagnosis not present

## 2024-02-17 DIAGNOSIS — R531 Weakness: Secondary | ICD-10-CM | POA: Diagnosis not present

## 2024-02-17 DIAGNOSIS — M79601 Pain in right arm: Secondary | ICD-10-CM | POA: Diagnosis not present

## 2024-02-28 DIAGNOSIS — M542 Cervicalgia: Secondary | ICD-10-CM | POA: Diagnosis not present

## 2024-02-28 DIAGNOSIS — M79601 Pain in right arm: Secondary | ICD-10-CM | POA: Diagnosis not present

## 2024-02-28 DIAGNOSIS — M79602 Pain in left arm: Secondary | ICD-10-CM | POA: Diagnosis not present

## 2024-02-28 DIAGNOSIS — R531 Weakness: Secondary | ICD-10-CM | POA: Diagnosis not present

## 2024-03-05 DIAGNOSIS — M79601 Pain in right arm: Secondary | ICD-10-CM | POA: Diagnosis not present

## 2024-03-05 DIAGNOSIS — M542 Cervicalgia: Secondary | ICD-10-CM | POA: Diagnosis not present

## 2024-03-05 DIAGNOSIS — R531 Weakness: Secondary | ICD-10-CM | POA: Diagnosis not present

## 2024-03-05 DIAGNOSIS — M79602 Pain in left arm: Secondary | ICD-10-CM | POA: Diagnosis not present

## 2024-04-06 DIAGNOSIS — E782 Mixed hyperlipidemia: Secondary | ICD-10-CM | POA: Diagnosis not present

## 2024-04-06 DIAGNOSIS — I10 Essential (primary) hypertension: Secondary | ICD-10-CM | POA: Diagnosis not present

## 2024-06-03 DIAGNOSIS — R7401 Elevation of levels of liver transaminase levels: Secondary | ICD-10-CM | POA: Diagnosis not present

## 2024-06-03 DIAGNOSIS — I10 Essential (primary) hypertension: Secondary | ICD-10-CM | POA: Diagnosis not present

## 2024-06-03 DIAGNOSIS — E119 Type 2 diabetes mellitus without complications: Secondary | ICD-10-CM | POA: Diagnosis not present

## 2024-06-03 DIAGNOSIS — Z0001 Encounter for general adult medical examination with abnormal findings: Secondary | ICD-10-CM | POA: Diagnosis not present

## 2024-06-03 DIAGNOSIS — E663 Overweight: Secondary | ICD-10-CM | POA: Diagnosis not present

## 2024-06-03 DIAGNOSIS — E1142 Type 2 diabetes mellitus with diabetic polyneuropathy: Secondary | ICD-10-CM | POA: Diagnosis not present

## 2024-06-03 DIAGNOSIS — E782 Mixed hyperlipidemia: Secondary | ICD-10-CM | POA: Diagnosis not present

## 2024-07-08 DIAGNOSIS — Z1231 Encounter for screening mammogram for malignant neoplasm of breast: Secondary | ICD-10-CM | POA: Diagnosis not present

## 2024-07-08 DIAGNOSIS — Z01419 Encounter for gynecological examination (general) (routine) without abnormal findings: Secondary | ICD-10-CM | POA: Diagnosis not present

## 2024-07-08 DIAGNOSIS — Z6836 Body mass index (BMI) 36.0-36.9, adult: Secondary | ICD-10-CM | POA: Diagnosis not present

## 2024-07-08 DIAGNOSIS — E282 Polycystic ovarian syndrome: Secondary | ICD-10-CM | POA: Diagnosis not present

## 2024-08-19 DIAGNOSIS — E782 Mixed hyperlipidemia: Secondary | ICD-10-CM | POA: Diagnosis not present

## 2024-08-19 DIAGNOSIS — R7401 Elevation of levels of liver transaminase levels: Secondary | ICD-10-CM | POA: Diagnosis not present

## 2024-08-19 DIAGNOSIS — E1142 Type 2 diabetes mellitus with diabetic polyneuropathy: Secondary | ICD-10-CM | POA: Diagnosis not present

## 2024-08-19 DIAGNOSIS — Z0001 Encounter for general adult medical examination with abnormal findings: Secondary | ICD-10-CM | POA: Diagnosis not present

## 2024-08-19 DIAGNOSIS — E119 Type 2 diabetes mellitus without complications: Secondary | ICD-10-CM | POA: Diagnosis not present

## 2024-08-19 DIAGNOSIS — I10 Essential (primary) hypertension: Secondary | ICD-10-CM | POA: Diagnosis not present

## 2024-08-19 DIAGNOSIS — E663 Overweight: Secondary | ICD-10-CM | POA: Diagnosis not present

## 2024-09-16 DIAGNOSIS — E1142 Type 2 diabetes mellitus with diabetic polyneuropathy: Secondary | ICD-10-CM | POA: Diagnosis not present

## 2024-09-16 DIAGNOSIS — E119 Type 2 diabetes mellitus without complications: Secondary | ICD-10-CM | POA: Diagnosis not present

## 2024-09-16 DIAGNOSIS — I10 Essential (primary) hypertension: Secondary | ICD-10-CM | POA: Diagnosis not present

## 2024-09-16 DIAGNOSIS — E663 Overweight: Secondary | ICD-10-CM | POA: Diagnosis not present

## 2024-09-16 DIAGNOSIS — E782 Mixed hyperlipidemia: Secondary | ICD-10-CM | POA: Diagnosis not present

## 2024-09-30 DIAGNOSIS — E559 Vitamin D deficiency, unspecified: Secondary | ICD-10-CM | POA: Diagnosis not present

## 2024-09-30 DIAGNOSIS — G249 Dystonia, unspecified: Secondary | ICD-10-CM | POA: Diagnosis not present

## 2024-09-30 DIAGNOSIS — D72819 Decreased white blood cell count, unspecified: Secondary | ICD-10-CM | POA: Diagnosis not present

## 2024-09-30 DIAGNOSIS — G2582 Stiff-man syndrome: Secondary | ICD-10-CM | POA: Diagnosis not present

## 2024-09-30 DIAGNOSIS — G35A Relapsing-remitting multiple sclerosis: Secondary | ICD-10-CM | POA: Diagnosis not present

## 2024-10-07 ENCOUNTER — Other Ambulatory Visit: Payer: Self-pay | Admitting: Neurology

## 2024-10-07 DIAGNOSIS — G35D Multiple sclerosis, unspecified: Secondary | ICD-10-CM

## 2024-10-15 ENCOUNTER — Ambulatory Visit (HOSPITAL_COMMUNITY)
Admission: RE | Admit: 2024-10-15 | Discharge: 2024-10-15 | Disposition: A | Discharge status: Home / Self Care | Source: Ambulatory Visit | Attending: Neurology | Admitting: Neurology

## 2024-10-15 DIAGNOSIS — R200 Anesthesia of skin: Secondary | ICD-10-CM | POA: Diagnosis not present

## 2024-10-15 DIAGNOSIS — G35D Multiple sclerosis, unspecified: Secondary | ICD-10-CM | POA: Insufficient documentation

## 2024-10-15 DIAGNOSIS — R9082 White matter disease, unspecified: Secondary | ICD-10-CM | POA: Diagnosis not present

## 2024-10-15 DIAGNOSIS — R262 Difficulty in walking, not elsewhere classified: Secondary | ICD-10-CM | POA: Diagnosis not present

## 2024-10-20 ENCOUNTER — Telehealth: Payer: Self-pay

## 2024-10-20 NOTE — Progress Notes (Signed)
   10/20/2024  Patient ID: [name], female   DOB: Feb 22, 1976, 48 y.o.   MRN: [id number]  Contacted patient regarding referral for hypertension, diabetes, hyperlipidemia, and medication management from Catalina Bare, MD .   Appointment scheduled 10/23/24 at 1:00 pm.  Heather Factor, PharmD Clinical Pharmacist  [phone]

## 2024-10-22 ENCOUNTER — Emergency Department (HOSPITAL_COMMUNITY)
Admission: EM | Admit: 2024-10-22 | Discharge: 2024-10-22 | Disposition: A | Discharge status: Home / Self Care | Attending: Emergency Medicine | Admitting: Emergency Medicine

## 2024-10-22 ENCOUNTER — Emergency Department (HOSPITAL_COMMUNITY)

## 2024-10-22 ENCOUNTER — Encounter (HOSPITAL_COMMUNITY): Payer: Self-pay | Admitting: Emergency Medicine

## 2024-10-22 DIAGNOSIS — R042 Hemoptysis: Secondary | ICD-10-CM | POA: Insufficient documentation

## 2024-10-22 DIAGNOSIS — Z79620 Long term (current) use of immunosuppressive biologic: Secondary | ICD-10-CM | POA: Diagnosis not present

## 2024-10-22 DIAGNOSIS — R059 Cough, unspecified: Secondary | ICD-10-CM | POA: Diagnosis not present

## 2024-10-22 DIAGNOSIS — R0789 Other chest pain: Secondary | ICD-10-CM | POA: Diagnosis not present

## 2024-10-22 DIAGNOSIS — R079 Chest pain, unspecified: Secondary | ICD-10-CM | POA: Diagnosis not present

## 2024-10-22 DIAGNOSIS — K209 Esophagitis, unspecified without bleeding: Secondary | ICD-10-CM | POA: Diagnosis not present

## 2024-10-22 DIAGNOSIS — Z79899 Other long term (current) drug therapy: Secondary | ICD-10-CM | POA: Insufficient documentation

## 2024-10-22 DIAGNOSIS — R509 Fever, unspecified: Secondary | ICD-10-CM | POA: Diagnosis not present

## 2024-10-22 DIAGNOSIS — G35D Multiple sclerosis, unspecified: Secondary | ICD-10-CM | POA: Diagnosis present

## 2024-10-22 DIAGNOSIS — R051 Acute cough: Secondary | ICD-10-CM

## 2024-10-22 DIAGNOSIS — R0602 Shortness of breath: Secondary | ICD-10-CM | POA: Diagnosis present

## 2024-10-22 LAB — CBC
HCT: 40.2 % (ref 36.0–46.0)
Hemoglobin: 14 g/dL (ref 12.0–15.0)
MCH: 31.7 pg (ref 26.0–34.0)
MCHC: 34.8 g/dL (ref 30.0–36.0)
MCV: 91.2 fL (ref 80.0–100.0)
Platelets: 391 K/uL (ref 150–400)
RBC: 4.41 MIL/uL (ref 3.87–5.11)
RDW: 12.2 % (ref 11.5–15.5)
WBC: 10.7 K/uL — ABNORMAL HIGH (ref 4.0–10.5)
nRBC: 0 % (ref 0.0–0.2)

## 2024-10-22 LAB — BASIC METABOLIC PANEL WITH GFR
Anion gap: 12 (ref 5–15)
BUN: 9 mg/dL (ref 6–20)
CO2: 22 mmol/L (ref 22–32)
Calcium: 9.3 mg/dL (ref 8.9–10.3)
Chloride: 102 mmol/L (ref 98–111)
Creatinine, Ser: 0.75 mg/dL (ref 0.44–1.00)
GFR, Estimated: >60 mL/min (ref >60)
Glucose, Bld: 151 mg/dL — ABNORMAL HIGH (ref 70–99)
Potassium: 3.3 mmol/L — ABNORMAL LOW (ref 3.5–5.1)
Sodium: 136 mmol/L (ref 135–145)

## 2024-10-22 LAB — RESP PANEL BY RT-PCR (RSV, FLU A&B, COVID)  RVPGX2
Influenza A by PCR: NEGATIVE
Influenza B by PCR: NEGATIVE
Resp Syncytial Virus by PCR: NEGATIVE
SARS Coronavirus 2 by RT PCR: NEGATIVE

## 2024-10-22 LAB — MAGNESIUM: Magnesium: 2.1 mg/dL (ref 1.7–2.4)

## 2024-10-22 LAB — PRO BRAIN NATRIURETIC PEPTIDE: Pro Brain Natriuretic Peptide: <50 pg/mL (ref <300.0)

## 2024-10-22 MED ORDER — SUCRALFATE 1 G PO TABS: 1.0000 g | ORAL_TABLET | Freq: Three times a day (TID) | ORAL | 1 refills | Status: AC

## 2024-10-22 MED ORDER — PANTOPRAZOLE SODIUM 20 MG PO TBEC: 20.0000 mg | DELAYED_RELEASE_TABLET | Freq: Every day | ORAL | 0 refills | Status: AC

## 2024-10-22 MED ORDER — IOHEXOL 350 MG/ML SOLN
75.0000 mL | Freq: Once | INTRAVENOUS | Status: AC | PRN
  Administered 2024-10-22: 75 mL via INTRAVENOUS

## 2024-10-22 MED ORDER — DOXYCYCLINE HYCLATE 100 MG PO CAPS: 100.0000 mg | ORAL_CAPSULE | Freq: Two times a day (BID) | ORAL | 0 refills | Status: AC

## 2024-10-22 MED ORDER — BENZONATATE 100 MG PO CAPS: 100.0000 mg | ORAL_CAPSULE | Freq: Three times a day (TID) | ORAL | 0 refills | Status: AC

## 2024-10-22 NOTE — ED Provider Notes (Signed)
 [place] EMERGENCY DEPARTMENT AT [facility] Provider Note   CSN: [id number] Arrival date & time: 10/22/24  1255     Patient presents with: Cough and Hematemesis   [name] is a 48 y.o. female.   Patient with history of multiple sclerosis, received treatment 2 weeks ago (immunosuppressive therapy), started feeling sick about 1 week ago with cough and shortness of breath.  Patient reports cough productive of white sputum with swirls of blood.  She has a sharp pain when she coughs.  She states that she feels short of breath, even with minor activity such as walking.  She reports fever to 101 F last night.  She has had some soreness in the right neck.  She has sore throat and hoarse voice.  No lightheadedness or syncope.  No leg pain or swelling.  She denies history of blood clot but has a family history.  No nausea, vomiting, diarrhea.  Patient states that she is having difficulty lying flat because when she does she feels smothered.  Patient saw PCP a couple of days ago, was prescribed azithromycin and promethazine  cough syrup.  She has not seen improvement, she has taken 3 out of the 5 doses of antibiotics.       Prior to Admission medications   Medication Sig Start Date End Date Taking? Authorizing Provider  acetaminophen  (TYLENOL ) 325 MG tablet Take 650 mg by mouth every 6 (six) hours as needed for mild pain, fever or headache.    [provider]  albuterol  (PROVENTIL ) (2.5 MG/3ML) 0.083% nebulizer solution Take 3 mLs (2.5 mg total) by nebulization every 6 (six) hours as needed for wheezing or shortness of breath. 11/22/23   Randol Simmonds, MD  albuterol  (VENTOLIN  HFA) 108 [phone] Base) MCG/ACT inhaler Inhale 1-2 puffs into the lungs every 6 (six) hours as needed for wheezing or shortness of breath. 11/22/23   Randol Simmonds, MD  amLODipine (NORVASC) 2.5 MG tablet Take 2.5 mg by mouth daily. 11/16/23   [provider]  amphetamine-dextroamphetamine (ADDERALL)  20 MG tablet Take 20 mg by mouth daily as needed (to help focus/ADHD). 01/19/21   [provider]  atorvastatin (LIPITOR) 20 MG tablet Take 20 mg by mouth every morning.    [provider]  baclofen  (LIORESAL ) 10 MG tablet Take 1 tablet (10 mg total) by mouth 3 (three) times daily. Muscle spasm Patient taking differently: Take 10 mg by mouth 3 (three) times daily as needed (tremors/spasms). 01/30/18   Nida Manfredi Clayborne RAMAN, PA-C  benzonatate  (TESSALON ) 100 MG capsule Take 1 capsule (100 mg total) by mouth 3 (three) times daily as needed for cough. 11/22/23   Randol Simmonds, MD  clobetasol ointment (TEMOVATE) 0.05 % Apply 1 application topically 2 (two) times daily. For lichen planus 04/07/21   [provider]  diclofenac  sodium (VOLTAREN ) 1 % GEL Apply 4 g topically 4 (four) times daily. Patient taking differently: Apply 4 g topically 2 (two) times daily. 01/30/18   Oluwatobi Ruppe Clayborne RAMAN, PA-C  Erenumab-aooe (AIMOVIG, 140 MG DOSE,) 70 MG/ML SOAJ Inject 140 mg into the skin every 30 (thirty) days. On the 18th of each month    [provider]  gabapentin  (NEURONTIN ) 600 MG tablet TAKE 2 TABLETS 4 TIMES A DAY Patient taking differently: Take 1,200 mg by mouth 4 (four) times daily. 03/04/18   Jorell Agne Clayborne RAMAN, PA-C  hydrochlorothiazide  (HYDRODIURIL ) 25 MG tablet Take 25 mg by mouth every morning.    [provider]  HYDROcodone -acetaminophen  [facility]) [phone]  MG tablet Take 1 tablet by mouth every 6 (six) hours as needed for moderate pain.    [provider]  losartan  (COZAAR ) 50 MG tablet Take 50 mg by mouth every morning.    [provider]  omega-3 acid ethyl esters (LOVAZA) 1 g capsule Take 1 g by mouth 2 (two) times daily.    [provider]  omeprazole (PRILOSEC) 20 MG capsule Take 20 mg by mouth every morning. 12/16/20   [provider]  ondansetron  (ZOFRAN -ODT) 4 MG disintegrating tablet Take 1 tablet (4 mg total) by mouth every 8 (eight) hours as  needed for nausea or vomiting. 01/30/18   Loranda Mastel Clayborne RAMAN, PA-C  predniSONE  (DELTASONE ) 50 MG tablet Take 1 tablet (50 mg total) by mouth daily. 11/22/23   Randol Simmonds, MD  Probiotic Product (PROBIOTIC PO) Take 1 capsule by mouth every morning.    [provider]  traZODone (DESYREL) 50 MG tablet Take 50 mg by mouth at bedtime.    [provider]    Allergies: Toradol  [ketorolac  tromethamine ], Metformin  and related, Tramadol , and Dilaudid  [hydromorphone  hcl]    Review of Systems  Updated Vital Signs BP (!) 128/96 (BP Location: Left Arm)   Pulse 94   Temp 98.7 F (37.1 C) (Oral)   Resp 20   LMP 03/17/2012   SpO2 98%   Physical Exam Vitals and nursing note reviewed.  Constitutional:      General: She is not in acute distress.    Appearance: She is well-developed.  HENT:     Head: Normocephalic and atraumatic.     Right Ear: Tympanic membrane, ear canal and external ear normal.     Left Ear: Tympanic membrane, ear canal and external ear normal.     Nose: Nose normal.     Mouth/Throat:     Mouth: Mucous membranes are moist.     Comments: Posterior oropharynx appears clear. Eyes:     Conjunctiva/sclera: Conjunctivae normal.  Cardiovascular:     Rate and Rhythm: Normal rate and regular rhythm.     Heart sounds: No murmur heard. Pulmonary:     Effort: No respiratory distress.     Breath sounds: No wheezing, rhonchi or rales.     Comments: Oxygen saturation 98-100% on room air.  Patient without increased work of breathing or trouble breathing. Abdominal:     Palpations: Abdomen is soft.     Tenderness: There is no abdominal tenderness. There is no guarding or rebound.  Musculoskeletal:     Cervical back: Normal range of motion and neck supple.     Right lower leg: No edema.     Left lower leg: No edema.  Skin:    General: Skin is warm and dry.     Findings: No rash.  Neurological:     General: No focal deficit present.     Mental Status: She is alert.  Mental status is at baseline.     Motor: No weakness.  Psychiatric:        Mood and Affect: Mood normal.     (all labs ordered are listed, but only abnormal results are displayed) Labs Reviewed  BASIC METABOLIC PANEL WITH GFR - Abnormal; Notable for the following components:      Result Value   Potassium 3.3 (*)    Glucose, Bld 151 (*)    All other components within normal limits  CBC - Abnormal; Notable for the following components:   WBC 10.7 (*)    All  other components within normal limits  RESP PANEL BY RT-PCR (RSV, FLU A&B, COVID)  RVPGX2  PRO BRAIN NATRIURETIC PEPTIDE  MAGNESIUM    EKG: EKG Interpretation Date/Time:  Thursday October 22 2024 13:02:45 EST Ventricular Rate:  95 PR Interval:  148 QRS Duration:  93 QT Interval:  419 QTC Calculation: 527 R Axis:   52  Text Interpretation: Sinus rhythm Borderline T abnormalities, diffuse leads Prolonged QT interval Baseline wander in lead(s) I III aVL Confirmed by Rogelia Satterfield ([id number]) on 10/22/2024 1:49:59 PM  Radiology: CT Angio Chest PE W and/or Wo Contrast Result Date: 10/22/2024 CLINICAL DATA:  High probability for PE. Cough, fever and chest pain. EXAM: CT ANGIOGRAPHY CHEST WITH CONTRAST TECHNIQUE: Multidetector CT imaging of the chest was performed using the standard protocol during bolus administration of intravenous contrast. Multiplanar CT image reconstructions and MIPs were obtained to evaluate the vascular anatomy. RADIATION DOSE REDUCTION: This exam was performed according to the departmental dose-optimization program which includes automated exposure control, adjustment of the mA and/or kV according to patient size and/or use of iterative reconstruction technique. CONTRAST:  75mL OMNIPAQUE  IOHEXOL  350 MG/ML SOLN COMPARISON:  None Available. FINDINGS: Cardiovascular: Satisfactory opacification of the pulmonary arteries to the segmental level. No evidence of pulmonary embolism. Normal heart size. No pericardial  effusion. Mediastinum/Nodes: There are no enlarged mediastinal or hilar lymph nodes. There is diffuse esophageal wall thickening, more prominent distally. The visualized thyroid  gland is within normal limits. Lungs/Pleura: Lungs are clear. No pleural effusion or pneumothorax. Upper Abdomen: Cholecystectomy clips are present. Gastric band is in place. Musculoskeletal: No chest wall abnormality. No acute or significant osseous findings. Review of the MIP images confirms the above findings. IMPRESSION: 1. No evidence for pulmonary embolism. 2. Diffuse esophageal wall thickening, more prominent distally. Findings may be related to esophagitis. Consider follow-up with endoscopy. Electronically Signed   By: Greig Pique M.D.   On: 10/22/2024 17:11   DG Chest 2 View Result Date: 10/22/2024 CLINICAL DATA:  Cough and hemoptysis. EXAM: CHEST - 2 VIEW COMPARISON:  11/22/2023 FINDINGS: Lungs are hypoinflated and otherwise clear. Cardiomediastinal silhouette and remainder of the exam is unchanged. IMPRESSION: Hypoinflation without acute cardiopulmonary disease. Electronically Signed   By: Toribio Agreste M.D.   On: 10/22/2024 14:31     Procedures   Medications Ordered in the ED - No data to display  ED Course  Patient seen and examined. History obtained directly from patient.   Labs/EKG: Ordered CBC, BMP, BNP was ordered due to reported orthopnea.  COVID, flu, RSV testing.  Imaging: Ordered chest x-ray.  Medications/Fluids: None ordered  Most recent vital signs reviewed and are as follows: BP (!) 128/96 (BP Location: Left Arm)   Pulse 94   Temp 98.7 F (37.1 C) (Oral)   Resp 20   LMP 03/17/2012   SpO2 98%   Initial impression: Patient reported hemoptysis and cough in setting of what sounds like a respiratory illness.  Possible bronchitis.  On treatment for pneumonia.  She is immunosuppressed.  Will need to evaluate for PE as well.  If chest x-ray appears clear, will order chest CT to rule out PE and  also to look for occult pneumonia.  Patient looks like she feels well, but not particularly toxic and in no respiratory distress.  Oxygen saturations normal.  She has a heart rate in the 90s.  2:50 PM Reassessment performed. Patient appears stable.    Labs personally reviewed and interpreted including: CBC with minimally elevated white blood cell  count at 10.7 otherwise normal hemoglobin and otherwise unremarkable; BMP with potassium 3.3, glucose 151 otherwise unremarkable; BNP was less than 50.  EKG as above without arrhythmia, prolonged QT noted and patient is on azithromycin.  Imaging personally visualized and interpreted including: Chest x-ray, agree no infiltrate.  Reviewed pertinent lab work and imaging with patient at bedside. Questions answered.   Most current vital signs reviewed and are as follows: BP (!) 128/96 (BP Location: Left Arm)   Pulse 94   Temp 98.7 F (37.1 C) (Oral)   Resp 20   LMP 03/17/2012   SpO2 98%   Plan: As above, will proceed with CT imaging to evaluate for occult pneumonia, rule out PE as patient with chest pain, shortness of breath, hemoptysis.  Thus far workup is reassuring.  6:21 PM Reassessment performed. Patient appears stable, comfortable.  Imaging personally visualized and interpreted including: CTA chest without signs of occult pneumonia, PE.  Does suggest esophagitis.  Patient has been on PPI in the past, but not currently.  Will restart this.  Reviewed pertinent lab work and imaging with patient at bedside. Questions answered.   Most current vital signs reviewed and are as follows: BP (!) 122/90   Pulse 80   Temp 99 F (37.2 C)   Resp 16   LMP 03/17/2012   SpO2 100%   Plan: Discharge to home.   Prescriptions written for: Protonix , Carafate .  Will give Tessalon  for cough and discontinue azithromycin (prolonged Qtc) and placed on doxycycline  for persistent cough and bronchitis.  Other home care instructions discussed: Avoidance of  triggers, bland diet  ED return instructions discussed: Return with worsening chest pain, shortness of breath or trouble breathing, high fevers, new or worsening symptoms  Follow-up instructions discussed: Patient encouraged to follow-up with their PCP in 3 days.                                     Medical Decision Making Amount and/or Complexity of Data Reviewed Labs: ordered. Radiology: ordered.  Risk Prescription drug management.   Patient presented today for evaluation of chest pain, cough, hemoptysis.  She was evaluated for pneumonia with chest x-ray which appeared clear.  She has been on azithromycin.  EKG showed mildly prolonged QT so azithromycin will be discontinued.  Patient had CT imaging of the chest to evaluate for occult pneumonia or PE.  Fortunately this was negative.  She does have evidence of esophagitis which may be contributing to some of her pain.  This could also cause chronic cough and contribute to development of pneumonia.  Patient will be restarted on PPI and provided with Carafate .  Will continue to to control symptoms.  Encouraged outpatient follow-up.  Low concern for ACS, PE, pneumothorax.  The patient's vital signs, pertinent lab work and imaging were reviewed and interpreted as discussed in the ED course. Hospitalization was considered for further testing, treatments, or serial exams/observation. However as patient is well-appearing, has a stable exam, and reassuring studies today, I do not feel that they warrant admission at this time. This plan was discussed with the patient who verbalizes agreement and comfort with this plan and seems reliable and able to return to the Emergency Department with worsening or changing symptoms.        Final diagnoses:  Acute cough  Hemoptysis  Esophagitis    ED Discharge Orders  Ordered    pantoprazole  (PROTONIX ) 20 MG tablet  Daily        10/22/24 1754    benzonatate  (TESSALON ) 100 MG capsule  Every 8  hours        10/22/24 1754    sucralfate  (CARAFATE ) 1 g tablet  3 times daily with meals & bedtime        10/22/24 1754    doxycycline  (VIBRAMYCIN ) 100 MG capsule  2 times daily        10/22/24 1754               Desiderio Chew, PA-C 10/22/24 1823    Rogelia Jerilynn RAMAN, MD 10/23/24 [phone]

## 2024-10-22 NOTE — Discharge Instructions (Addendum)
 Please read and follow all provided instructions.  Your diagnoses today include:  1. Acute cough   2. Hemoptysis   3. Esophagitis     Tests performed today include: Complete blood cell count: Normal white blood cell count and red blood cell count Basic metabolic panel: Blood sugar slightly high Screening test for heart failure: Was normal COVID and flu testing was negative Chest x-ray did not show signs of pneumonia. CT scan of the chest did not show signs of blood clot or pneumonia, does show inflammation in the lower esophagus EKG showed prolonged QT Vital signs. See below for your results today.   Medications prescribed:  Doxycycline  - antibiotic for bronchitis, discontinue the azithromycin  You have been prescribed an antibiotic medicine: take the entire course of medicine even if you are feeling better. Stopping early can cause the antibiotic not to work.  Tessalon  Perles - cough suppressant medication  Pantoprazole  - stomach acid reducer  Carafate  - for stomach upset and to protect your stomach  Take any prescribed medications only as directed.  Home care instructions:  Follow any educational materials contained in this packet.  BE VERY CAREFUL not to take multiple medicines containing Tylenol  (also called acetaminophen ). Doing so can lead to an overdose which can damage your liver and cause liver failure and possibly death.   Follow-up instructions: Please follow-up with your primary care provider in the next 7 days for further evaluation of your symptoms.   Return instructions:  Please return to the Emergency Department if you experience worsening symptoms.  Return with high fever, worsening shortness of breath or trouble breathing, worsening chest pain Please return if you have any other emergent concerns.  Additional Information:  Your vital signs today were: BP (!) 128/99 (BP Location: Right Arm)   Pulse 79   Temp 99.4 F (37.4 C) (Oral)   Resp 16   LMP  03/17/2012   SpO2 100%  If your blood pressure (BP) was elevated above 135/85 this visit, please have this repeated by your doctor within one month. --------------

## 2024-10-22 NOTE — ED Triage Notes (Signed)
 Pt recently had treatment for MS and has since been having issues with shortness of breath and coughing up blood.

## 2024-11-03 NOTE — Progress Notes (Signed)
   11/03/2024  Patient ID: [name], female   DOB: [birth year], 48 y.o.   MRN: [id number]  Contacted patient regarding referral for hypertension, diabetes, hyperlipidemia, and medication management from Catalina Bare, MD .   Left a voicemail for the patient to return my call at her earliest convenience.   Heather Factor, PharmD Clinical Pharmacist  [phone]

## 2024-11-03 NOTE — Progress Notes (Signed)
   10/23/24  Patient ID: [name], female   DOB: [birth year], 48 y.o.   MRN: [id number]  Contacted patient regarding referral for hypertension, diabetes, hyperlipidemia, and medication management from Catalina Bare, MD .   Left a voicemail for the patient to return my call at her earliest convenience.   Heather Factor, PharmD Clinical Pharmacist  [phone]

## 2024-11-04 DIAGNOSIS — D84821 Immunodeficiency due to drugs: Secondary | ICD-10-CM | POA: Diagnosis not present

## 2024-11-04 DIAGNOSIS — D849 Immunodeficiency, unspecified: Secondary | ICD-10-CM | POA: Diagnosis not present

## 2024-11-04 DIAGNOSIS — Z7189 Other specified counseling: Secondary | ICD-10-CM | POA: Diagnosis not present

## 2024-11-04 DIAGNOSIS — D801 Nonfamilial hypogammaglobulinemia: Secondary | ICD-10-CM | POA: Diagnosis not present

## 2024-11-04 DIAGNOSIS — G35A Relapsing-remitting multiple sclerosis: Secondary | ICD-10-CM | POA: Diagnosis not present

## 2024-11-04 DIAGNOSIS — R5383 Other fatigue: Secondary | ICD-10-CM | POA: Diagnosis not present

## 2024-11-17 ENCOUNTER — Telehealth: Payer: Self-pay

## 2024-11-17 NOTE — Progress Notes (Signed)
° °  11/17/2024  Patient ID: [name], female   DOB: 1976-05-25, 48 y.o.   MRN: [id number]  Contacted patient regarding referral for hypertension, diabetes, hyperlipidemia, and medication management from Catalina Bare, MD .   Left patient a voicemail to return my call at their convenience.   A third unsuccesful telephone outreach was attempted today to contact the patient who was referred to the pharmacy for assistance with medication management. The Population Health team is pleased to engage with this patient at any time in the future upon receipt of referral and should he/she be interested in assistance from the [facility] team.   Heather Factor, PharmD Clinical Pharmacist  [phone]

## 2024-12-16 NOTE — Progress Notes (Signed)
 [name]                                          MRN: [id number]   12/16/2024   The [facility] Quality Team Specialist reviewed this patient medical record for the purposes of chart review for care gap closure. The following were reviewed: chart review for care gap closure-controlling blood pressure.    [facility] Quality Team
